# Patient Record
Sex: Male | Born: 1945 | Race: Black or African American | Hispanic: No | Marital: Married | Smoking: Former smoker
Health system: Southern US, Community
[De-identification: ages and names within clinical notes are randomized; demographics above are authoritative.]

## PROBLEM LIST (undated history)

## (undated) DIAGNOSIS — Z5189 Encounter for other specified aftercare: Secondary | ICD-10-CM

## (undated) DIAGNOSIS — N183 Chronic kidney disease, stage 3 unspecified: Secondary | ICD-10-CM

## (undated) DIAGNOSIS — Z87898 Personal history of other specified conditions: Secondary | ICD-10-CM

## (undated) DIAGNOSIS — J45909 Unspecified asthma, uncomplicated: Secondary | ICD-10-CM

## (undated) DIAGNOSIS — T7840XA Allergy, unspecified, initial encounter: Secondary | ICD-10-CM

## (undated) DIAGNOSIS — IMO0001 Reserved for inherently not codable concepts without codable children: Secondary | ICD-10-CM

## (undated) DIAGNOSIS — T8859XA Other complications of anesthesia, initial encounter: Secondary | ICD-10-CM

## (undated) DIAGNOSIS — K219 Gastro-esophageal reflux disease without esophagitis: Secondary | ICD-10-CM

## (undated) DIAGNOSIS — H269 Unspecified cataract: Secondary | ICD-10-CM

## (undated) DIAGNOSIS — C801 Malignant (primary) neoplasm, unspecified: Secondary | ICD-10-CM

## (undated) DIAGNOSIS — M1990 Unspecified osteoarthritis, unspecified site: Secondary | ICD-10-CM

## (undated) DIAGNOSIS — E291 Testicular hypofunction: Secondary | ICD-10-CM

## (undated) DIAGNOSIS — I10 Essential (primary) hypertension: Principal | ICD-10-CM

## (undated) DIAGNOSIS — T4145XA Adverse effect of unspecified anesthetic, initial encounter: Secondary | ICD-10-CM

## (undated) DIAGNOSIS — E119 Type 2 diabetes mellitus without complications: Secondary | ICD-10-CM

## (undated) HISTORY — DX: Encounter for other specified aftercare: Z51.89

## (undated) HISTORY — DX: Personal history of other specified conditions: Z87.898

## (undated) HISTORY — DX: Allergy, unspecified, initial encounter: T78.40XA

## (undated) HISTORY — DX: Essential (primary) hypertension: I10

## (undated) HISTORY — DX: Testicular hypofunction: E29.1

## (undated) HISTORY — DX: Unspecified cataract: H26.9

## (undated) HISTORY — DX: Reserved for inherently not codable concepts without codable children: IMO0001

## (undated) HISTORY — DX: Gastro-esophageal reflux disease without esophagitis: K21.9

## (undated) HISTORY — PX: PROSTATE BIOPSY: SHX241

## (undated) HISTORY — PX: COLONOSCOPY: SHX174

---

## 1995-10-07 HISTORY — PX: NASAL SINUS SURGERY: SHX719

## 1995-10-07 HISTORY — PX: INGUINAL HERNIA REPAIR: SUR1180

## 1998-03-19 ENCOUNTER — Emergency Department (HOSPITAL_COMMUNITY): Admission: AD | Admit: 1998-03-19 | Discharge: 1998-03-19 | Payer: Self-pay | Admitting: Emergency Medicine

## 1998-03-26 ENCOUNTER — Emergency Department (HOSPITAL_COMMUNITY): Admission: EM | Admit: 1998-03-26 | Discharge: 1998-03-26 | Payer: Self-pay | Admitting: Emergency Medicine

## 1998-04-14 ENCOUNTER — Emergency Department (HOSPITAL_COMMUNITY): Admission: EM | Admit: 1998-04-14 | Discharge: 1998-04-14 | Payer: Self-pay | Admitting: Emergency Medicine

## 2004-09-22 ENCOUNTER — Emergency Department (HOSPITAL_COMMUNITY): Admission: EM | Admit: 2004-09-22 | Discharge: 2004-09-22 | Payer: Self-pay | Admitting: Family Medicine

## 2005-05-11 ENCOUNTER — Emergency Department (HOSPITAL_COMMUNITY): Admission: EM | Admit: 2005-05-11 | Discharge: 2005-05-11 | Payer: Self-pay | Admitting: Emergency Medicine

## 2006-10-24 ENCOUNTER — Inpatient Hospital Stay (HOSPITAL_COMMUNITY): Admission: EM | Admit: 2006-10-24 | Discharge: 2006-10-25 | Payer: Self-pay | Admitting: Emergency Medicine

## 2006-10-24 IMAGING — CR DG CHEST 2V
2 series · 2 of 2 positions shown · non-contrast
Comparison: None

CLINICAL DATA: Left-sided numbness, chest pain

CHEST - 2 VIEW:

[view not recorded (1 of 2)]
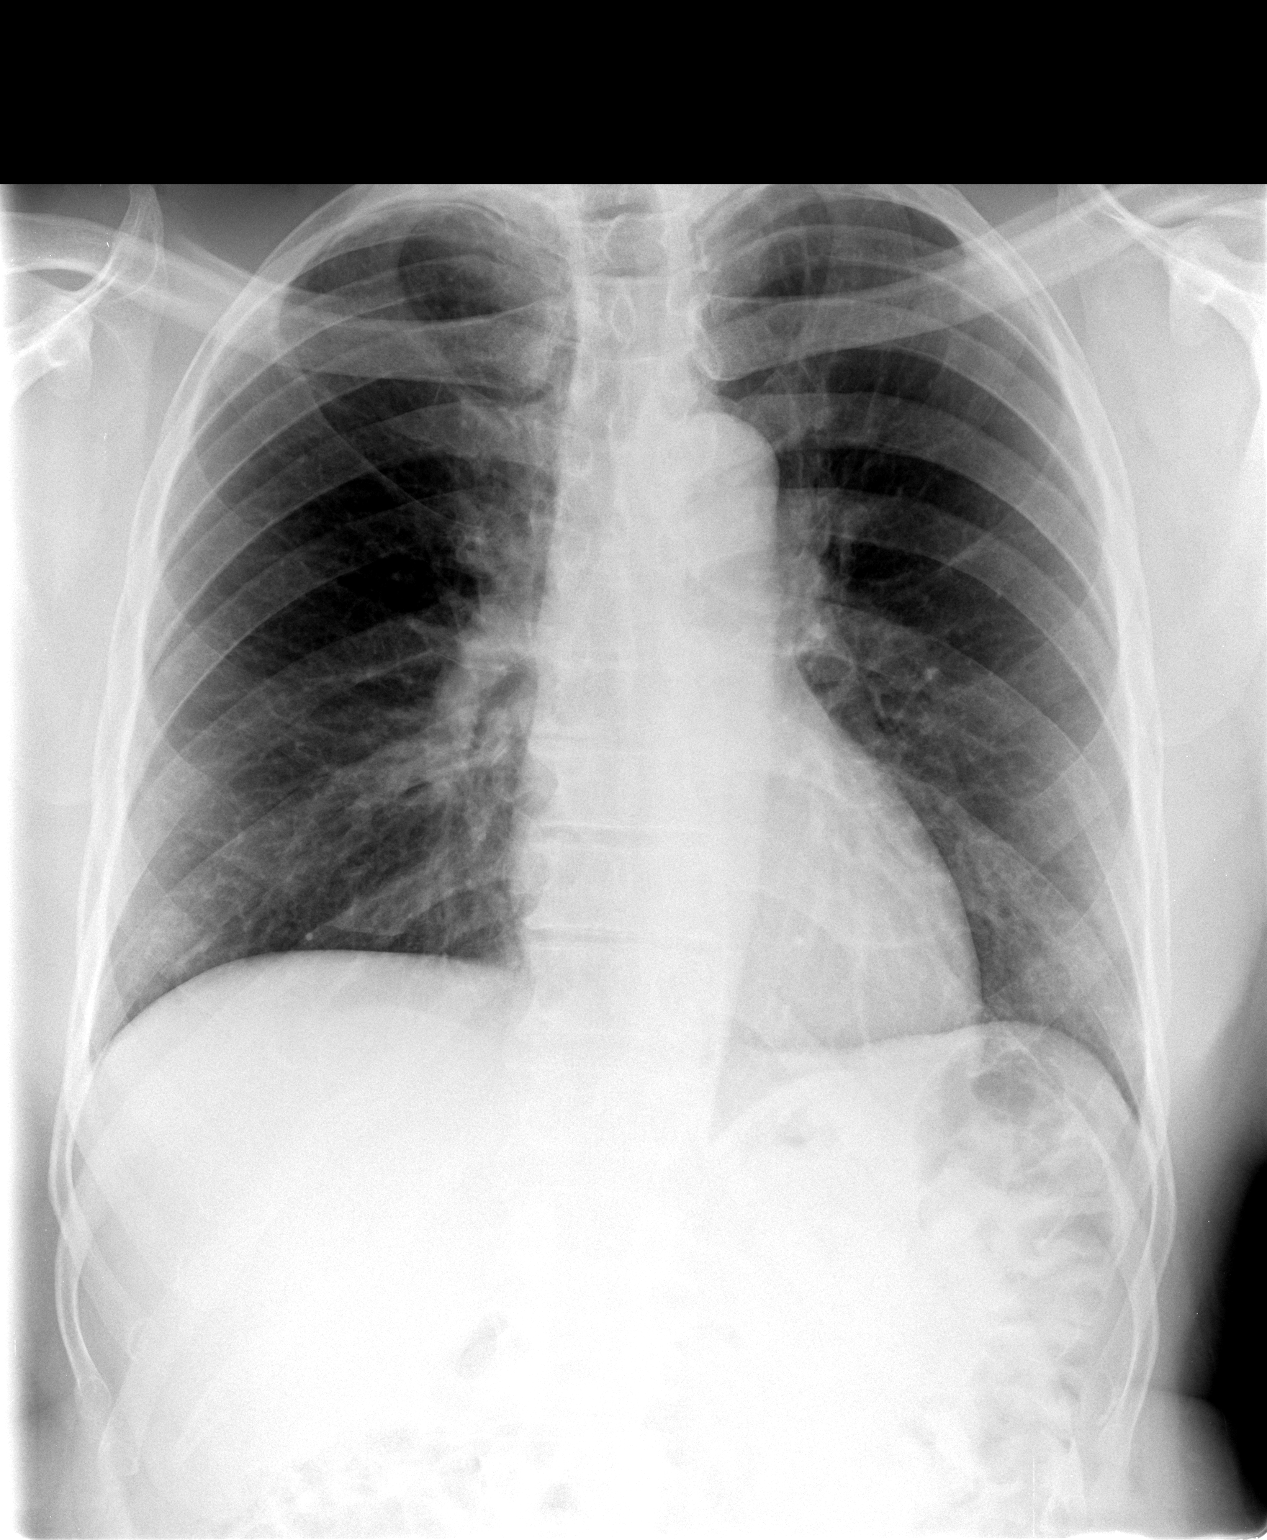

[view not recorded (2 of 2)]
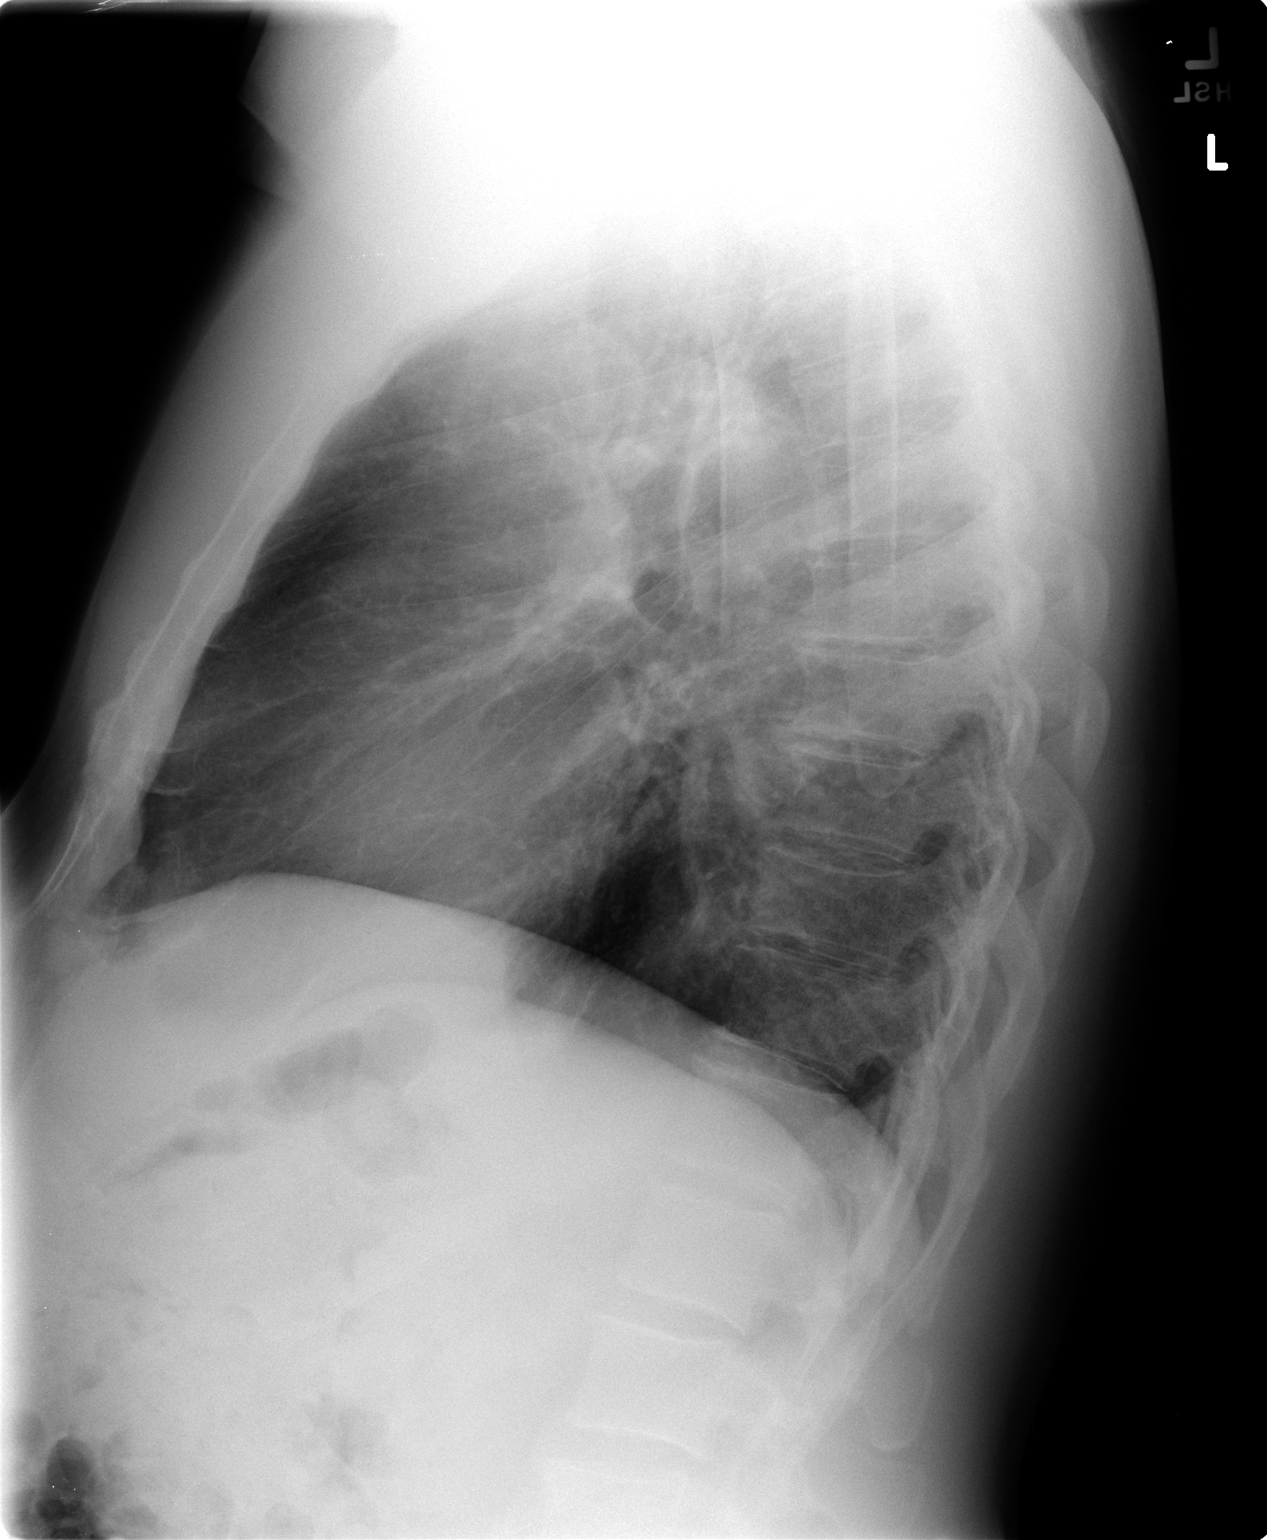

[2 of 2 positions shown; findings below may reference images not displayed]

FINDINGS: The heart size and mediastinal contours are within normal limits. 
Both lungs are clear.  The visualized skeletal structures are unremarkable.
IMPRESSION: No active cardiopulmonary disease

## 2007-12-13 ENCOUNTER — Encounter: Payer: Self-pay | Admitting: Family Medicine

## 2008-06-01 ENCOUNTER — Encounter: Payer: Self-pay | Admitting: Family Medicine

## 2008-07-10 ENCOUNTER — Emergency Department (HOSPITAL_COMMUNITY): Admission: EM | Admit: 2008-07-10 | Discharge: 2008-07-10 | Payer: Self-pay | Admitting: Emergency Medicine

## 2009-01-18 ENCOUNTER — Ambulatory Visit: Payer: Self-pay | Admitting: Family Medicine

## 2009-01-18 DIAGNOSIS — K219 Gastro-esophageal reflux disease without esophagitis: Secondary | ICD-10-CM | POA: Insufficient documentation

## 2009-01-18 DIAGNOSIS — E291 Testicular hypofunction: Secondary | ICD-10-CM

## 2009-01-18 DIAGNOSIS — I10 Essential (primary) hypertension: Secondary | ICD-10-CM | POA: Insufficient documentation

## 2009-01-18 DIAGNOSIS — J45909 Unspecified asthma, uncomplicated: Secondary | ICD-10-CM | POA: Insufficient documentation

## 2009-01-18 DIAGNOSIS — R7989 Other specified abnormal findings of blood chemistry: Secondary | ICD-10-CM | POA: Insufficient documentation

## 2009-01-18 HISTORY — DX: Testicular hypofunction: E29.1

## 2009-01-18 HISTORY — DX: Essential (primary) hypertension: I10

## 2009-01-18 HISTORY — DX: Gastro-esophageal reflux disease without esophagitis: K21.9

## 2009-01-18 LAB — CONVERTED CEMR LAB
Nitrite: NEGATIVE
Protein, U semiquant: NEGATIVE
Specific Gravity, Urine: 1.01

## 2009-02-08 ENCOUNTER — Ambulatory Visit: Payer: Self-pay | Admitting: Family Medicine

## 2009-02-08 LAB — CONVERTED CEMR LAB
Nitrite: NEGATIVE
Urobilinogen, UA: 1
WBC Urine, dipstick: NEGATIVE

## 2009-03-01 ENCOUNTER — Encounter: Payer: Self-pay | Admitting: Family Medicine

## 2009-04-04 ENCOUNTER — Ambulatory Visit: Payer: Self-pay | Admitting: Family Medicine

## 2009-04-04 DIAGNOSIS — Z87898 Personal history of other specified conditions: Secondary | ICD-10-CM

## 2009-04-04 DIAGNOSIS — N400 Enlarged prostate without lower urinary tract symptoms: Secondary | ICD-10-CM | POA: Insufficient documentation

## 2009-04-04 HISTORY — DX: Personal history of other specified conditions: Z87.898

## 2009-04-05 ENCOUNTER — Telehealth: Payer: Self-pay | Admitting: Family Medicine

## 2009-04-06 ENCOUNTER — Encounter: Payer: Self-pay | Admitting: Family Medicine

## 2009-04-10 ENCOUNTER — Telehealth (INDEPENDENT_AMBULATORY_CARE_PROVIDER_SITE_OTHER): Payer: Self-pay | Admitting: *Deleted

## 2009-04-21 IMAGING — CR DG FINGER MIDDLE 2+V*R*
3 series · 3 of 3 positions shown · non-contrast
Comparison: None.

CLINICAL DATA: Laceration middle finger.

RIGHT MIDDLE FINGER 2+V

[x finger pa right]
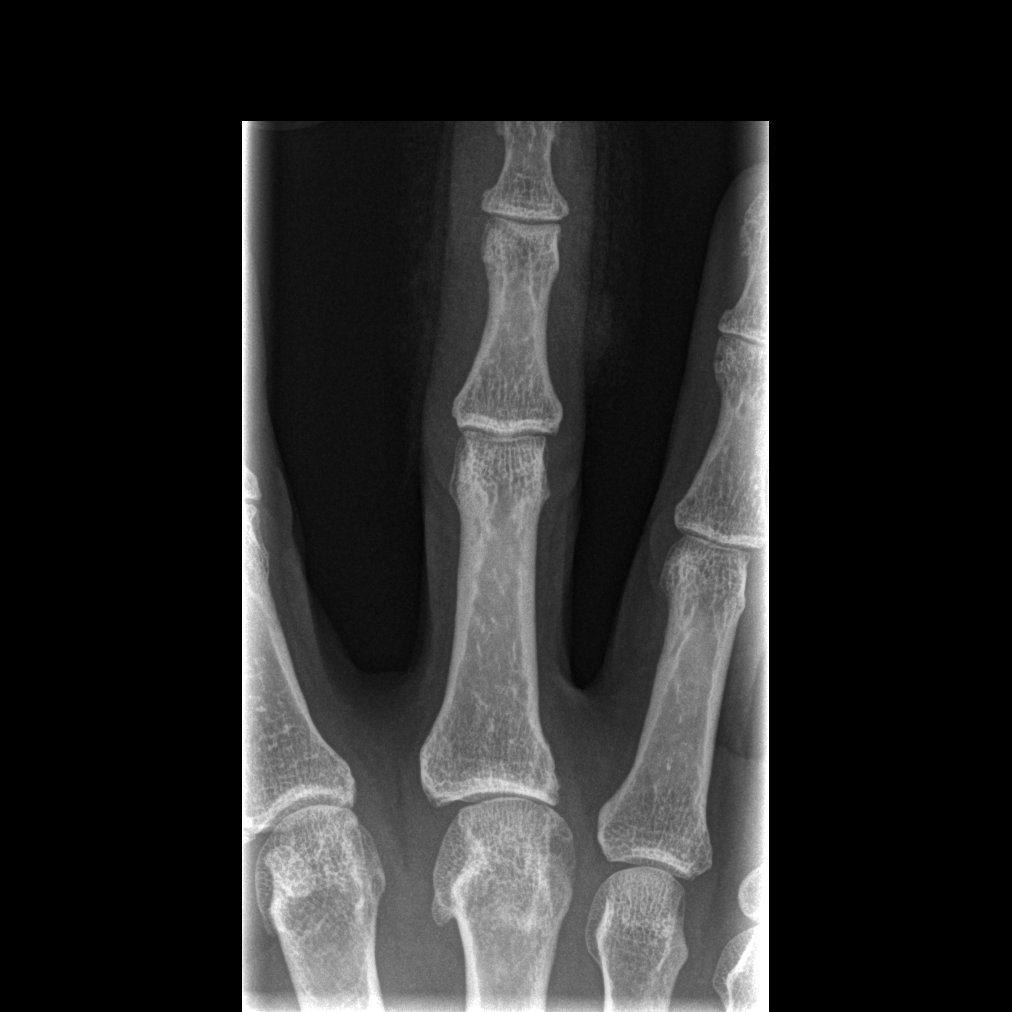

[x finger obl. right]
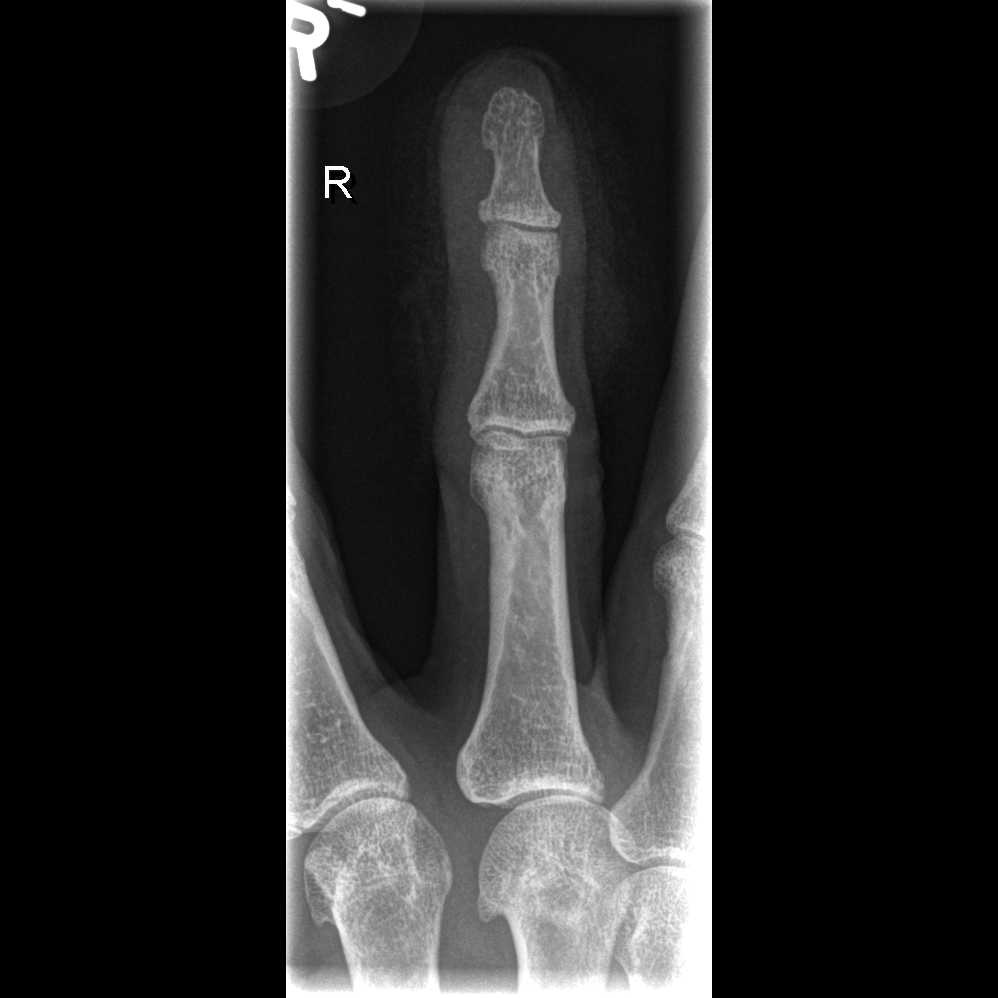

[x finger lateral right]
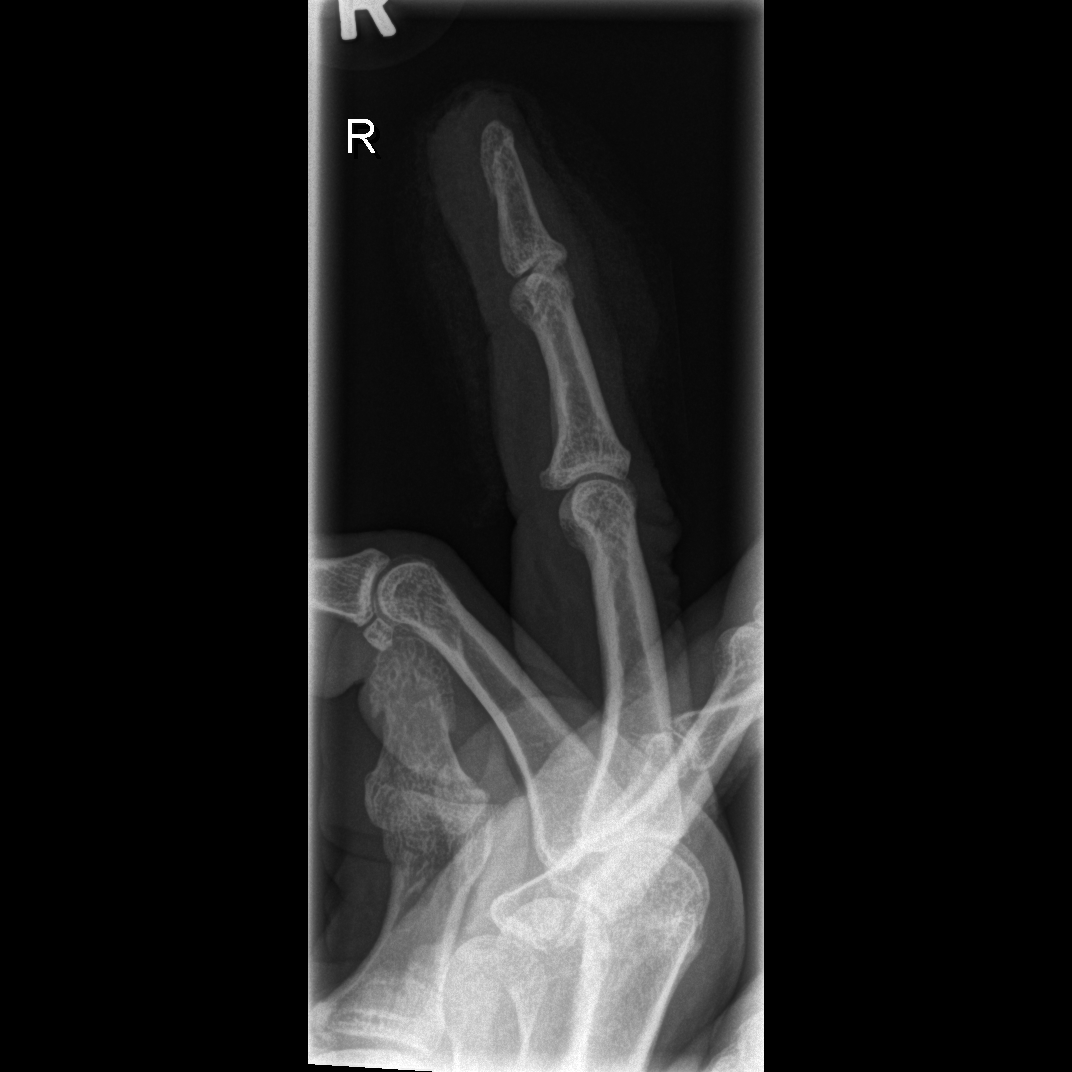

[3 of 3 positions shown; findings below may reference images not displayed]

FINDINGS: Three-view exam shows no evidence for acute fracture.  No
subluxation or dislocation.  Overlying bandage material is evident.
IMPRESSION: No acute bony findings.

## 2009-07-02 ENCOUNTER — Ambulatory Visit: Payer: Self-pay | Admitting: Family Medicine

## 2009-07-03 ENCOUNTER — Encounter: Payer: Self-pay | Admitting: Family Medicine

## 2009-07-20 ENCOUNTER — Ambulatory Visit: Payer: Self-pay | Admitting: Family Medicine

## 2009-07-25 ENCOUNTER — Telehealth (INDEPENDENT_AMBULATORY_CARE_PROVIDER_SITE_OTHER): Payer: Self-pay | Admitting: *Deleted

## 2009-08-20 ENCOUNTER — Ambulatory Visit: Payer: Self-pay | Admitting: Family Medicine

## 2009-09-10 ENCOUNTER — Telehealth: Payer: Self-pay | Admitting: Family Medicine

## 2009-09-11 ENCOUNTER — Ambulatory Visit: Payer: Self-pay | Admitting: Family Medicine

## 2009-10-09 ENCOUNTER — Encounter: Payer: Self-pay | Admitting: Family Medicine

## 2010-06-03 ENCOUNTER — Telehealth: Payer: Self-pay | Admitting: Family Medicine

## 2010-09-03 ENCOUNTER — Ambulatory Visit: Payer: Self-pay | Admitting: Family Medicine

## 2010-09-03 DIAGNOSIS — J309 Allergic rhinitis, unspecified: Secondary | ICD-10-CM | POA: Insufficient documentation

## 2010-09-06 ENCOUNTER — Telehealth: Payer: Self-pay | Admitting: Family Medicine

## 2010-09-14 ENCOUNTER — Emergency Department (HOSPITAL_COMMUNITY)
Admission: EM | Admit: 2010-09-14 | Discharge: 2010-09-14 | Disposition: A | Discharge status: Other Acute Inpatient Hospital | Payer: Self-pay | Source: Home / Self Care | Admitting: Family Medicine

## 2010-09-14 ENCOUNTER — Emergency Department (HOSPITAL_COMMUNITY)
Admission: EM | Admit: 2010-09-14 | Discharge: 2010-09-14 | Payer: Self-pay | Source: Home / Self Care | Admitting: Emergency Medicine

## 2010-09-16 ENCOUNTER — Emergency Department (HOSPITAL_COMMUNITY)
Admission: EM | Admit: 2010-09-16 | Discharge: 2010-09-16 | Payer: Self-pay | Source: Home / Self Care | Admitting: Emergency Medicine

## 2010-09-20 ENCOUNTER — Ambulatory Visit: Payer: Self-pay | Admitting: Family Medicine

## 2010-09-25 ENCOUNTER — Emergency Department (HOSPITAL_COMMUNITY)
Admission: EM | Admit: 2010-09-25 | Discharge: 2010-09-25 | Payer: Self-pay | Source: Home / Self Care | Admitting: Family Medicine

## 2010-10-09 ENCOUNTER — Ambulatory Visit
Admission: RE | Admit: 2010-10-09 | Discharge: 2010-10-09 | Payer: Self-pay | Source: Home / Self Care | Attending: Family Medicine | Admitting: Family Medicine

## 2010-10-09 DIAGNOSIS — J310 Chronic rhinitis: Secondary | ICD-10-CM | POA: Insufficient documentation

## 2010-10-09 DIAGNOSIS — R05 Cough: Secondary | ICD-10-CM | POA: Insufficient documentation

## 2010-10-09 DIAGNOSIS — R059 Cough, unspecified: Secondary | ICD-10-CM | POA: Insufficient documentation

## 2010-10-09 IMAGING — CR DG CHEST 2V
2 series · 2 of 2 positions shown · non-contrast
Comparison: Chest x-ray [DATE].

CLINICAL DATA: Cough.

CHEST - 2 VIEW

[view not recorded (1 of 2)]
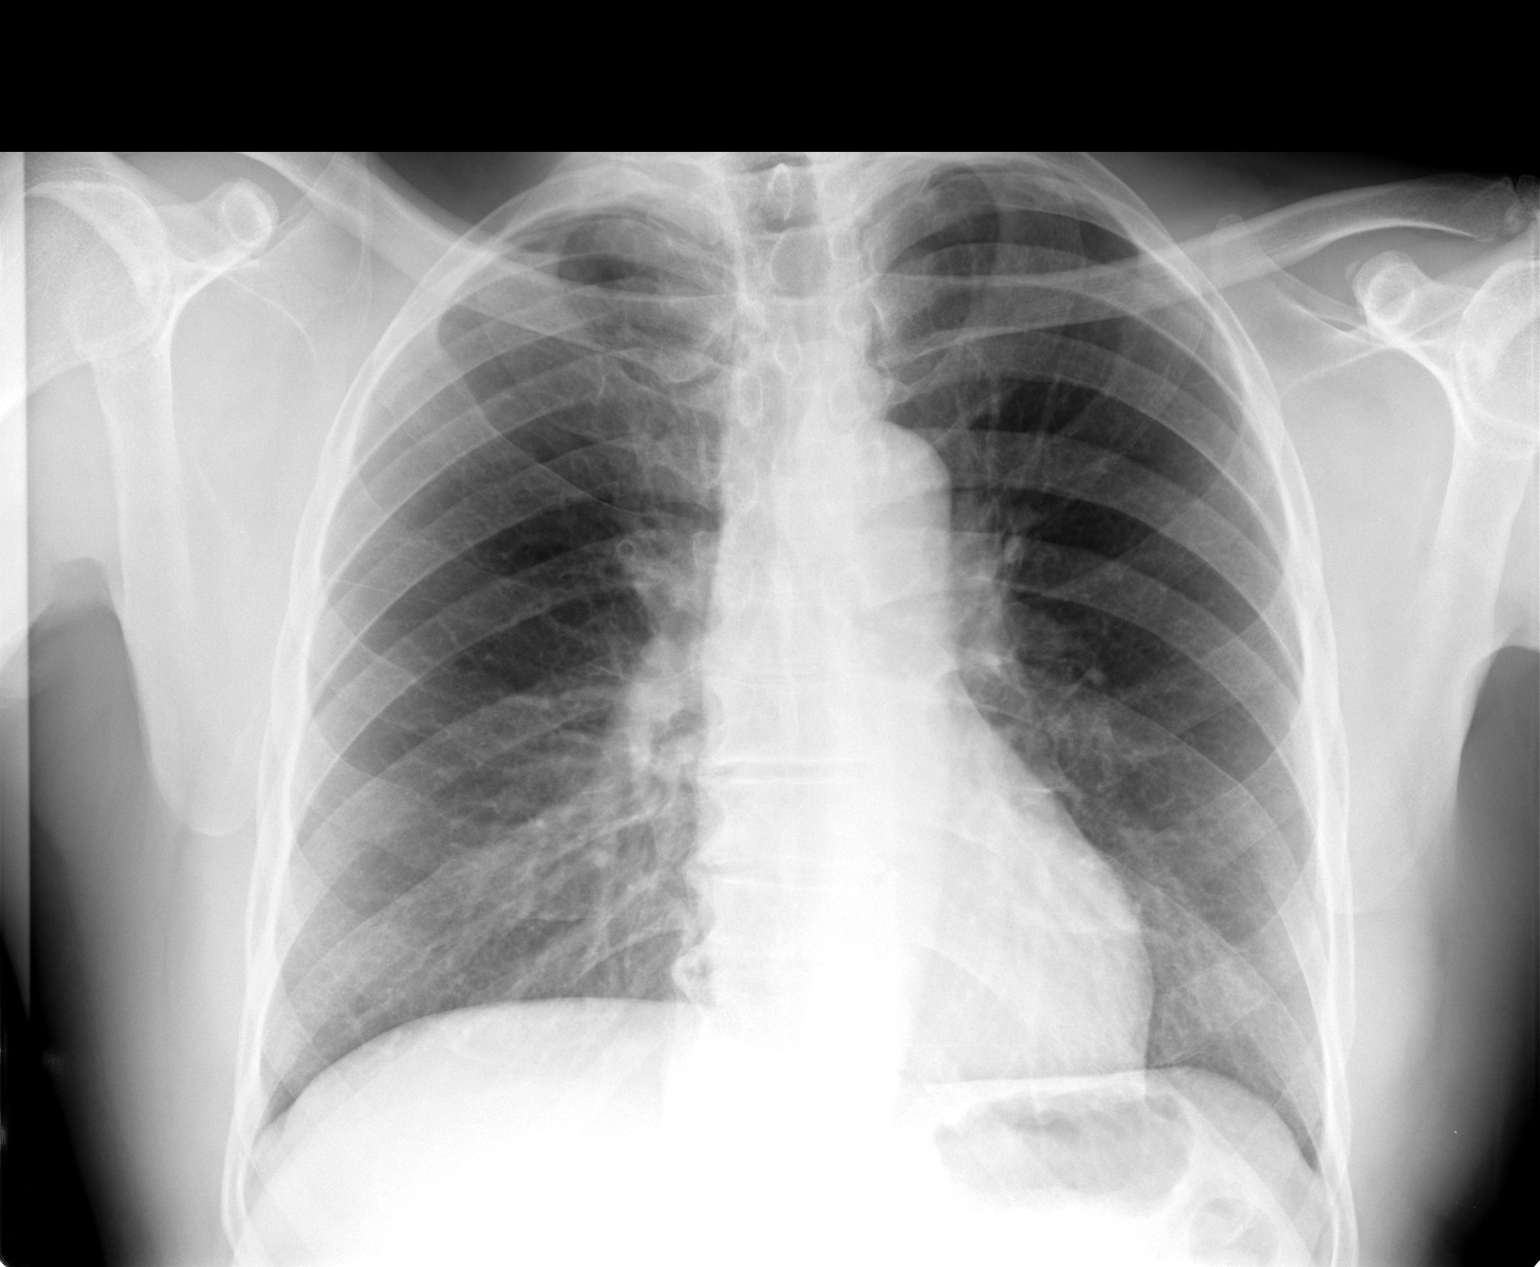

[view not recorded (2 of 2)]
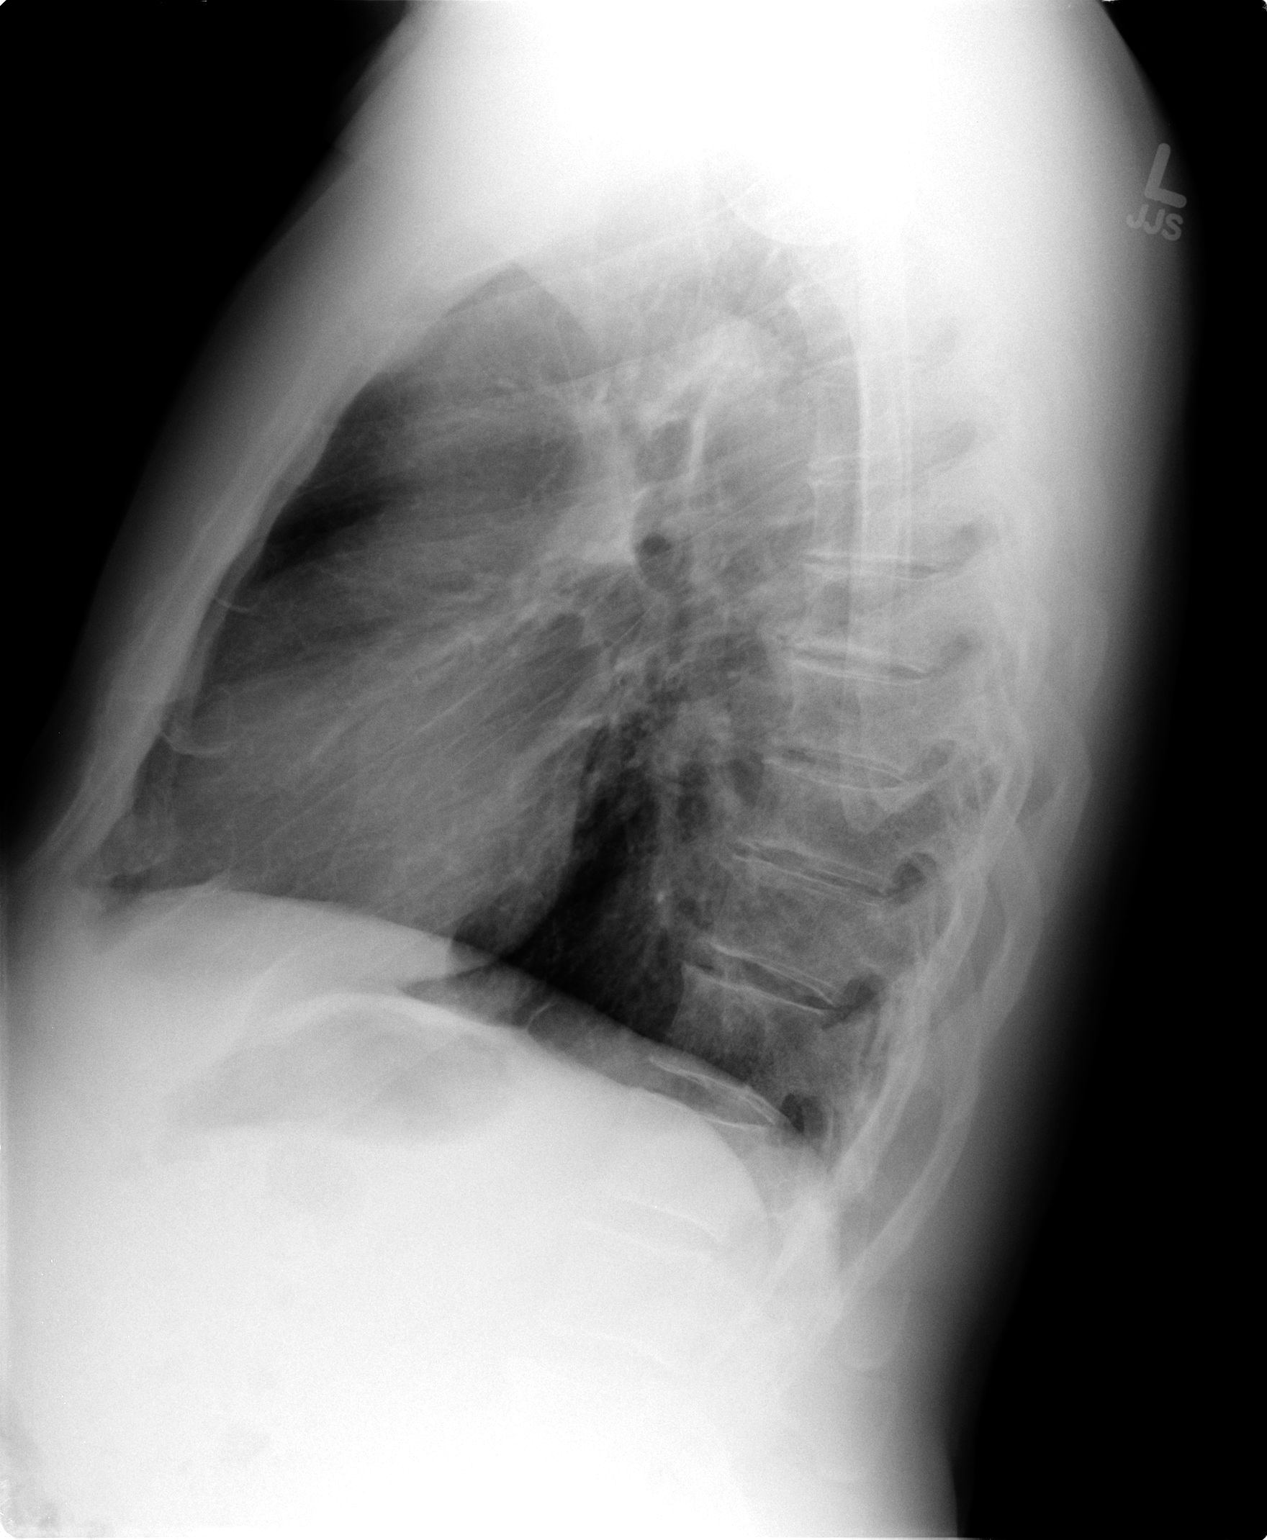

[2 of 2 positions shown; findings below may reference images not displayed]

FINDINGS: The cardiac silhouette, mediastinal and hilar contours
are within normal limits.  The lungs are clear.  No pleural
effusion.  The bony thorax is intact.
IMPRESSION: No acute cardiopulmonary findings.

## 2010-10-21 ENCOUNTER — Ambulatory Visit: Payer: Self-pay | Admitting: Cardiology

## 2010-10-21 IMAGING — CT CT PARANASAL SINUSES LIMITED
1 series · 15 of 30 positions shown, 19 images · non-contrast
Comparison: None.

CLINICAL DATA: 65-year-old male with loss of smell and face.  Sinus
pressure times 3 months.

CT PARANASAL SINUS LIMITED WITHOUT CONTRAST
TECHNIQUE: Multidetector CT images of the paranasal sinuses were
obtained in a single plane without contrast.

[Series 3: ltd sinus 3.0 h30s · axial · 0.23mm/px · z∈[-88,+38]mm · 15 of 30 slices shown, 19 images]
[im 2/30  brain]
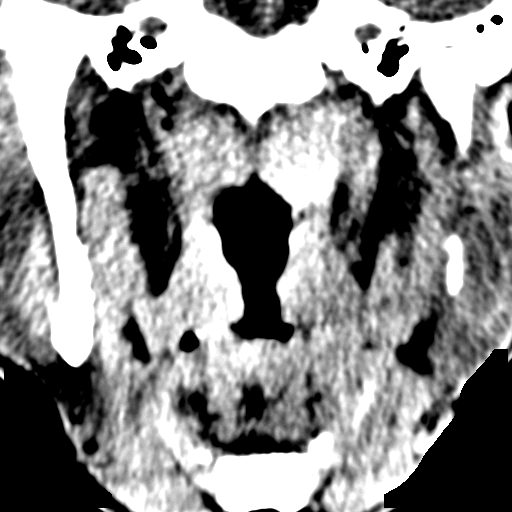
[im 2/30  bone]
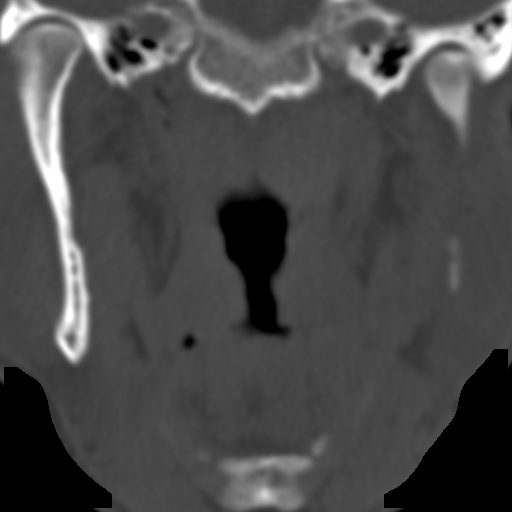
[im 4/30  bone]
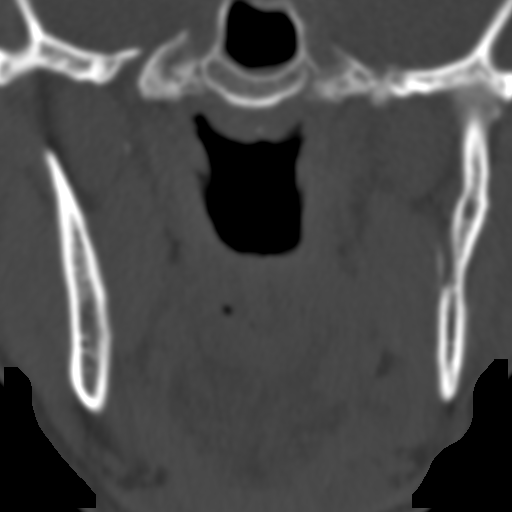
[im 6/30  bone]
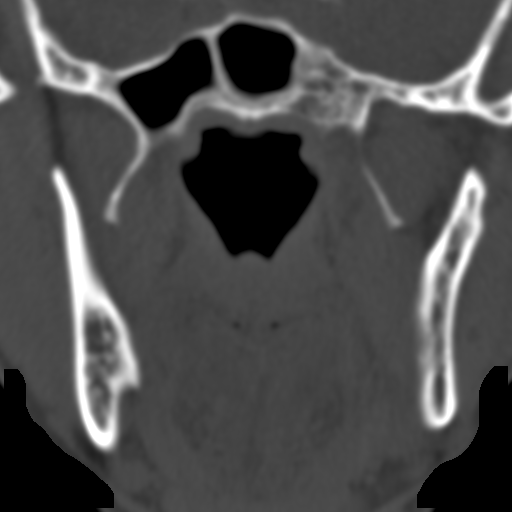
[im 8/30  bone]
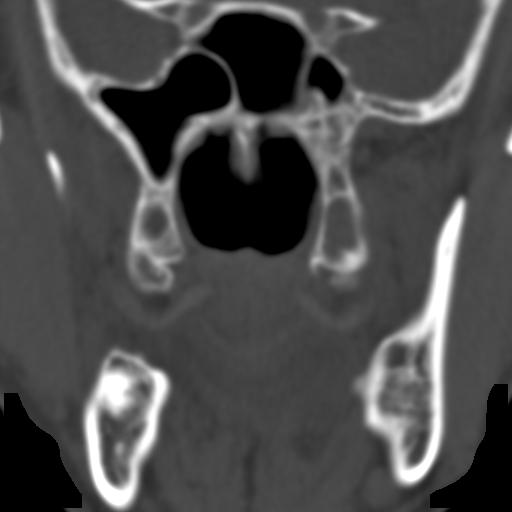
[im 10/30  brain]
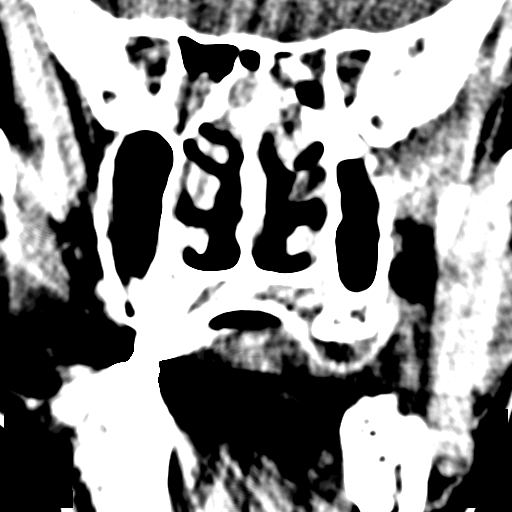
[im 10/30  bone]
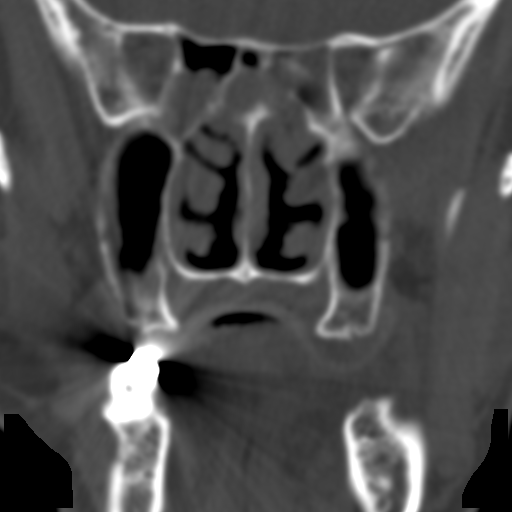
[im 12/30  bone]
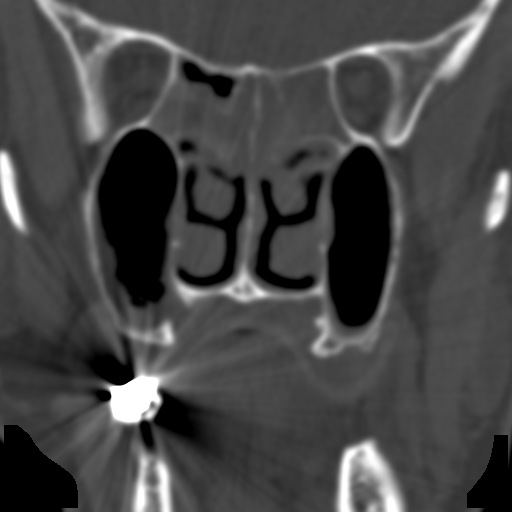
[im 14/30  bone]
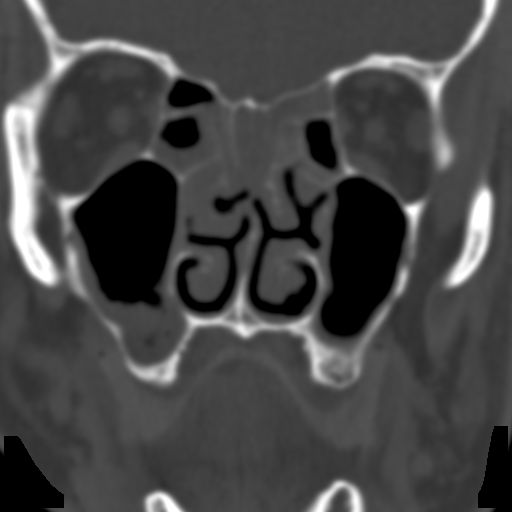
[im 16/30  bone]
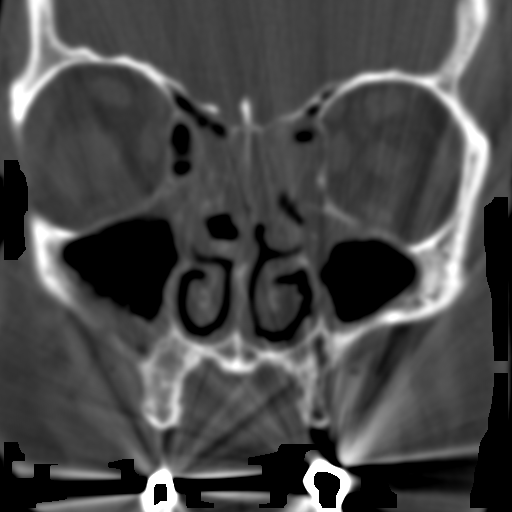
[im 17/30  brain]
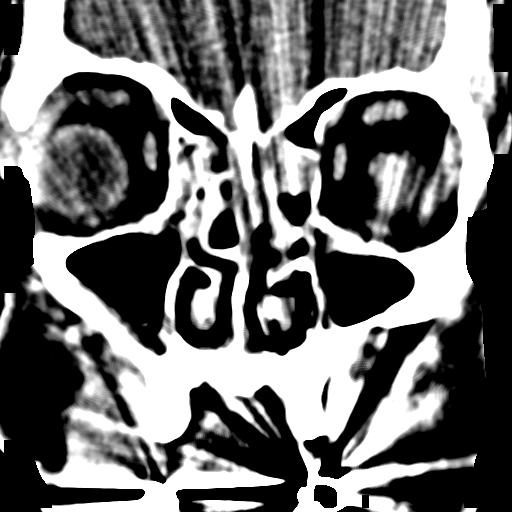
[im 17/30  bone]
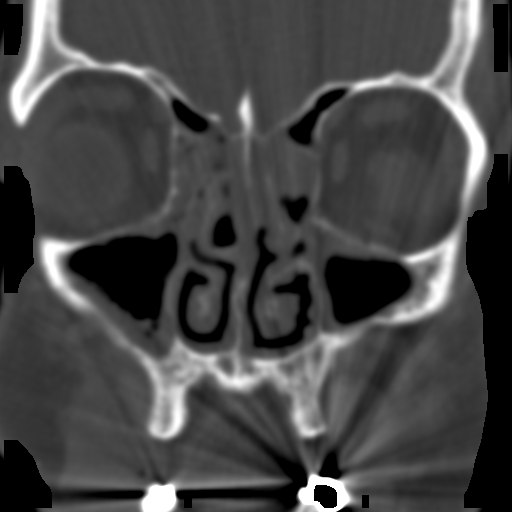
[im 19/30  bone]
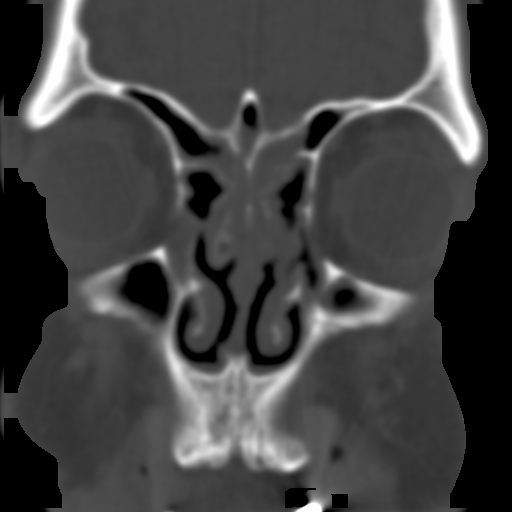
[im 21/30  bone]
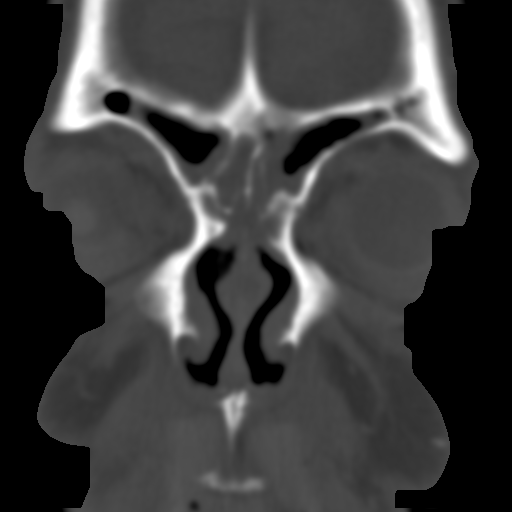
[im 23/30  bone]
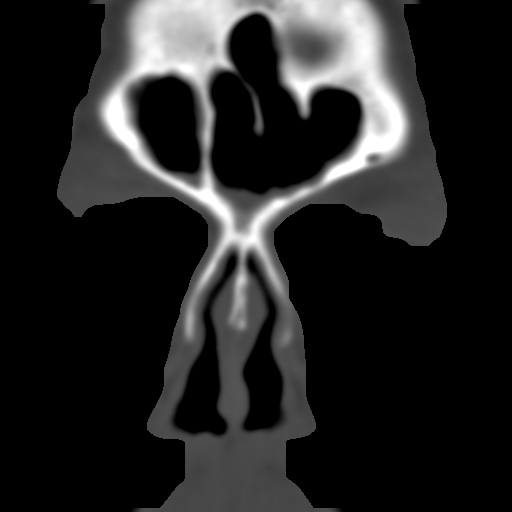
[im 25/30  brain]
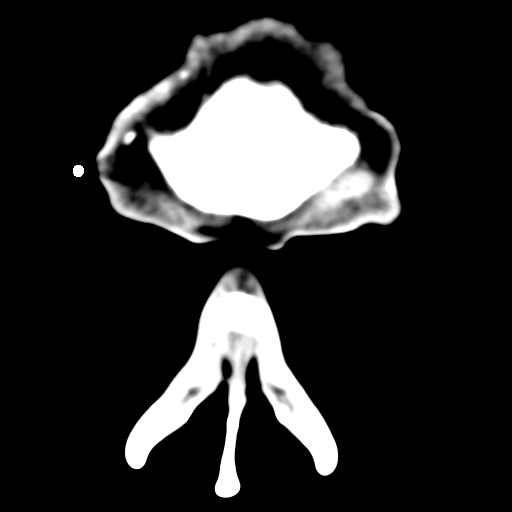
[im 25/30  bone]
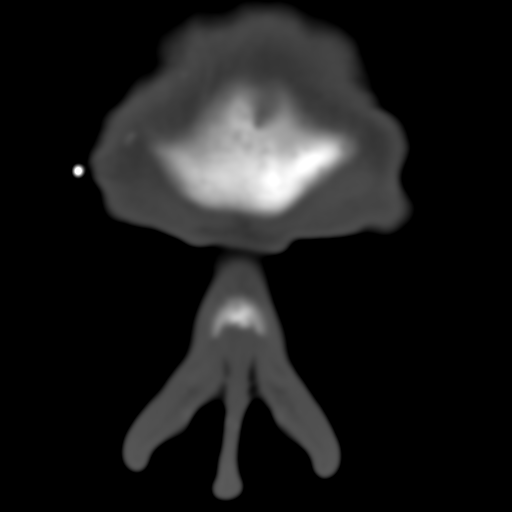
[im 27/30  bone]
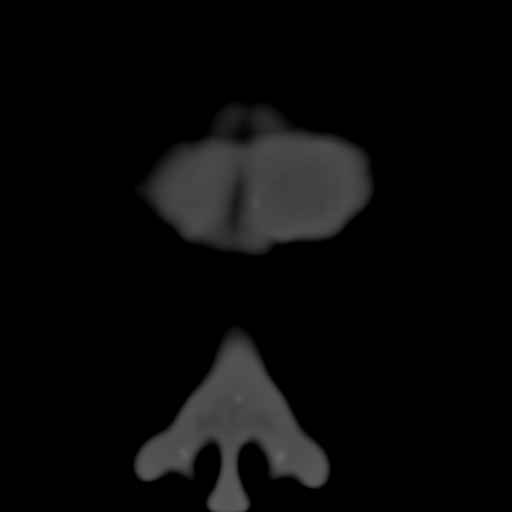
[im 29/30  bone]
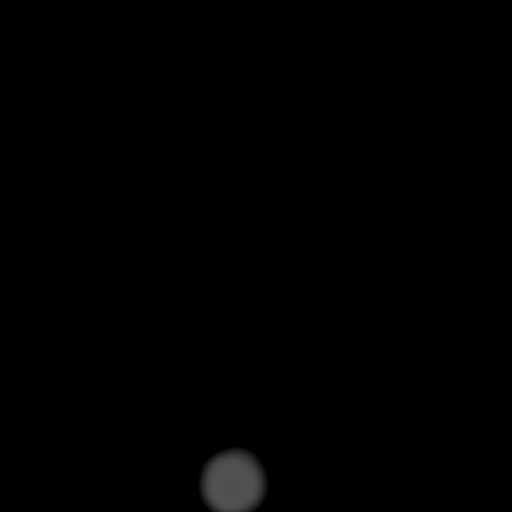

[15 of 30 positions shown; findings below may reference images not displayed]

FINDINGS: There is bubbly opacity and mucosal thickening in the
right maxillary sinus.  There is mucosal thickening in the left
maxillary sinus.  There is minimal mucosal thickening in the
sphenoid sinuses.  There is bubbly opacity and mucosal thickening
in the left frontal sinus.  There is mucosal thickening in the
right frontal sinus.

The ethmoid air cells are largely opacified throughout.  There is
opacification  of the bilateral olfactory recesses.

The nasal cavity is within normal limits.  Visualized deep and
superficial face soft tissues are within normal limits.

No acute osseous abnormality identified.  Grossly negative
visualized noncontrast brain parenchyma.
IMPRESSION: Diffuse paranasal sinus inflammatory changes with bubbly opacity in
the left frontal and right maxillary sinuses suggestive of acute
sinusitis.  The appearance may reflect acute on chronic sinus
disease.

## 2010-10-22 ENCOUNTER — Telehealth: Payer: Self-pay | Admitting: Family Medicine

## 2010-10-23 ENCOUNTER — Telehealth (INDEPENDENT_AMBULATORY_CARE_PROVIDER_SITE_OTHER): Payer: Self-pay | Admitting: *Deleted

## 2010-10-26 ENCOUNTER — Encounter: Payer: Self-pay | Admitting: Urology

## 2010-11-05 NOTE — Progress Notes (Signed)
Summary: Rx chg request  Phone Note Call from Patient Call back at Work Phone [phone]   Caller: Patient Call For: Carolann Littler MD Summary of Call: VM from pt requesting a change from Viagra to [place] Initial call taken by: Nira Conn LPN,  August 29, 624THL 1:32 PM  Follow-up for Phone Call        OK to change to Levitra 20 mg one by mouth once daily as needed #6 with 6 refills. Follow-up by: Carolann Littler MD,  June 03, 2010 3:22 PM  Additional Follow-up for Phone Call Additional follow up Details #1::        Rx sent, pt informed Additional Follow-up by: Nira Conn LPN,  August 29, 624THL 4:33 PM    New/Updated Medications: LEVITRA 20 MG TABS (VARDENAFIL HCL) as needed LEVITRA 20 MG TABS (VARDENAFIL HCL) one tab by mouth daily as needed Prescriptions: LEVITRA 20 MG TABS (VARDENAFIL HCL) as needed  #6 x 6   Entered by:   Nira Conn LPN   Authorized by:   Carolann Littler MD   Signed by:   Nira Conn LPN on [id number]   Method used:   Electronically to        [facility]  [phone]* (retail)       [address]       [place], [place]  [address]       Ph: [phone] or [phone]       Fax: [phone]   RxID:   [id number]

## 2010-11-05 NOTE — Consult Note (Signed)
Summary: [facility]  [facility]   Imported By: Laural Benes 10/15/2009 10:40:51  _____________________________________________________________________  External Attachment:    Type:   Image     Comment:   External Document

## 2010-11-05 NOTE — Assessment & Plan Note (Signed)
Summary: MEDICATION CONCERNS / TINEA PEDIS (ATHLETES FOOT) // RS   Vital Signs:  Patient profile:   65 year old male Weight:      198 pounds O2 Sat:      96 % on Room air Temp:     98.5 degrees F oral BP sitting:   158 / 92  (left arm) Cuff size:   regular  Vitals Entered By: Nira Conn LPN (November 29, 624THL 11:16 AM)  O2 Flow:  Room air CC: BP running high, SOB   History of Present Illness: Patient seen for the following concerns:  Hypertension poorly controlled with lisinopril. Previously on hydrochlorothiazide but had potential side effect. He is reluctant to go back on that. Blood pressures ranging [id number] range systolic and 0000000 diastolic. No headaches or dizziness. No chest pain.  Frequent nasal congestive symptoms for several weeks with loss of smell and taste.  Freq. postnasal drip symptoms. No purlulent secretions.  History of BPH. Followed by urology with prior history elevated PSA.  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  Otoscopic examination reveals clear canals, tympanic membranes are intact bilaterally without bulging, retraction, inflammation or discharge. Hearing is grossly normal bilaterally. Nose:  swollen nasal mucosa bilaterally. No visible polyps Mouth:  Oral mucosa and  oropharynx without lesions or exudates.  Teeth in good repair. Neck:  No deformities, masses, or tenderness noted. Lungs:  Normal respiratory effort, chest expands symmetrically. Lungs are clear to auscultation, no crackles or wheezes. Heart:  normal rate and regular rhythm.   Extremities:  no edema   Impression & Recommendations:  Problem # 1:  HYPERTENSION (ICD-401.9) Assessment Deteriorated change from lisinopril to lotrel and reassess one month. The following medications were removed from the medication list:    Lisinopril 20 Mg Tabs (Lisinopril) ..... One by mouth once daily His updated medication list for this problem includes:    Lotrel 5-10 Mg Caps (Amlodipine besy-benazepril hcl) ..... One by mouth once daily  Problem # 2:  GERD (ICD-530.81)  Problem # 3:  ALLERGIC RHINITIS (ICD-477.9) trial of Flonase. His updated medication list for this problem includes:    Fluticasone Propionate 50 Mcg/act Susp (Fluticasone propionate) .Marland Kitchen... 2 sprays per nostril once daily  Complete Medication List: 1)  Testosterone  .... Inject 1cc monthly 2)  Testosterone Cypionate 200 Mg/ml Oil (Testosterone cypionate) .Marland Kitchen.. 1 cc im q month 3)  Levitra 20 Mg Tabs (Vardenafil hcl) .... One tab by mouth daily as needed 4)  Avodart 0.5 Mg Caps (Dutasteride) .... One by mouth once daily 5)  Fluticasone Propionate 50 Mcg/act Susp (Fluticasone propionate) .... 2 sprays per nostril once daily 6)  Lotrel 5-10 Mg Caps (Amlodipine besy-benazepril hcl) .... One by mouth once daily  Patient Instructions: 1)  Please schedule a follow-up appointment in 1 month.  Prescriptions: LOTREL 5-10 MG CAPS (AMLODIPINE BESY-BENAZEPRIL HCL) one by mouth once daily  #30 x 6   Entered and Authorized by:   Carolann Littler MD   Signed by:  Carolann Littler MD on 09/03/2010   Method used:   Electronically to        [facility]  [phone]* (retail)       [address]       [place], [place]  [address]       Ph: [phone] or [phone]       Fax: [phone]   RxID:   [id number] FLUTICASONE PROPIONATE 50 MCG/ACT SUSP (FLUTICASONE PROPIONATE) 2 sprays per nostril once daily  #1 x 6   Entered and Authorized by:   Carolann Littler MD   Signed by:   Carolann Littler MD on 09/03/2010   Method used:   Electronically to        [facility]  [phone]* (retail)       [address]       [place], [place]  [address]       Ph: [phone] or [phone]       Fax: [phone]   RxID:   [id number]    Orders Added: 1)  Est. Patient Level III [id number]

## 2010-11-05 NOTE — Progress Notes (Signed)
Summary: Nexium request  Phone Note Call from Patient Call back at Home Phone [phone]   Caller: Patient Call For: Carolann Littler MD Summary of Call: Pt requesting to go back on Nexium, he is having acid reflux flare up.  He thought he discussed with Dr Elease Hashimoto at [facility] last week.  He has tried all the OTC Prilosec with little relief.  He can wait for Dr Erick Blinks return [facility] Initial call taken by: Nira Conn LPN,  December  2, 624THL 3:55 PM  Follow-up for Phone Call        OK to start Nexium 40 mg by mouth once daily and refill for 6 months. Follow-up by: Carolann Littler MD,  September 08, 2010 9:01 PM  Additional Follow-up for Phone Call Additional follow up Details #1::        Rx filled, pt informed    New/Updated Medications: NEXIUM 40 MG CPDR (ESOMEPRAZOLE MAGNESIUM) once daily Prescriptions: NEXIUM 40 MG CPDR (ESOMEPRAZOLE MAGNESIUM) once daily  #90 x 1   Entered by:   Nira Conn LPN   Authorized by:   Carolann Littler MD   Signed by:   Nira Conn LPN on [id number]   Method used:   Electronically to        [facility]  [phone]* (retail)       [address]       [place], [place]  [address]       Ph: [phone] or [phone]       Fax: [phone]   RxID:   [id number]

## 2010-11-07 NOTE — Assessment & Plan Note (Signed)
Summary: Cough/cb/pt rescd from bump//ccm   Vital Signs:  Patient profile:   66 year old male Weight:      191 pounds Temp:     98.8 degrees F oral BP sitting:   120 / 90  (left arm) Cuff size:   regular  Vitals Entered By: Nira Conn LPN (January  4, [id number] 10:26 AM)  History of Present Illness: Seen for the following:  Cough for several weeks.  See below for details.  Over 6 week hx of loss of smell and  taste with persistent nasal congestion not relieved with Flonase.  Went to ER with  cough and Tessalon Perles rxed with some relief of cough.  Nonsmoker  Ongoing issues of slow stream and nocturia.  On Avodart for several months and has seen urology for this in past.  Hx prior elev PSA with no evidence for cancer.  No burning with urination, fever, or chills.   Cough      This is a 65 year old man who presents with Cough.  The patient reports non-productive cough, but denies productive cough, pleuritic chest pain, shortness of breath, wheezing, exertional dyspnea, fever, and hemoptysis.  Associated symtpoms include nasal congestion, chronic rhinitis, and weight loss.  The patient denies the following symptoms: sore throat, acid reflux symptoms, and peripheral edema.  Ineffective prior treatments have included OTC cough medication.    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    Physical Exam  General:  Well-developed,well-nourished,in no acute distress; alert,appropriate and cooperative throughout examination Ears:  External ear exam shows no significant lesions or deformities.  Otoscopic examination reveals clear canals, tympanic membranes are intact bilaterally without bulging, retraction, inflammation or discharge. Hearing is grossly normal bilaterally. Nose:  swollen nasal mucosa with no purulent secretions. Mouth:  Oral mucosa and oropharynx without lesions or exudates.  Teeth in good repair. Neck:  No deformities, masses, or tenderness noted. Lungs:  Normal respiratory effort, chest expands symmetrically. Lungs are clear to auscultation, no crackles or wheezes. Heart:  normal rate and regular rhythm.   Extremities:  No clubbing, cyanosis, edema, or deformity noted with normal full range of motion of all joints.     Impression & Recommendations:  Problem # 1:  BENIGN PROSTATIC HYPERTROPHY, HX OF (ICD-V13.8) Assessment Deteriorated Rec pt f/u with urologist.  May need to consider add back Flomax.  Problem # 2:  COUGH (ICD-786.2) Assessment: Deteriorated ?chronic sinusitis.  ?allergic but no response to Flonase or antihistamines.  Set up CT sinuses with ongoing loss of taste and smell and Augmentin.  ENT referral if no better with the above.  CXR/. Orders: Radiology Referral (Radiology) T-2 View CXR (71020TC)  Complete Medication List: 1)  Testosterone  .... Inject 1cc monthly 2)  Testosterone Cypionate 200 Mg/ml Oil (Testosterone cypionate) .Marland Kitchen.. 1 cc im q month 3)  Levitra 20 Mg Tabs (Vardenafil hcl) .... One tab by mouth daily as needed 4)  Avodart  0.5 Mg Caps (Dutasteride) .... One by mouth once daily 5)  Benzonatate 200 Mg Caps (Benzonatate) .... One by mouth q 8 hours as needed cough 6)  Lotrel 5-10 Mg Caps (Amlodipine  besy-benazepril hcl) .... One by mouth once daily 7)  Nexium 40 Mg Cpdr (Esomeprazole magnesium) .... Once daily 8)  Amoxicillin-pot Clavulanate 875-125 Mg Tabs (Amoxicillin-pot clavulanate) .... One by mouth two times a day with food for 10 days  Patient Instructions: 1)  Set up urology appt. Prescriptions: AMOXICILLIN-POT CLAVULANATE 875-125 MG TABS (AMOXICILLIN-POT CLAVULANATE) one by mouth two times a day with food for 10 days  #20 x 0   Entered and Authorized by:   Carolann Littler MD   Signed by:   Carolann Littler MD on 10/09/2010   Method used:   Electronically to        [facility]  [phone]* (retail)       [address]       [place], [place]  [address]       Ph: [phone] or [phone]       Fax: [phone]   RxID:   [id number] BENZONATATE 200 MG CAPS (BENZONATATE) one by mouth q 8 hours as needed cough  #30 x 1   Entered and Authorized by:   Carolann Littler MD   Signed by:   Carolann Littler MD on 10/09/2010   Method used:   Electronically to        [facility]  [phone]* (retail)       [address]       [place], [place]  [address]       Ph: [phone] or [phone]       Fax: [phone]   RxID:   [id number]    Orders Added: 1)  Radiology Referral [Radiology] 2)  T-2 View CXR [71020TC] 3)  Est. Patient Level IV [id number]

## 2010-11-07 NOTE — Progress Notes (Signed)
  Spoke w/ Pt on 1/17.Marland KitchenMarland Kitchencopy of CT put on CD he will pick up 1/18 after 2pm [facility]  October 23, 2010 10:40 AM

## 2010-11-07 NOTE — Progress Notes (Signed)
Summary: REQUEST FOR REFERRAL (ENT)  Phone Note Call from Patient   Caller: Patient  [phone] Summary of Call: Pt called and adv that the abx (augmentin) didn't help w/ sxs of sinusitis..... Would like referral to ENT.... Pt can be reached at [phone].  Initial call taken by: Duanne Moron,  October 22, 2010 9:20 AM  Follow-up for Phone Call        done Follow-up by: Carolann Littler MD,  October 22, 2010 9:55 AM

## 2010-11-14 ENCOUNTER — Telehealth: Payer: Self-pay | Admitting: *Deleted

## 2010-11-14 DIAGNOSIS — E291 Testicular hypofunction: Secondary | ICD-10-CM

## 2010-11-14 MED ORDER — TESTOSTERONE CYPIONATE 200 MG/ML IM SOLN: 200.0000 mg | INTRAMUSCULAR | Status: AC

## 2010-11-14 NOTE — Telephone Encounter (Signed)
Pt requesting refill of Testosterone, send to [facility] called in

## 2010-11-18 ENCOUNTER — Ambulatory Visit (INDEPENDENT_AMBULATORY_CARE_PROVIDER_SITE_OTHER): Payer: Medicare PPO | Admitting: Family Medicine

## 2010-11-18 ENCOUNTER — Encounter: Payer: Self-pay | Admitting: Family Medicine

## 2010-11-18 DIAGNOSIS — K219 Gastro-esophageal reflux disease without esophagitis: Secondary | ICD-10-CM

## 2010-11-18 DIAGNOSIS — R05 Cough: Secondary | ICD-10-CM

## 2010-11-18 DIAGNOSIS — R059 Cough, unspecified: Secondary | ICD-10-CM

## 2010-11-18 DIAGNOSIS — I10 Essential (primary) hypertension: Secondary | ICD-10-CM

## 2010-11-18 MED ORDER — AMLODIPINE BESYLATE 5 MG PO TABS: 5.0000 mg | ORAL_TABLET | Freq: Every day | ORAL | Status: DC

## 2010-11-18 MED ORDER — LOSARTAN POTASSIUM 50 MG PO TABS: 50.0000 mg | ORAL_TABLET | Freq: Every day | ORAL | Status: DC

## 2010-11-18 NOTE — Patient Instructions (Signed)
Start Qvar one puff twice daily  Discontinue amlodipine and benazepril combination pill  Start new blood pressure medications  Continue Nexium and may continue supplemental omeprazole  Elevate head of bed 8-10 inches  Be in touch if cough not improving over the next couple of weeks

## 2010-11-18 NOTE — Progress Notes (Signed)
  Subjective:    Patient ID: Jimmy Rice, male    DOB: Jan 08, 1946, 65 y.o.   MRN: [id number]  HPI  Patient is seen with a chronic cough for several months duration. Chest x-ray unremarkable. Recently went to [facility] nose and throat specialist and placed on prednisone an antibiotic and had nasal laryngoscopy.  His cough did improve and sense of taste back after steroids.  Has nasal polyps. Did see some temporary improvement on prednisone.   Patient is a nonsmoker. Had several months of cough and has history of known GERD symptoms. Still has frequent sour taste in mouth. Has been sleeping sitting up. Taking Nexium 20 mg daily along with omeprazole 1-2 tablets daily. He is aware of some intermittent wheezing.    patient takes benazepril for hypertension and has been on this for several years. No prior history of ACE inhibitor associated cough. Patient is taking great care to avoid mentholated products and caffeine.  Pt has concerns whether hiatal hernia contributing to his cough.   Review of Systems  Constitutional: Negative for fever, chills, activity change, appetite change, fatigue and unexpected weight change.  HENT: Negative for ear pain, congestion, sore throat and trouble swallowing.   Respiratory: Positive for cough. Negative for shortness of breath, wheezing and stridor.   Cardiovascular: Negative for chest pain and leg swelling.  Gastrointestinal: Negative for abdominal pain.  Musculoskeletal: Negative for arthralgias.  Skin: Negative for rash.  Neurological: Negative for syncope and headaches.  Hematological: Negative for adenopathy.       Objective:   Physical Exam  Constitutional: He is oriented to person, place, and time. He appears well-developed and well-nourished. No distress.  HENT:  Head: Normocephalic and atraumatic.  Eyes: Pupils are equal, round, and reactive to light.  Neck: Normal range of motion. Neck supple. No thyromegaly present.  Cardiovascular: Normal rate  and regular rhythm.   No murmur heard. Pulmonary/Chest: Effort normal. No stridor. No respiratory distress. He has wheezes. He has no rales.  Abdominal: Soft. Bowel sounds are normal. There is no tenderness.  Musculoskeletal: He exhibits no edema.  Lymphadenopathy:    He has no cervical adenopathy.  Neurological: He is alert and oriented to person, place, and time.  Psychiatric: He has a normal mood and affect.          Assessment & Plan:   #1 chronic cough. Need to rule out ACE inhibitor associated cough. Probably has component of reflux contributing as well .   Patient has evidence for reactive airway issues on exam today with some wheezing #2 GERD  #3 hypertension  Stop benazepril and will start Losartan in it's place.  Elevate head of bed.  Cont with PPI daily. Start Qvar 80 one puff bid.

## 2010-12-05 ENCOUNTER — Other Ambulatory Visit: Payer: Self-pay | Admitting: Family Medicine

## 2010-12-05 DIAGNOSIS — N529 Male erectile dysfunction, unspecified: Secondary | ICD-10-CM

## 2010-12-26 ENCOUNTER — Ambulatory Visit: Payer: Medicare PPO | Admitting: Family Medicine

## 2010-12-27 ENCOUNTER — Ambulatory Visit (INDEPENDENT_AMBULATORY_CARE_PROVIDER_SITE_OTHER): Payer: Medicare PPO | Admitting: Family Medicine

## 2010-12-27 ENCOUNTER — Encounter: Payer: Self-pay | Admitting: Family Medicine

## 2010-12-27 ENCOUNTER — Ambulatory Visit (INDEPENDENT_AMBULATORY_CARE_PROVIDER_SITE_OTHER)
Admission: RE | Admit: 2010-12-27 | Discharge: 2010-12-27 | Disposition: A | Discharge status: Home / Self Care | Payer: Medicare PPO | Source: Ambulatory Visit | Attending: Family Medicine | Admitting: Family Medicine

## 2010-12-27 DIAGNOSIS — M25551 Pain in right hip: Secondary | ICD-10-CM

## 2010-12-27 DIAGNOSIS — M25559 Pain in unspecified hip: Secondary | ICD-10-CM

## 2010-12-27 DIAGNOSIS — R972 Elevated prostate specific antigen [PSA]: Secondary | ICD-10-CM | POA: Insufficient documentation

## 2010-12-27 DIAGNOSIS — I10 Essential (primary) hypertension: Secondary | ICD-10-CM

## 2010-12-27 DIAGNOSIS — Z87898 Personal history of other specified conditions: Secondary | ICD-10-CM

## 2010-12-27 LAB — PSA: PSA: 6.54 ng/mL — ABNORMAL HIGH (ref 0.10–4.00)

## 2010-12-27 IMAGING — CR DG HIP COMPLETE 2+V*R*
3 series · 3 of 3 positions shown · non-contrast
Comparison: None.

CLINICAL DATA: Progressive right hip pain

RIGHT HIP - COMPLETE 2+ VIEW

[view not recorded (1 of 3)]
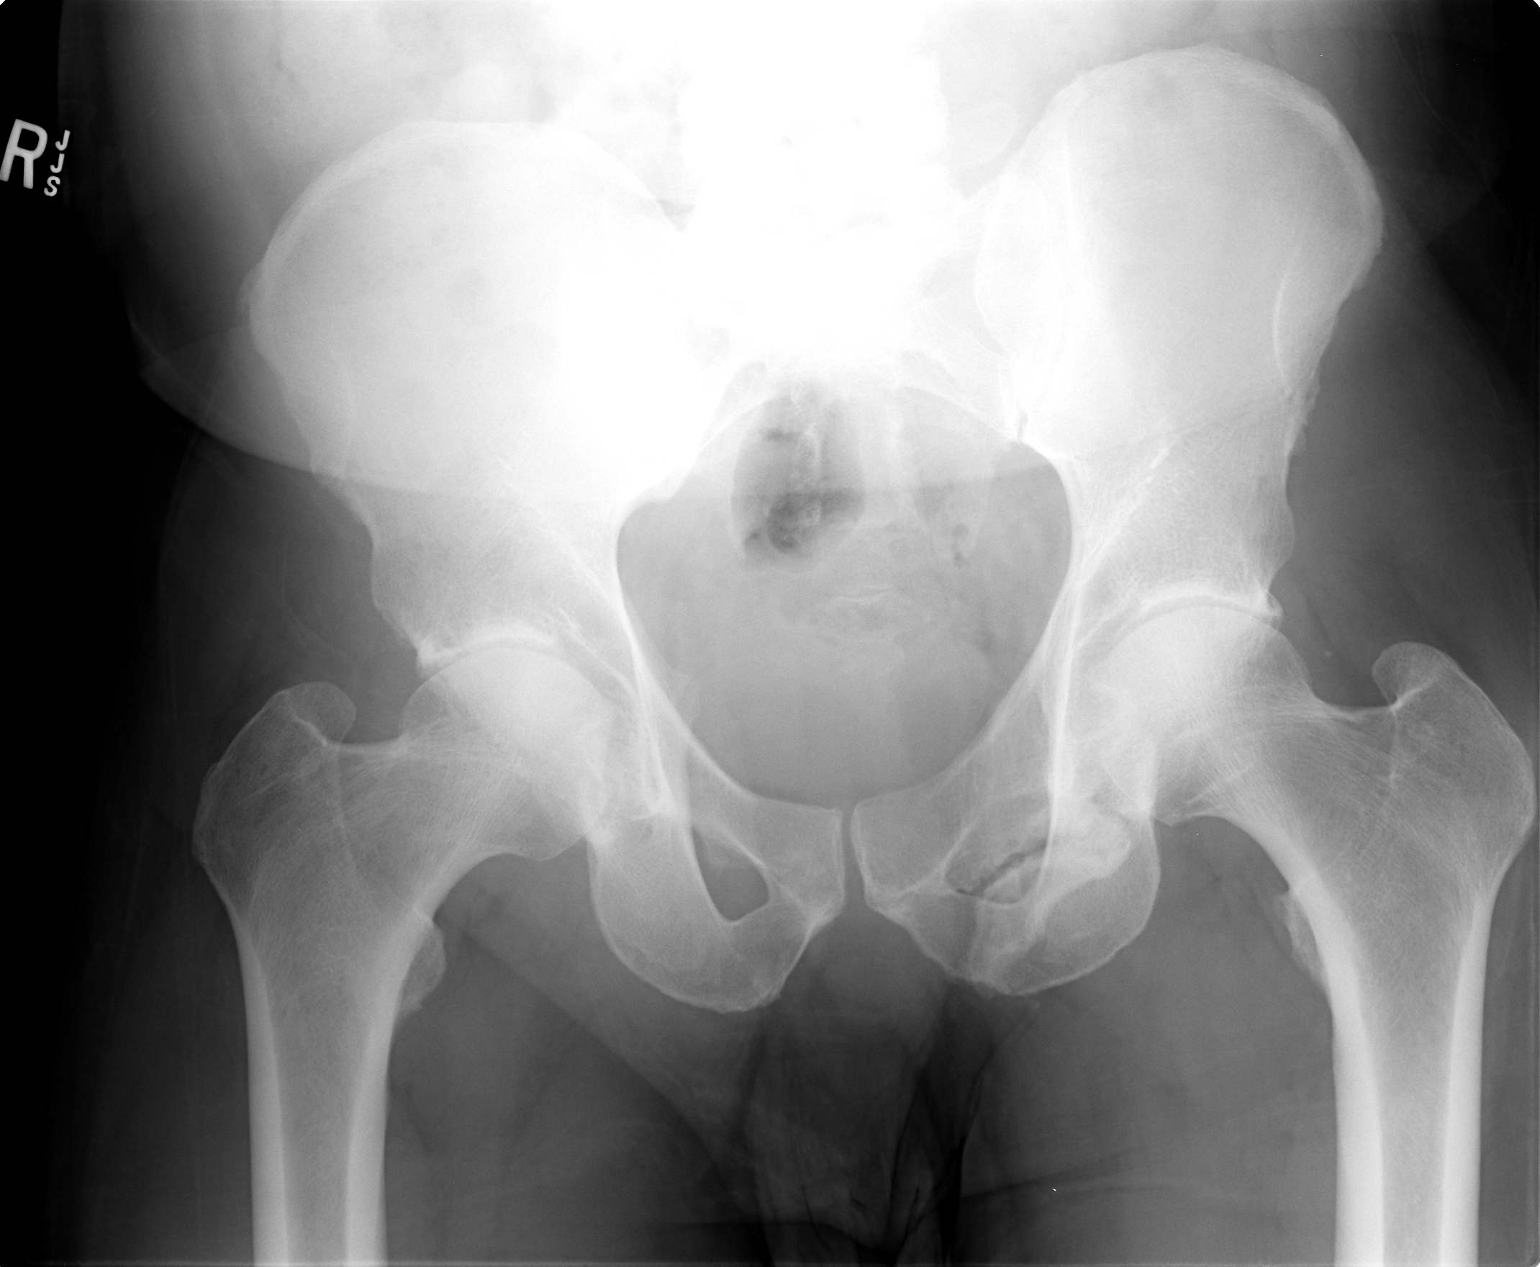

[view not recorded (2 of 3)]
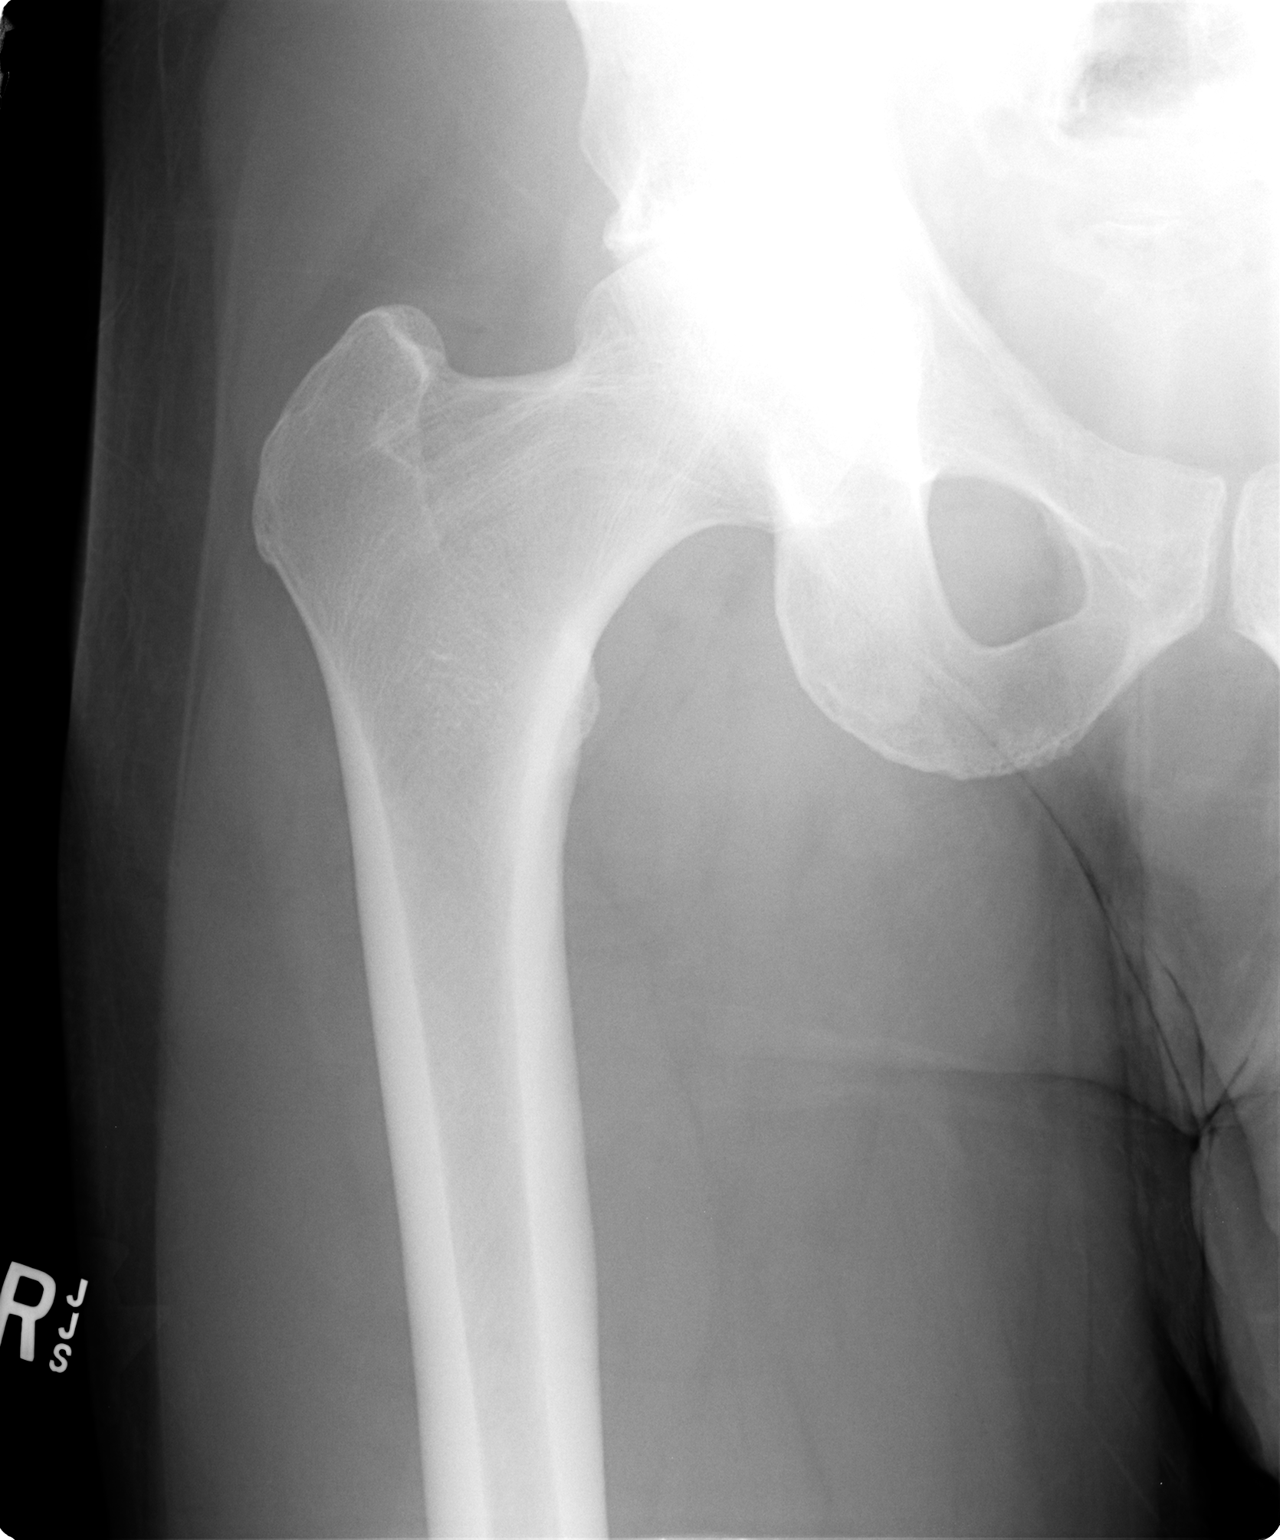

[view not recorded (3 of 3)]
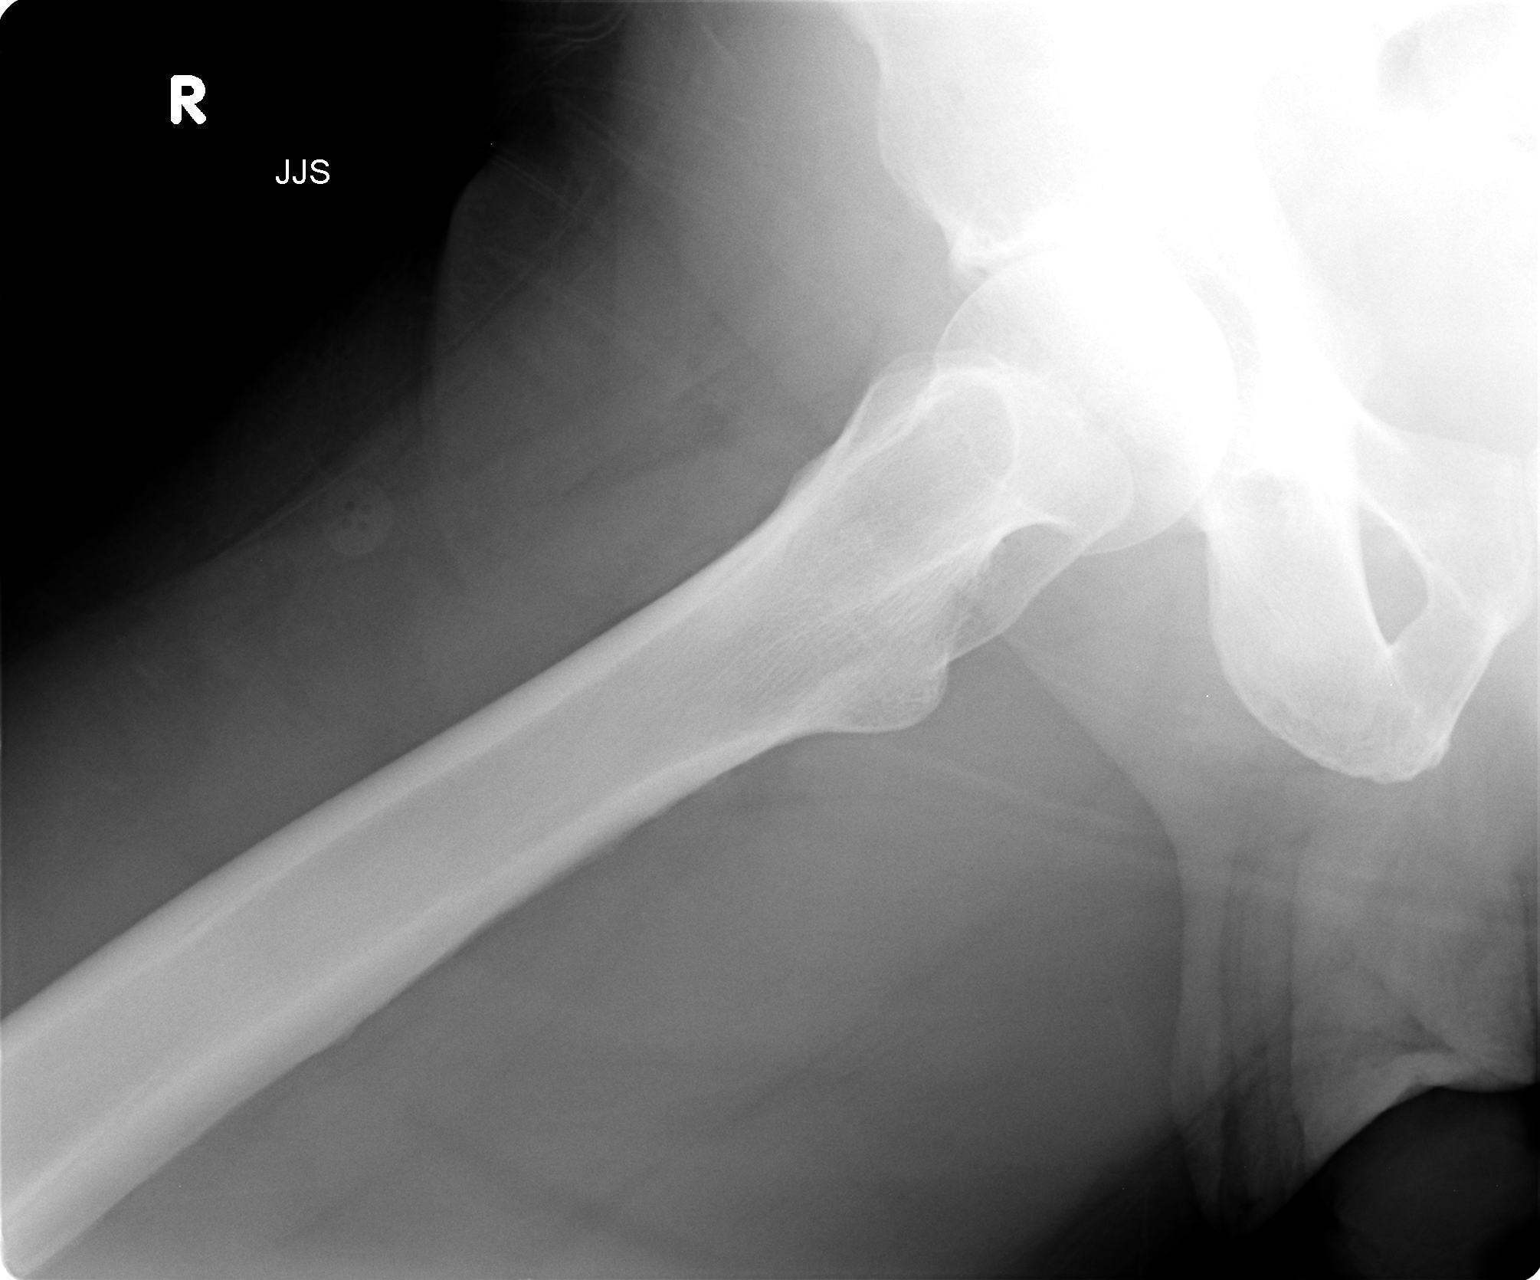

[3 of 3 positions shown; findings below may reference images not displayed]

FINDINGS: Three views of the right hip submitted.  No acute
fracture or subluxation.  No radiopaque foreign body.  Probable
chronic deformity/hyperostosis of the left pubic rami.
Degenerative changes are noted bilateral hip joints with narrowing
of superior joint space.
IMPRESSION: No acute fracture or subluxation.  Degenerative changes bilateral
hip joints.

## 2010-12-27 NOTE — Progress Notes (Signed)
Quick Note:  Pt informed and he will call on Monday with the information re urologist in [place] ______

## 2010-12-27 NOTE — Progress Notes (Signed)
  Subjective:    Patient ID: Jimmy Rice, male    DOB: 1946-04-14, 65 y.o.   MRN: [id number]  HPI  patient seen for this several following issues  History PSA. Has seen couple of urologists recently. Requests repeat PSA today. Had been taking Avodart but had possible side effects. Recent discussion of TURP and patient has decided against. Overall urine flow is fairly good at this time. He recalls most recent PSA 4.8 back in November. Previous biopsies no cancer.    Right hip pain for the past year. Progressive. Location is mostly posterior. No problem with walking. Pain worse lying on right side. Occasional stiffness. No back pain. No alleviating factors. No history of x-ray. Denies prior injury.   Hypertension. Recent addition of losartan to amlodipine. Blood pressures improved. No dizziness or orthostasis.   Review of Systems  Constitutional: Negative for fatigue.  Eyes: Negative for visual disturbance.  Respiratory: Negative for cough, chest tightness and shortness of breath.   Cardiovascular: Negative for chest pain, palpitations and leg swelling.  Genitourinary: Negative for dysuria and hematuria.  Neurological: Negative for dizziness, syncope, weakness, light-headedness and headaches.       Objective:   Physical Exam  Constitutional: He appears well-developed and well-nourished.  HENT:  Head: Normocephalic and atraumatic.  Eyes: Pupils are equal, round, and reactive to light.  Neck: Normal range of motion. Neck supple.  Cardiovascular: Normal rate, regular rhythm and normal heart sounds.   Pulmonary/Chest: Effort normal and breath sounds normal.  Musculoskeletal:        Right hip reveals fairly good range of motion. No bursa tenderness over the greater trochanteric bursa. Full range of motion knee. No lower extremity edema.  Lymphadenopathy:    He has no cervical adenopathy.  Neurological:        Full-strength lower extremities. Symmetric reflexes  Skin: No rash noted.            Assessment & Plan:   #1 hypertension improved. Continue current medications #2 history of elevated PSA and BPH. Recheck PSA #3 progressive right hip pain. Start with right hip films.

## 2010-12-27 NOTE — Progress Notes (Signed)
Quick Note:  Pt informed ______ 

## 2010-12-29 ENCOUNTER — Encounter: Payer: Self-pay | Admitting: Family Medicine

## 2010-12-31 ENCOUNTER — Telehealth: Payer: Self-pay | Admitting: *Deleted

## 2010-12-31 NOTE — Telephone Encounter (Signed)
VM from pt reporting fax for Dr Bertram Denver at [facility] is [phone].  Please fax recent PSA Labs faxed, confirmation received

## 2011-02-10 ENCOUNTER — Encounter: Payer: Self-pay | Admitting: Family Medicine

## 2011-02-10 ENCOUNTER — Ambulatory Visit (INDEPENDENT_AMBULATORY_CARE_PROVIDER_SITE_OTHER): Payer: Medicare PPO | Admitting: Family Medicine

## 2011-02-10 VITALS — BP 120/88 | HR 72 | Temp 98.1°F | Resp 12 | Ht 71.5 in | Wt 187.0 lb

## 2011-02-10 DIAGNOSIS — Z87898 Personal history of other specified conditions: Secondary | ICD-10-CM

## 2011-02-10 DIAGNOSIS — E785 Hyperlipidemia, unspecified: Secondary | ICD-10-CM

## 2011-02-10 DIAGNOSIS — I10 Essential (primary) hypertension: Secondary | ICD-10-CM

## 2011-02-10 DIAGNOSIS — E291 Testicular hypofunction: Secondary | ICD-10-CM

## 2011-02-10 DIAGNOSIS — Z0000 Encounter for general adult medical examination without abnormal findings: Secondary | ICD-10-CM

## 2011-02-10 DIAGNOSIS — Z23 Encounter for immunization: Secondary | ICD-10-CM

## 2011-02-10 LAB — BASIC METABOLIC PANEL WITH GFR
BUN: 19 mg/dL (ref 6–23)
CO2: 29 meq/L (ref 19–32)
Calcium: 9.4 mg/dL (ref 8.4–10.5)
Chloride: 103 meq/L (ref 96–112)
Creatinine, Ser: 1.5 mg/dL (ref 0.4–1.5)
GFR: 61.29 mL/min
Glucose, Bld: 92 mg/dL (ref 70–99)
Potassium: 4.3 meq/L (ref 3.5–5.1)
Sodium: 140 meq/L (ref 135–145)

## 2011-02-10 LAB — LIPID PANEL
Cholesterol: 206 mg/dL — ABNORMAL HIGH (ref 0–200)
HDL: 50.9 mg/dL (ref >39.00)
Triglycerides: 66 mg/dL (ref 0.0–149.0)
VLDL: 13.2 mg/dL (ref 0.0–40.0)

## 2011-02-10 MED ORDER — PNEUMOCOCCAL VAC POLYVALENT 25 MCG/0.5ML IJ INJ: 0.5000 mL | INJECTION | Freq: Once | INTRAMUSCULAR | Status: DC

## 2011-02-10 NOTE — Progress Notes (Signed)
Subjective:    Patient ID: Jimmy Rice, male    DOB: Sep 14, 1946, 65 y.o.   MRN: [id number]  HPI Patient here for Medicare wellness exam and follow up medical problems.  1.  Risk factors based on Past Medical , Social, and Family history  patient has history of hypogonadism, hypertension, asthma, BPH, degenerative arthritis and recent workup for elevated PSA. Recent prostate biopsy per her urologist in [place]. He has followup in 6 months. Patient is nonsmoker. Family history reviewed and as recorded elsewhere 2.  Limitations in physical activities no limitation of activities. He remains quite active physically and excise this fairly regularly 3.  Depression/mood no anxiety or depression issues 4.  Hearing no major hearing deficits 5.  ADLs fully independent in all ADLs 6.  Cognitive function (orientation to time and place, language, writing, speech,memory) no short or long-term memory issues. Judgment intact. No language deficits. 7.  Home Safety  no safety issues identified. 8.  Height, weight, and visual acuity. Recent eye exam and question of early glaucoma per patient. Height and weight stable 9.  Counseling  Discussed diet and exercise. 10. Recommendation of preventive services.  Needs pneumovax and will check on insurance coverage for shingles vaccine. 11. Labs based on risk factors lipid panel and basic metabolic panel 12. Care Plan Exercise regularly.  Continue with yearly flu vaccine.  Hypertension treated with amlodipine and Losartan.  BP has been well controlled with no side effects.  No dizziness.  No chest pain. Hx hypogonadism and taken off testosterone with recent elevated PSA.  Hx of BPH and urology placed him on finasteride.  He has hx of asthma and still has occasional intermittent cough.   Past Medical History  Diagnosis Date  . HYPOGONADISM 01/18/2009  . HYPERTENSION 01/18/2009  . GERD 01/18/2009  . BENIGN PROSTATIC HYPERTROPHY, HX OF 04/04/2009   No past  surgical history on file.  reports that he has never smoked. He does not have any smokeless tobacco history on file. His alcohol and drug histories not on file. family history is not on file. No Known Allergies     Review of Systems  Constitutional: Negative for fever, chills, activity change, appetite change and fatigue.  HENT: Negative for ear pain, congestion and trouble swallowing.   Eyes: Negative for pain and visual disturbance.  Respiratory: Negative for cough, shortness of breath and wheezing.   Cardiovascular: Negative for chest pain and palpitations.  Gastrointestinal: Negative for nausea, vomiting, abdominal pain, diarrhea, constipation, blood in stool, abdominal distention and rectal pain.  Genitourinary: Negative for dysuria, hematuria and testicular pain.  Musculoskeletal: Negative for joint swelling and arthralgias.  Skin: Negative for rash.  Neurological: Negative for dizziness, syncope and headaches.  Hematological: Negative for adenopathy.  Psychiatric/Behavioral: Negative for confusion and dysphoric mood.       Objective:   Physical Exam  Constitutional: He is oriented to person, place, and time. He appears well-developed and well-nourished. No distress.  HENT:  Head: Normocephalic and atraumatic.  Right Ear: External ear normal.  Left Ear: External ear normal.  Mouth/Throat: Oropharynx is clear and moist.  Eyes: Conjunctivae and EOM are normal. Pupils are equal, round, and reactive to light.  Neck: Normal range of motion. Neck supple. No thyromegaly present.  Cardiovascular: Normal rate, regular rhythm and normal heart sounds.   No murmur heard. Pulmonary/Chest: No respiratory distress. He has no wheezes. He has no rales.  Abdominal: Soft. Bowel sounds are normal. He exhibits no distension and no mass. There is  no tenderness. There is no rebound and no guarding.  Musculoskeletal: He exhibits no edema.  Lymphadenopathy:    He has no cervical adenopathy.    Neurological: He is alert and oriented to person, place, and time. He displays normal reflexes. No cranial nerve deficit.  Skin: No rash noted.  Psychiatric: He has a normal mood and affect.          Assessment & Plan:  Medicare wellness exam. Labs with basic metabolic panel and lipid panel. Pneumovax will be given. Check on coverage for shingles vaccine. Colonoscopy 6 years ago. Tetanus up-to-date. Hypertension.  Stable.  Continue current meds. Hypogonadism.  Needs to be off testosterone with ? Of early prostate cancer. BPH.  Symptoms stable. Asthma.  PVX given and needs to continue with yearly flu vaccine.

## 2011-02-10 NOTE — Patient Instructions (Signed)
Continue regular exercise. Continue yearly flu vaccine Continued followup with urologist as scheduled. You'll need a tetanus booster in 5 years

## 2011-02-12 NOTE — Progress Notes (Signed)
Quick Note:  Pt informed ______ 

## 2011-02-18 ENCOUNTER — Ambulatory Visit (INDEPENDENT_AMBULATORY_CARE_PROVIDER_SITE_OTHER): Payer: Medicare PPO | Admitting: Family Medicine

## 2011-02-19 ENCOUNTER — Encounter: Payer: Self-pay | Admitting: Family Medicine

## 2011-02-19 ENCOUNTER — Ambulatory Visit (INDEPENDENT_AMBULATORY_CARE_PROVIDER_SITE_OTHER): Payer: Medicare PPO | Admitting: Family Medicine

## 2011-02-19 VITALS — BP 120/70 | Temp 98.2°F | Wt 190.0 lb

## 2011-02-19 DIAGNOSIS — R0989 Other specified symptoms and signs involving the circulatory and respiratory systems: Secondary | ICD-10-CM

## 2011-02-19 DIAGNOSIS — R0600 Dyspnea, unspecified: Secondary | ICD-10-CM

## 2011-02-19 DIAGNOSIS — R0609 Other forms of dyspnea: Secondary | ICD-10-CM

## 2011-02-19 DIAGNOSIS — J45909 Unspecified asthma, uncomplicated: Secondary | ICD-10-CM

## 2011-02-19 DIAGNOSIS — R059 Cough, unspecified: Secondary | ICD-10-CM

## 2011-02-19 DIAGNOSIS — R05 Cough: Secondary | ICD-10-CM

## 2011-02-19 NOTE — Patient Instructions (Signed)
Qvar one puff twice daily.  Rinse mouth after use. Nasonex 2 sprays per nostril daily. Try over the counter antihistamine such as Zyrtec or Allegra.

## 2011-02-19 NOTE — Progress Notes (Signed)
  Subjective:    Patient ID: Jimmy Rice, male    DOB: 08-17-1946, 65 y.o.   MRN: [id number]  HPI 65 year old nonsmoker with reported history of asthma who presents with 3 to four-day history of some increased shortness of breath mostly at night. No chest pain. No exertional symptoms. Increased associated cough and some postnasal drainage. No GERD symptoms. Takes Nexium regularly. Has used albuterol in the past for short-term relief. Increase sputum production but mostly clear. Denies fever or chills. For the most part though cough is dry. No history of smoking.  He is not aware of any clear allergic precipitants. Symptoms do seem worse when trying to sleep. He tried elevation of head of bed without improvement.  CXR January no acute findings.   Review of Systems  Constitutional: Negative for fever, chills, appetite change, fatigue and unexpected weight change.  HENT: Positive for postnasal drip and sinus pressure. Negative for sore throat.   Respiratory: Positive for cough and shortness of breath. Negative for apnea, choking and wheezing.   Cardiovascular: Negative for chest pain, palpitations and leg swelling.  Gastrointestinal: Negative for abdominal pain.  Skin: Negative for rash.  Neurological: Negative for syncope and headaches.  Hematological: Negative for adenopathy. Does not bruise/bleed easily.       Objective:   Physical Exam  Constitutional: He is oriented to person, place, and time. He appears well-developed and well-nourished.  HENT:  Right Ear: External ear normal.  Left Ear: External ear normal.  Mouth/Throat: Oropharynx is clear and moist.  Neck: Neck supple. No JVD present.  Cardiovascular: Normal rate, regular rhythm and normal heart sounds.   Pulmonary/Chest: Effort normal and breath sounds normal. No stridor. He has no rales.       Somewhat diminished breath sounds in both bases. No rales. A few faint wheezes. No retractions.  Musculoskeletal: He exhibits no  edema.  Lymphadenopathy:    He has no cervical adenopathy.  Neurological: He is alert and oriented to person, place, and time. No cranial nerve deficit.  Skin: No rash noted.  Psychiatric: He has a normal mood and affect.          Assessment & Plan:  Dyspnea. Suspect related to reactive airway disease. Spirometry machine down and unable use. Start Qvar 80 1 puff twice daily and Nasonex 2 sprays per nostril once daily. Try over-the-counter antihistamine such as Zyrtec or Allegra and reassess 2 weeks.

## 2011-02-21 NOTE — Discharge Summary (Signed)
NAMEELGIN, Jimmy Rice              ACCOUNT NO.:  [id number]   MEDICAL RECORD NO.:  [id number]          PATIENT TYPE:  INP   LOCATION:  [id number]                         FACILITY:  [facility]   PHYSICIAN:  Vladimir Faster, MD        DATE OF BIRTH:  03/19/1946   DATE OF ADMISSION:  10/23/2006  DATE OF DISCHARGE:  10/25/2006                               DISCHARGE SUMMARY   PRIMARY CARE PHYSICIAN:  Dr. Elease Hashimoto.   DISCHARGE DIAGNOSES:  1. Non-cardiac chest pain, which is completely resolved.  2. Hypertension.  3. Benign prostatic hypertrophy.   DISCHARGE MEDICATIONS:  Enalapril 20 mg once daily.   HISTORY OF PRESENT ILLNESS:  Jimmy Rice is a 65 year old gentleman who  has a history of hypertension who presented to the ER with complaints of  chest pain which started at 6 p.m. on the evening of admission.  It had  lasted all night and his discomfort started in his right arm and then  traveled up his right arm to his chest and settled in the mid chest  area.  It was dull, aching, and intermittent.  He was admitted for  ruling out an acute coronary syndrome.   PROBLEM LIST:  1. Atypical chest pain.  This chest pain was fairly atypical with      starting from the right shoulder.  It was a low dull aching pain,      which was there for a whole night.  We admitted him and did sets of      cardiac markers, which were all within normal limits and we did      EKGs on the day of discharge and at the time of admission, which      did not show any ST- or T-wave changes.  His pain has completely      resolved since admission and I believe it is probably a      musculoskeletal pain.  I have asked him to take over-the-counter      Tylenol if needed for the pain.  2. Hypertension.  He was put on Enalapril to take at home and his      blood pressure was well controlled while in the hospital.   DISPOSITION:  He will now be discharged home in a stable condition.  We  have ruled out an acute coronary syndrome in  this hospital stay.   FOLLOW UP:  He is asked to follow up with his primary care doctor, Dr.  Elease Hashimoto, in about 3-4 weeks.      Vladimir Faster, MD  Electronically Signed     PKN/MEDQ  D:  10/25/2006  T:  10/25/2006  Job:  [id number]

## 2011-02-21 NOTE — H&P (Signed)
NAMEDEVREN, RYTHER              ACCOUNT NO.:  [id number]   MEDICAL RECORD NO.:  [id number]          PATIENT TYPE:  INP   LOCATION:  [id number]                         FACILITY:  [facility]   PHYSICIAN:  Jana Hakim, M.D. DATE OF BIRTH:  [birth year]   DATE OF ADMISSION:  10/23/2006  DATE OF DISCHARGE:                              HISTORY & PHYSICAL   PRIMARY CARE PHYSICIAN:  Dr. Elease Hashimoto, [facility].   CHIEF COMPLAINT:  Chest pain.   HISTORY OF PRESENT ILLNESS:  This is a 65 year old male with a history  of hypertension who presented to the emergency department secondary to  chest pain which started at approximately 6 p.m. the evening of  admission.  He reports that the pain began as discomfort in his right  arm and this traveled up the right arm into the chest and settled in the  mid chest area.  He reports that the pain was a dull pain and had an  intensity of 6/10 and was intermittent.  He reports having associated  symptoms of nausea, dizziness, however had no vomiting, shortness of  breath or diaphoresis.  He denies having any previous similar episodes.  He denies having any fevers, chills, cough or syncope.   PAST MEDICAL HISTORY:  Hypertension and benign prostatic hypertrophy.   MEDICATIONS:  Enalapril 20 mg one p.o. daily.   PAST SURGICAL HISTORY:  Right inguinal hernia repair in 1997.   ALLERGIES:  No known drug allergies.   SOCIAL HISTORY:  Nonsmoker.  Alcohol use is rare.   FAMILY HISTORY:  Positive for hypertension in his father.  Positive for  cancer in his mother, his maternal aunt and his sister with breast  cancer.  No history of coronary artery disease or diabetes in his family  that he knows of.   REVIEW OF SYSTEMS:  Pertinents are mentioned above.   PHYSICAL EXAMINATION FINDINGS:  GENERAL:  Pleasant 65 year old well-  nourished, well-developed male in no acute distress currently.  VITAL SIGNS:  Temperature 98.0, blood pressure 123/63, heart  rate 71,  respirations 18, O2 saturations 97-100%.  HEENT:  Normocephalic, atraumatic.  Pupils equally round and reactive to  light.  His eyes have the mild appearance of exophthalmos.  Funduscopic  examination benign.  Oropharynx is clear.  NECK:  Supple, full range of motion.  No thyromegaly, adenopathy or  jugular venous distension.  CARDIOVASCULAR:  Regular rate and rhythm.  No murmurs, gallops or rubs.  LUNGS:  Clear to auscultation bilaterally.  ABDOMEN:  Positive bowel sounds, soft, nontender, nondistended.  EXTREMITIES:  Without edema.  NEUROLOGIC:  Alert and oriented x3.  Nonfocal.  RECTAL:  Deferred.  GENITOURINARY:  Deferred.   LABORATORY STUDIES:  White blood cell count of 6.4, hemoglobin 14.7,  hematocrit 45.1, platelets 178, neutrophils 54%, lymphocytes 33%.  Sodium 139, potassium 3.7, chloride 102, CO2 29, BUN 12, creatinine 1.3  and glucose 147.  Cardiac enzymes:  Myoglobin 109, CK-MB 2.5 and  troponin less than 0.05.  chest x-ray reveals no acute disease process.  EKG reveals normal sinus rhythm with nonspecific ST-segment changes.   ASSESSMENT:  This is  a 65 year old male with complaints of chest pain  being admitted with chest pain to rule out an acute myocardial  infarction.  He is actually being admitted for a 24-hour observation  while cardiac enzymes are being performed q.8h. x3.  He has been placed  on nitrates, nasal cannula oxygen, aspirin therapy, and will continue on  his ACE inhibitor therapy which he receives for his blood pressure.  The  further therapies will ensue pending results of his cardiac enzymes.  The patient will be placed on Lovenox for DVT prophylaxis, and GI  prophylaxis has also been ordered.      Jana Hakim, M.D.  Electronically Signed     HJ/MEDQ  D:  10/24/2006  T:  10/24/2006  Job:  [id number]   cc:   Carolann Littler, M.D.

## 2011-03-05 ENCOUNTER — Ambulatory Visit: Payer: Medicare PPO | Admitting: Family Medicine

## 2011-03-10 ENCOUNTER — Ambulatory Visit (INDEPENDENT_AMBULATORY_CARE_PROVIDER_SITE_OTHER): Payer: Medicare PPO | Admitting: Family Medicine

## 2011-03-10 ENCOUNTER — Encounter: Payer: Self-pay | Admitting: Family Medicine

## 2011-03-10 VITALS — BP 140/90 | Temp 98.4°F | Wt 192.0 lb

## 2011-03-10 DIAGNOSIS — J45909 Unspecified asthma, uncomplicated: Secondary | ICD-10-CM

## 2011-03-10 MED ORDER — BECLOMETHASONE DIPROPIONATE 80 MCG/ACT IN AERS: 1.0000 | INHALATION_SPRAY | Freq: Two times a day (BID) | RESPIRATORY_TRACT | Status: DC

## 2011-03-10 NOTE — Progress Notes (Signed)
  Subjective:    Patient ID: Jimmy Rice, male    DOB: [birth year], 65 y.o.   MRN: [id number]  HPI Patient seen for followup regarding dyspnea and chronic cough. Refer to prior note. Suspected asthma but could not confirm as spirometry machine was down. We started Qvar 80 mg twice daily though he's been using more frequently. He has seen improvement in symptoms. Less dyspnea. Still has occasional cough. Has just recently tried resume exercise and still has some decreased capacity for exercise. No chest pain. No thrush. Overall symptomatically improved   Review of Systems  Constitutional: Negative for fever and chills.  HENT: Positive for congestion and postnasal drip. Negative for sore throat.   Respiratory: Positive for cough. Negative for shortness of breath and wheezing.   Cardiovascular: Negative for chest pain, palpitations and leg swelling.       Objective:   Physical Exam  Constitutional: He appears well-developed and well-nourished.  HENT:  Head: Normocephalic and atraumatic.  Right Ear: External ear normal.  Left Ear: External ear normal.  Mouth/Throat: Oropharynx is clear and moist. No oropharyngeal exudate.  Neck: Neck supple. No thyromegaly present.  Cardiovascular: Normal rate, regular rhythm and normal heart sounds.   Pulmonary/Chest: Effort normal and breath sounds normal. No respiratory distress. He has no wheezes. He has no rales.  Musculoskeletal: He exhibits no edema.  Lymphadenopathy:    He has no cervical adenopathy.          Assessment & Plan:  Patient has history of asthma. Symptomatically improved with steroid inhaler. Spirometry obtained today which shows relatively normal pattern. FEV1 85% predicted-suspect improved compared with what pre-steroid inhaler values would show.

## 2011-03-12 ENCOUNTER — Other Ambulatory Visit: Payer: Self-pay | Admitting: *Deleted

## 2011-03-12 MED ORDER — MOMETASONE FUROATE 50 MCG/ACT NA SUSP: 2.0000 | Freq: Every day | NASAL | Status: DC

## 2011-03-12 NOTE — Telephone Encounter (Signed)
VM from pt requesting nasal spray refill.  I do not see on on his med list, samples perhaps? Yes, samples of Nasonex were given OV 5/7. Rx sent

## 2011-04-21 ENCOUNTER — Encounter: Payer: Self-pay | Admitting: Family Medicine

## 2011-04-21 ENCOUNTER — Ambulatory Visit (INDEPENDENT_AMBULATORY_CARE_PROVIDER_SITE_OTHER): Payer: Medicare PPO | Admitting: Family Medicine

## 2011-04-21 VITALS — BP 130/84 | Temp 98.7°F | Wt 197.0 lb

## 2011-04-21 DIAGNOSIS — I10 Essential (primary) hypertension: Secondary | ICD-10-CM

## 2011-04-21 DIAGNOSIS — Z113 Encounter for screening for infections with a predominantly sexual mode of transmission: Secondary | ICD-10-CM

## 2011-04-21 LAB — HIV ANTIBODY (ROUTINE TESTING W REFLEX): HIV: NONREACTIVE

## 2011-04-21 NOTE — Progress Notes (Signed)
  Subjective:    Patient ID: Jimmy Rice, male    DOB: 1946-09-22, 65 y.o.   MRN: [id number]  HPI Concern for elevated blood pressure. Has history of hypertension treated with amlodipine and losartan. Was at another office yesterday with a blood pressure 140/90. No headaches or dizziness. Compliant with therapy. No orthostasis. Exercises regularly.  Concern for HIV testing. No history of STD. Multiple contacts previously. Requesting HIV screen. No appetite or weight changes. No rashes.   Review of Systems  Constitutional: Negative for fever, chills, appetite change and unexpected weight change.  Respiratory: Negative for cough and shortness of breath.   Cardiovascular: Negative for chest pain, palpitations and leg swelling.  Skin: Negative for rash.       Objective:   Physical Exam  Constitutional: He appears well-developed and well-nourished. No distress.  Neck: Neck supple. No thyromegaly present.  Cardiovascular: Normal rate, regular rhythm and normal heart sounds.   Pulmonary/Chest: Effort normal and breath sounds normal. No respiratory distress. He has no wheezes. He has no rales.  Lymphadenopathy:    He has no cervical adenopathy.          Assessment & Plan:  #1 hypertension stable and at goal by reading today. Continue to monitor with no changes made #2 STD. screening. HIV screen.  Discussed other possible screening such as RPR and at this point he is not interested

## 2011-04-22 NOTE — Progress Notes (Signed)
Quick Note:  Pt informed ______ 

## 2011-07-23 ENCOUNTER — Other Ambulatory Visit: Payer: Self-pay | Admitting: Family Medicine

## 2011-09-25 ENCOUNTER — Other Ambulatory Visit: Payer: Self-pay | Admitting: Family Medicine

## 2011-12-11 ENCOUNTER — Telehealth: Payer: Self-pay | Admitting: Family Medicine

## 2011-12-11 DIAGNOSIS — E291 Testicular hypofunction: Secondary | ICD-10-CM

## 2011-12-11 MED ORDER — SILDENAFIL CITRATE 100 MG PO TABS: 100.0000 mg | ORAL_TABLET | ORAL | Status: DC | PRN

## 2011-12-11 NOTE — Telephone Encounter (Signed)
Pt called refill of VIAGRA 100 MG tablet to [facility] at [address].

## 2011-12-15 ENCOUNTER — Other Ambulatory Visit: Payer: Self-pay | Admitting: Family Medicine

## 2011-12-16 ENCOUNTER — Other Ambulatory Visit: Payer: Self-pay | Admitting: Family Medicine

## 2012-03-18 ENCOUNTER — Other Ambulatory Visit: Payer: Self-pay | Admitting: Family Medicine

## 2012-04-14 ENCOUNTER — Other Ambulatory Visit: Payer: Self-pay | Admitting: Family Medicine

## 2012-04-14 ENCOUNTER — Other Ambulatory Visit: Payer: Self-pay | Admitting: Internal Medicine

## 2012-05-05 ENCOUNTER — Other Ambulatory Visit (INDEPENDENT_AMBULATORY_CARE_PROVIDER_SITE_OTHER): Payer: Medicare PPO

## 2012-05-05 DIAGNOSIS — I10 Essential (primary) hypertension: Secondary | ICD-10-CM

## 2012-05-05 DIAGNOSIS — Z0000 Encounter for general adult medical examination without abnormal findings: Secondary | ICD-10-CM

## 2012-05-05 DIAGNOSIS — Z125 Encounter for screening for malignant neoplasm of prostate: Secondary | ICD-10-CM

## 2012-05-05 LAB — CBC WITH DIFFERENTIAL/PLATELET
Basophils Relative: 0.9 % (ref 0.0–3.0)
Eosinophils Relative: 10.1 % — ABNORMAL HIGH (ref 0.0–5.0)
MCV: 91.4 fl (ref 78.0–100.0)
Monocytes Absolute: 0.3 10*3/uL (ref 0.1–1.0)
Neutrophils Relative %: 46.4 % (ref 43.0–77.0)
RBC: 4.51 Mil/uL (ref 4.22–5.81)
WBC: 3.6 10*3/uL — ABNORMAL LOW (ref 4.5–10.5)

## 2012-05-05 LAB — BASIC METABOLIC PANEL
Chloride: 102 mEq/L (ref 96–112)
Creatinine, Ser: 1.5 mg/dL (ref 0.4–1.5)
Potassium: 3.3 mEq/L — ABNORMAL LOW (ref 3.5–5.1)
Sodium: 138 mEq/L (ref 135–145)

## 2012-05-05 LAB — HEPATIC FUNCTION PANEL
ALT: 20 U/L (ref 0–53)
Alkaline Phosphatase: 59 U/L (ref 39–117)
Bilirubin, Direct: 0.1 mg/dL (ref 0.0–0.3)
Total Protein: 7.4 g/dL (ref 6.0–8.3)

## 2012-05-05 LAB — LIPID PANEL
LDL Cholesterol: 113 mg/dL — ABNORMAL HIGH (ref 0–99)
Total CHOL/HDL Ratio: 4
VLDL: 29 mg/dL (ref 0.0–40.0)

## 2012-05-05 LAB — TSH: TSH: 1.97 u[IU]/mL (ref 0.35–5.50)

## 2012-05-05 LAB — TESTOSTERONE: Testosterone: 317.6 ng/dL — ABNORMAL LOW (ref 350.00–890.00)

## 2012-05-12 ENCOUNTER — Ambulatory Visit (INDEPENDENT_AMBULATORY_CARE_PROVIDER_SITE_OTHER): Payer: Medicare PPO | Admitting: Family Medicine

## 2012-05-12 ENCOUNTER — Encounter: Payer: Self-pay | Admitting: Family Medicine

## 2012-05-12 VITALS — BP 150/90 | HR 80 | Temp 98.3°F | Resp 12 | Ht 71.5 in | Wt 200.0 lb

## 2012-05-12 DIAGNOSIS — Z0000 Encounter for general adult medical examination without abnormal findings: Secondary | ICD-10-CM

## 2012-05-12 DIAGNOSIS — R7309 Other abnormal glucose: Secondary | ICD-10-CM

## 2012-05-12 DIAGNOSIS — I10 Essential (primary) hypertension: Secondary | ICD-10-CM

## 2012-05-12 DIAGNOSIS — R7303 Prediabetes: Secondary | ICD-10-CM

## 2012-05-12 DIAGNOSIS — R635 Abnormal weight gain: Secondary | ICD-10-CM

## 2012-05-12 DIAGNOSIS — R972 Elevated prostate specific antigen [PSA]: Secondary | ICD-10-CM

## 2012-05-12 DIAGNOSIS — E876 Hypokalemia: Secondary | ICD-10-CM

## 2012-05-12 DIAGNOSIS — Z2911 Encounter for prophylactic immunotherapy for respiratory syncytial virus (RSV): Secondary | ICD-10-CM

## 2012-05-12 MED ORDER — AMLODIPINE BESYLATE 10 MG PO TABS: 10.0000 mg | ORAL_TABLET | Freq: Every day | ORAL | Status: DC

## 2012-05-12 NOTE — Progress Notes (Signed)
Subjective:    Patient ID: Jimmy Rice, male    DOB: 1946/06/08, 66 y.o.   MRN: [id number]  HPI  Patient is here for Medicare wellness exam and medical followup. Has chronic problems reviewed as indicated below. History of hypertension, BPH, elevated PSA with negative prostate biopsies for cancer, GERD, and low testosterone. Followed by urologist. Lucile Shutters taking Flomax and previously took finasteride but has not see improvement. They're considering possible TURP procedure. He is no longer taking testosterone replacement.  Major complaint is increasing fatigue. He stopped exercising. Denies depression. Has had some weight gain since stopping exercise. No chest pain. Not monitoring blood pressures regularly. Takes amlodipine 5 mg daily losartan 50 mg daily for blood pressure. GERD well-controlled with Nexium 20 mg daily. Patient had prior Pneumovax and tetanus up-to-date. No history of shingles vaccine. Last colonoscopy he estimates about 15 years ago. He received yearly flu vaccine.  Past Medical History  Diagnosis Date  . HYPOGONADISM 01/18/2009  . HYPERTENSION 01/18/2009  . GERD 01/18/2009  . BENIGN PROSTATIC HYPERTROPHY, HX OF 04/04/2009   No past surgical history on file.  reports that he has never smoked. He does not have any smokeless tobacco history on file. His alcohol and drug histories not on file. family history is not on file. No Known Allergies  1.  Risk factors based on Past Medical , Social, and Family history as above. 2.  Limitations in physical activities low risk for fall. Minimal exercise currently 3.  Depression/mood denies depression or other mood disorder 4.  Hearing fully intact 5.  ADLs independent in all 6.  Cognitive function (orientation to time and place, language, writing, speech,memory) no memory deficits. Language and judgment intact 7.  Home Safety no issues identified 8.  Height, weight, and visual acuity. Gradual weight gain during past year. Vision stable.  Height unchanged 9.  Counseling discussed diet and need to lose weight. Potassium rich diet with minimal hypokalemia 10. Recommendation of preventive services. Consider shingles vaccine-will check cost. Continue yearly flu vaccine. Schedule repeat colonoscopy 11. Labs based on risk factors labs reviewed with patient 12. Care Plan as above.     Review of Systems  Constitutional: Positive for fatigue. Negative for fever, activity change and appetite change.  HENT: Negative for ear pain, congestion, trouble swallowing and voice change.   Eyes: Negative for pain and visual disturbance.  Respiratory: Negative for cough, shortness of breath and wheezing.   Cardiovascular: Negative for chest pain and palpitations.  Gastrointestinal: Negative for nausea, vomiting, abdominal pain, diarrhea, constipation, blood in stool, abdominal distention and rectal pain.  Genitourinary: Negative for hematuria and testicular pain.  Musculoskeletal: Negative for joint swelling and arthralgias.  Skin: Negative for rash.  Neurological: Negative for dizziness, syncope and headaches.  Hematological: Negative for adenopathy.  Psychiatric/Behavioral: Negative for confusion and dysphoric mood.       Objective:   Physical Exam  Constitutional: He is oriented to person, place, and time. He appears well-developed and well-nourished. No distress.  HENT:  Head: Normocephalic and atraumatic.  Right Ear: External ear normal.  Left Ear: External ear normal.  Mouth/Throat: Oropharynx is clear and moist.  Eyes: Conjunctivae and EOM are normal. Pupils are equal, round, and reactive to light.  Neck: Normal range of motion. Neck supple. No thyromegaly present.  Cardiovascular: Normal rate, regular rhythm and normal heart sounds.   No murmur heard. Pulmonary/Chest: No respiratory distress. He has no wheezes. He has no rales.  Abdominal: Soft. Bowel sounds are normal. He exhibits  no distension and no mass. There is no  tenderness. There is no rebound and no guarding.  Genitourinary:       Deferred as recent per urology.  Musculoskeletal: He exhibits no edema.  Lymphadenopathy:    He has no cervical adenopathy.  Neurological: He is alert and oriented to person, place, and time. He displays normal reflexes. No cranial nerve deficit.  Skin: No rash noted.  Psychiatric: He has a normal mood and affect.          Assessment & Plan:  #1 health maintenance. Set up screening colonoscopy. Reminder for yearly flu vaccine. Pneumovax up-to-date. Tetanus up-to-date. Check on coverage for shingles vaccine #2 mild hypokalemia. Surprising, as not on diuretic. Potassium rich diet and recheck in one month #3 hypertension poorly controlled titrate amlodipine 10 mg daily. Avoid diuretic with low potassium  #4 recent weight gain. Increase exercise and work on weight loss  #5 prediabetes - recent lab with glucose 121. Reassess at followup

## 2012-05-12 NOTE — Patient Instructions (Signed)
Continue with yearly flu vaccine Establish more consistent exercise.  Foods Rich in Potassium Food / Potassium (mg)  Apricots, dried,  cup / 378 mg   Apricots, raw, 1 cup halves / 401 mg   Avocado,  / 487 mg   Banana, 1 large / 487 mg   Beef, lean, round, 3 oz / 202 mg   Cantaloupe, 1 cup cubes / 427 mg   Dates, medjool, 5 whole / 835 mg   Ham, cured, 3 oz / 212 mg   Lentils, dried,  cup / 458 mg   Lima beans, frozen,  cup / 258 mg   Orange, 1 large / 333 mg   Orange juice, 1 cup / 443 mg   Peaches, dried,  cup / 398 mg   Peas, split, cooked,  cup / 355 mg   Potato, boiled, 1 medium / 515 mg   Prunes, dried, uncooked,  cup / 318 mg   Raisins,  cup / 309 mg   Salmon, pink, raw, 3 oz / 275 mg   Sardines, canned , 3 oz / 338 mg   Tomato, raw, 1 medium / 292 mg   Tomato juice, 6 oz / 417 mg   [place], 3 oz / 349 mg  Document Released: 09/22/2005 Document Revised: 06/04/2011 Document Reviewed: 02/05/2009 [facility] Patient Information 2012 [place], [place].

## 2012-05-14 ENCOUNTER — Encounter: Payer: Self-pay | Admitting: Internal Medicine

## 2012-05-31 ENCOUNTER — Telehealth: Payer: Self-pay | Admitting: Family Medicine

## 2012-05-31 ENCOUNTER — Encounter: Payer: Self-pay | Admitting: Family Medicine

## 2012-05-31 ENCOUNTER — Ambulatory Visit (INDEPENDENT_AMBULATORY_CARE_PROVIDER_SITE_OTHER): Payer: Medicare PPO | Admitting: Family Medicine

## 2012-05-31 ENCOUNTER — Other Ambulatory Visit: Payer: Self-pay | Admitting: Family Medicine

## 2012-05-31 VITALS — BP 140/78 | Temp 98.2°F | Wt 198.0 lb

## 2012-05-31 DIAGNOSIS — R600 Localized edema: Secondary | ICD-10-CM

## 2012-05-31 DIAGNOSIS — I10 Essential (primary) hypertension: Secondary | ICD-10-CM

## 2012-05-31 DIAGNOSIS — R609 Edema, unspecified: Secondary | ICD-10-CM

## 2012-05-31 MED ORDER — AMLODIPINE BESYLATE 5 MG PO TABS: 5.0000 mg | ORAL_TABLET | Freq: Every day | ORAL | Status: DC

## 2012-05-31 MED ORDER — TRIAMTERENE-HCTZ 37.5-25 MG PO TABS: 1.0000 | ORAL_TABLET | Freq: Every day | ORAL | Status: DC

## 2012-05-31 NOTE — Progress Notes (Signed)
  Subjective:    Patient ID: Jimmy Rice, male    DOB: Feb 17, 1946, 66 y.o.   MRN: [id number]  HPI  Increased bilateral leg edema. Patient has hypertension. Recent titration amlodipine to 10 mg at night. He remains on losartan 50 mg. No dyspnea. Recently start exercising more. Edema worse late in the day. No dietary change.   Review of Systems  Respiratory: Negative for cough and shortness of breath.   Cardiovascular: Positive for leg swelling. Negative for chest pain.       Objective:   Physical Exam  Constitutional: He appears well-developed and well-nourished.  Cardiovascular: Normal rate and regular rhythm.   Pulmonary/Chest: Effort normal and breath sounds normal. No respiratory distress. He has no wheezes. He has no rales.  Musculoskeletal: He exhibits edema.       Edema-trace to 1 plus bilaterally          Assessment & Plan:  Bilateral leg edema. Probably related to recent titration of amlodipine. Reduce amlodipine back to 5 mg daily. Start Maxzide 1 daily. Continue losartan. Recheck in one month and repeat basic metabolic panel then

## 2012-05-31 NOTE — Telephone Encounter (Signed)
Caller: Daryle/Patient; Patient Name: Jimmy Rice; PCP: Carolann Littler [facility]); Best Callback Phone Number: [phone]  Today, 05/31/2012, pt calling with bilateral ankle swelling since 04/30/2012,  and since ~ 05/17/2012 has noticed swelling in left ankle spreading to lower leg and is twice normal size.  No pain and no known injury. No change in skin color. RN reached See in 4 hr Disposition for new marked swelling per Leg - Non injury protocol. RN scheduled appointment for this morning at  1030  with Dr. Elease Hashimoto.

## 2012-06-03 ENCOUNTER — Telehealth: Payer: Self-pay | Admitting: Family Medicine

## 2012-06-03 NOTE — Telephone Encounter (Signed)
Caller: [name]/Patient; Patient Name: Jimmy Rice; PCP: Carolann Littler [facility]); Best Callback Phone Number: [phone]. Caller has question for MD. Reports he does not want to take the drugs for ED as discussed. But since his labs indicate low testosterone, would the injections work for him. [facility] [address]. He can be reached at above #.

## 2012-06-03 NOTE — Telephone Encounter (Signed)
Testosterone replacement may improve libido and overall sense of well being, but may not necessarily help his ED.  Since he has had increased PSA, I would prefer deferring decision of testosterone replacement to urologist.

## 2012-06-04 NOTE — Telephone Encounter (Signed)
Pt given message on personally identified VM

## 2012-06-10 ENCOUNTER — Ambulatory Visit (AMBULATORY_SURGERY_CENTER): Payer: Medicare PPO | Admitting: *Deleted

## 2012-06-10 VITALS — Ht 73.0 in | Wt 197.6 lb

## 2012-06-10 DIAGNOSIS — Z1211 Encounter for screening for malignant neoplasm of colon: Secondary | ICD-10-CM

## 2012-06-10 MED ORDER — NA SULFATE-K SULFATE-MG SULF 17.5-3.13-1.6 GM/177ML PO SOLN: 1.0000 | Freq: Once | ORAL | Status: DC

## 2012-06-10 NOTE — Progress Notes (Signed)
Pt has no allergy to eggs or soy products  Pt had a colonoscopy a "long time ago" at the [place] but doesn't remember the MD's name.  States he didn't have polyps

## 2012-06-11 ENCOUNTER — Ambulatory Visit: Payer: Medicare PPO | Admitting: Family Medicine

## 2012-06-14 ENCOUNTER — Encounter: Payer: Self-pay | Admitting: Internal Medicine

## 2012-06-24 ENCOUNTER — Ambulatory Visit (AMBULATORY_SURGERY_CENTER): Payer: Medicare PPO | Admitting: Internal Medicine

## 2012-06-24 ENCOUNTER — Encounter: Payer: Self-pay | Admitting: Internal Medicine

## 2012-06-24 VITALS — BP 146/98 | HR 73 | Temp 97.0°F | Resp 16 | Ht 73.0 in | Wt 197.0 lb

## 2012-06-24 DIAGNOSIS — Z1211 Encounter for screening for malignant neoplasm of colon: Secondary | ICD-10-CM

## 2012-06-24 DIAGNOSIS — K5730 Diverticulosis of large intestine without perforation or abscess without bleeding: Secondary | ICD-10-CM

## 2012-06-24 MED ORDER — SODIUM CHLORIDE 0.9 % IV SOLN: 500.0000 mL | INTRAVENOUS | Status: DC

## 2012-06-24 NOTE — Patient Instructions (Addendum)
The colonoscopy showed diverticulosis - thickened muscles and pockets in the colon wall. No polyps or cancer were seen!  Next routine colonoscopy in about 10 years.  Thank you for choosing me and [facility].  Gatha Mayer, MD, FACG  YOU HAD AN ENDOSCOPIC PROCEDURE TODAY AT [facility]: Refer to the procedure report that was given to you for any specific questions about what was found during the examination.  If the procedure report does not answer your questions, please call your gastroenterologist to clarify.  If you requested that your care partner not be given the details of your procedure findings, then the procedure report has been included in a sealed envelope for you to review at your convenience later.  YOU SHOULD EXPECT: Some feelings of bloating in the abdomen. Passage of more gas than usual.  Walking can help get rid of the air that was put into your GI tract during the procedure and reduce the bloating. If you had a lower endoscopy (such as a colonoscopy or flexible sigmoidoscopy) you may notice spotting of blood in your stool or on the toilet paper. If you underwent a bowel prep for your procedure, then you may not have a normal bowel movement for a few days.  DIET: Your first meal following the procedure should be a light meal and then it is ok to progress to your normal diet.  A half-sandwich or bowl of soup is an example of a good first meal.  Heavy or fried foods are harder to digest and may make you feel nauseous or bloated.  Likewise meals heavy in dairy and vegetables can cause extra gas to form and this can also increase the bloating.  Drink plenty of fluids but you should avoid alcoholic beverages for 24 hours.  ACTIVITY: Your care partner should take you home directly after the procedure.  You should plan to take it easy, moving slowly for the rest of the day.  You can resume normal activity the day after the procedure however you should NOT  DRIVE or use heavy machinery for 24 hours (because of the sedation medicines used during the test).    SYMPTOMS TO REPORT IMMEDIATELY: A gastroenterologist can be reached at any hour.  During normal business hours, 8:30 AM to 5:00 PM Monday through Friday, call [phone].  After hours and on weekends, please call the GI answering service at [phone] who will take a message and have the physician on call contact you.   Following lower endoscopy (colonoscopy or flexible sigmoidoscopy):  Excessive amounts of blood in the stool  Significant tenderness or worsening of abdominal pains  Swelling of the abdomen that is new, acute  Fever of 100F or higher  Following upper endoscopy (EGD)  Vomiting of blood or coffee ground material  New chest pain or pain under the shoulder blades  Painful or persistently difficult swallowing  New shortness of breath  Fever of 100F or higher  Black, tarry-looking stools  FOLLOW UP: If any biopsies were taken you will be contacted by phone or by letter within the next 1-3 weeks.  Call your gastroenterologist if you have not heard about the biopsies in 3 weeks.  Our staff will call the home number listed on your records the next business day following your procedure to check on you and address any questions or concerns that you may have at that time regarding the information given to you following your procedure. This is a courtesy call and  so if there is no answer at the home number and we have not heard from you through the emergency physician on call, we will assume that you have returned to your regular daily activities without incident.  SIGNATURES/CONFIDENTIALITY: You and/or your care partner have signed paperwork which will be entered into your electronic medical record.  These signatures attest to the fact that that the information above on your After Visit Summary has been reviewed and is understood.  Full responsibility of the confidentiality of  this discharge information lies with you and/or your care-partner.   Please follow all discharge instructions given to you by the recovery room nurse. If you have any questions or problems after discharge please call one of the numbers listed above. You will receive a phone call in the am to see how you are doing and answer any questions you may have. Thank you for choosing [facility] for your health care needs.

## 2012-06-24 NOTE — Progress Notes (Signed)
Patient did not experience any of the following events: a burn prior to discharge; a fall within the facility; wrong site/side/patient/procedure/implant event; or a hospital transfer or hospital admission upon discharge from the facility. (G8907) Patient did not have preoperative order for IV antibiotic SSI prophylaxis. (G8918)  

## 2012-06-24 NOTE — Op Note (Signed)
[facility]  [address] [place], [id number]   COLONOSCOPY PROCEDURE REPORT  PATIENT: Jimmy Rice, Jimmy Rice  MR#: [id number] BIRTHDATE: 06/21/1946 , 50  yrs. old GENDER: Male ENDOSCOPIST: Gatha Mayer, MD, [facility] REFERRED [id number] Elease Hashimoto, M.D. PROCEDURE DATE:  06/24/2012 PROCEDURE:   Colonoscopy, diagnostic ASA CLASS:   Class II INDICATIONS:average risk screening. MEDICATIONS: propofol (Diprivan) 200mg  IV, MAC sedation, administered by CRNA, and These medications were titrated to patient response per physician's verbal order  DESCRIPTION OF PROCEDURE:   After the risks benefits and alternatives of the procedure were thoroughly explained, informed consent was obtained.  A digital rectal exam revealed no abnormalities of the rectum and A digital rectal exam revealed the prostate was not enlarged.   The LB CF-[id number]  endoscope was introduced through the anus and advanced to the cecum, which was identified by both the appendix and ileocecal valve. No adverse events experienced.   The quality of the prep was Suprep excellent The instrument was then slowly withdrawn as the colon was fully examined.      COLON FINDINGS: There was moderate diverticulosis noted throughout the entire examined colon with associated muscular hypertrophy. The colon mucosa was otherwise normal.  Retroflexed views revealed no abnormalities. The time to cecum=2 minutes 45 seconds. Withdrawal time=8 minutes 45 seconds.  The scope was withdrawn and the procedure completed. COMPLICATIONS: There were no complications.  ENDOSCOPIC IMPRESSION: 1.   There was moderate diverticulosis noted throughout the entire examined colon 2.   The colon mucosa was otherwise normal with excellent prep  RECOMMENDATIONS: repeat Colonscopy in 10 years.   eSigned:  Gatha Mayer, MD, [facility] 06/24/2012 11:29 AM   cc: Carolann Littler, MD and The Patient

## 2012-06-25 ENCOUNTER — Telehealth: Payer: Self-pay | Admitting: *Deleted

## 2012-06-25 NOTE — Telephone Encounter (Signed)
  Follow up Call-  Call back number 06/24/2012  Post procedure Call Back phone  # [phone] cell  Permission to leave phone message Yes     Patient questions:  Message left to call if necessary.

## 2012-07-01 ENCOUNTER — Ambulatory Visit (INDEPENDENT_AMBULATORY_CARE_PROVIDER_SITE_OTHER): Payer: Medicare PPO | Admitting: Family Medicine

## 2012-07-26 ENCOUNTER — Encounter: Payer: Self-pay | Admitting: Family Medicine

## 2012-07-26 ENCOUNTER — Ambulatory Visit (INDEPENDENT_AMBULATORY_CARE_PROVIDER_SITE_OTHER)
Admission: RE | Admit: 2012-07-26 | Discharge: 2012-07-26 | Disposition: A | Discharge status: Home / Self Care | Payer: Medicare PPO | Source: Ambulatory Visit | Attending: Family Medicine | Admitting: Family Medicine

## 2012-07-26 ENCOUNTER — Ambulatory Visit (INDEPENDENT_AMBULATORY_CARE_PROVIDER_SITE_OTHER): Payer: Medicare PPO | Admitting: Family Medicine

## 2012-07-26 VITALS — BP 122/72 | Temp 98.0°F | Wt 197.0 lb

## 2012-07-26 DIAGNOSIS — IMO0002 Reserved for concepts with insufficient information to code with codable children: Secondary | ICD-10-CM

## 2012-07-26 DIAGNOSIS — E876 Hypokalemia: Secondary | ICD-10-CM

## 2012-07-26 DIAGNOSIS — I10 Essential (primary) hypertension: Secondary | ICD-10-CM

## 2012-07-26 DIAGNOSIS — M5416 Radiculopathy, lumbar region: Secondary | ICD-10-CM

## 2012-07-26 DIAGNOSIS — R972 Elevated prostate specific antigen [PSA]: Secondary | ICD-10-CM

## 2012-07-26 DIAGNOSIS — Z87898 Personal history of other specified conditions: Secondary | ICD-10-CM

## 2012-07-26 LAB — BASIC METABOLIC PANEL
CO2: 30 mEq/L (ref 19–32)
Chloride: 101 mEq/L (ref 96–112)
Glucose, Bld: 98 mg/dL (ref 70–99)
Potassium: 4.6 mEq/L (ref 3.5–5.1)
Sodium: 138 mEq/L (ref 135–145)

## 2012-07-26 IMAGING — CR DG LUMBAR SPINE COMPLETE 4+V
5 series · 5 of 5 positions shown · non-contrast
Comparison: None.

CLINICAL DATA: Chronic right back pain

LUMBAR SPINE - COMPLETE 4+ VIEW

[view not recorded (1 of 5)]
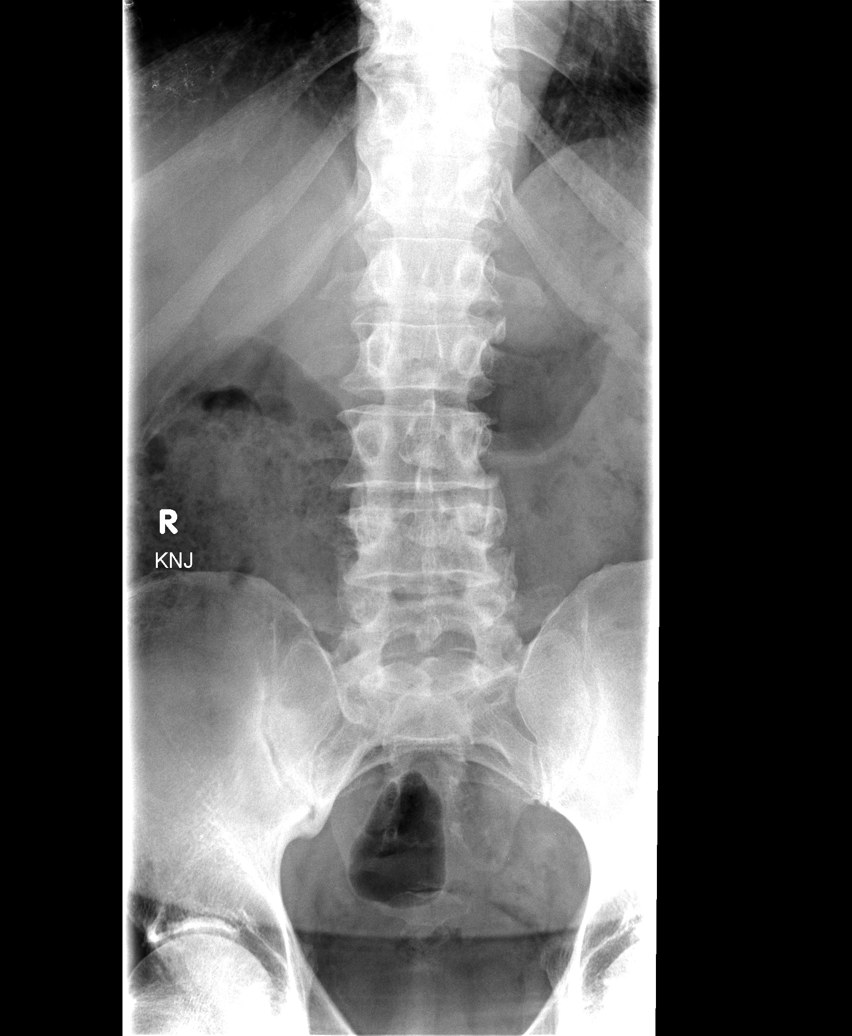

[view not recorded (2 of 5)]
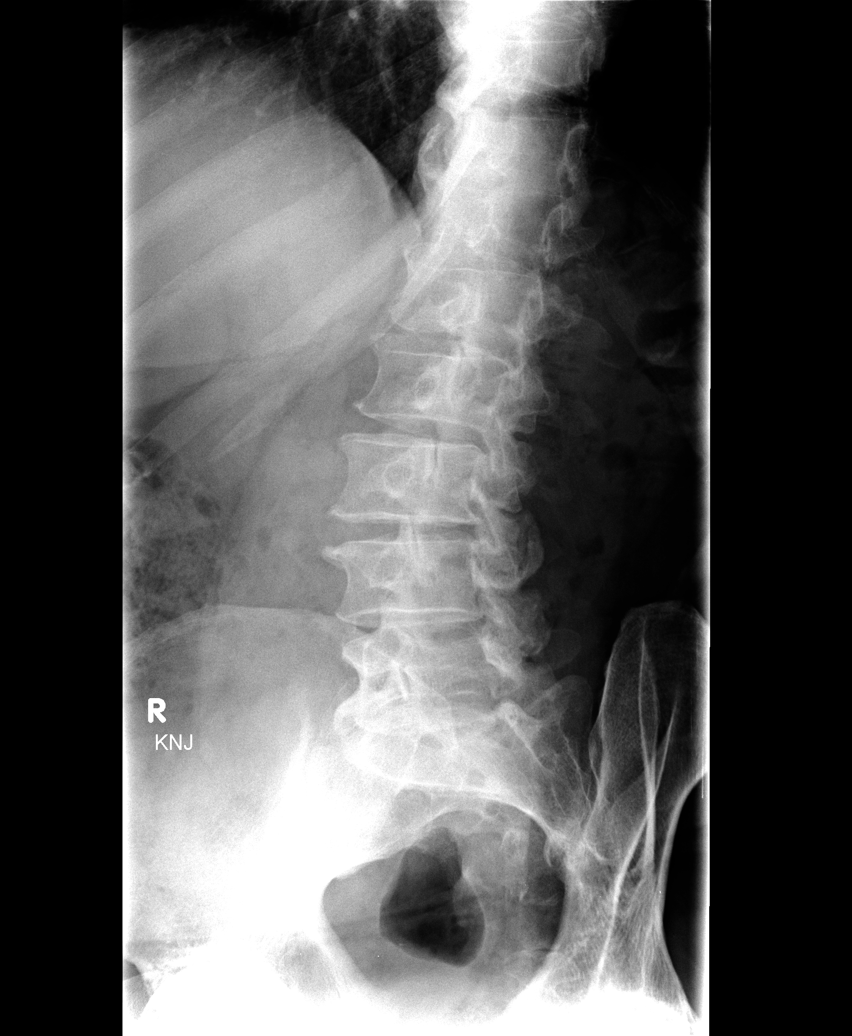

[view not recorded (3 of 5)]
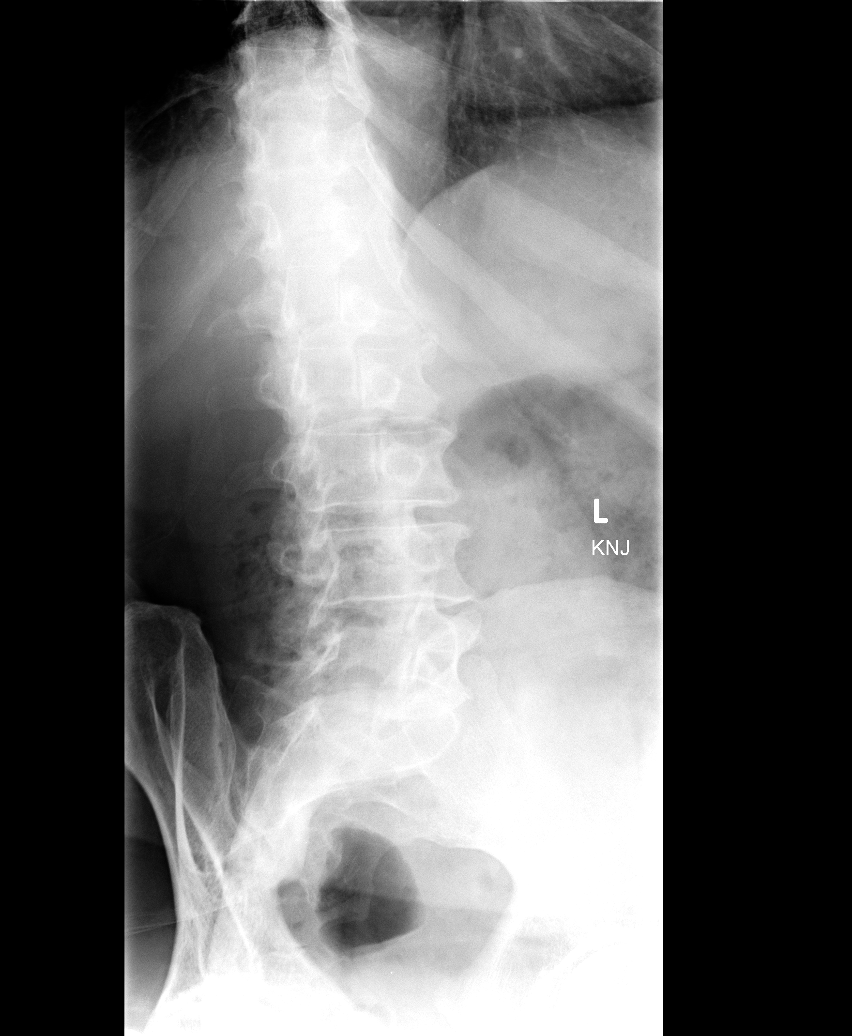

[view not recorded (4 of 5)]
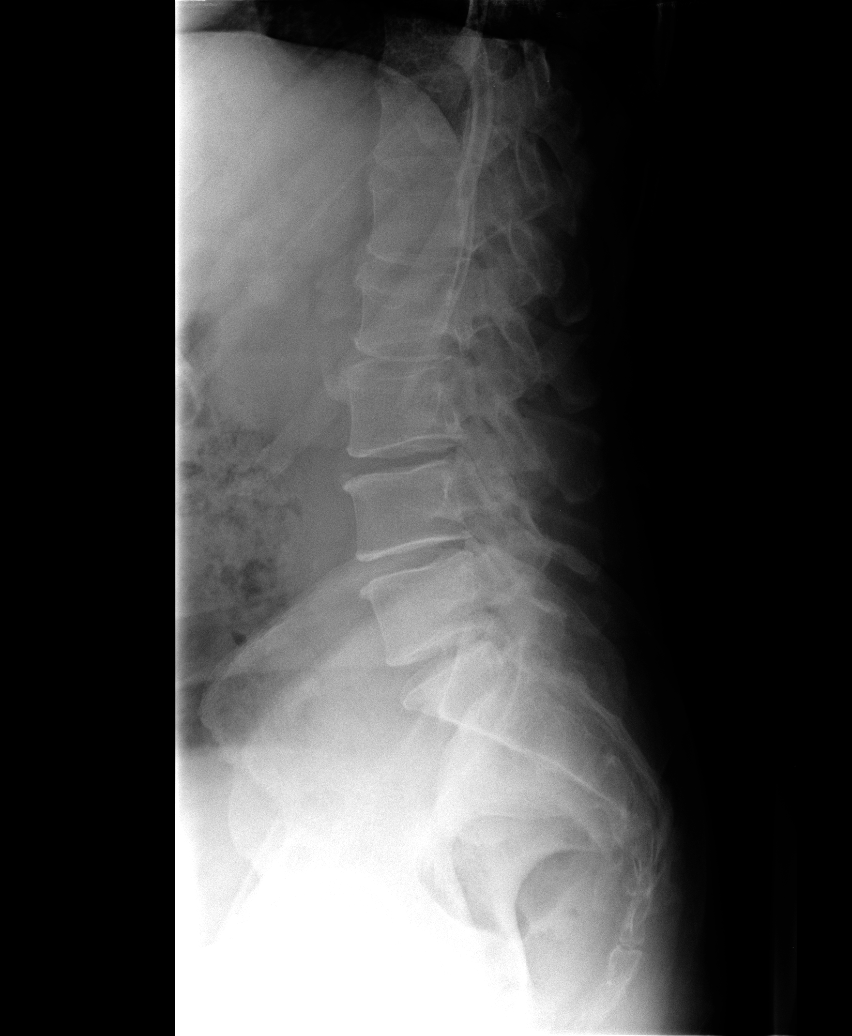

[view not recorded (5 of 5)]
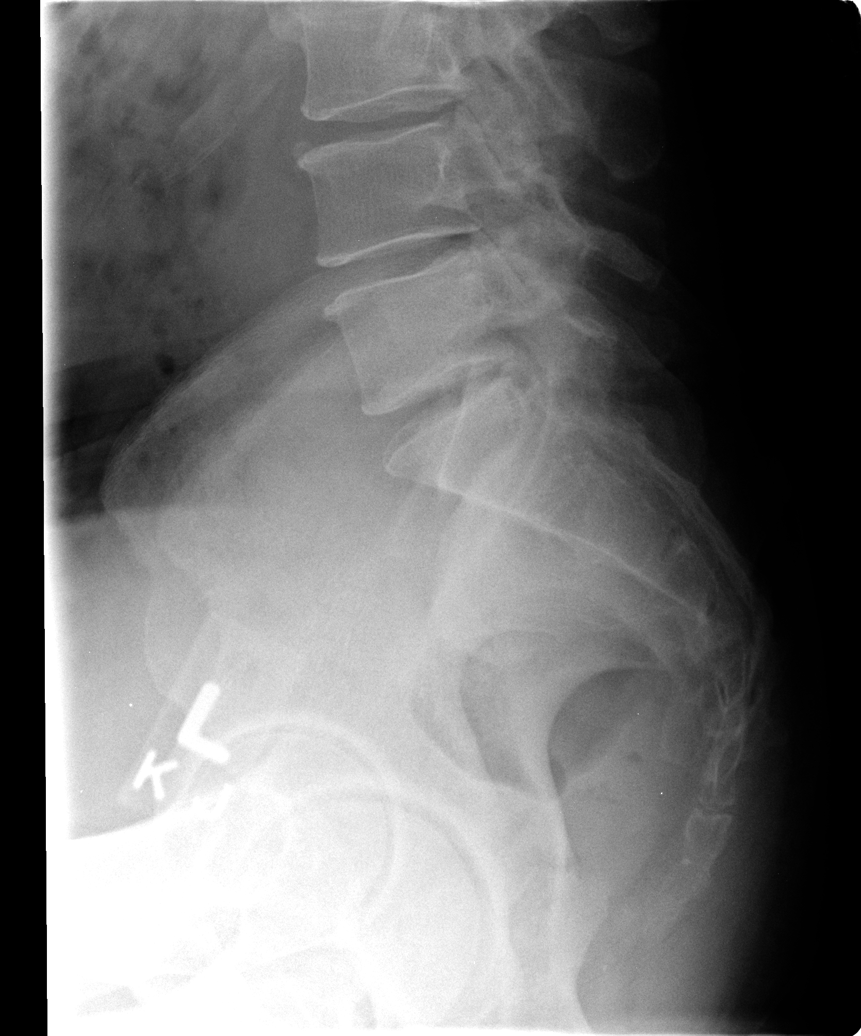

[5 of 5 positions shown; findings below may reference images not displayed]

FINDINGS: Five lumbar-type vertebral bodies.

Normal lumbar lordosis.

No evidence of fracture or dislocation. Vertebral body heights are
maintained.

Mild multilevel degenerative changes.

Visualized bony pelvis appears intact.
IMPRESSION: No fracture or dislocation is seen.

Mild multilevel degenerative changes.

## 2012-07-26 NOTE — Progress Notes (Signed)
  Subjective:    Patient ID: Jimmy Rice, male    DOB: 12/09/1945, 66 y.o.   MRN: [id number]  HPI  Patient seen for several issues as follows:  History of elevated PSA. Recently had PSA 7 per patient. Followed by urologist in [facility]. History of prostate biopsies x3 with no evidence for malignancy. Recently started on finasteride and Flomax. He is requesting referral to local urologist-here in [place]. No major recent obstructive symptoms.  Right low back pain off and on for several months. Becoming more progressive over time. Frequent ache 4/10 at rest occasionally 8/10 at worst. Radiates toward right knee. Somewhat better with walking and worse with sitting and sleep. No weakness. No numbness. No urine incontinence. No alleviating factors.  History of recent mild hypokalemia. We started triamterene hydrochlorothiazide with recent potassium 3.3. Blood pressure very well controlled.  Past Medical History  Diagnosis Date  . HYPOGONADISM 01/18/2009  . HYPERTENSION 01/18/2009  . GERD 01/18/2009  . BENIGN PROSTATIC HYPERTROPHY, HX OF 04/04/2009  . Allergy   . Asthma     OA  . Blood transfusion   . Cataract   . Chronic kidney disease     cysts on kidneys   Past Surgical History  Procedure Date  . Colonoscopy   . Inguinal hernia repair   . Nasal sinus surgery     reports that he has quit smoking. He has never used smokeless tobacco. He reports that he does not drink alcohol or use illicit drugs. family history includes Stomach cancer in his paternal aunt.  There is no history of Colon cancer, and Esophageal cancer, and Rectal cancer, . Allergies  Allergen Reactions  . Flomax (Tamsulosin Hcl)     Difficulty breathing, nasal stuffiness      Review of Systems  Constitutional: Negative for fatigue and unexpected weight change.  Eyes: Negative for visual disturbance.  Respiratory: Negative for cough, chest tightness and shortness of breath.   Cardiovascular: Negative for  chest pain, palpitations and leg swelling.  Genitourinary: Positive for dysuria. Negative for hematuria.  Neurological: Negative for dizziness, syncope, weakness, light-headedness and headaches.       Objective:   Physical Exam  Constitutional: He appears well-developed and well-nourished.  Neck: Neck supple. No thyromegaly present.  Cardiovascular: Normal rate and regular rhythm.   Pulmonary/Chest: Effort normal and breath sounds normal. No respiratory distress. He has no wheezes. He has no rales.  Musculoskeletal: He exhibits no edema.       Straight leg raises are negative bilaterally  Neurological:       Full-strength lower extremities. Knee and ankle reflexes are symmetric. Normal sensory function.          Assessment & Plan:  #1 history of hypertension. Well controlled. History of mild hypokalemia. Recent transition to Maxzide. Recheck basic metabolic panel #2 low back pain with right lumbar radiculopathy symptoms. Nonfocal neurologic exam. Given duration of symptoms start with lumbar spine films. #3 elevated PSA. Patient requesting local urologist. Burnis Medin set up with [facility]

## 2012-07-27 NOTE — Progress Notes (Signed)
Quick Note:  Pt informed ______ 

## 2012-07-27 NOTE — Progress Notes (Signed)
Quick Note:  Pt informed, he will let us know if he wants to have some PT ______

## 2012-08-03 ENCOUNTER — Telehealth: Payer: Self-pay | Admitting: Family Medicine

## 2012-08-03 NOTE — Telephone Encounter (Signed)
Patient has a referral for Urology. After checking with his insurance, the referral is for someone who is not within his network. The patient found a urologist who is in network. He would like a new referral to:  Dr. Anson [place]. [place], [place] Office ph#: [phone]  Thank you.

## 2012-08-04 NOTE — Telephone Encounter (Signed)
Taken Care of- redirected Referral to Dr. Lyndel Pleasure

## 2012-08-26 ENCOUNTER — Other Ambulatory Visit: Payer: Self-pay | Admitting: Family Medicine

## 2012-08-30 ENCOUNTER — Telehealth: Payer: Self-pay | Admitting: Family Medicine

## 2012-08-30 NOTE — Telephone Encounter (Signed)
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

## 2012-08-30 NOTE — Telephone Encounter (Signed)
Patient needs another DOT physical card.  He states he had a physical in august and also last October.  Please contact patient about what he would need for this to be completed.

## 2012-08-31 NOTE — Telephone Encounter (Signed)
Pt states he only needs the card for proof of a physical earlier this year, ca

## 2012-08-31 NOTE — Telephone Encounter (Signed)
Pt had CPX on 05-12-12, card only is needed.  Pt informed card will be ready to pick-up

## 2012-08-31 NOTE — Telephone Encounter (Signed)
Ok to complete

## 2012-09-17 ENCOUNTER — Ambulatory Visit (INDEPENDENT_AMBULATORY_CARE_PROVIDER_SITE_OTHER): Payer: Medicare PPO | Admitting: Family Medicine

## 2012-09-17 ENCOUNTER — Encounter: Payer: Self-pay | Admitting: Family Medicine

## 2012-09-17 VITALS — BP 110/80 | Temp 97.0°F | Wt 201.0 lb

## 2012-09-17 DIAGNOSIS — M5416 Radiculopathy, lumbar region: Secondary | ICD-10-CM

## 2012-09-17 DIAGNOSIS — IMO0002 Reserved for concepts with insufficient information to code with codable children: Secondary | ICD-10-CM

## 2012-09-17 MED ORDER — PREDNISOLONE SODIUM PHOSPHATE 10 MG PO TBDP: ORAL_TABLET | ORAL | Status: DC

## 2012-09-17 NOTE — Progress Notes (Signed)
  Subjective:    Patient ID: Jimmy Rice, male    DOB: 15-Aug-1946, 66 y.o.   MRN: [id number]  HPI  Persistent upper right thigh and pelvic pain. Patient has remote history of right inguinal hernia and was concerned about recurrence, though he has not noted any recurrent swelling. Refer to prior note. He had right lumbar radiculopathy symptoms. Plain films revealed nonspecific degenerative changes. He still has occasional pain which radiates down the leg but recent pain has been mostly radiating into the right upper thigh region. 5/10 in intensity. Dull and relatively constant. Occasionally radiates from the back. No weakness. No numbness. No urine or stool incontinence. No stool changes  Recent colonoscopy unremarkable   Review of Systems  Constitutional: Negative for fever and chills.  Gastrointestinal: Negative for diarrhea, constipation and blood in stool.  Genitourinary: Negative for dysuria.  Musculoskeletal: Positive for back pain.  Neurological: Negative for weakness and numbness.  Hematological: Negative for adenopathy.       Objective:   Physical Exam  Constitutional: He appears well-developed and well-nourished.  Cardiovascular: Normal rate and regular rhythm.   Pulmonary/Chest: Effort normal and breath sounds normal. No respiratory distress. He has no wheezes. He has no rales.  Abdominal: Soft. Bowel sounds are normal. He exhibits no distension and no mass. There is no tenderness. There is no rebound and no guarding.  Genitourinary:       No evidence for recurrent right inguinal hernia. No significant right inguinal adenopathy. No testicle mass  Musculoskeletal: He exhibits no edema.  Neurological:       Straight leg raise negative on the right. He has full-strength with plantar flexion, dorsiflexion, and knee extension as well as hip flexion. Trace reflexes knee and ankle bilaterally. Normal sensory to touch          Assessment & Plan:  Right inguinal/upper thigh  pain. Suspect this is related to right lumbar radiculopathy. Overall, his pain is actually somewhat better compared to last visit. We've recommended prednisone trial over the next week. MRI lumbar spine to further assess if not seeing significant resolution with this over the next couple of weeks

## 2012-12-02 ENCOUNTER — Encounter: Payer: Self-pay | Admitting: Family Medicine

## 2012-12-02 ENCOUNTER — Ambulatory Visit (INDEPENDENT_AMBULATORY_CARE_PROVIDER_SITE_OTHER): Payer: Medicare PPO | Admitting: Family Medicine

## 2012-12-02 VITALS — BP 130/90 | Temp 97.5°F | Wt 200.0 lb

## 2012-12-02 DIAGNOSIS — R972 Elevated prostate specific antigen [PSA]: Secondary | ICD-10-CM

## 2012-12-02 DIAGNOSIS — N189 Chronic kidney disease, unspecified: Secondary | ICD-10-CM

## 2012-12-02 DIAGNOSIS — N183 Chronic kidney disease, stage 3 unspecified: Secondary | ICD-10-CM | POA: Insufficient documentation

## 2012-12-02 DIAGNOSIS — I10 Essential (primary) hypertension: Secondary | ICD-10-CM

## 2012-12-02 MED ORDER — VARDENAFIL HCL 20 MG PO TABS: 20.0000 mg | ORAL_TABLET | Freq: Every day | ORAL | Status: DC | PRN

## 2012-12-02 NOTE — Progress Notes (Signed)
  Subjective:    Patient ID: Jimmy Rice, male    DOB: [birth year], 67 y.o.   MRN: [id number]  HPI Patient seen for followup regarding some recent [facility]. Creatinine 1.73 with BUN 25.  Patient's had history of chronic kidney disease with creatinine 1.5 back in 2012.  He reports family history of polycystic kidney disease in his father. Patient thinks he had ultrasound of kidneys for 5 years ago by urologist but is not sure.  He has history of hypertension which is been well controlled. No history of diabetes. No nonsteroidal use. Does have history of BPH and currently not treated with any medications. No recent obstructive symptoms. Only current medications are amlodipine, triamterene HCTZ, losartan, and as needed Levitra  Requesting refills Levitra which he uses as needed for erectile dysfunction  Past Medical History  Diagnosis Date  . HYPOGONADISM 01/18/2009  . HYPERTENSION 01/18/2009  . GERD 01/18/2009  . BENIGN PROSTATIC HYPERTROPHY, HX OF 04/04/2009  . Allergy   . Asthma     OA  . Blood transfusion   . Cataract   . Chronic kidney disease     cysts on kidneys   Past Surgical History  Procedure Laterality Date  . Colonoscopy    . Nasal sinus surgery    . Inguinal hernia repair      reports that he has quit smoking. He has never used smokeless tobacco. He reports that he does not drink alcohol or use illicit drugs. family history includes Stomach cancer in his paternal aunt.  There is no history of Colon cancer, and Esophageal cancer, and Rectal cancer, . Allergies  Allergen Reactions  . Flomax (Tamsulosin Hcl)     Difficulty breathing, nasal stuffiness      Review of Systems  Constitutional: Negative for fatigue and unexpected weight change.  Eyes: Negative for visual disturbance.  Respiratory: Negative for cough, chest tightness and shortness of breath.   Cardiovascular: Negative for chest pain, palpitations and leg swelling.   Endocrine: Negative for polyuria.  Genitourinary: Negative for dysuria, hematuria and difficulty urinating.  Neurological: Negative for dizziness, syncope, weakness, light-headedness and headaches.       Objective:   Physical Exam  Constitutional: He appears well-developed and well-nourished.  Neck: Neck supple. No thyromegaly present.  Cardiovascular: Normal rate and regular rhythm.   Pulmonary/Chest: Effort normal and breath sounds normal. No respiratory distress. He has no wheezes. He has no rales.  Musculoskeletal: He exhibits no edema.          Assessment & Plan:  #1 chronic kidney disease. Question related to hypertensive nephrosclerosis-though his blood pressures been relatively well controlled in recent years. No history of diabetes. No current obstructive urinary symptoms. Questionable family history polycystic kidneys in father.  Set up nephrology referral. Avoid nonsteroidals. #2 erectile dysfunction. Refills of Levitra given #3 history of chronic elevated PSA. PSA is actually down compared to last year. Had previous biopsies multiple times per urology negative for cancer. No current obstructive urinary symptoms

## 2012-12-07 ENCOUNTER — Encounter: Payer: Self-pay | Admitting: Family Medicine

## 2013-01-05 ENCOUNTER — Other Ambulatory Visit: Payer: Self-pay | Admitting: Family Medicine

## 2013-06-09 ENCOUNTER — Encounter: Payer: Self-pay | Admitting: Nephrology

## 2013-06-10 ENCOUNTER — Other Ambulatory Visit (INDEPENDENT_AMBULATORY_CARE_PROVIDER_SITE_OTHER): Payer: Medicare HMO

## 2013-06-10 DIAGNOSIS — I10 Essential (primary) hypertension: Secondary | ICD-10-CM

## 2013-06-10 DIAGNOSIS — Z0000 Encounter for general adult medical examination without abnormal findings: Secondary | ICD-10-CM

## 2013-06-10 DIAGNOSIS — R972 Elevated prostate specific antigen [PSA]: Secondary | ICD-10-CM

## 2013-06-10 DIAGNOSIS — Z87898 Personal history of other specified conditions: Secondary | ICD-10-CM

## 2013-06-10 LAB — CBC WITH DIFFERENTIAL/PLATELET
Basophils Absolute: 0 10*3/uL (ref 0.0–0.1)
Basophils Relative: 0.8 % (ref 0.0–3.0)
Eosinophils Relative: 8.9 % — ABNORMAL HIGH (ref 0.0–5.0)
HCT: 41.5 % (ref 39.0–52.0)
Hemoglobin: 13.6 g/dL (ref 13.0–17.0)
Lymphs Abs: 1.3 10*3/uL (ref 0.7–4.0)
Monocytes Relative: 9.1 % (ref 3.0–12.0)
Neutro Abs: 1.9 10*3/uL (ref 1.4–7.7)
RBC: 4.59 Mil/uL (ref 4.22–5.81)
RDW: 13.1 % (ref 11.5–14.6)

## 2013-06-10 LAB — HEPATIC FUNCTION PANEL
ALT: 17 U/L (ref 0–53)
AST: 16 U/L (ref 0–37)
Albumin: 4.1 g/dL (ref 3.5–5.2)
Alkaline Phosphatase: 52 U/L (ref 39–117)
Total Protein: 7 g/dL (ref 6.0–8.3)

## 2013-06-10 LAB — BASIC METABOLIC PANEL
GFR: 62.31 mL/min (ref >60.00)
Glucose, Bld: 102 mg/dL — ABNORMAL HIGH (ref 70–99)
Potassium: 4.3 mEq/L (ref 3.5–5.1)
Sodium: 137 mEq/L (ref 135–145)

## 2013-06-10 LAB — POCT URINALYSIS DIPSTICK
Glucose, UA: NEGATIVE
Protein, UA: NEGATIVE
Spec Grav, UA: 1.015
Urobilinogen, UA: 1
pH, UA: 7

## 2013-06-10 LAB — TSH: TSH: 2.24 u[IU]/mL (ref 0.35–5.50)

## 2013-06-10 LAB — LIPID PANEL
LDL Cholesterol: 116 mg/dL — ABNORMAL HIGH (ref 0–99)
Total CHOL/HDL Ratio: 4
Triglycerides: 135 mg/dL (ref 0.0–149.0)

## 2013-06-17 ENCOUNTER — Ambulatory Visit (INDEPENDENT_AMBULATORY_CARE_PROVIDER_SITE_OTHER): Payer: Medicare PPO | Admitting: Family Medicine

## 2013-06-17 ENCOUNTER — Encounter: Payer: Self-pay | Admitting: Family Medicine

## 2013-06-17 VITALS — BP 112/80 | HR 91 | Temp 97.8°F | Ht 71.0 in | Wt 201.0 lb

## 2013-06-17 DIAGNOSIS — I129 Hypertensive chronic kidney disease with stage 1 through stage 4 chronic kidney disease, or unspecified chronic kidney disease: Secondary | ICD-10-CM

## 2013-06-17 DIAGNOSIS — Z0000 Encounter for general adult medical examination without abnormal findings: Secondary | ICD-10-CM

## 2013-06-17 DIAGNOSIS — I10 Essential (primary) hypertension: Secondary | ICD-10-CM

## 2013-06-17 DIAGNOSIS — N189 Chronic kidney disease, unspecified: Secondary | ICD-10-CM

## 2013-06-17 DIAGNOSIS — Z23 Encounter for immunization: Secondary | ICD-10-CM

## 2013-06-17 MED ORDER — LOSARTAN POTASSIUM 50 MG PO TABS: 50.0000 mg | ORAL_TABLET | Freq: Every day | ORAL | Status: DC

## 2013-06-17 MED ORDER — AMLODIPINE BESYLATE 5 MG PO TABS: 5.0000 mg | ORAL_TABLET | Freq: Every day | ORAL | Status: DC

## 2013-06-17 NOTE — Progress Notes (Signed)
Subjective:    Patient ID: Jimmy Rice, male    DOB: 1946-02-27, 67 y.o.   MRN: [id number]  HPI Patient seen for complete physical. Chronic problems include history of hypertension, chronic kidney disease stage 2-3, elevated PSA, GERD, hypogonadism. He's been followed by urology for elevated PSA. PSA recently checked and stable. His free PSA was normal. He denies any major obstructive urinary symptoms at this time. He is seeing nephrologist recently with baseline creatinine around 1.5. They have not made any medication changes. He currently takes losartan and amlodipine and blood pressures have been well controlled. Has occasional nighttime leg edema and nephrologist suggested second dose of losartan which has helped edema but this caused orthostasis.  Immunizations up-to-date but needs flu vaccine. Colonoscopy up to date. Generally feels well. He has some osteoarthritis issues which he copes with daily.  Lab Results  Component Value Date   CHOL 191 06/10/2013   HDL 47.60 06/10/2013   LDLCALC 116* 06/10/2013   LDLDIRECT 132.7 02/10/2011   TRIG 135.0 06/10/2013   CHOLHDL 4 06/10/2013   Lab Results  Component Value Date   PSA 6.72* 06/10/2013   PSA 5.62* 05/05/2012   PSA 6.54* 12/27/2010      Past Medical History  Diagnosis Date  . HYPOGONADISM 01/18/2009  . HYPERTENSION 01/18/2009  . GERD 01/18/2009  . BENIGN PROSTATIC HYPERTROPHY, HX OF 04/04/2009  . Allergy   . Asthma     OA  . Blood transfusion   . Cataract   . Chronic kidney disease     cysts on kidneys   Past Surgical History  Procedure Laterality Date  . Colonoscopy    . Nasal sinus surgery    . Inguinal hernia repair      reports that he has quit smoking. He has never used smokeless tobacco. He reports that he does not drink alcohol or use illicit drugs. family history includes Stomach cancer in his paternal aunt. There is no history of Colon cancer, Esophageal cancer, or Rectal cancer. Allergies  Allergen Reactions  .  Flomax [Tamsulosin Hcl]     Difficulty breathing, nasal stuffiness   1.  Risk factors based on Past Medical , Social, and Family history reviewed and as indicated above 2.  Limitations in physical activities stays very active. No recent falls 3.  Depression/mood denies any depression or anxiety issues 4.  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 Recent audiometry assessment with no indication for hearing aid yet 5.  ADLs independent in all 6.  Cognitive function (orientation to time and place, language, writing, speech,memory) no cognitive deficits. Speech and language intact 7.  Home Safety no issues 8.  Height, weight, and visual acuity. All stable 9.  Counseling discussed the importance of regular ongoing exercise. 10. Recommendation of preventive services. Flu vaccine given. Other immunizations up to date 40. Labs based on risk factors labs as above reviewed 12. Care Plan as above    Review of Systems  Constitutional: Negative for appetite change, fatigue and unexpected weight change.  Eyes: Negative for visual disturbance.  Respiratory: Negative for cough, chest tightness and shortness of breath.   Cardiovascular: Negative for chest pain, palpitations and leg swelling.  Gastrointestinal: Negative for nausea, vomiting, abdominal pain and diarrhea.  Endocrine: Negative for polydipsia and polyuria.  Genitourinary: Negative for dysuria.  Musculoskeletal: Positive for arthralgias.  Skin: Negative for rash.  Neurological: Negative for dizziness, syncope, weakness, light-headedness and headaches.  Hematological: Negative for adenopathy.  Psychiatric/Behavioral: Negative for confusion and dysphoric  mood.       Objective:   Physical Exam  Constitutional: He is oriented to person, place, and time. He appears well-developed and well-nourished. No distress.  HENT:  Head: Normocephalic and atraumatic.  Right Ear: External ear normal.  Left Ear: External ear normal.   Mouth/Throat: Oropharynx is clear and moist.  Eyes: Conjunctivae and EOM are normal. Pupils are equal, round, and reactive to light.  Neck: Normal range of motion. Neck supple. No thyromegaly present.  Cardiovascular: Normal rate, regular rhythm and normal heart sounds.   No murmur heard. Pulmonary/Chest: No respiratory distress. He has no wheezes. He has no rales.  Abdominal: Soft. Bowel sounds are normal. He exhibits no distension and no mass. There is no tenderness. There is no rebound and no guarding.  Genitourinary:  Deferred as he recently saw urologist.  Musculoskeletal: He exhibits no edema.  Lymphadenopathy:    He has no cervical adenopathy.  Neurological: He is alert and oriented to person, place, and time. He displays normal reflexes. No cranial nerve deficit.  Skin: No rash noted.  Psychiatric: He has a normal mood and affect.          Assessment & Plan:  #1 health maintenance. Labs reviewed with patient and all stable. Flu vaccine given. Pneumovax up-to-date. Other immunizations are up-to-date. Colonoscopy up to date. #2 hypertension. Stable. Refill medications for one year #3 chronic kidney disease which is stable with creatinine 1.5. Avoid nonsteroidals. Continue tight blood pressure control. He has no history of diabetes.

## 2013-07-20 ENCOUNTER — Encounter: Payer: Self-pay | Admitting: Family Medicine

## 2013-07-20 ENCOUNTER — Ambulatory Visit (INDEPENDENT_AMBULATORY_CARE_PROVIDER_SITE_OTHER): Payer: Medicare PPO | Admitting: Family Medicine

## 2013-07-20 VITALS — BP 128/72 | HR 75 | Temp 97.8°F | Wt 200.0 lb

## 2013-07-20 DIAGNOSIS — R319 Hematuria, unspecified: Secondary | ICD-10-CM

## 2013-07-20 LAB — POCT URINALYSIS DIPSTICK
Glucose, UA: NEGATIVE
Nitrite, UA: NEGATIVE
Urobilinogen, UA: 0.2
pH, UA: 6

## 2013-07-20 NOTE — Patient Instructions (Signed)
Hematuria, Adult Hematuria (blood in your urine) can be caused by a bladder infection (cystitis), kidney infection (pyelonephritis), prostate infection (prostatitis), or kidney stone. Infections will usually respond to antibiotics (medications which kill germs), and a kidney stone will usually pass through your urine without further treatment. If you were put on antibiotics, take all the medicine until gone. You may feel better in a few days, but take all of your medicine or the infection may not respond and become more difficult to treat. If antibiotics were not given, an infection did not cause the blood in the urine. A further work up to find out the reason may be needed. HOME CARE INSTRUCTIONS   Drink lots of fluid, 3 to 4 quarts a day. If you have been diagnosed with an infection, cranberry juice is especially recommended, in addition to large amounts of water.  Avoid caffeine, tea, and carbonated beverages, because they tend to irritate the bladder.  Avoid alcohol as it may irritate the prostate.  Only take over-the-counter or prescription medicines for pain, discomfort, or fever as directed by your caregiver.  If you have been diagnosed with a kidney stone follow your caregivers instructions regarding straining your urine to catch the stone. TO PREVENT FURTHER INFECTIONS:  Empty the bladder often. Avoid holding urine for long periods of time.  After a bowel movement, women should cleanse front to back. Use each tissue only once.  Empty the bladder before and after sexual intercourse if you are a male.  Return to your caregiver if you develop back pain, fever, nausea (feeling sick to your stomach), vomiting, or your symptoms (problems) are not better in 3 days. Return sooner if you are getting worse. If you have been requested to return for further testing make sure to keep your appointments. If an infection is not the cause of blood in your urine, X-rays may be required. Your caregiver  will discuss this with you. SEEK IMMEDIATE MEDICAL CARE IF:   You have a persistent fever over 102 F (38.9 C).  You develop severe vomiting and are unable to keep the medication down.  You develop severe back or abdominal pain despite taking your medications.  You begin passing a large amount of blood or clots in your urine.  You feel extremely weak or faint, or pass out. MAKE SURE YOU:   Understand these instructions.  Will watch your condition.  Will get help right away if you are not doing well or get worse. Document Released: 09/22/2005 Document Revised: 12/15/2011 Document Reviewed: 05/11/2008 [facility] Patient Information 2014 [place].

## 2013-07-20 NOTE — Progress Notes (Signed)
  Subjective:    Patient ID: Jimmy Rice, male    DOB: 16-Jul-1946, 67 y.o.   MRN: [id number]  HPI Patient here for followup hematuria Never had any history of gross hematuria. Trace blood on urine dipstick one month ago. He denies any dysuria. No fevers or chills. No flank pain. History of elevated PSA followed by urology with no history of prostate cancer. Has not complained of any bleeding or bruising complications.  Patient did have brother with bladder cancer. He quit smoking after only about 5 year history back in his teens and early 49s  Past Medical History  Diagnosis Date  . HYPOGONADISM 01/18/2009  . HYPERTENSION 01/18/2009  . GERD 01/18/2009  . BENIGN PROSTATIC HYPERTROPHY, HX OF 04/04/2009  . Allergy   . Asthma     OA  . Blood transfusion   . Cataract   . Chronic kidney disease     cysts on kidneys   Past Surgical History  Procedure Laterality Date  . Colonoscopy    . Nasal sinus surgery    . Inguinal hernia repair      reports that he has quit smoking. He has never used smokeless tobacco. He reports that he does not drink alcohol or use illicit drugs. family history includes Stomach cancer in his paternal aunt. There is no history of Colon cancer, Esophageal cancer, or Rectal cancer. Allergies  Allergen Reactions  . Flomax [Tamsulosin Hcl]     Difficulty breathing, nasal stuffiness      Review of Systems  Constitutional: Negative for fever, chills, appetite change and unexpected weight change.  Respiratory: Negative for cough.   Endocrine: Negative for polydipsia and polyuria.  Genitourinary: Negative for dysuria, frequency, hematuria, flank pain and difficulty urinating.       Objective:   Physical Exam  Constitutional: He appears well-developed and well-nourished.  Neck: Neck supple. No thyromegaly present.  Cardiovascular: Normal rate and regular rhythm.   Pulmonary/Chest: Effort normal and breath sounds normal. No respiratory distress. He has no  wheezes. He has no rales.          Assessment & Plan:  Hematuria on urine dipstick. Repeat urine today confirms. Send urine micro-.

## 2013-07-21 LAB — URINALYSIS, MICROSCOPIC ONLY
Bacteria, UA: NONE SEEN
Crystals: NONE SEEN

## 2013-08-13 ENCOUNTER — Other Ambulatory Visit: Payer: Self-pay | Admitting: Family Medicine

## 2013-10-20 ENCOUNTER — Ambulatory Visit (INDEPENDENT_AMBULATORY_CARE_PROVIDER_SITE_OTHER): Payer: Medicare HMO | Admitting: Family Medicine

## 2013-10-20 ENCOUNTER — Encounter: Payer: Self-pay | Admitting: Family Medicine

## 2013-10-20 ENCOUNTER — Ambulatory Visit (INDEPENDENT_AMBULATORY_CARE_PROVIDER_SITE_OTHER)
Admission: RE | Admit: 2013-10-20 | Discharge: 2013-10-20 | Disposition: A | Discharge status: Home / Self Care | Payer: Commercial Managed Care - HMO | Source: Ambulatory Visit | Attending: Family Medicine | Admitting: Family Medicine

## 2013-10-20 VITALS — BP 136/78 | HR 78 | Temp 97.6°F | Wt 209.0 lb

## 2013-10-20 DIAGNOSIS — R609 Edema, unspecified: Secondary | ICD-10-CM

## 2013-10-20 DIAGNOSIS — R0609 Other forms of dyspnea: Secondary | ICD-10-CM

## 2013-10-20 DIAGNOSIS — R0600 Dyspnea, unspecified: Secondary | ICD-10-CM

## 2013-10-20 DIAGNOSIS — R0989 Other specified symptoms and signs involving the circulatory and respiratory systems: Secondary | ICD-10-CM

## 2013-10-20 LAB — BRAIN NATRIURETIC PEPTIDE: PRO B NATRI PEPTIDE: 15 pg/mL (ref 0.0–100.0)

## 2013-10-20 LAB — BASIC METABOLIC PANEL
BUN: 19 mg/dL (ref 6–23)
CALCIUM: 9.4 mg/dL (ref 8.4–10.5)
CO2: 28 mEq/L (ref 19–32)
CREATININE: 1.6 mg/dL — AB (ref 0.4–1.5)
Chloride: 104 mEq/L (ref 96–112)
GFR: 54.76 mL/min — AB (ref >60.00)
GLUCOSE: 73 mg/dL (ref 70–99)
POTASSIUM: 3.7 meq/L (ref 3.5–5.1)
Sodium: 138 mEq/L (ref 135–145)

## 2013-10-20 LAB — TSH: TSH: 0.54 u[IU]/mL (ref 0.35–5.50)

## 2013-10-20 IMAGING — CR DG CHEST 2V
2 series · 2 of 2 positions shown · non-contrast
Comparison: [DATE].

CLINICAL DATA: Cough and shortness of breath.

EXAM:
CHEST  2 VIEW

[view not recorded (1 of 2)]
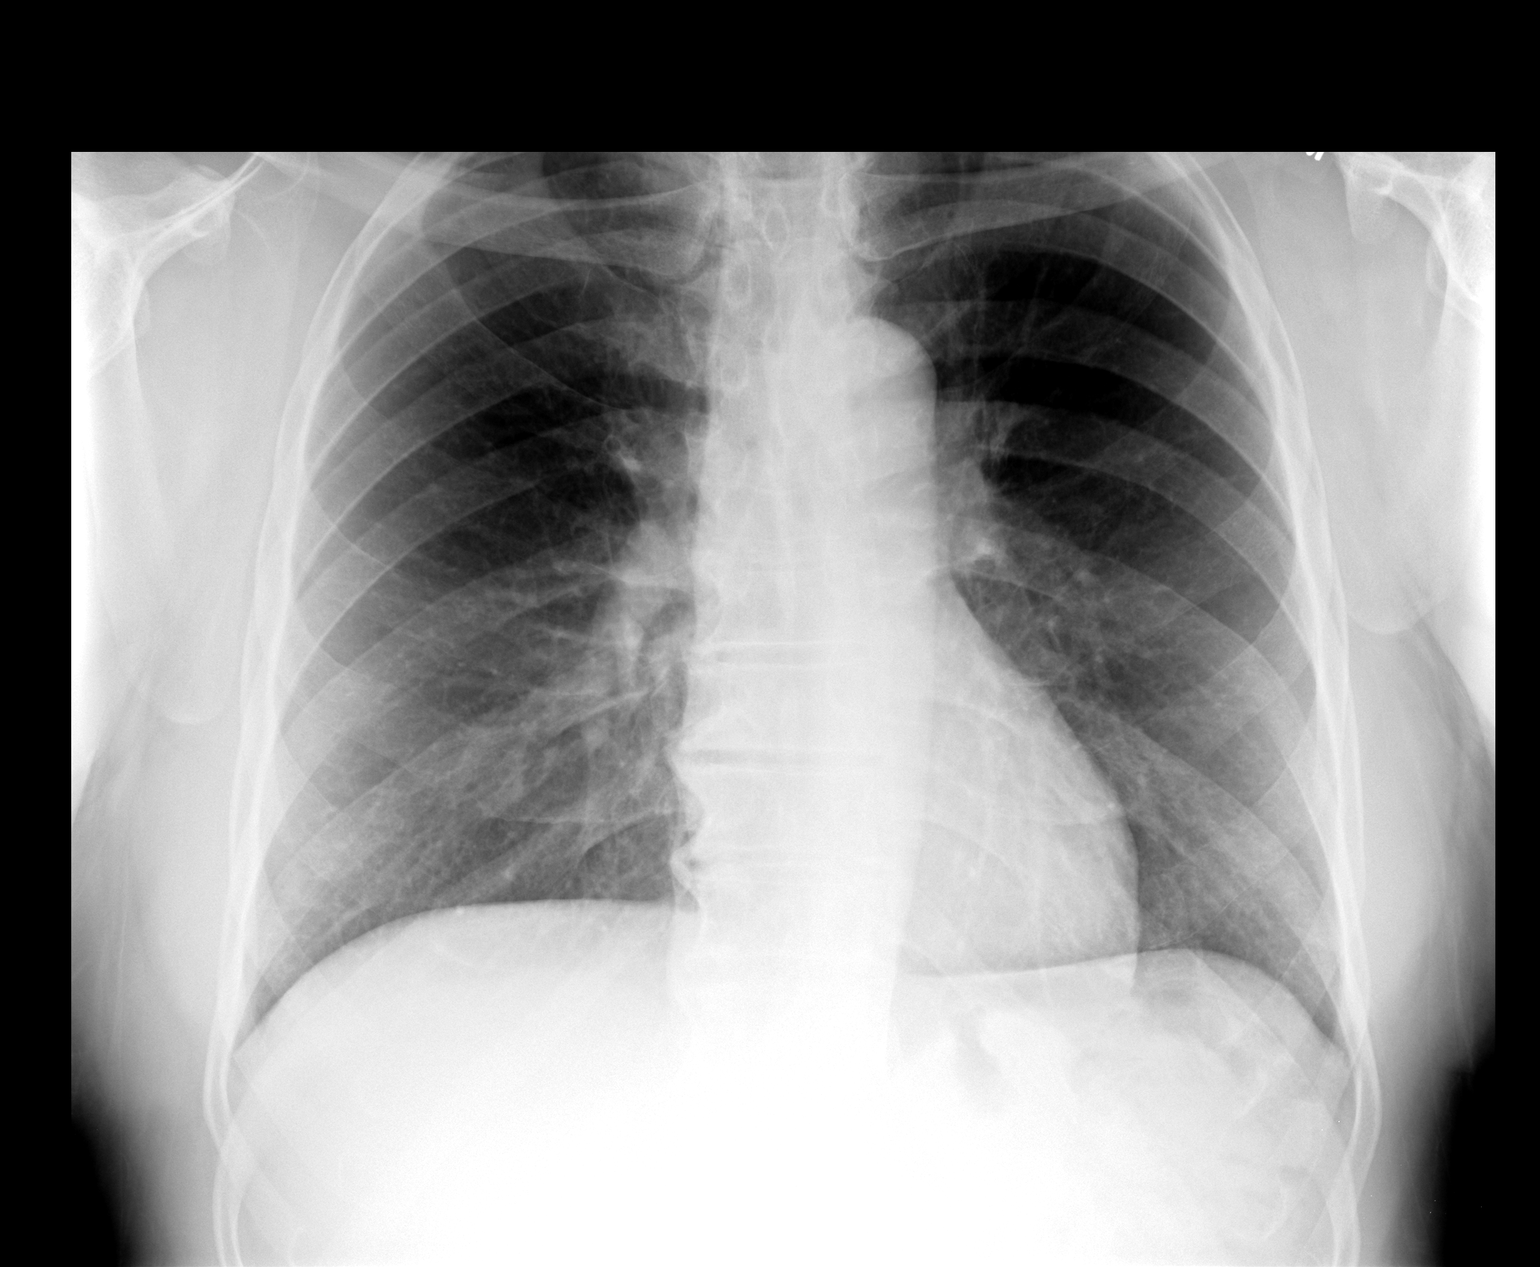

[view not recorded (2 of 2)]
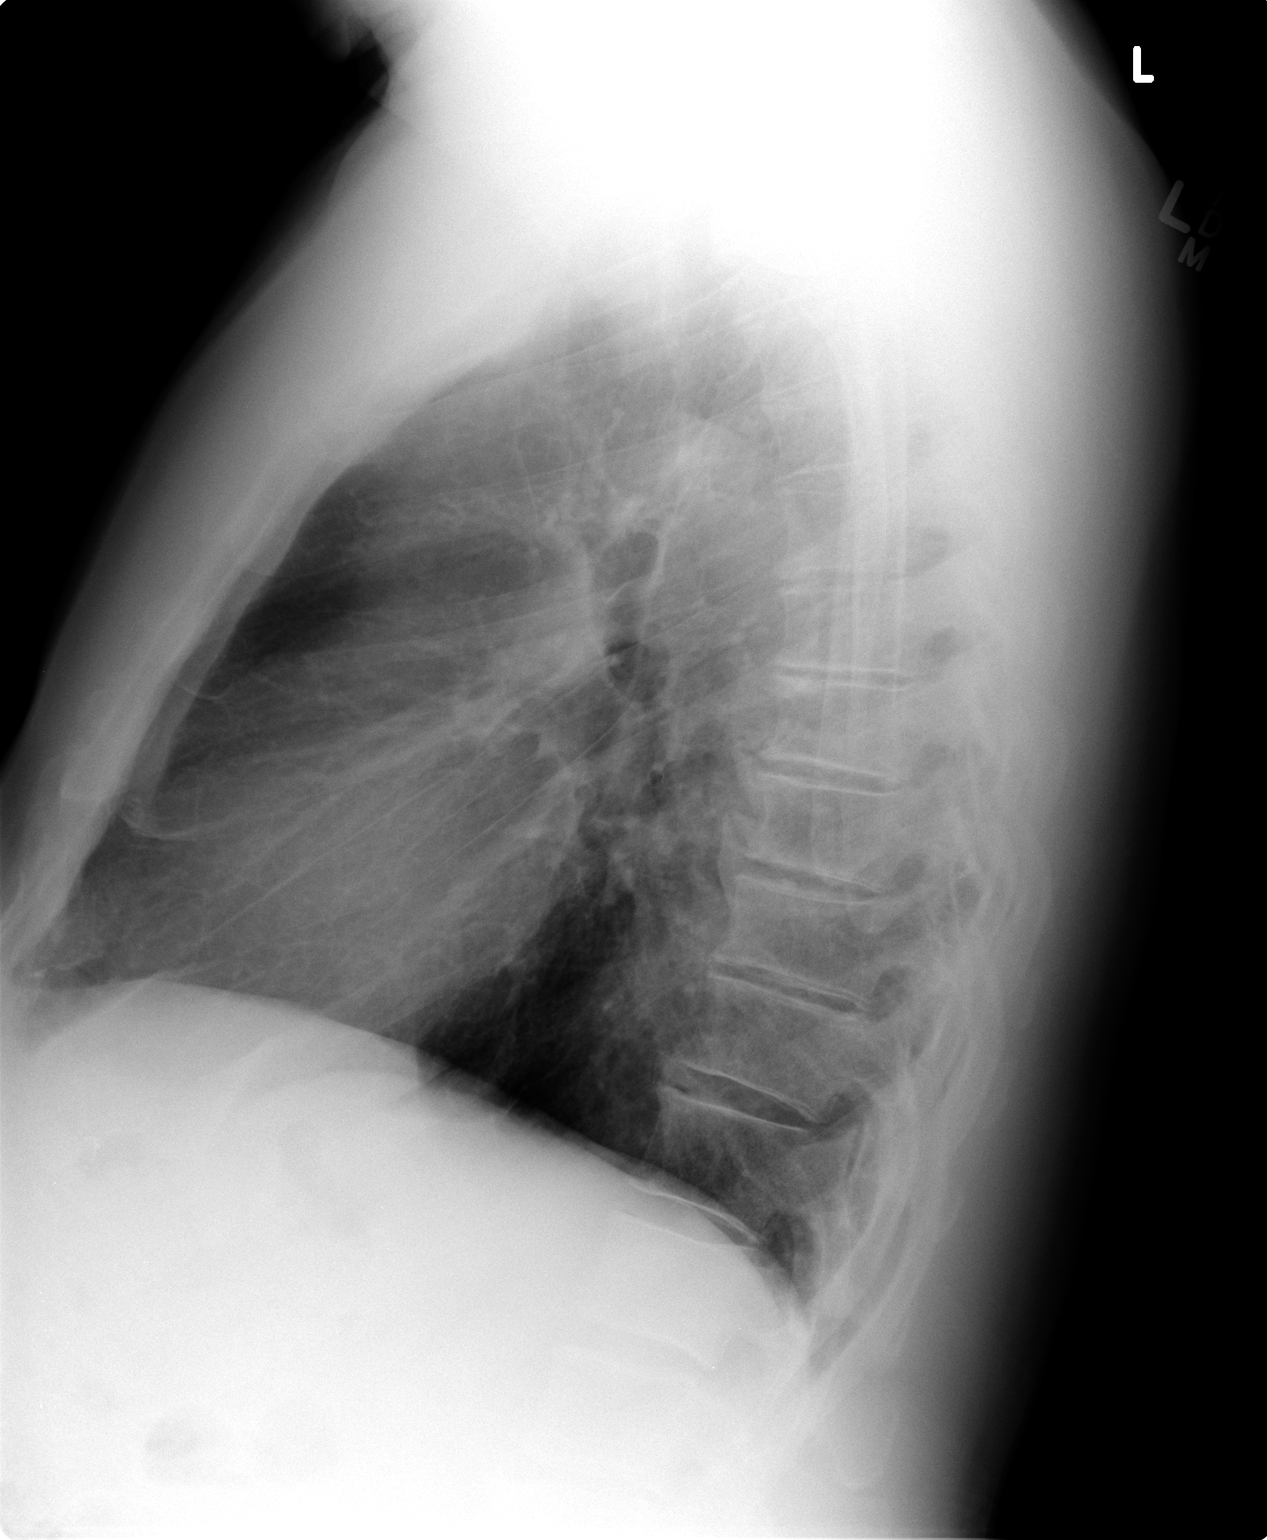

[2 of 2 positions shown; findings below may reference images not displayed]

FINDINGS: Mediastinum and hilar structures are normal. The lungs are clear.
Heart size normal. Degenerative changes thoracic spine.
IMPRESSION: No active cardiopulmonary disease.

## 2013-10-20 NOTE — Patient Instructions (Signed)
Elevate legs for any edema Avoid > 3 gms sodium per day.

## 2013-10-20 NOTE — Progress Notes (Signed)
Pre visit review using our clinic review tool, if applicable. No additional management support is needed unless otherwise documented below in the visit note. 

## 2013-10-20 NOTE — Progress Notes (Signed)
   Subjective:    Patient ID: Jimmy Rice, male    DOB: 15-Jun-1946, 68 y.o.   MRN: [id number]  HPI Patient here with complaints of bilateral intermittent feet swelling off and on for several months but especially past couple months. Also developing occasional shortness of breath at night which seems to be positional. No paroxysmal nocturnal dyspnea. Denies any associated cough or wheezing. He has some gradual weight gain has been poorly compliant with exercise. He's never had any chest pain with exercise. No history of heart failure. Currently Has had some edema off and on. Recent labs included normal albumin. His recent creatinine is 1.5 which is near his baseline.  He doesn't take any nonsteroidals. His blood pressures been controlled fairly well with losartan and amlodipine. He also complains of occasional hot flashes. He has history of hypogonadism currently not under treatment. Recent TSH normal. No headaches. No diarrhea or abdominal pain.  Past Medical History  Diagnosis Date  . HYPOGONADISM 01/18/2009  . HYPERTENSION 01/18/2009  . GERD 01/18/2009  . BENIGN PROSTATIC HYPERTROPHY, HX OF 04/04/2009  . Allergy   . Asthma     OA  . Blood transfusion   . Cataract   . Chronic kidney disease     cysts on kidneys   Past Surgical History  Procedure Laterality Date  . Colonoscopy    . Nasal sinus surgery    . Inguinal hernia repair      reports that he has quit smoking. He has never used smokeless tobacco. He reports that he does not drink alcohol or use illicit drugs. family history includes Stomach cancer in his paternal aunt. There is no history of Colon cancer, Esophageal cancer, or Rectal cancer. Allergies  Allergen Reactions  . Flomax [Tamsulosin Hcl]     Difficulty breathing, nasal stuffiness      Review of Systems  Constitutional: Positive for unexpected weight change. Negative for fever, chills and fatigue.  Respiratory: Positive for shortness of breath. Negative for  cough and wheezing.   Cardiovascular: Positive for leg swelling. Negative for chest pain and palpitations.  Gastrointestinal: Negative for nausea, vomiting, abdominal pain and diarrhea.  Skin: Negative for rash.  Neurological: Negative for dizziness and syncope.       Objective:   Physical Exam  Constitutional: He is oriented to person, place, and time. He appears well-developed and well-nourished.  Neck: Neck supple. No JVD present. No thyromegaly present.  Cardiovascular: Normal rate and regular rhythm.  Exam reveals no gallop.   Pulmonary/Chest: Effort normal and breath sounds normal. No respiratory distress. He has no wheezes. He has no rales.  Musculoskeletal: He exhibits no edema.  Lymphadenopathy:    He has no cervical adenopathy.  Neurological: He is alert and oriented to person, place, and time. No cranial nerve deficit.          Assessment & Plan:  Patient presents with intermittent dyspnea which sounds like orthopnea and reported intermitted edema. His weight is up 9 pounds from October though he has no evidence for edema today. He does describe intermittent edema. Recent albumin normal. He has some chronic kidney disease which has been stable. No recent change in medications. Obtain chest x-ray. Check basic metabolic panel and BNP level. Consider echocardiogram, especially if BNP elevated, though he has no findings today on exam to suggest any overt heart failure.

## 2013-11-02 ENCOUNTER — Telehealth: Payer: Self-pay | Admitting: Family Medicine

## 2013-11-02 MED ORDER — LOSARTAN POTASSIUM 50 MG PO TABS: 50.0000 mg | ORAL_TABLET | Freq: Every day | ORAL | Status: DC

## 2013-11-02 MED ORDER — AMLODIPINE BESYLATE 5 MG PO TABS: ORAL_TABLET | ORAL | Status: DC

## 2013-11-02 NOTE — Telephone Encounter (Signed)
[facility] requesting new scripts of the following:  amLODipine (NORVASC) 5 MG tablet losartan (COZAAR) 50 MG tablet

## 2013-11-02 NOTE — Telephone Encounter (Signed)
RXs sent to mail order

## 2013-12-15 ENCOUNTER — Ambulatory Visit (INDEPENDENT_AMBULATORY_CARE_PROVIDER_SITE_OTHER): Payer: Medicare HMO | Admitting: Family Medicine

## 2013-12-15 ENCOUNTER — Encounter: Payer: Self-pay | Admitting: Family Medicine

## 2013-12-15 VITALS — BP 130/80 | HR 87 | Wt 207.0 lb

## 2013-12-15 DIAGNOSIS — L509 Urticaria, unspecified: Secondary | ICD-10-CM

## 2013-12-15 DIAGNOSIS — I10 Essential (primary) hypertension: Secondary | ICD-10-CM

## 2013-12-15 NOTE — Progress Notes (Signed)
   Subjective:    Patient ID: Jimmy Rice, male    DOB: 17-Dec-1945, 68 y.o.   MRN: [id number]  HPI Patient has hypertension treated with losartan and amlodipine. Had some recent intermittent hives mostly buttock and posterior arm region-bilateral. No clear triggers. He was concerned this may be due to medication but his symptoms are very inconsistent. He is taking his blood pressure medications consistently. Last visit he had some dyspnea and mild edema with lab work and unremarkable.   Those symptoms have improved. Patient had recent prostate biopsy for elevated PSA and apparently no malignancy noted  Past Medical History  Diagnosis Date  . HYPOGONADISM 01/18/2009  . HYPERTENSION 01/18/2009  . GERD 01/18/2009  . BENIGN PROSTATIC HYPERTROPHY, HX OF 04/04/2009  . Allergy   . Asthma     OA  . Blood transfusion   . Cataract   . Chronic kidney disease     cysts on kidneys   Past Surgical History  Procedure Laterality Date  . Colonoscopy    . Nasal sinus surgery    . Inguinal hernia repair      reports that he has quit smoking. He has never used smokeless tobacco. He reports that he does not drink alcohol or use illicit drugs. family history includes Stomach cancer in his paternal aunt. There is no history of Colon cancer, Esophageal cancer, or Rectal cancer. Allergies  Allergen Reactions  . Flomax [Tamsulosin Hcl]     Difficulty breathing, nasal stuffiness      Review of Systems  Constitutional: Negative for fever and appetite change.  Respiratory: Negative for cough and shortness of breath.   Cardiovascular: Negative for chest pain and leg swelling.  Skin: Positive for rash.       Objective:   Physical Exam  Constitutional: He appears well-developed and well-nourished.  Cardiovascular: Normal rate.   Pulmonary/Chest: Effort normal and breath sounds normal. No respiratory distress. He has no wheezes. He has no rales.  Skin: No rash noted.          Assessment &  Plan:  #1 hypertension. Stable. Continue current medications #2 intermittent hives. We explained these or not likely related his antihypertensives since they're very localized and also infrequent. We have suggested H2 blocker such as Pepcid or Zantac in combination with H1 blocker such as Allegra or Zyrtec.

## 2013-12-15 NOTE — Progress Notes (Signed)
Pre visit review using our clinic review tool, if applicable. No additional management support is needed unless otherwise documented below in the visit note. 

## 2013-12-15 NOTE — Patient Instructions (Signed)

## 2013-12-16 ENCOUNTER — Telehealth: Payer: Self-pay | Admitting: Family Medicine

## 2013-12-16 NOTE — Telephone Encounter (Signed)
Relevant patient education mailed to patient.  

## 2013-12-20 ENCOUNTER — Ambulatory Visit (INDEPENDENT_AMBULATORY_CARE_PROVIDER_SITE_OTHER): Payer: Medicare HMO | Admitting: Internal Medicine

## 2013-12-20 ENCOUNTER — Encounter: Payer: Self-pay | Admitting: Internal Medicine

## 2013-12-20 VITALS — BP 136/82 | HR 94 | Ht 71.0 in | Wt 202.0 lb

## 2013-12-20 DIAGNOSIS — K5900 Constipation, unspecified: Secondary | ICD-10-CM | POA: Insufficient documentation

## 2013-12-20 NOTE — Assessment & Plan Note (Signed)
Cause not clear Occurred after prostate bx Will use a Miralax purge and then MiraLax prn Recent colonoscopy 2013 ok

## 2013-12-20 NOTE — Progress Notes (Signed)
         Subjective:    Patient ID: Jimmy Rice, male    DOB: Mar 06, 1946, 68 y.o.   MRN: [id number]  HPI The patient is here because of new constipation. No good bowel movement in 7 days or so - had prostate bx 10 days ago and last BM was day before that. Normally 2-3 stools/day w/o difficulty.  No abdominal pain. Medications, allergies, past medical history, past surgical history, family history and social history are reviewed and updated in the EMR.  Review of Systems Hematospermia BPH w/ urinary frequency    Objective:   Physical Exam WDWN NAD Abdomen is soft and non-tender BS+ and no hernia Rectal - LL position  Circumferential skin tags, nomal resting tone to slightly increased, no mass, soft brown stool Prostate not examined       Assessment & Plan:  Acute constipation Cause not clear Occurred after prostate bx Will use a Miralax purge and then MiraLax prn Recent colonoscopy 2013 ok

## 2013-12-20 NOTE — Patient Instructions (Signed)
Today we want you to do a bowel purge , then use Miralax as needed.  If you continue to have problems call us back.   Dr Carlean Purl recommends that you complete a bowel purge (to clean out your bowels). Please do the following: Purchase a bottle of Miralax over the counter as well as a box of 5 mg dulcolax tablets. Take 4 dulcolax tablets. Wait 1 hour. You will then drink 6-8 capfuls of Miralax mixed in an adequate amount of water/juice/gatorade (you may choose which of these liquids to drink) over the next 2-3 hours. You should expect results within 1 to 6 hours after completing the bowel purge.   I appreciate the opportunity to care for you.

## 2014-01-11 DIAGNOSIS — R351 Nocturia: Secondary | ICD-10-CM | POA: Insufficient documentation

## 2014-01-11 DIAGNOSIS — R350 Frequency of micturition: Secondary | ICD-10-CM | POA: Insufficient documentation

## 2014-02-20 ENCOUNTER — Ambulatory Visit (INDEPENDENT_AMBULATORY_CARE_PROVIDER_SITE_OTHER): Payer: Commercial Managed Care - HMO | Admitting: Family Medicine

## 2014-03-01 ENCOUNTER — Ambulatory Visit (INDEPENDENT_AMBULATORY_CARE_PROVIDER_SITE_OTHER): Payer: Commercial Managed Care - HMO | Admitting: Family Medicine

## 2014-03-01 ENCOUNTER — Encounter: Payer: Self-pay | Admitting: Family Medicine

## 2014-03-01 ENCOUNTER — Other Ambulatory Visit: Payer: Self-pay | Admitting: Family Medicine

## 2014-03-01 VITALS — BP 132/78 | HR 90 | Temp 98.6°F | Wt 206.0 lb

## 2014-03-01 DIAGNOSIS — I10 Essential (primary) hypertension: Secondary | ICD-10-CM

## 2014-03-01 DIAGNOSIS — H269 Unspecified cataract: Secondary | ICD-10-CM

## 2014-03-01 MED ORDER — LOSARTAN POTASSIUM-HCTZ 100-12.5 MG PO TABS: 1.0000 | ORAL_TABLET | Freq: Every day | ORAL | Status: DC

## 2014-03-01 NOTE — Progress Notes (Signed)
   Subjective:    Patient ID: Jimmy Rice, male    DOB: 04-Jun-1946, 68 y.o.   MRN: [id number]  HPI Patient has long-standing history of hypertension. He currently takes amlodipine 5 mg daily and losartan 50 mg daily. He's had recurrent hives recently and has noticed itching immediately after taking amlodipine and thinks this is the culprit. He would like to consider change in medications.  not aware of any other triggers. His blood pressure has been well controlled.  Past Medical History  Diagnosis Date  . HYPOGONADISM 01/18/2009  . HYPERTENSION 01/18/2009  . GERD 01/18/2009  . BENIGN PROSTATIC HYPERTROPHY, HX OF 04/04/2009  . Allergy   . Asthma     OA  . Blood transfusion   . Cataract   . Chronic kidney disease     cysts on kidneys   Past Surgical History  Procedure Laterality Date  . Colonoscopy    . Nasal sinus surgery    . Inguinal hernia repair      reports that he has quit smoking. He has never used smokeless tobacco. He reports that he does not drink alcohol or use illicit drugs. family history includes Stomach cancer in his paternal aunt. There is no history of Colon cancer, Esophageal cancer, or Rectal cancer. Allergies  Allergen Reactions  . Flomax [Tamsulosin Hcl]     Difficulty breathing, nasal stuffiness      Review of Systems  Constitutional: Negative for fatigue.  Eyes: Negative for visual disturbance.  Respiratory: Negative for cough, chest tightness and shortness of breath.   Cardiovascular: Negative for chest pain, palpitations and leg swelling.  Neurological: Negative for dizziness, syncope, weakness, light-headedness and headaches.       Objective:   Physical Exam  Constitutional: He is oriented to person, place, and time. He appears well-developed and well-nourished.  HENT:  Right Ear: External ear normal.  Left Ear: External ear normal.  Mouth/Throat: Oropharynx is clear and moist.  Eyes: Pupils are equal, round, and reactive to light.  Neck:  Neck supple. No thyromegaly present.  Cardiovascular: Normal rate and regular rhythm.   Pulmonary/Chest: Effort normal and breath sounds normal. No respiratory distress. He has no wheezes. He has no rales.  Musculoskeletal: He exhibits no edema.  Neurological: He is alert and oriented to person, place, and time.  Skin: No rash noted.          Assessment & Plan:  Hypertension. Well controlled. Possible adverse side effect with amlodipine. Discontinue amlodipine. Change losartan to losartan HCTZ 100/12.5 mg 1 daily. Reassess blood pressure 3 weeks

## 2014-03-01 NOTE — Progress Notes (Signed)
Pre visit review using our clinic review tool, if applicable. No additional management support is needed unless otherwise documented below in the visit note. 

## 2014-03-08 ENCOUNTER — Telehealth: Payer: Self-pay | Admitting: Family Medicine

## 2014-03-08 MED ORDER — LOSARTAN POTASSIUM-HCTZ 100-12.5 MG PO TABS: ORAL_TABLET | ORAL | Status: DC

## 2014-03-08 NOTE — Telephone Encounter (Signed)
Rx sent to mail order

## 2014-03-08 NOTE — Telephone Encounter (Signed)
Pt needs losartan-hctz 100-12.5 mg #90 w.refills sent to [facility]

## 2014-03-22 ENCOUNTER — Ambulatory Visit: Payer: Commercial Managed Care - HMO | Admitting: Family Medicine

## 2014-03-22 DIAGNOSIS — Z0289 Encounter for other administrative examinations: Secondary | ICD-10-CM

## 2014-03-30 ENCOUNTER — Encounter: Payer: Self-pay | Admitting: Family Medicine

## 2014-03-30 ENCOUNTER — Ambulatory Visit (INDEPENDENT_AMBULATORY_CARE_PROVIDER_SITE_OTHER): Payer: Commercial Managed Care - HMO | Admitting: Family Medicine

## 2014-03-30 VITALS — BP 136/80 | HR 77 | Wt 206.0 lb

## 2014-03-30 DIAGNOSIS — I10 Essential (primary) hypertension: Secondary | ICD-10-CM

## 2014-03-30 NOTE — Patient Instructions (Signed)
Try to get back to more consistent aerobic exercise Monitor blood pressure at home- goal < 130/80.

## 2014-03-30 NOTE — Progress Notes (Signed)
Pre visit review using our clinic review tool, if applicable. No additional management support is needed unless otherwise documented below in the visit note. 

## 2014-03-30 NOTE — Progress Notes (Signed)
   Subjective:    Patient ID: Jimmy Rice, male    DOB: [birth year], 68 y.o.   MRN: [id number]  HPI Followup hypertension. Patient developed hives on amlodipine. Change his losartan to losartan HCTZ. He is tolerated well. Not monitoring blood pressure at home. No headaches. No dizziness. Compliant with therapy. He has been somewhat lax in exercise but plans to start back soon.  Past Medical History  Diagnosis Date  . HYPOGONADISM 01/18/2009  . HYPERTENSION 01/18/2009  . GERD 01/18/2009  . BENIGN PROSTATIC HYPERTROPHY, HX OF 04/04/2009  . Allergy   . Asthma     OA  . Blood transfusion   . Cataract   . Chronic kidney disease     cysts on kidneys   Past Surgical History  Procedure Laterality Date  . Colonoscopy    . Nasal sinus surgery    . Inguinal hernia repair      reports that he has quit smoking. He has never used smokeless tobacco. He reports that he does not drink alcohol or use illicit drugs. family history includes Stomach cancer in his paternal aunt. There is no history of Colon cancer, Esophageal cancer, or Rectal cancer. Allergies  Allergen Reactions  . Flomax [Tamsulosin Hcl]     Difficulty breathing, nasal stuffiness      Review of Systems  Constitutional: Negative for fatigue.  Eyes: Negative for visual disturbance.  Respiratory: Negative for cough, chest tightness and shortness of breath.   Cardiovascular: Negative for chest pain, palpitations and leg swelling.  Neurological: Negative for dizziness, syncope, weakness, light-headedness and headaches.       Objective:   Physical Exam  Constitutional: He is oriented to person, place, and time. He appears well-developed and well-nourished.  HENT:  Right Ear: External ear normal.  Left Ear: External ear normal.  Mouth/Throat: Oropharynx is clear and moist.  Eyes: Pupils are equal, round, and reactive to light.  Neck: Neck supple. No thyromegaly present.  Cardiovascular: Normal rate and regular rhythm.     Pulmonary/Chest: Effort normal and breath sounds normal. No respiratory distress. He has no wheezes. He has no rales.  Musculoskeletal: He exhibits no edema.  Neurological: He is alert and oriented to person, place, and time.          Assessment & Plan:  Hypertension. Repeat reading left arm seated 140/86. With history of chronic kidney disease we would prefer to see this below 130 /80. We discussed options. He prefers losing some weight and establish more consistent exercise and reassess 3 months versus additional medication. Not improve further that time we'll add additional medication.

## 2014-08-14 ENCOUNTER — Ambulatory Visit (INDEPENDENT_AMBULATORY_CARE_PROVIDER_SITE_OTHER): Payer: Commercial Managed Care - HMO | Admitting: Family Medicine

## 2014-08-14 ENCOUNTER — Encounter: Payer: Self-pay | Admitting: Family Medicine

## 2014-08-14 VITALS — BP 136/90 | HR 82 | Temp 97.4°F | Ht 71.0 in | Wt 206.0 lb

## 2014-08-14 DIAGNOSIS — N400 Enlarged prostate without lower urinary tract symptoms: Secondary | ICD-10-CM

## 2014-08-14 DIAGNOSIS — I10 Essential (primary) hypertension: Secondary | ICD-10-CM

## 2014-08-14 DIAGNOSIS — Z0000 Encounter for general adult medical examination without abnormal findings: Secondary | ICD-10-CM

## 2014-08-14 DIAGNOSIS — N189 Chronic kidney disease, unspecified: Secondary | ICD-10-CM

## 2014-08-14 DIAGNOSIS — Z23 Encounter for immunization: Secondary | ICD-10-CM

## 2014-08-14 LAB — HEPATIC FUNCTION PANEL
ALT: 26 U/L (ref 0–53)
AST: 24 U/L (ref 0–37)
Albumin: 3.5 g/dL (ref 3.5–5.2)
Alkaline Phosphatase: 59 U/L (ref 39–117)
BILIRUBIN DIRECT: 0 mg/dL (ref 0.0–0.3)
TOTAL PROTEIN: 7.4 g/dL (ref 6.0–8.3)
Total Bilirubin: 0.9 mg/dL (ref 0.2–1.2)

## 2014-08-14 LAB — CBC WITH DIFFERENTIAL/PLATELET
BASOS PCT: 0.6 % (ref 0.0–3.0)
Basophils Absolute: 0 10*3/uL (ref 0.0–0.1)
EOS ABS: 0.4 10*3/uL (ref 0.0–0.7)
Eosinophils Relative: 10.2 % — ABNORMAL HIGH (ref 0.0–5.0)
HCT: 44.1 % (ref 39.0–52.0)
HEMOGLOBIN: 13.9 g/dL (ref 13.0–17.0)
LYMPHS PCT: 34.2 % (ref 12.0–46.0)
Lymphs Abs: 1.4 10*3/uL (ref 0.7–4.0)
MCHC: 31.5 g/dL (ref 30.0–36.0)
MCV: 91.8 fl (ref 78.0–100.0)
Monocytes Absolute: 0.5 10*3/uL (ref 0.1–1.0)
Monocytes Relative: 10.9 % (ref 3.0–12.0)
NEUTROS ABS: 1.8 10*3/uL (ref 1.4–7.7)
Neutrophils Relative %: 44.1 % (ref 43.0–77.0)
Platelets: 170 10*3/uL (ref 150.0–400.0)
RBC: 4.8 Mil/uL (ref 4.22–5.81)
RDW: 13.4 % (ref 11.5–15.5)
WBC: 4.1 10*3/uL (ref 4.0–10.5)

## 2014-08-14 LAB — BASIC METABOLIC PANEL
BUN: 21 mg/dL (ref 6–23)
CO2: 28 mEq/L (ref 19–32)
Calcium: 9.3 mg/dL (ref 8.4–10.5)
Chloride: 101 mEq/L (ref 96–112)
Creatinine, Ser: 1.6 mg/dL — ABNORMAL HIGH (ref 0.4–1.5)
GFR: 54.25 mL/min — ABNORMAL LOW (ref >60.00)
Glucose, Bld: 113 mg/dL — ABNORMAL HIGH (ref 70–99)
Potassium: 3.4 mEq/L — ABNORMAL LOW (ref 3.5–5.1)
Sodium: 138 mEq/L (ref 135–145)

## 2014-08-14 LAB — LIPID PANEL
CHOLESTEROL: 215 mg/dL — AB (ref 0–200)
HDL: 39.2 mg/dL (ref >39.00)
LDL Cholesterol: 141 mg/dL — ABNORMAL HIGH (ref 0–99)
NonHDL: 175.8
TRIGLYCERIDES: 176 mg/dL — AB (ref 0.0–149.0)
Total CHOL/HDL Ratio: 5
VLDL: 35.2 mg/dL (ref 0.0–40.0)

## 2014-08-14 LAB — TSH: TSH: 2.03 u[IU]/mL (ref 0.35–4.50)

## 2014-08-14 NOTE — Progress Notes (Signed)
Pre visit review using our clinic review tool, if applicable. No additional management support is needed unless otherwise documented below in the visit note. 

## 2014-08-14 NOTE — Progress Notes (Signed)
Subjective:    Patient ID: Jimmy Rice, male    DOB: 11-Jun-1946, 68 y.o.   MRN: [id number]  HPI Patient seen for complete physical. He has chronic problems of hypertension, chronic kidney disease, BPH, GERD, low testosterone. Followed through the [facility] and had recent urodynamics. He has chronic elevated PSA with negative biopsies. He is currently taking Flomax and Proscar but is still had obstructive symptoms and nocturia. He has scheduled cystoscopy and they are considering TURP procedure for his prostate. He has some fatigue issues which he attributes to not sleeping well at night with interruption because of frequent urination.  Needs Prevnar 13. Flu vaccine already given through the [place]. Blood pressures consistently less than 130/90 by home readings. No recent dizziness. No chest pains  Past Medical History  Diagnosis Date  . HYPOGONADISM 01/18/2009  . HYPERTENSION 01/18/2009  . GERD 01/18/2009  . BENIGN PROSTATIC HYPERTROPHY, HX OF 04/04/2009  . Allergy   . Asthma     OA  . Blood transfusion   . Cataract   . Chronic kidney disease     cysts on kidneys   Past Surgical History  Procedure Laterality Date  . Colonoscopy    . Nasal sinus surgery    . Inguinal hernia repair      reports that he has quit smoking. He has never used smokeless tobacco. He reports that he does not drink alcohol or use illicit drugs. family history includes Stomach cancer in his paternal aunt. There is no history of Colon cancer, Esophageal cancer, or Rectal cancer. Allergies  Allergen Reactions  . Amlodipine Hives  . Flomax [Tamsulosin Hcl]     Difficulty breathing, nasal stuffiness      Review of Systems  Constitutional: Negative for fever, activity change, appetite change and fatigue.  HENT: Negative for congestion, ear pain and trouble swallowing.   Eyes: Negative for pain and visual disturbance.  Respiratory: Negative for cough, shortness of breath and wheezing.   Cardiovascular:  Negative for chest pain and palpitations.  Gastrointestinal: Negative for nausea, vomiting, abdominal pain, diarrhea, constipation, blood in stool, abdominal distention and rectal pain.  Genitourinary: Positive for decreased urine volume. Negative for dysuria, hematuria and testicular pain.  Musculoskeletal: Negative for joint swelling and arthralgias.  Skin: Negative for rash.  Neurological: Negative for dizziness, syncope and headaches.  Hematological: Negative for adenopathy.  Psychiatric/Behavioral: Negative for confusion and dysphoric mood.       Objective:   Physical Exam  Constitutional: He is oriented to person, place, and time. He appears well-developed and well-nourished. No distress.  HENT:  Head: Normocephalic and atraumatic.  Right Ear: External ear normal.  Left Ear: External ear normal.  Mouth/Throat: Oropharynx is clear and moist.  Eyes: Conjunctivae and EOM are normal. Pupils are equal, round, and reactive to light.  Neck: Normal range of motion. Neck supple. No thyromegaly present.  Cardiovascular: Normal rate, regular rhythm and normal heart sounds.   No murmur heard. Pulmonary/Chest: No respiratory distress. He has no wheezes. He has no rales.  Abdominal: Soft. Bowel sounds are normal. He exhibits no distension and no mass. There is no tenderness. There is no rebound and no guarding.  Genitourinary:  Deferred prostate exam and rectal exam as he recently saw urologist through the [facility]  Musculoskeletal: He exhibits no edema.  Lymphadenopathy:    He has no cervical adenopathy.  Neurological: He is alert and oriented to person, place, and time. He displays normal reflexes. No cranial nerve  deficit.  Skin: No rash noted.  Psychiatric: He has a normal mood and affect.          Assessment & Plan:  Health maintenance. Flu vaccine already given. Tetanus up-to-date. Shingles already given. Prevnar 13 given today. Obtain screening lab work. Continue  follow-up through [place] regarding his BPH.

## 2014-08-15 ENCOUNTER — Telehealth: Payer: Self-pay | Admitting: Family Medicine

## 2014-08-16 NOTE — Telephone Encounter (Signed)
Error/njr °

## 2014-10-26 ENCOUNTER — Encounter: Payer: Self-pay | Admitting: Family Medicine

## 2014-10-26 ENCOUNTER — Ambulatory Visit (INDEPENDENT_AMBULATORY_CARE_PROVIDER_SITE_OTHER): Payer: Commercial Managed Care - HMO | Admitting: Family Medicine

## 2014-10-26 VITALS — BP 132/82 | HR 98 | Temp 97.7°F | Wt 208.0 lb

## 2014-10-26 DIAGNOSIS — R059 Cough, unspecified: Secondary | ICD-10-CM

## 2014-10-26 DIAGNOSIS — R0982 Postnasal drip: Secondary | ICD-10-CM

## 2014-10-26 DIAGNOSIS — R05 Cough: Secondary | ICD-10-CM

## 2014-10-26 DIAGNOSIS — K219 Gastro-esophageal reflux disease without esophagitis: Secondary | ICD-10-CM

## 2014-10-26 NOTE — Progress Notes (Signed)
   Subjective:    Patient ID: Jimmy Rice, male    DOB: [birth year], 69 y.o.   MRN: [id number]  HPI Patient seen with persistent cough for about 6 weeks now. Onset sometime back in December. He was up visiting in [place] and went to urgent care sometime around December 20. He was prescribed several medications including Tessalon, Claritin, Flonase, albuterol. He has cough which is mostly dry and occasionally productive of clear sputum. Intermittent hoarseness. Chest x-ray reportedly normal. Does have some occasional dyspnea. He feels he may be wheezing occasionally. He has frequent GERD symptoms. Took some Nexium and only briefly and inconsistently. Also has some postnasal drip symptoms. No appetite or weight changes. Nonsmoker. He does use Flonase occasionally but not on a regular basis. He finds himself frequently clearing his throat.  Takes losartan HCTZ for hypertension. No ACE inhibitor use. ? history of asthma.  Past Medical History  Diagnosis Date  . HYPOGONADISM 01/18/2009  . HYPERTENSION 01/18/2009  . GERD 01/18/2009  . BENIGN PROSTATIC HYPERTROPHY, HX OF 04/04/2009  . Allergy   . Asthma     OA  . Blood transfusion   . Cataract   . Chronic kidney disease     cysts on kidneys   Past Surgical History  Procedure Laterality Date  . Colonoscopy    . Nasal sinus surgery    . Inguinal hernia repair      reports that he has quit smoking. He has never used smokeless tobacco. He reports that he does not drink alcohol or use illicit drugs. family history includes Stomach cancer in his paternal aunt. There is no history of Colon cancer, Esophageal cancer, or Rectal cancer. Allergies  Allergen Reactions  . Amlodipine Hives  . Flomax [Tamsulosin Hcl]     Difficulty breathing, nasal stuffiness      Review of Systems  Constitutional: Positive for fatigue. Negative for fever, chills and unexpected weight change.  HENT: Positive for congestion and postnasal drip. Negative for sore  throat, tinnitus and trouble swallowing.   Respiratory: Positive for cough and shortness of breath. Negative for wheezing.   Cardiovascular: Negative for chest pain, palpitations and leg swelling.       Objective:   Physical Exam  Constitutional: He appears well-developed and well-nourished. No distress.  HENT:  Right Ear: External ear normal.  Left Ear: External ear normal.  Mouth/Throat: Oropharynx is clear and moist.  Neck: Neck supple. No thyromegaly present.  Cardiovascular: Normal rate and regular rhythm.   Pulmonary/Chest: Effort normal and breath sounds normal. No respiratory distress. He has no wheezes. He has no rales.  Musculoskeletal: He exhibits no edema.  Lymphadenopathy:    He has no cervical adenopathy.          Assessment & Plan:  Persistent cough. Suspect could be related to GERD and possibly also postnasal drip symptoms. We've recommended several things including dietary modification with handout given. Elevate head of bed 6-8 inches. Avoid mentholated products and also reduce caffeine use. Take Nexium daily and also Flonase daily. Try changing from Claritin to Allegra or Zyrtec for postnasal drip symptoms. Touch base if cough not improving over the next few weeks

## 2014-10-26 NOTE — Patient Instructions (Signed)
Gastroesophageal Reflux Disease, Adult Gastroesophageal reflux disease (GERD) happens when acid from your stomach flows up into the esophagus. When acid comes in contact with the esophagus, the acid causes soreness (inflammation) in the esophagus. Over time, GERD may create small holes (ulcers) in the lining of the esophagus. CAUSES   Increased body weight. This puts pressure on the stomach, making acid rise from the stomach into the esophagus.  Smoking. This increases acid production in the stomach.  Drinking alcohol. This causes decreased pressure in the lower esophageal sphincter (valve or ring of muscle between the esophagus and stomach), allowing acid from the stomach into the esophagus.  Late evening meals and a full stomach. This increases pressure and acid production in the stomach.  A malformed lower esophageal sphincter. Sometimes, no cause is found. SYMPTOMS   Burning pain in the lower part of the mid-chest behind the breastbone and in the mid-stomach area. This may occur twice a week or more often.  Trouble swallowing.  Sore throat.  Dry cough.  Asthma-like symptoms including chest tightness, shortness of breath, or wheezing. DIAGNOSIS  Your caregiver may be able to diagnose GERD based on your symptoms. In some cases, X-rays and other tests may be done to check for complications or to check the condition of your stomach and esophagus. TREATMENT  Your caregiver may recommend over-the-counter or prescription medicines to help decrease acid production. Ask your caregiver before starting or adding any new medicines.  HOME CARE INSTRUCTIONS   Change the factors that you can control. Ask your caregiver for guidance concerning weight loss, quitting smoking, and alcohol consumption.  Avoid foods and drinks that make your symptoms worse, such as:  Caffeine or alcoholic drinks.  Chocolate.  Peppermint or mint flavorings.  Garlic and onions.  Spicy foods.  Citrus fruits,  such as oranges, lemons, or limes.  Tomato-based foods such as sauce, chili, salsa, and pizza.  Fried and fatty foods.  Avoid lying down for the 3 hours prior to your bedtime or prior to taking a nap.  Eat small, frequent meals instead of large meals.  Wear loose-fitting clothing. Do not wear anything tight around your waist that causes pressure on your stomach.  Raise the head of your bed 6 to 8 inches with wood blocks to help you sleep. Extra pillows will not help.  Only take over-the-counter or prescription medicines for pain, discomfort, or fever as directed by your caregiver.  Do not take aspirin, ibuprofen, or other nonsteroidal anti-inflammatory drugs (NSAIDs). SEEK IMMEDIATE MEDICAL CARE IF:   You have pain in your arms, neck, jaw, teeth, or back.  Your pain increases or changes in intensity or duration.  You develop nausea, vomiting, or sweating (diaphoresis).  You develop shortness of breath, or you faint.  Your vomit is green, yellow, black, or looks like coffee grounds or blood.  Your stool is red, bloody, or black. These symptoms could be signs of other problems, such as heart disease, gastric bleeding, or esophageal bleeding. MAKE SURE YOU:   Understand these instructions.  Will watch your condition.  Will get help right away if you are not doing well or get worse. Document Released: 07/02/2005 Document Revised: 12/15/2011 Document Reviewed: 04/11/2011 [facility] Patient Information 2015 [place], [place]. This information is not intended to replace advice given to you by your health care provider. Make sure you discuss any questions you have with your health care provider. Food Choices for Gastroesophageal Reflux Disease When you have gastroesophageal reflux disease (GERD), the foods you  eat and your eating habits are very important. Choosing the right foods can help ease the discomfort of GERD. WHAT GENERAL GUIDELINES DO I NEED TO FOLLOW?  Choose fruits,  vegetables, whole grains, low-fat dairy products, and low-fat meat, fish, and poultry.  Limit fats such as oils, salad dressings, butter, nuts, and avocado.  Keep a food diary to identify foods that cause symptoms.  Avoid foods that cause reflux. These may be different for different people.  Eat frequent small meals instead of three large meals each day.  Eat your meals slowly, in a relaxed setting.  Limit fried foods.  Cook foods using methods other than frying.  Avoid drinking alcohol.  Avoid drinking large amounts of liquids with your meals.  Avoid bending over or lying down until 2-3 hours after eating. WHAT FOODS ARE NOT RECOMMENDED? The following are some foods and drinks that may worsen your symptoms: Vegetables Tomatoes. Tomato juice. Tomato and spaghetti sauce. Chili peppers. Onion and garlic. Horseradish. Fruits Oranges, grapefruit, and lemon (fruit and juice). Meats High-fat meats, fish, and poultry. This includes hot dogs, ribs, ham, sausage, salami, and bacon. Dairy Whole milk and chocolate milk. Sour cream. Cream. Butter. Ice cream. Cream cheese.  Beverages Coffee and tea, with or without caffeine. Carbonated beverages or energy drinks. Condiments Hot sauce. Barbecue sauce.  Sweets/Desserts Chocolate and cocoa. Donuts. Peppermint and spearmint. Fats and Oils High-fat foods, including [place] fries and potato chips. Other Vinegar. Strong spices, such as black pepper, white pepper, red pepper, cayenne, curry powder, cloves, ginger, and chili powder. The items listed above may not be a complete list of foods and beverages to avoid. Contact your dietitian for more information. Document Released: 09/22/2005 Document Revised: 09/27/2013 Document Reviewed: 07/27/2013 [facility] Patient Information 2015 [place], [place]. This information is not intended to replace advice given to you by your health care provider. Make sure you discuss any questions you have with your  health care provider.  Elevate head of bed 6 to 8 inches. Take the Nexium and Flonase DAILY Consider Allegra or Zyrtec in place of the [place].

## 2014-10-26 NOTE — Progress Notes (Signed)
Pre visit review using our clinic review tool, if applicable. No additional management support is needed unless otherwise documented below in the visit note. 

## 2014-12-01 ENCOUNTER — Ambulatory Visit (INDEPENDENT_AMBULATORY_CARE_PROVIDER_SITE_OTHER): Payer: Commercial Managed Care - HMO | Admitting: Family Medicine

## 2014-12-01 ENCOUNTER — Encounter: Payer: Self-pay | Admitting: Family Medicine

## 2014-12-01 VITALS — BP 142/80 | Temp 98.2°F | Wt 209.9 lb

## 2014-12-01 DIAGNOSIS — I10 Essential (primary) hypertension: Secondary | ICD-10-CM

## 2014-12-01 NOTE — Patient Instructions (Signed)
Insomnia Insomnia is frequent trouble falling and/or staying asleep. Insomnia can be a long term problem or a short term problem. Both are common. Insomnia can be a short term problem when the wakefulness is related to a certain stress or worry. Long term insomnia is often related to ongoing stress during waking hours and/or poor sleeping habits. Overtime, sleep deprivation itself can make the problem worse. Every little thing feels more severe because you are overtired and your ability to cope is decreased. CAUSES   Stress, anxiety, and depression.  Poor sleeping habits.  Distractions such as TV in the bedroom.  Naps close to bedtime.  Engaging in emotionally charged conversations before bed.  Technical reading before sleep.  Alcohol and other sedatives. They may make the problem worse. They can hurt normal sleep patterns and normal dream activity.  Stimulants such as caffeine for several hours prior to bedtime.  Pain syndromes and shortness of breath can cause insomnia.  Exercise late at night.  Changing time zones may cause sleeping problems (jet lag). It is sometimes helpful to have someone observe your sleeping patterns. They should look for periods of not breathing during the night (sleep apnea). They should also look to see how long those periods last. If you live alone or observers are uncertain, you can also be observed at a sleep clinic where your sleep patterns will be professionally monitored. Sleep apnea requires a checkup and treatment. Give your caregivers your medical history. Give your caregivers observations your family has made about your sleep.  SYMPTOMS   Not feeling rested in the morning.  Anxiety and restlessness at bedtime.  Difficulty falling and staying asleep. TREATMENT   Your caregiver may prescribe treatment for an underlying medical disorders. Your caregiver can give advice or help if you are using alcohol or other drugs for self-medication. Treatment  of underlying problems will usually eliminate insomnia problems.  Medications can be prescribed for short time use. They are generally not recommended for lengthy use.  Over-the-counter sleep medicines are not recommended for lengthy use. They can be habit forming.  You can promote easier sleeping by making lifestyle changes such as:  Using relaxation techniques that help with breathing and reduce muscle tension.  Exercising earlier in the day.  Changing your diet and the time of your last meal. No night time snacks.  Establish a regular time to go to bed.  Counseling can help with stressful problems and worry.  Soothing music and white noise may be helpful if there are background noises you cannot remove.  Stop tedious detailed work at least one hour before bedtime. HOME CARE INSTRUCTIONS   Keep a diary. Inform your caregiver about your progress. This includes any medication side effects. See your caregiver regularly. Take note of:  Times when you are asleep.  Times when you are awake during the night.  The quality of your sleep.  How you feel the next day. This information will help your caregiver care for you.  Get out of bed if you are still awake after 15 minutes. Read or do some quiet activity. Keep the lights down. Wait until you feel sleepy and go back to bed.  Keep regular sleeping and waking hours. Avoid naps.  Exercise regularly.  Avoid distractions at bedtime. Distractions include watching television or engaging in any intense or detailed activity like attempting to balance the household checkbook.  Develop a bedtime ritual. Keep a familiar routine of bathing, brushing your teeth, climbing into bed at the same   time each night, listening to soothing music. Routines increase the success of falling to sleep faster.  Use relaxation techniques. This can be using breathing and muscle tension release routines. It can also include visualizing peaceful scenes. You can  also help control troubling or intruding thoughts by keeping your mind occupied with boring or repetitive thoughts like the old concept of counting sheep. You can make it more creative like imagining planting one beautiful flower after another in your backyard garden.  During your day, work to eliminate stress. When this is not possible use some of the previous suggestions to help reduce the anxiety that accompanies stressful situations. MAKE SURE YOU:   Understand these instructions.  Will watch your condition.  Will get help right away if you are not doing well or get worse. Document Released: 09/19/2000 Document Revised: 12/15/2011 Document Reviewed: 10/20/2007 [employer] Patient Information 2015 [employer]. This information is not intended to replace advice given to you by your health care provider. Make sure you discuss any questions you have with your health care provider.  

## 2014-12-01 NOTE — Progress Notes (Signed)
   Subjective:    Patient ID: Jimmy Rice, male    DOB: 13-Aug-1946, 69 y.o.   MRN: [id number]  HPI Patient here with concern for possible elevated blood pressure. History of hypertension. Currently treated with losartan HCTZ. Compliant with therapy. Recent home reading 155/90. He's had some Increased fatigue issues. He thinks this may be multifactorial. He has history of hypogonadism. He has elevated PSA with negative biopsies for cancer. He is currently not on testosterone replacement. He's had poor sleep quality. Frequently falls asleep watching TV early in the evening and then has difficulty getting back to sleep. No regular alcohol use. Minimal caffeine use.He has not been exercising much over the past year and has had some gradual weight gain.  Past Medical History  Diagnosis Date  . HYPOGONADISM 01/18/2009  . HYPERTENSION 01/18/2009  . GERD 01/18/2009  . BENIGN PROSTATIC HYPERTROPHY, HX OF 04/04/2009  . Allergy   . Asthma     OA  . Blood transfusion   . Cataract   . Chronic kidney disease     cysts on kidneys   Past Surgical History  Procedure Laterality Date  . Colonoscopy    . Nasal sinus surgery    . Inguinal hernia repair      reports that he has quit smoking. He has never used smokeless tobacco. He reports that he does not drink alcohol or use illicit drugs. family history includes Stomach cancer in his paternal aunt. There is no history of Colon cancer, Esophageal cancer, or Rectal cancer. Allergies  Allergen Reactions  . Amlodipine Hives  . Flomax [Tamsulosin Hcl]     Difficulty breathing, nasal stuffiness      Review of Systems  Constitutional: Positive for fatigue. Negative for appetite change and unexpected weight change.  Eyes: Negative for visual disturbance.  Respiratory: Negative for cough, chest tightness and shortness of breath.   Cardiovascular: Negative for chest pain, palpitations and leg swelling.  Neurological: Negative for dizziness, syncope,  weakness, light-headedness and headaches.       Objective:   Physical Exam  Constitutional: He appears well-developed and well-nourished.  Neck: Neck supple. No thyromegaly present.  Cardiovascular: Normal rate and regular rhythm.   Pulmonary/Chest: Effort normal and breath sounds normal. No respiratory distress. He has no wheezes. He has no rales.  Musculoskeletal: He exhibits no edema.          Assessment & Plan:  Hypertension. Repeat reading left arm and right arm seated obtained exactly 118/80 for both. Reassurance. Establish more consistent aerobic exercise. Sleep hygiene discussed. Avoid early evening naps. Patient will discuss possible testosterone replacement pros and cons with neurology

## 2014-12-01 NOTE — Progress Notes (Signed)
Pre visit review using our clinic review tool, if applicable. No additional management support is needed unless otherwise documented below in the visit note. 

## 2014-12-05 HISTORY — PX: TRANSURETHRAL RESECTION OF PROSTATE: SHX73

## 2014-12-08 ENCOUNTER — Telehealth: Payer: Self-pay | Admitting: Family Medicine

## 2014-12-08 NOTE — Telephone Encounter (Signed)
Outpatient Authorization [id number]  12/28/2014 - 06/26/2015 Referral in suspended status ( pending approval )  Ivin Booty, MD [facility] [place], [place] [address] Phone: [phone] Fax: [phone]

## 2015-01-10 ENCOUNTER — Telehealth: Payer: Self-pay | Admitting: Family Medicine

## 2015-01-10 NOTE — Telephone Encounter (Signed)
Last visit 12/01/14 Is it okay to send (directions and dosage)

## 2015-01-10 NOTE — Telephone Encounter (Signed)
Pt was on this med in 2008 testosterone liquid . [facility] call [facility]

## 2015-01-11 NOTE — Telephone Encounter (Signed)
Spoke with patient per Dr. Jacinto Reap. Pt is going to contact [facility] with information on his testosterone information.

## 2015-01-11 NOTE — Telephone Encounter (Signed)
Because of his history of very high PSA/BPH, I would prefer he discuss with his urologist at [facility] pros and cons of testosterone therapy.  His issues above complicate his risk somewhat.

## 2015-02-08 ENCOUNTER — Encounter: Payer: Self-pay | Admitting: Family Medicine

## 2015-02-08 ENCOUNTER — Ambulatory Visit (INDEPENDENT_AMBULATORY_CARE_PROVIDER_SITE_OTHER): Payer: Commercial Managed Care - HMO | Admitting: Family Medicine

## 2015-02-08 VITALS — BP 132/80 | HR 88 | Temp 97.6°F | Wt 208.0 lb

## 2015-02-08 DIAGNOSIS — R7989 Other specified abnormal findings of blood chemistry: Secondary | ICD-10-CM

## 2015-02-08 DIAGNOSIS — N400 Enlarged prostate without lower urinary tract symptoms: Secondary | ICD-10-CM

## 2015-02-08 DIAGNOSIS — Q828 Other specified congenital malformations of skin: Secondary | ICD-10-CM

## 2015-02-08 DIAGNOSIS — L988 Other specified disorders of the skin and subcutaneous tissue: Secondary | ICD-10-CM | POA: Diagnosis not present

## 2015-02-08 DIAGNOSIS — L821 Other seborrheic keratosis: Secondary | ICD-10-CM

## 2015-02-08 DIAGNOSIS — E291 Testicular hypofunction: Secondary | ICD-10-CM

## 2015-02-08 NOTE — Progress Notes (Signed)
   Subjective:    Patient ID: Jimmy Rice, male    DOB: [birth year], 69 y.o.   MRN: [id number]  HPI Patient seen for several items  History of BPH. Recent TURP procedure end of March. He has done well since then. Good urine flow. Taken off pro scar and Flomax. Denies any dysuria. No gross hematuria.  History of low testosterone. He complains of increased fatigue. He states he had recent total testosterone 280 per urologist. He is interested in considering treatment. Was treated briefly with topical replacement years ago. No known contraindications.  Patient has some skin gross. Skin tags right face and left upper thigh. He has verrucous type lesion right parietal scalp. Occasionally itches. No history of skin cancer.  Past Medical History  Diagnosis Date  . HYPOGONADISM 01/18/2009  . HYPERTENSION 01/18/2009  . GERD 01/18/2009  . BENIGN PROSTATIC HYPERTROPHY, HX OF 04/04/2009  . Allergy   . Asthma     OA  . Blood transfusion   . Cataract   . Chronic kidney disease     cysts on kidneys   Past Surgical History  Procedure Laterality Date  . Colonoscopy    . Nasal sinus surgery    . Inguinal hernia repair      reports that he has quit smoking. He has never used smokeless tobacco. He reports that he does not drink alcohol or use illicit drugs. family history includes Stomach cancer in his paternal aunt. There is no history of Colon cancer, Esophageal cancer, or Rectal cancer. Allergies  Allergen Reactions  . Amlodipine Hives  . Flomax [Tamsulosin Hcl]     Difficulty breathing, nasal stuffiness      Review of Systems  Constitutional: Negative for fever, chills and fatigue.  Eyes: Negative for visual disturbance.  Respiratory: Negative for cough, chest tightness and shortness of breath.   Cardiovascular: Negative for chest pain, palpitations and leg swelling.  Genitourinary: Negative for dysuria.  Neurological: Negative for dizziness, syncope, weakness, light-headedness and  headaches.       Objective:   Physical Exam  Constitutional: He appears well-developed and well-nourished.  Cardiovascular: Normal rate and regular rhythm.   Pulmonary/Chest: Effort normal and breath sounds normal. No respiratory distress. He has no wheezes. He has no rales.  Musculoskeletal: He exhibits no edema.  Skin:  Patient has classic fleshy colored skin tag right cheek and left upper thigh. He has well-demarcated hyperkeratotic verruca's lesion right parietal scalp and this is about 5 mm diameter          Assessment & Plan:  #1 history of BPH. Improved following surgery and now off medications for BPH and doing well #2 low testosterone history. He is interested in considering replacement. He will try to get records of recent labs and also check with insurance regarding which medications might be covered #3 verrucous keratosis right parietal scalp. He has 2 benign skin tags including right face and left upper thigh. Discussed risk and benefits of treatment with liquid nitrogen patient consented. All 3 lesions were treated without difficulty. Follow-up promptly for signs of secondary infection

## 2015-02-08 NOTE — Progress Notes (Signed)
Pre visit review using our clinic review tool, if applicable. No additional management support is needed unless otherwise documented below in the visit note. 

## 2015-02-15 ENCOUNTER — Encounter: Payer: Self-pay | Admitting: Family Medicine

## 2015-02-15 ENCOUNTER — Ambulatory Visit (INDEPENDENT_AMBULATORY_CARE_PROVIDER_SITE_OTHER): Payer: Commercial Managed Care - HMO | Admitting: Family Medicine

## 2015-02-15 VITALS — BP 130/80 | HR 73 | Temp 98.1°F | Wt 208.0 lb

## 2015-02-15 DIAGNOSIS — R7989 Other specified abnormal findings of blood chemistry: Secondary | ICD-10-CM

## 2015-02-15 DIAGNOSIS — E291 Testicular hypofunction: Secondary | ICD-10-CM

## 2015-02-15 MED ORDER — TESTOSTERONE 40.5 MG/2.5GM (1.62%) TD GEL: TRANSDERMAL | Status: DC

## 2015-02-15 NOTE — Progress Notes (Signed)
   Subjective:    Patient ID: Jimmy Rice, male    DOB: 11-20-1945, 69 y.o.   MRN: [id number]  HPI Patient seen to discuss testosterone replacement. He has history of low testosterone went back several years. Recent TURP back in and of March is done extremely well since then. No history of prostate cancer. Previous biopsies negative. He checked with insurance and they apparently do cover AndroGel. He had recent testosterone level of less than 300 and this was checked back in March. He has increased fatigue. Decreased libido. No history of clotting issues. No liver issues. No cardiac issues. No recent chest pains.  Past Medical History  Diagnosis Date  . HYPOGONADISM 01/18/2009  . HYPERTENSION 01/18/2009  . GERD 01/18/2009  . BENIGN PROSTATIC HYPERTROPHY, HX OF 04/04/2009  . Allergy   . Asthma     OA  . Blood transfusion   . Cataract   . Chronic kidney disease     cysts on kidneys   Past Surgical History  Procedure Laterality Date  . Colonoscopy    . Nasal sinus surgery    . Inguinal hernia repair      reports that he has quit smoking. He has never used smokeless tobacco. He reports that he does not drink alcohol or use illicit drugs. family history includes Stomach cancer in his paternal aunt. There is no history of Colon cancer, Esophageal cancer, or Rectal cancer. Allergies  Allergen Reactions  . Amlodipine Hives  . Flomax [Tamsulosin Hcl]     Difficulty breathing, nasal stuffiness      Review of Systems  Constitutional: Negative for fatigue.  Eyes: Negative for visual disturbance.  Respiratory: Negative for cough, chest tightness and shortness of breath.   Cardiovascular: Negative for chest pain, palpitations and leg swelling.  Neurological: Negative for dizziness, syncope, weakness, light-headedness and headaches.       Objective:   Physical Exam  Constitutional: He appears well-developed and well-nourished. No distress.  Cardiovascular: Normal rate and regular  rhythm.  Exam reveals no gallop.   Pulmonary/Chest: Effort normal and breath sounds normal. No respiratory distress. He has no wheezes. He has no rales.          Assessment & Plan:  Low testosterone. Previous history of TURP recently. We discussed pros and cons of testosterone replacement. He does not have any known contraindications. No history of polycythemia. Start androgen gel 1.62% 1 pump spray per arm once daily and recheck testosterone level in one month

## 2015-02-15 NOTE — Progress Notes (Signed)
Pre visit review using our clinic review tool, if applicable. No additional management support is needed unless otherwise documented below in the visit note. 

## 2015-02-19 ENCOUNTER — Other Ambulatory Visit: Payer: Self-pay | Admitting: Family Medicine

## 2015-02-19 ENCOUNTER — Other Ambulatory Visit: Payer: Self-pay | Admitting: *Deleted

## 2015-02-19 MED ORDER — LOSARTAN POTASSIUM-HCTZ 100-12.5 MG PO TABS: ORAL_TABLET | ORAL | Status: DC

## 2015-02-19 NOTE — Telephone Encounter (Signed)
Rx done. 

## 2015-02-20 ENCOUNTER — Encounter: Payer: Self-pay | Admitting: Family Medicine

## 2015-02-20 ENCOUNTER — Telehealth: Payer: Self-pay | Admitting: Family Medicine

## 2015-02-20 NOTE — Telephone Encounter (Signed)
Pt request refill of the following: Testosterone (ANDROGEL) 40.5 MG/2.5GM (1.62%) GEL  Pt said [facility] need to know if it is the pump or the Gel     Phamacy: [facility]

## 2015-02-20 NOTE — Telephone Encounter (Signed)
Pump and dose is one pump spray per arm once daily

## 2015-02-21 MED ORDER — TESTOSTERONE 40.5 MG/2.5GM (1.62%) TD GEL: TRANSDERMAL | Status: DC

## 2015-02-21 NOTE — Telephone Encounter (Signed)
Called in Rx change.

## 2015-03-12 ENCOUNTER — Ambulatory Visit (INDEPENDENT_AMBULATORY_CARE_PROVIDER_SITE_OTHER): Payer: Commercial Managed Care - HMO | Admitting: Family Medicine

## 2015-03-12 ENCOUNTER — Encounter: Payer: Self-pay | Admitting: Family Medicine

## 2015-03-12 VITALS — BP 130/80 | HR 78 | Temp 98.4°F | Wt 209.0 lb

## 2015-03-12 DIAGNOSIS — L858 Other specified epidermal thickening: Secondary | ICD-10-CM | POA: Diagnosis not present

## 2015-03-12 DIAGNOSIS — IMO0002 Reserved for concepts with insufficient information to code with codable children: Secondary | ICD-10-CM

## 2015-03-12 NOTE — Progress Notes (Signed)
Pre visit review using our clinic review tool, if applicable. No additional management support is needed unless otherwise documented below in the visit note. 

## 2015-03-12 NOTE — Progress Notes (Signed)
   Subjective:    Patient ID: Jimmy Rice, male    DOB: 11-21-1945, 69 y.o.   MRN: [id number]  HPI Patient seen with whitish area right inner lower lip. Noted incidentally by his dentist recently. He has not had any associated pain or bleeding. No history of tobacco use. No history of recent trauma. No excessive alcohol use.  Past Medical History  Diagnosis Date  . HYPOGONADISM 01/18/2009  . HYPERTENSION 01/18/2009  . GERD 01/18/2009  . BENIGN PROSTATIC HYPERTROPHY, HX OF 04/04/2009  . Allergy   . Asthma     OA  . Blood transfusion   . Cataract   . Chronic kidney disease     cysts on kidneys   Past Surgical History  Procedure Laterality Date  . Colonoscopy    . Nasal sinus surgery    . Inguinal hernia repair      reports that he has quit smoking. He has never used smokeless tobacco. He reports that he does not drink alcohol or use illicit drugs. family history includes Stomach cancer in his paternal aunt. There is no history of Colon cancer, Esophageal cancer, or Rectal cancer. Allergies  Allergen Reactions  . Amlodipine Hives  . Flomax [Tamsulosin Hcl]     Difficulty breathing, nasal stuffiness      Review of Systems  Constitutional: Negative for appetite change and unexpected weight change.       Objective:   Physical Exam  Constitutional: He appears well-developed and well-nourished.  HENT:  Right inner lower lip reveals approximately 6-7 mm area of whitish discoloration. No ulceration. No bleeding. Minimally raised  Cardiovascular: Normal rate and regular rhythm.           Assessment & Plan:  Whitish discoloration right lower inner lip. Question leukoplakia. Referral to ENT for opinion regarding whether this should be biopsied.

## 2015-03-12 NOTE — Patient Instructions (Signed)
Leukoplakia Leukoplakia occurs when white patches develop in your mouth. They may show up on the insides of your cheeks, on or under your tongue, or on your gums. Leukoplakia can also develop on the outside of the male genitalia (vulva ). In rare cases, it can occur in the area around the anus. In some cases, leukoplakia predicts an increased risk of cancer.  CAUSES  Some of the many conditions that can cause or increase the risk of leukoplakia in the mouth include:  Irritation from rough teeth or dentures.  Any type of tobacco use, especially with alcohol.  Weakened immune system (such as occurs with HIV/AIDS or after a bone marrow transplant). Many conditions can also cause or increase the risk of leukoplakia of the vulva including:  Long lasting infections of the vulva.  Some skin diseases.  Long lasting irritation of the vulva.  Long-term use of steroid creams or ointments.  Weakened immune system (such as occurs with HIV/AIDS or after a bone marrow transplant). SYMPTOMS  The main symptom is the development of patches or flat areas in the mouth or on the vulva. These patches may be:  Odd-shaped.  Hard.  Raised.  White or grey in color (although may be reddened).  Unable to be wiped or scraped away.  Fuzzy.  Sensitive to spicy or acidic foods, touch or heat. DIAGNOSIS   Due to its distinct appearance, diagnosis of leukoplakia is often straightforward.  Your caregiver may choose to take a sample (biopsy) of a patch. Your caregiver may choose to take a sample (biopsy) of a patch, to verify that it is leukoplakia, and to make sure that it is not a form of cancer. TREATMENT  Leukoplakia is treated by:  Stopping tobacco use.  Repairing any rough teeth or dentures.  Surgical removal of the patch, using surgical knife (scalpel), laser, heat or cold.  Medicines that can be applied to the patches. HOME CARE INSTRUCTIONS   Do not use any form of tobacco.  Check  with the dentist to see if you need any repairs.  Only take over-the-counter or prescription medicines for pain, discomfort or fever as directed by your caregiver.  Avoid foods or beverages that seem to irritate the leukoplakia. SEEK MEDICAL CARE IF:  You notice more patches of leukoplakia.  You notice changes in size, shape or feeling of existing patches.  You develop fever (more than 100.5 F (38.1 C). SEEK IMMEDIATE MEDICAL CARE IF:  The patch becomes extremely painful, and the pain is not helped with prescribed medicine.  The patch begins to bleed, and you cannot stop the bleeding.  You have a patch in the mouth that becomes so swollen that it you have trouble eating and/or breathing.  You have a patch at the vulva that becomes so swollen that you have trouble passing urine.  An unexplained oral temperature above 102.0 F (38.9 C) develops. MAKE SURE YOU:   Understand these instructions.  Will watch your condition.  Will get help right away if you are not doing well or get worse. Document Released: 09/04/2008 Document Revised: 12/15/2011 Document Reviewed: 09/04/2008 [facility] Patient Information 2015 [place], [place]. This information is not intended to replace advice given to you by your health care provider. Make sure you discuss any questions you have with your health care provider.  We will call you with ENT referral.

## 2015-05-18 ENCOUNTER — Ambulatory Visit (INDEPENDENT_AMBULATORY_CARE_PROVIDER_SITE_OTHER): Payer: Commercial Managed Care - HMO | Admitting: Family Medicine

## 2015-05-18 ENCOUNTER — Ambulatory Visit (INDEPENDENT_AMBULATORY_CARE_PROVIDER_SITE_OTHER)
Admission: RE | Admit: 2015-05-18 | Discharge: 2015-05-18 | Disposition: A | Discharge status: Home / Self Care | Payer: Commercial Managed Care - HMO | Source: Ambulatory Visit | Attending: Family Medicine | Admitting: Family Medicine

## 2015-05-18 ENCOUNTER — Encounter: Payer: Self-pay | Admitting: Family Medicine

## 2015-05-18 VITALS — BP 136/82 | HR 90 | Temp 98.2°F | Wt 207.0 lb

## 2015-05-18 DIAGNOSIS — R7989 Other specified abnormal findings of blood chemistry: Secondary | ICD-10-CM

## 2015-05-18 DIAGNOSIS — M25551 Pain in right hip: Secondary | ICD-10-CM

## 2015-05-18 DIAGNOSIS — E291 Testicular hypofunction: Secondary | ICD-10-CM

## 2015-05-18 DIAGNOSIS — I10 Essential (primary) hypertension: Secondary | ICD-10-CM | POA: Diagnosis not present

## 2015-05-18 LAB — TESTOSTERONE: TESTOSTERONE: 193.96 ng/dL — AB (ref 300.00–890.00)

## 2015-05-18 IMAGING — CR DG HIP (WITH OR WITHOUT PELVIS) 2V BILAT
5 series · 5 of 5 positions shown · non-contrast
Comparison: [DATE].

CLINICAL DATA: Hip pain.  No known injury.

EXAM:
DG HIP (WITH OR WITHOUT PELVIS) 2V BILAT

[view not recorded (1 of 5)]
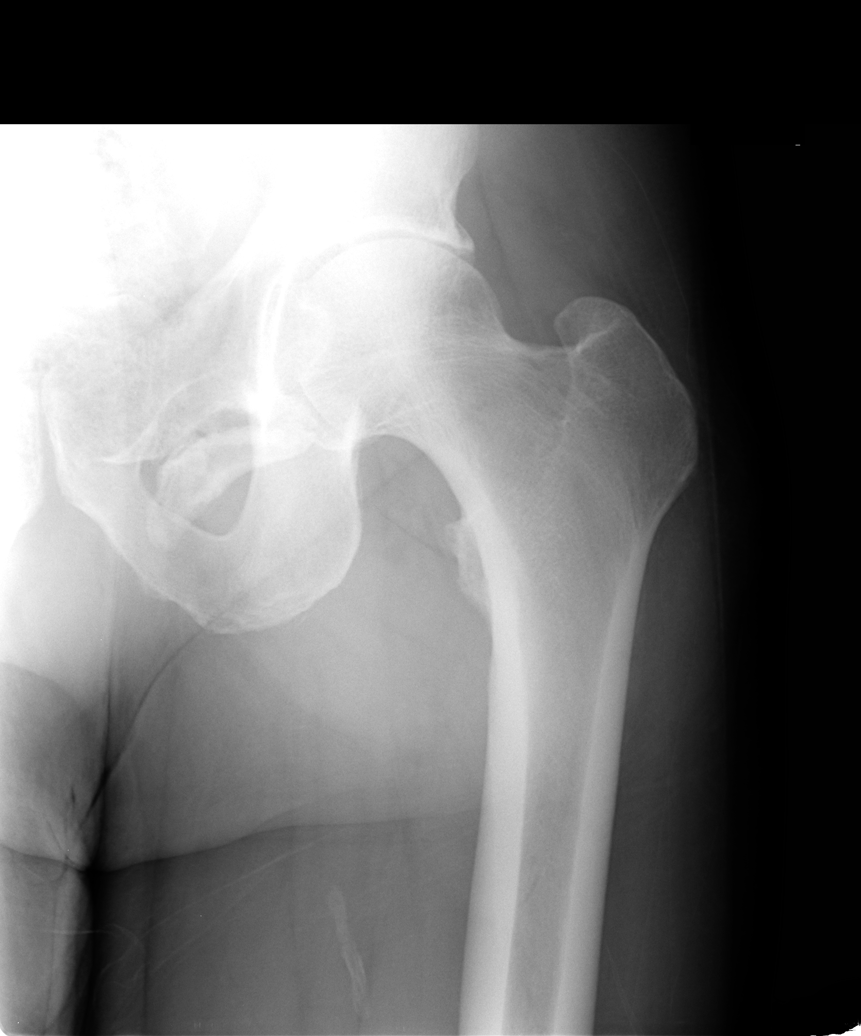

[view not recorded (2 of 5)]
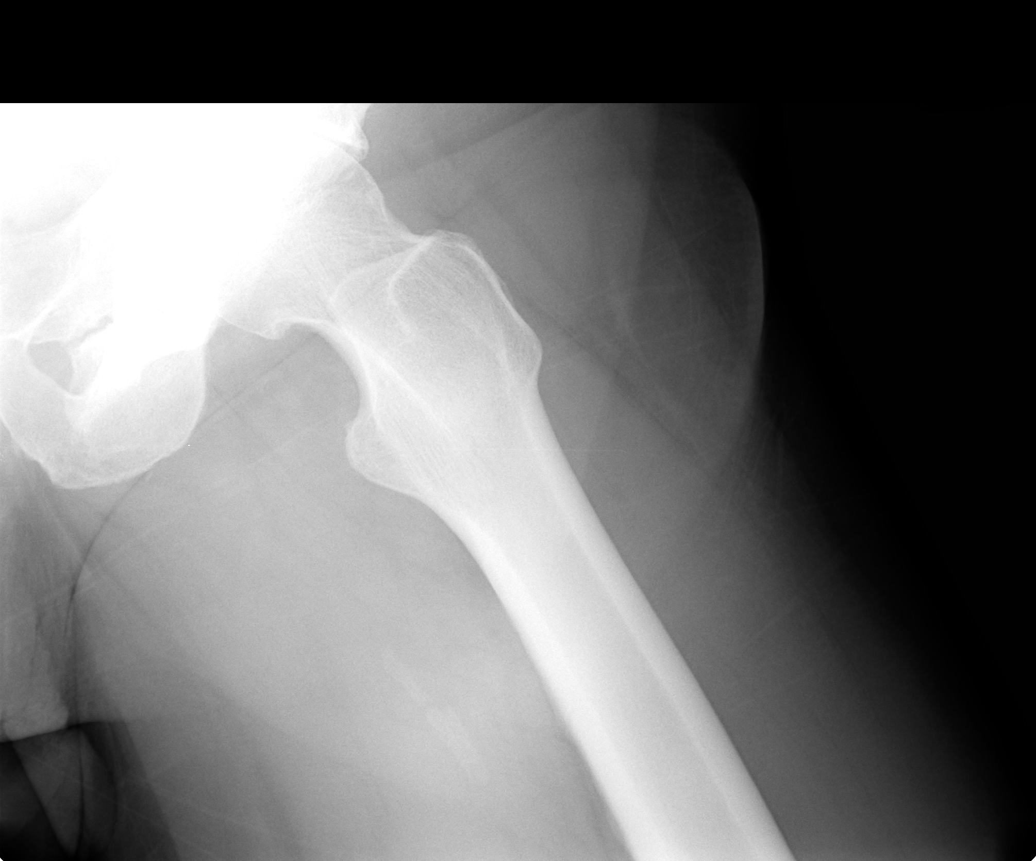

[view not recorded (3 of 5)]
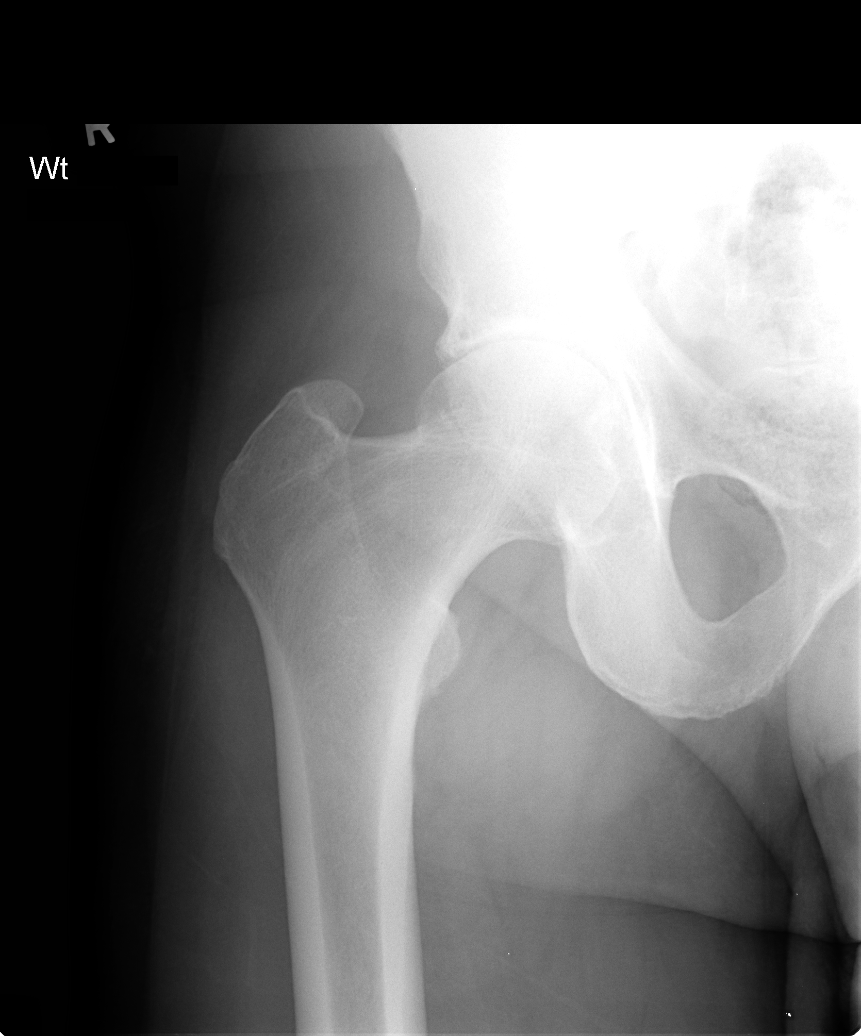

[view not recorded (4 of 5)]
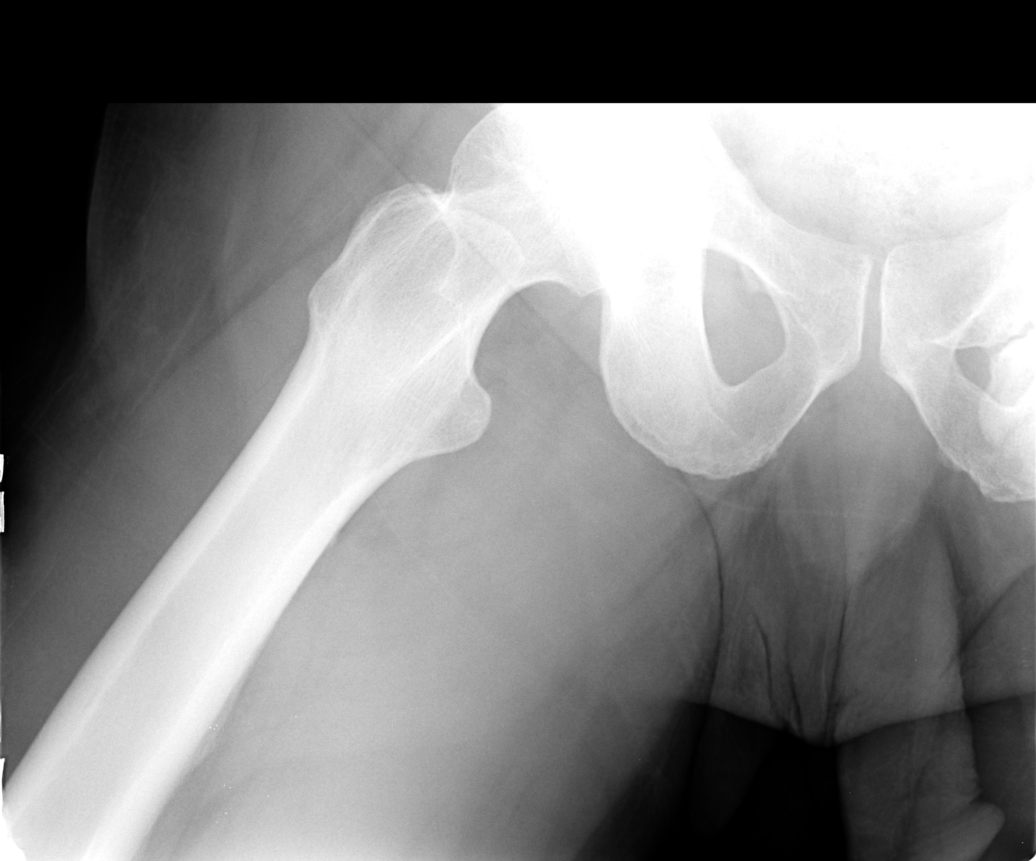

[view not recorded (5 of 5)]
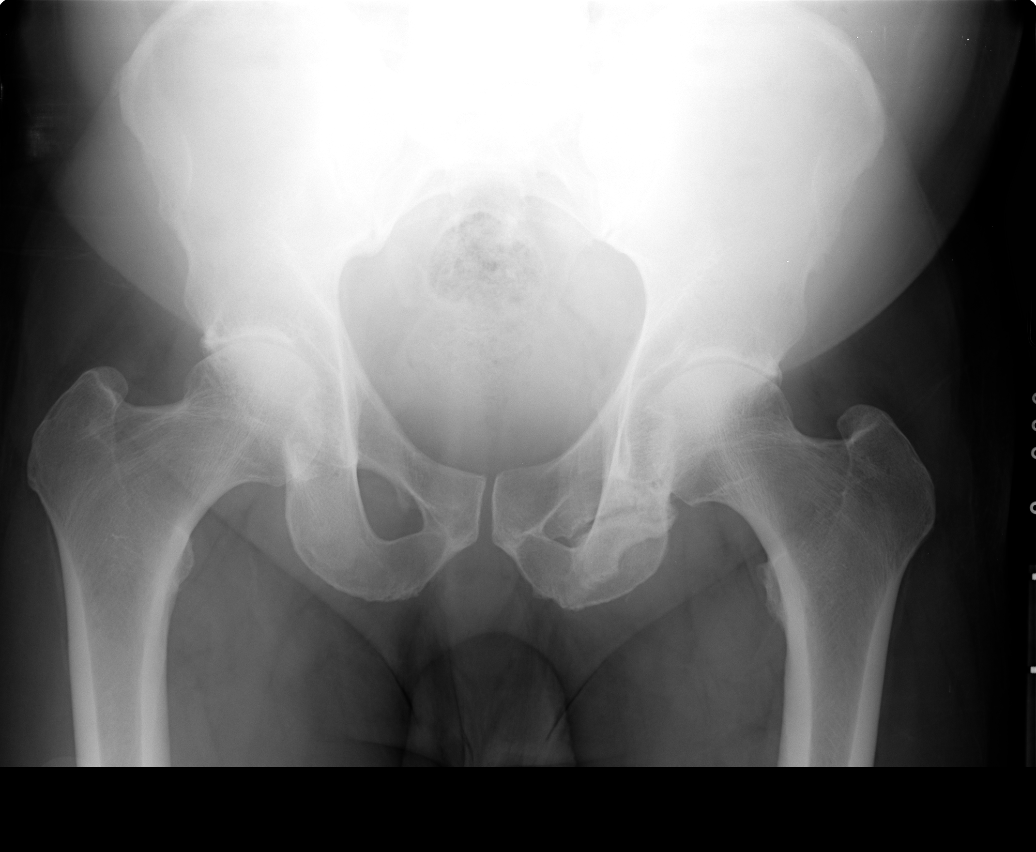

[5 of 5 positions shown; findings below may reference images not displayed]

FINDINGS: Deformity the left ischium is noted. This is most likely congenital
or related to old trauma. This is unchanged from prior study of
[DATE]. Mild degenerative changes both hips. No acute
abnormality identified.
IMPRESSION: 1. Mild degenerative changes both hips. No acute abnormality
identified.

2. Chronic unchanged deformity left ischium, no change from
[DATE]. Changes most likely congenital or related to old trauma.

## 2015-05-18 NOTE — Progress Notes (Signed)
Pre visit review using our clinic review tool, if applicable. No additional management support is needed unless otherwise documented below in the visit note. 

## 2015-05-18 NOTE — Addendum Note (Signed)
Addended by: Elmer Picker on: 05/18/2015 09:34 AM   Modules accepted: Orders

## 2015-05-18 NOTE — Progress Notes (Signed)
   Subjective:    Patient ID: Jimmy Rice, male    DOB: 03-Jan-1946, 69 y.o.   MRN: [id number]  HPI Patient here for multiple items  Low testosterone. Just started back on replacement about a month ago. Requesting levels. Energy level slightly improved  Hypertension and chronic kidney disease stage III. He remains on losartan HCTZ. Blood pressure well controlled. Previous hives with amlodipine. He is aware that HCTZ may start to lose its effect if he has progression of kidney disease. He is aware of the need to stay well-hydrated  Right hip pain. Radiates toward the thigh occasionally. Most of his pain is posterior lateral. Denies low back pain. No lower extremity weakness or numbness. No loss of urine or stool control. X-rays 4 years ago showed degenerative changes in both hips. This is gradually progressive.  Past Medical History  Diagnosis Date  . HYPOGONADISM 01/18/2009  . HYPERTENSION 01/18/2009  . GERD 01/18/2009  . BENIGN PROSTATIC HYPERTROPHY, HX OF 04/04/2009  . Allergy   . Asthma     OA  . Blood transfusion   . Cataract   . Chronic kidney disease     cysts on kidneys   Past Surgical History  Procedure Laterality Date  . Colonoscopy    . Nasal sinus surgery    . Inguinal hernia repair      reports that he has quit smoking. He has never used smokeless tobacco. He reports that he does not drink alcohol or use illicit drugs. family history includes Stomach cancer in his paternal aunt. There is no history of Colon cancer, Esophageal cancer, or Rectal cancer. Allergies  Allergen Reactions  . Amlodipine Hives  . Flomax [Tamsulosin Hcl]     Difficulty breathing, nasal stuffiness      Review of Systems  Constitutional: Negative for fatigue.  Eyes: Negative for visual disturbance.  Respiratory: Negative for cough, chest tightness and shortness of breath.   Cardiovascular: Negative for chest pain, palpitations and leg swelling.  Gastrointestinal: Negative for abdominal  pain.  Endocrine: Negative for polydipsia and polyuria.  Musculoskeletal: Positive for arthralgias.  Neurological: Negative for dizziness, syncope, weakness, light-headedness and headaches.       Objective:   Physical Exam  Constitutional: He appears well-developed and well-nourished.  Neck: Neck supple. No thyromegaly present.  Cardiovascular: Normal rate and regular rhythm.   Pulmonary/Chest: Effort normal and breath sounds normal. No respiratory distress. He has no wheezes. He has no rales.  Musculoskeletal: He exhibits no edema.  Fair range of motion right and left hips. He has some pain with internal rotation bilaterally Straight leg raise is negative bilaterally  Neurological:  Full-strength lower extremities with symmetric reflexes          Assessment & Plan:  #1 hypertension stable. We discussed that we may wish to discontinue the thiazide diaphoretic especially with any progressive kidney disease. He cannot take calcium channel blockers. He sees nephrologist in October and will get their input #2 history of low testosterone. Recently started back replacement. Recheck testosterone level today #3 right hip pain. Progressive over several years. He has known degenerative arthritis. Repeat x-rays. If significant progression consider orthopedic referral

## 2015-05-21 ENCOUNTER — Other Ambulatory Visit: Payer: Self-pay | Admitting: Family Medicine

## 2015-05-21 ENCOUNTER — Other Ambulatory Visit: Payer: Self-pay

## 2015-05-21 DIAGNOSIS — R7989 Other specified abnormal findings of blood chemistry: Secondary | ICD-10-CM

## 2015-05-25 ENCOUNTER — Telehealth: Payer: Self-pay | Admitting: Family Medicine

## 2015-05-25 MED ORDER — TESTOSTERONE 40.5 MG/2.5GM (1.62%) TD GEL: TRANSDERMAL | Status: DC

## 2015-05-25 NOTE — Telephone Encounter (Signed)
Pt request refill Testosterone (ANDROGEL) 40.5 MG/2.5GM (1.62%) GEL [facility]

## 2015-05-25 NOTE — Telephone Encounter (Signed)
Rx updated.

## 2015-06-22 ENCOUNTER — Other Ambulatory Visit: Payer: Self-pay | Admitting: Family Medicine

## 2015-07-23 ENCOUNTER — Telehealth: Payer: Self-pay | Admitting: Family Medicine

## 2015-07-23 NOTE — Telephone Encounter (Signed)
Pt would like to know does the androgel increase his lipids, triglycerides and LDL?  Pt just had labs and these are high. Pt wants to know if something he eats or the androgel pls call back

## 2015-07-24 NOTE — Telephone Encounter (Signed)
Both factors-diet and testosterone can have effects.

## 2015-07-26 NOTE — Telephone Encounter (Signed)
Patient is aware 

## 2015-10-26 ENCOUNTER — Other Ambulatory Visit (INDEPENDENT_AMBULATORY_CARE_PROVIDER_SITE_OTHER): Payer: Commercial Managed Care - HMO

## 2015-10-26 DIAGNOSIS — Z0000 Encounter for general adult medical examination without abnormal findings: Secondary | ICD-10-CM | POA: Diagnosis not present

## 2015-10-26 DIAGNOSIS — R7989 Other specified abnormal findings of blood chemistry: Secondary | ICD-10-CM

## 2015-10-26 DIAGNOSIS — Z125 Encounter for screening for malignant neoplasm of prostate: Secondary | ICD-10-CM

## 2015-10-26 DIAGNOSIS — E291 Testicular hypofunction: Secondary | ICD-10-CM | POA: Diagnosis not present

## 2015-10-26 LAB — LIPID PANEL
CHOLESTEROL: 141 mg/dL (ref 0–200)
HDL: 46 mg/dL (ref >39.00)
LDL Cholesterol: 75 mg/dL (ref 0–99)
NonHDL: 95.42
Total CHOL/HDL Ratio: 3
Triglycerides: 102 mg/dL (ref 0.0–149.0)
VLDL: 20.4 mg/dL (ref 0.0–40.0)

## 2015-10-26 LAB — CBC WITH DIFFERENTIAL/PLATELET
Basophils Absolute: 0 10*3/uL (ref 0.0–0.1)
Basophils Relative: 0.7 % (ref 0.0–3.0)
EOS PCT: 9 % — AB (ref 0.0–5.0)
Eosinophils Absolute: 0.4 10*3/uL (ref 0.0–0.7)
HCT: 45.2 % (ref 39.0–52.0)
HEMOGLOBIN: 14.5 g/dL (ref 13.0–17.0)
Lymphocytes Relative: 40.9 % (ref 12.0–46.0)
Lymphs Abs: 1.8 10*3/uL (ref 0.7–4.0)
MCHC: 32.1 g/dL (ref 30.0–36.0)
MCV: 92.2 fl (ref 78.0–100.0)
MONO ABS: 0.4 10*3/uL (ref 0.1–1.0)
MONOS PCT: 10.3 % (ref 3.0–12.0)
Neutro Abs: 1.7 10*3/uL (ref 1.4–7.7)
Neutrophils Relative %: 39.1 % — ABNORMAL LOW (ref 43.0–77.0)
Platelets: 177 10*3/uL (ref 150.0–400.0)
RBC: 4.91 Mil/uL (ref 4.22–5.81)
RDW: 13.4 % (ref 11.5–15.5)
WBC: 4.4 10*3/uL (ref 4.0–10.5)

## 2015-10-26 LAB — HEPATIC FUNCTION PANEL
ALBUMIN: 4.3 g/dL (ref 3.5–5.2)
ALT: 24 U/L (ref 0–53)
AST: 18 U/L (ref 0–37)
Alkaline Phosphatase: 57 U/L (ref 39–117)
Bilirubin, Direct: 0.2 mg/dL (ref 0.0–0.3)
TOTAL PROTEIN: 7.4 g/dL (ref 6.0–8.3)
Total Bilirubin: 0.9 mg/dL (ref 0.2–1.2)

## 2015-10-26 LAB — BASIC METABOLIC PANEL
BUN: 23 mg/dL (ref 6–23)
CO2: 31 mEq/L (ref 19–32)
Calcium: 9.6 mg/dL (ref 8.4–10.5)
Chloride: 103 mEq/L (ref 96–112)
Creatinine, Ser: 1.81 mg/dL — ABNORMAL HIGH (ref 0.40–1.50)
GFR: 47.9 mL/min — AB (ref >60.00)
Glucose, Bld: 105 mg/dL — ABNORMAL HIGH (ref 70–99)
POTASSIUM: 4.4 meq/L (ref 3.5–5.1)
SODIUM: 141 meq/L (ref 135–145)

## 2015-10-26 LAB — TSH: TSH: 2.04 u[IU]/mL (ref 0.35–4.50)

## 2015-10-26 LAB — PSA: PSA: 6.2 ng/mL — AB (ref 0.10–4.00)

## 2015-10-26 LAB — TESTOSTERONE: TESTOSTERONE: 305.21 ng/dL (ref 300.00–890.00)

## 2015-11-02 ENCOUNTER — Encounter: Payer: Self-pay | Admitting: Family Medicine

## 2015-11-02 ENCOUNTER — Ambulatory Visit (INDEPENDENT_AMBULATORY_CARE_PROVIDER_SITE_OTHER): Payer: Commercial Managed Care - HMO | Admitting: Family Medicine

## 2015-11-02 VITALS — BP 134/82 | HR 78 | Temp 97.8°F | Ht 71.5 in | Wt 206.9 lb

## 2015-11-02 DIAGNOSIS — Z0000 Encounter for general adult medical examination without abnormal findings: Secondary | ICD-10-CM

## 2015-11-02 NOTE — Progress Notes (Signed)
Pre visit review using our clinic review tool, if applicable. No additional management support is needed unless otherwise documented below in the visit note. 

## 2015-11-02 NOTE — Progress Notes (Signed)
   Subjective:    Patient ID: Jimmy Rice, male    DOB: July 07, 1946, 70 y.o.   MRN: [id number]  HPI   Patient is seen for physical exam. Chronic problems include chronic kidney disease , hypertension, BPH , history of chronic elevated PSA , GERD, low testosterone. He has been follow-up nephrology and they placed him recently on Lipitor. He is taking this without difficulty. His blood pressure has been stable. No headaches. No chest pains. He started back more consistent exercise recently. Remains on losartan HCTZ for his hypertension.  Immunizations are up-to-date  Past Medical History  Diagnosis Date  . HYPOGONADISM 01/18/2009  . HYPERTENSION 01/18/2009  . GERD 01/18/2009  . BENIGN PROSTATIC HYPERTROPHY, HX OF 04/04/2009  . Allergy   . Asthma     OA  . Blood transfusion   . Cataract   . Chronic kidney disease     cysts on kidneys   Past Surgical History  Procedure Laterality Date  . Colonoscopy    . Nasal sinus surgery    . Inguinal hernia repair      reports that he has quit smoking. He has never used smokeless tobacco. He reports that he does not drink alcohol or use illicit drugs. family history includes Stomach cancer in his paternal aunt. There is no history of Colon cancer, Esophageal cancer, or Rectal cancer. Allergies  Allergen Reactions  . Amlodipine Hives  . Flomax [Tamsulosin Hcl]     Difficulty breathing, nasal stuffiness      Review of Systems  Constitutional: Negative for fatigue.  Eyes: Negative for visual disturbance.  Respiratory: Negative for cough, chest tightness and shortness of breath.   Cardiovascular: Negative for chest pain, palpitations and leg swelling.  Endocrine: Negative for polydipsia and polyuria.  Neurological: Negative for dizziness, syncope, weakness, light-headedness and headaches.       Objective:   Physical Exam  Constitutional: He is oriented to person, place, and time. He appears well-developed and well-nourished. No  distress.  HENT:  Head: Normocephalic and atraumatic.  Right Ear: External ear normal.  Left Ear: External ear normal.  Mouth/Throat: Oropharynx is clear and moist.  Eyes: Conjunctivae and EOM are normal. Pupils are equal, round, and reactive to light.  Neck: Normal range of motion. Neck supple. No thyromegaly present.  Cardiovascular: Normal rate, regular rhythm and normal heart sounds.   No murmur heard. Pulmonary/Chest: No respiratory distress. He has no wheezes. He has no rales.  Abdominal: Soft. Bowel sounds are normal. He exhibits no distension and no mass. There is no tenderness. There is no rebound and no guarding.  Musculoskeletal: He exhibits no edema.  Lymphadenopathy:    He has no cervical adenopathy.  Neurological: He is alert and oriented to person, place, and time. He displays normal reflexes. No cranial nerve deficit.  Skin: No rash noted.  Psychiatric: He has a normal mood and affect.          Assessment & Plan:  Physical exam. Labs reviewed. His PSA is 6.2 which is stable. His lipids are improved. Creatinine slightly up to 1.8. Monitor blood pressure closely and be in touch if consistently greater than 140/90. He will continue with follow up with nephrology.

## 2015-11-05 ENCOUNTER — Other Ambulatory Visit: Payer: Self-pay | Admitting: Family Medicine

## 2015-11-05 MED ORDER — TESTOSTERONE 40.5 MG/2.5GM (1.62%) TD GEL: TRANSDERMAL | Status: DC

## 2015-12-19 ENCOUNTER — Ambulatory Visit: Payer: Commercial Managed Care - HMO | Admitting: Family Medicine

## 2016-01-08 ENCOUNTER — Other Ambulatory Visit (HOSPITAL_COMMUNITY)
Admission: RE | Admit: 2016-01-08 | Discharge: 2016-01-08 | Disposition: A | Discharge status: Home / Self Care | Payer: Commercial Managed Care - HMO | Source: Ambulatory Visit | Attending: Family Medicine | Admitting: Family Medicine

## 2016-01-08 ENCOUNTER — Ambulatory Visit (INDEPENDENT_AMBULATORY_CARE_PROVIDER_SITE_OTHER): Payer: Commercial Managed Care - HMO | Admitting: Family Medicine

## 2016-01-08 VITALS — BP 140/90 | HR 81 | Temp 98.2°F | Ht 71.5 in | Wt 210.0 lb

## 2016-01-08 DIAGNOSIS — R3589 Other polyuria: Secondary | ICD-10-CM

## 2016-01-08 DIAGNOSIS — Z202 Contact with and (suspected) exposure to infections with a predominantly sexual mode of transmission: Secondary | ICD-10-CM | POA: Diagnosis not present

## 2016-01-08 DIAGNOSIS — R358 Other polyuria: Secondary | ICD-10-CM | POA: Diagnosis not present

## 2016-01-08 DIAGNOSIS — R739 Hyperglycemia, unspecified: Secondary | ICD-10-CM

## 2016-01-08 DIAGNOSIS — Z113 Encounter for screening for infections with a predominantly sexual mode of transmission: Secondary | ICD-10-CM | POA: Insufficient documentation

## 2016-01-08 NOTE — Progress Notes (Signed)
Subjective:    Patient ID: Jimmy Rice, male    DOB: 02-24-1946, 70 y.o.   MRN: [id number]  HPI   Acute Visit  Patient seen today requesting STD testing and A1C check.  #1 STD Testing - Patient reports that his girlfriend informed him over one month ago that she had trichomonas.  He was originally seen at the [place] and screened for STD and treated with Flagyl for trichomonas.  The patients reports that all STD screenings were negative.  He is concerned as he has multiple sexual partners and desires to confirm that negative results were accurate.     #2 Hemoglobin A1C - Patient has concerns about diabetes as last fasting labs revealed a mildly elevated glucose level and he has been experiencing frequent urination.  He has a past medical history of CKD and  BPH.  He is concerned about diabetes due to medical history. Denies polydipsia or polyphagia. He has had no known episodes of hypoglycemia.  Currently on Ditropan for urine urgency per Urology.  Past Medical History  Diagnosis Date  . HYPOGONADISM 01/18/2009  . HYPERTENSION 01/18/2009  . GERD 01/18/2009  . BENIGN PROSTATIC HYPERTROPHY, HX OF 04/04/2009  . Allergy   . Asthma     OA  . Blood transfusion   . Cataract   . Chronic kidney disease     cysts on kidneys   Past Surgical History  Procedure Laterality Date  . Colonoscopy    . Nasal sinus surgery    . Inguinal hernia repair      reports that he has quit smoking. He has never used smokeless tobacco. He reports that he does not drink alcohol or use illicit drugs. family history includes Stomach cancer in his paternal aunt. There is no history of Colon cancer, Esophageal cancer, or Rectal cancer. Allergies  Allergen Reactions  . Amlodipine Hives  . Flomax [Tamsulosin Hcl]     Difficulty breathing, nasal stuffiness             Review of Systems  Constitutional: Negative.  Negative for fever and chills.  Gastrointestinal: Negative for nausea, vomiting and  abdominal pain.  Endocrine: Positive for polyuria. Negative for polydipsia.       See HPI  Genitourinary: Positive for frequency. Negative for dysuria, penile swelling, penile pain and testicular pain.       See HPI  Skin: Negative for rash.       Objective:   Physical Exam  Constitutional: He is oriented to person, place, and time. He appears well-developed and well-nourished.  HENT:  Head: Normocephalic and atraumatic.  Cardiovascular: Normal rate, regular rhythm, normal heart sounds and intact distal pulses.   Pulmonary/Chest: Breath sounds normal.  Neurological: He is alert and oriented to person, place, and time.  Skin: Skin is warm and dry.          Assessment & Plan:  #1. STD Exposure- Patient seen today with concern of exposure to STD from multiple sexual partners.  He received notification about one month ago, that one of his sexual partners had screened positive for trichomoniasis.  The patient reported receiving a full panel of serum and urine STD testing which patient reported all were negative.  He is requesting to be re screened to verify that he is free of STD.  Plan: Ordered screening for following:  HIV, Syphilis, Hepatitis B, Chlamydia, Gonorrhea, Trichomonas.    #2. Hyperglycemia-Stable.  Patient had recent labs and had a mildly elevated glucose level of  105 fasting.  He has had some urinary frequency and expresses concern about diabetes. Hemoglobin A1C is 5.2% today.  Plan: Continue to monitor fasting glucose annually.

## 2016-01-08 NOTE — Patient Instructions (Addendum)
Hyperglycemia Hyperglycemia occurs when the glucose (sugar) in your blood is too high. Hyperglycemia can happen for many reasons, but it most often happens to people who do not know they have diabetes or are not managing their diabetes properly.  CAUSES  Whether you have diabetes or not, there are other causes of hyperglycemia. Hyperglycemia can occur when you have diabetes, but it can also occur in other situations that you might not be as aware of, such as: Diabetes  If you have diabetes and are having problems controlling your blood glucose, hyperglycemia could occur because of some of the following reasons:  Not following your meal plan.  Not taking your diabetes medications or not taking it properly.  Exercising less or doing less activity than you normally do.  Being sick. Pre-diabetes  This cannot be ignored. Before people develop Type 2 diabetes, they almost always have "pre-diabetes." This is when your blood glucose levels are higher than normal, but not yet high enough to be diagnosed as diabetes. Research has shown that some long-term damage to the body, especially the heart and circulatory system, may already be occurring during pre-diabetes. If you take action to manage your blood glucose when you have pre-diabetes, you may delay or prevent Type 2 diabetes from developing. Stress  If you have diabetes, you may be "diet" controlled or on oral medications or insulin to control your diabetes. However, you may find that your blood glucose is higher than usual in the hospital whether you have diabetes or not. This is often referred to as "stress hyperglycemia." Stress can elevate your blood glucose. This happens because of hormones put out by the body during times of stress. If stress has been the cause of your high blood glucose, it can be followed regularly by your caregiver. That way he/she can make sure your hyperglycemia does not continue to get worse or progress to  diabetes. Steroids  Steroids are medications that act on the infection fighting system (immune system) to block inflammation or infection. One side effect can be a rise in blood glucose. Most people can produce enough extra insulin to allow for this rise, but for those who cannot, steroids make blood glucose levels go even higher. It is not unusual for steroid treatments to "uncover" diabetes that is developing. It is not always possible to determine if the hyperglycemia will go away after the steroids are stopped. A special blood test called an A1c is sometimes done to determine if your blood glucose was elevated before the steroids were started. SYMPTOMS  Thirsty.  Frequent urination.  Dry mouth.  Blurred vision.  Tired or fatigue.  Weakness.  Sleepy.  Tingling in feet or leg. DIAGNOSIS  Diagnosis is made by monitoring blood glucose in one or all of the following ways:  A1c test. This is a chemical found in your blood.  Fingerstick blood glucose monitoring.  Laboratory results. TREATMENT  First, knowing the cause of the hyperglycemia is important before the hyperglycemia can be treated. Treatment may include, but is not be limited to:  Education.  Change or adjustment in medications.  Change or adjustment in meal plan.  Treatment for an illness, infection, etc.  More frequent blood glucose monitoring.  Change in exercise plan.  Decreasing or stopping steroids.  Lifestyle changes. HOME CARE INSTRUCTIONS   Test your blood glucose as directed.  Exercise regularly. Your caregiver will give you instructions about exercise. Pre-diabetes or diabetes which comes on with stress is helped by exercising.  Eat wholesome,   balanced meals. Eat often and at regular, fixed times. Your caregiver or nutritionist will give you a meal plan to guide your sugar intake.  Being at an ideal weight is important. If needed, losing as little as 10 to 15 pounds may help improve blood  glucose levels. SEEK MEDICAL CARE IF:   You have questions about medicine, activity, or diet.  You continue to have symptoms (problems such as increased thirst, urination, or weight gain). SEEK IMMEDIATE MEDICAL CARE IF:   You are vomiting or have diarrhea.  Your breath smells fruity.  You are breathing faster or slower.  You are very sleepy or incoherent.  You have numbness, tingling, or pain in your feet or hands.  You have chest pain.  Your symptoms get worse even though you have been following your caregiver's orders.  If you have any other questions or concerns.   This information is not intended to replace advice given to you by your health care provider. Make sure you discuss any questions you have with your health care provider.   Document Released: 03/18/2001 Document Revised: 12/15/2011 Document Reviewed: 05/29/2015 Elsevier Interactive Patient Education [employer].   Sexually Transmitted Disease A sexually transmitted disease (STD) is a disease or infection often passed to another person during sex. However, STDs can be passed through nonsexual ways. An STD can be passed through:  Spit (saliva).  Semen.  Blood.  Mucus from the vagina.  Pee (urine). HOW CAN I LESSEN MY CHANCES OF GETTING AN STD?  Use:  Latex condoms.  Water-soluble lubricants with condoms. Do not use petroleum jelly or oils.  Dental dams. These are small pieces of latex that are used as a barrier during oral sex.  Avoid having more than one sex partner.  Do not have sex with someone who has other sex partners.  Do not have sex with anyone you do not know or who is at high risk for an STD.  Avoid risky sex that can break your skin.  Do not have sex if you have open sores on your mouth or skin.  Avoid drinking too much alcohol or taking illegal drugs. Alcohol and drugs can affect your good judgment.  Avoid oral and anal sex acts.  Get shots (vaccines) for HPV and  hepatitis.  If you are at risk of being infected with HIV, it is advised that you take a certain medicine daily to prevent HIV infection. This is called pre-exposure prophylaxis (PrEP). You may be at risk if:  You are a man who has sex with other men (MSM).  You are attracted to the opposite sex (heterosexual) and are having sex with more than one partner.  You take drugs with a needle.  You have sex with someone who has HIV.  Talk with your doctor about if you are at high risk of being infected with HIV. If you begin to take PrEP, get tested for HIV first. Get tested every 3 months for as long as you are taking PrEP.  Get tested for STDs every year if you are sexually active. If you are treated for an STD, get tested again 3 months after you are treated. WHAT SHOULD I DO IF I THINK I HAVE AN STD?  See your doctor.  Tell your sex partner(s) that you have an STD. They should be tested and treated.  Do not have sex until your doctor says it is okay. WHEN SHOULD I GET HELP? Get help right away if:  You  have bad belly (abdominal) pain.  You are a man and have puffiness (swelling) or pain in your testicles.  You are a woman and have puffiness in your vagina.   This information is not intended to replace advice given to you by your health care provider. Make sure you discuss any questions you have with your health care provider.   Document Released: 10/30/2004 Document Revised: 10/13/2014 Document Reviewed: 03/18/2013 Elsevier Interactive Patient Education [employer].

## 2016-01-08 NOTE — Progress Notes (Signed)
Pre visit review using our clinic review tool, if applicable. No additional management support is needed unless otherwise documented below in the visit note. 

## 2016-01-09 LAB — URINE CYTOLOGY ANCILLARY ONLY
Chlamydia: NEGATIVE
NEISSERIA GONORRHEA: NEGATIVE
TRICH (WINDOWPATH): NEGATIVE

## 2016-01-09 LAB — RPR

## 2016-01-09 LAB — HEPATITIS B SURFACE ANTIGEN: Hepatitis B Surface Ag: NEGATIVE

## 2016-01-09 LAB — POCT GLYCOSYLATED HEMOGLOBIN (HGB A1C): HEMOGLOBIN A1C: 5.3

## 2016-01-09 LAB — HIV ANTIBODY (ROUTINE TESTING W REFLEX): HIV 1&2 Ab, 4th Generation: NONREACTIVE

## 2016-01-14 ENCOUNTER — Ambulatory Visit (INDEPENDENT_AMBULATORY_CARE_PROVIDER_SITE_OTHER): Payer: Commercial Managed Care - HMO | Admitting: Family Medicine

## 2016-01-14 ENCOUNTER — Encounter: Payer: Self-pay | Admitting: Family Medicine

## 2016-01-14 VITALS — BP 120/80 | HR 78 | Temp 98.4°F | Ht 71.5 in | Wt 207.0 lb

## 2016-01-14 DIAGNOSIS — Z0000 Encounter for general adult medical examination without abnormal findings: Secondary | ICD-10-CM | POA: Diagnosis not present

## 2016-01-14 NOTE — Progress Notes (Signed)
Pre visit review using our clinic review tool, if applicable. No additional management support is needed unless otherwise documented below in the visit note. 

## 2016-01-14 NOTE — Patient Instructions (Addendum)
Confirm date of last tetanus.  Health Maintenance  Topic Date Due  . Hepatitis C Screening  May 13, 1946  . TETANUS/TDAP  10/07/2015  . INFLUENZA VACCINE  05/06/2016  . COLONOSCOPY  06/24/2022  . ZOSTAVAX  Completed  . PNA vac Low Risk Adult  Completed

## 2016-01-14 NOTE — Progress Notes (Signed)
   Subjective:    Patient ID: Jimmy Rice, male    DOB: [birth year], 70 y.o.   MRN: [id number]  HPI  Patient here for Medicare subsequent annual wellness visit. He had complete physical back in January. Patient also presented recently for some STD screening which is all normal. Recent hemoglobin A1c 5.3%. Chronic problems include history of GERD, hypertension, low testosterone, chronic kidney disease, BPH. He is followed by nephrology as well as urology.   No recent falls. No depression issues.  Past Medical History  Diagnosis Date  . HYPOGONADISM 01/18/2009  . HYPERTENSION 01/18/2009  . GERD 01/18/2009  . BENIGN PROSTATIC HYPERTROPHY, HX OF 04/04/2009  . Allergy   . Asthma     OA  . Blood transfusion   . Cataract   . Chronic kidney disease     cysts on kidneys   Past Surgical History  Procedure Laterality Date  . Colonoscopy    . Nasal sinus surgery    . Inguinal hernia repair      reports that he has quit smoking. He has never used smokeless tobacco. He reports that he does not drink alcohol or use illicit drugs. family history includes Stomach cancer in his paternal aunt. There is no history of Colon cancer, Esophageal cancer, or Rectal cancer. Allergies  Allergen Reactions  . Amlodipine Hives  . Flomax [Tamsulosin Hcl]     Difficulty breathing, nasal stuffiness      1.  Risk factors based on Past Medical , Social, and Family history reviewed and as indicated above with no changes 2.  Limitations in physical activities None.  No recent falls. 3.  Depression/mood No active depression or anxiety issues 4.  Hearing subjective deficits.  Has seen Audiologist. 5.  ADLs independent in all. 6.  Cognitive function (orientation to time and place, language, writing, speech,memory) no short or long term memory issues.  Language and judgement intact. 7.  Home Safety no issues 8.  Height, weight, and visual acuity.all stable. 9.  Counseling discussed regular exercise.   10.  Recommendation of preventive services. Confirm date of last tetanus. 11. Labs based on risk factors none 12. Care Plan as above. 13. Other Providers [facility], Nephrology-Dr Justin Mend, Dr [facility]. 14. Written schedule of screening/prevention services given to patient.   Review of Systems  Constitutional: Negative for fatigue.  Eyes: Negative for visual disturbance.  Respiratory: Negative for cough, chest tightness and shortness of breath.   Cardiovascular: Negative for chest pain, palpitations and leg swelling.  Musculoskeletal: Positive for arthralgias.  Neurological: Negative for dizziness, syncope, weakness, light-headedness and headaches.       Objective:   Physical Exam  Constitutional: He is oriented to person, place, and time. He appears well-developed and well-nourished.  HENT:  Right Ear: External ear normal.  Left Ear: External ear normal.  Mouth/Throat: Oropharynx is clear and moist.  Eyes: Pupils are equal, round, and reactive to light.  Neck: Neck supple. No thyromegaly present.  Cardiovascular: Normal rate and regular rhythm.   Pulmonary/Chest: Effort normal and breath sounds normal. No respiratory distress. He has no wheezes. He has no rales.  Musculoskeletal: He exhibits no edema.  Neurological: He is alert and oriented to person, place, and time.          Assessment & Plan:   Medicare subsequent annual wellness visit. Confirm date of last tetanus. Recommend continued regular exercise. Continue yearly flu vaccine.  Discussed fall prevention

## 2016-01-29 ENCOUNTER — Other Ambulatory Visit: Payer: Self-pay | Admitting: Family Medicine

## 2016-01-29 ENCOUNTER — Telehealth: Payer: Self-pay | Admitting: Family Medicine

## 2016-01-29 NOTE — Telephone Encounter (Signed)
Pharmacist is aware to send another RX.

## 2016-01-29 NOTE — Telephone Encounter (Signed)
Jimmy Rice with [facility] mail order pharmacy called because pt states the pump on his Testosterone (ANDROGEL) 40.5 MG/2.5GM (1.62%) GEL has a broken pump  Rx was shipped out in March  They need someone to call with a verbal ok to re ship another rx to pt.  They do not need a new prescription, just a verbal OK. Jimmy Rice with [facility] would like you call her on her direct line:  [phone] Ext:  [phone]

## 2016-02-22 ENCOUNTER — Encounter: Payer: Self-pay | Admitting: Family Medicine

## 2016-02-22 ENCOUNTER — Ambulatory Visit (INDEPENDENT_AMBULATORY_CARE_PROVIDER_SITE_OTHER): Payer: Commercial Managed Care - HMO | Admitting: Family Medicine

## 2016-02-22 VITALS — BP 140/80 | HR 89 | Temp 97.9°F | Ht 71.5 in | Wt 203.9 lb

## 2016-02-22 DIAGNOSIS — N50812 Left testicular pain: Secondary | ICD-10-CM

## 2016-02-22 NOTE — Progress Notes (Signed)
   Subjective:    Patient ID: Jimmy Rice, male    DOB: July 06, 1946, 70 y.o.   MRN: [id number]  HPI Patient has chronic history of left testicular pain. Recently went to the [place] and had ultrasound which showed small cystic changes left testicle with some chronic atrophy of the left testicle. He went to urologist then in [facility] and they favor benign cyst. Apparently, no further workup was recommended.  Patient returns now requesting second opinion. He has follow-up with that urologist later today for reported cystoscopy. Patient had a brother that died of some type of bladder cancer few years ago and he states this his height and his anxiety.  He's had some chronic intermittent left testicular pain for several years. Has seen multiple urologists the past. No recent appetite or weight changes No gross hematuria  Past Medical History  Diagnosis Date  . HYPOGONADISM 01/18/2009  . HYPERTENSION 01/18/2009  . GERD 01/18/2009  . BENIGN PROSTATIC HYPERTROPHY, HX OF 04/04/2009  . Allergy   . Asthma     OA  . Blood transfusion   . Cataract   . Chronic kidney disease     cysts on kidneys   Past Surgical History  Procedure Laterality Date  . Colonoscopy    . Nasal sinus surgery    . Inguinal hernia repair      reports that he has quit smoking. He has never used smokeless tobacco. He reports that he does not drink alcohol or use illicit drugs. family history includes Stomach cancer in his paternal aunt. There is no history of Colon cancer, Esophageal cancer, or Rectal cancer. Allergies  Allergen Reactions  . Amlodipine Hives  . Flomax [Tamsulosin Hcl]     Difficulty breathing, nasal stuffiness      Review of Systems  Constitutional: Negative for fever, chills, appetite change and unexpected weight change.  Genitourinary: Negative for dysuria and hematuria.       Objective:   Physical Exam  Constitutional: He appears well-developed and well-nourished.    Cardiovascular: Normal rate and regular rhythm.   Pulmonary/Chest: Effort normal and breath sounds normal. No respiratory distress. He has no wheezes. He has no rales.  Genitourinary:  Deferred as just done per Urology one week ago.          Assessment & Plan:  Patient has chronic intermittent left testicle pain with recent ultrasound revealing cystic changes left testicle. There was a report from radiology on initial x-ray stating that neoplasm cannot be ruled out based on appearance. Patient subsequently went to urologist as above and was felt this was likely benign. Patient requesting second opinion. Will set up with local urology. He is encouraged try to get copy of his ultrasound to take with him  Eulas Post MD [facility] at [facility]

## 2016-02-22 NOTE — Progress Notes (Signed)
Pre visit review using our clinic review tool, if applicable. No additional management support is needed unless otherwise documented below in the visit note. 

## 2016-02-22 NOTE — Patient Instructions (Signed)
We will set up Urology referral. 

## 2016-06-11 ENCOUNTER — Encounter: Payer: Self-pay | Admitting: Family Medicine

## 2016-06-11 ENCOUNTER — Ambulatory Visit (INDEPENDENT_AMBULATORY_CARE_PROVIDER_SITE_OTHER): Payer: Commercial Managed Care - HMO | Admitting: Family Medicine

## 2016-06-11 DIAGNOSIS — M1711 Unilateral primary osteoarthritis, right knee: Secondary | ICD-10-CM

## 2016-06-11 NOTE — Patient Instructions (Signed)
We will set up orthopedic referral.  Continue with knee and lower extremity exercises.

## 2016-06-11 NOTE — Progress Notes (Signed)
Pre visit review using our clinic review tool, if applicable. No additional management support is needed unless otherwise documented below in the visit note. 

## 2016-06-11 NOTE — Progress Notes (Signed)
Subjective:     Patient ID: Jimmy Rice, male   DOB: [birth year], 70 y.o.   MRN: [id number]  HPI Patient here regarding right knee pain. He's had some progressive right knee pain for several years. Used to run but is now having difficulty sometimes even with walking and especially with things like playing golf. He went to [facility] recently and reports that he had MRI scan which showed degeneration of the medial meniscus. He is currently getting physical therapy through the [place] for quadriceps strengthening. He states that orthopedic surgeon there had ordered some type of knee brace. Patient is specifically requesting to see a local orthopedist here for second opinion regarding further treatment options. He was told through the [place] that he would likely need total knee replacement soon.  Currently not taking anything for pain. He has not had any effusion. No warmth. No erythema. He has some popliteal swelling posterior right knee which has apparently been present for months.  Past Medical History:  Diagnosis Date  . Allergy   . Asthma    OA  . BENIGN PROSTATIC HYPERTROPHY, HX OF 04/04/2009  . Blood transfusion   . Cataract   . Chronic kidney disease    cysts on kidneys  . GERD 01/18/2009  . HYPERTENSION 01/18/2009  . HYPOGONADISM 01/18/2009   Past Surgical History:  Procedure Laterality Date  . COLONOSCOPY    . INGUINAL HERNIA REPAIR    . NASAL SINUS SURGERY      reports that he has quit smoking. He has never used smokeless tobacco. He reports that he does not drink alcohol or use drugs. family history includes Stomach cancer in his paternal aunt. Allergies  Allergen Reactions  . Amlodipine Hives  . Flomax [Tamsulosin Hcl]     Difficulty breathing, nasal stuffiness     Review of Systems  Constitutional: Negative for chills and fever.  Respiratory: Negative for shortness of breath.   Cardiovascular: Negative for chest pain.  Musculoskeletal: Positive for arthralgias.        Objective:   Physical Exam  Constitutional: He appears well-developed and well-nourished.  Cardiovascular: Normal rate and regular rhythm.   Pulmonary/Chest: Effort normal and breath sounds normal. No respiratory distress. He has no wheezes. He has no rales.  Musculoskeletal:  Right knee full range of motion. No warmth. No ecchymosis. No erythema. He has mild medial joint line tenderness. He has popliteal swelling posterior right knee consistent with Baker cyst.       Assessment:     Right knee pain.-Primary osteoarthritis right knee    Plan:     -Patient requesting referral to local orthopedist. We will set up -He is encouraged to continue with his strengthening exercises for quadriceps  Eulas Post MD [facility] at [facility]

## 2016-07-07 NOTE — H&P (Signed)
PREOPERATIVE H&P Patient ID: Jimmy Rice MRN: [id number] DOB/AGE: 70-Apr-1947 70 y.o.  Chief Complaint: OA RIGHT KNEE  Planned Procedure Date: 07/22/16 Medical & Cardiac Clearance by Dr. Edrick Oh Nephrology Clearance Pending  HPI: Jimmy Rice is a 70 y.o. male who presents for evaluation of OA RIGHT KNEE. The patient has a history of Stage 3 CKD, HTN, GERD, Low-T, and BPH.  He has pain and functional disability in the right knee due to arthritis and has failed non-surgical conservative treatments for greater than 12 weeks to include flexibility and strengthening excercises and activity modification.  Onset of symptoms was gradual, starting 1 years ago with gradually worsening course since that time. He has start up pain and pain that affects and limits his activities of daily living. He does have an MRI of this knee which confirms severe anterior medial arthritis. No other pathology.  Patient currently rates pain at 7 out of 10 with activity. Patient has worsening of pain with activity and weight bearing, pain that interferes with activities of daily living, pain with passive range of motion and joint swelling. There is no active infection.  Past Medical History:  Diagnosis Date  . Allergy   . Asthma    OA  . BENIGN PROSTATIC HYPERTROPHY, HX OF 04/04/2009  . Blood transfusion   . Cataract   . Chronic kidney disease    cysts on kidneys  . GERD 01/18/2009  . HYPERTENSION 01/18/2009  . HYPOGONADISM 01/18/2009   Past Surgical History:  Procedure Laterality Date  . COLONOSCOPY    . INGUINAL HERNIA REPAIR    . NASAL SINUS SURGERY     Allergies  Allergen Reactions  . Amlodipine Hives  . Flomax [Tamsulosin Hcl]     Difficulty breathing, nasal stuffiness   Prior to Admission medications   Medication Sig Start Date End Date Taking? Authorizing Provider  atorvastatin (LIPITOR) 10 MG tablet  10/22/15   Historical Provider, MD  losartan-hydrochlorothiazide (HYZAAR) 100-12.5 MG  tablet TAKE 1 TABLET EVERY DAY 01/29/16   Eulas Post, MD   Social History   Social History  . Marital status: Divorced    Spouse name: N/A  . Number of children: N/A  . Years of education: N/A   Social History Main Topics  . Smoking status: Former Research scientist (life sciences)  . Smokeless tobacco: Never Used  . Alcohol use No  . Drug use: No  . Sexual activity: Not on file   Other Topics Concern  . Not on file   Social History Narrative  . No narrative on file   Family History  Problem Relation Age of Onset  . Colon cancer Neg Hx   . Esophageal cancer Neg Hx   . Rectal cancer Neg Hx   . Stomach cancer Paternal Aunt     ROS: Currently denies lightheadedness, dizziness, Fever, chills, CP, SOB.   No personal history of DVT, PE, MI, or CVA. Top Partial.  No dentures All other systems have been reviewed and were otherwise currently negative with the exception of those mentioned in the HPI and as above.  Objective: Vitals: Ht: 6' Wt: 206 Temp: 98.1 BP: 133/84 Pulse: 62 O2 98% on room air. Physical Exam: General: Alert, NAD.  Antalgic Gait. HEENT: EOMI, Good Neck Extension  Pulm: No increased work of breathing.  Clear B/L A/P w/o crackle or wheeze.  CV: RRR, No m/g/r appreciated  GI: soft, NT, ND Neuro: Neuro grossly intact b/l upper/lower ext.  Sensation intact distally Skin: No lesions  in the area of chief complaint MSK/Surgical Site: Right knee w/o redness or effusion.  +medial JLT. ROM 0-115.  5/5 strength in extension and flexion.  +EHL/FHL.  NVI.  Stable Lachman's and varus and valgus stress.    Imaging Review Plain radiographs: Two views of his hip demonstrate moderate arthritis.  Five views of his knee, including a stress X-ray confirm anterior medial severe arthritis.  MRI of this knee which confirms severe anterior medial arthritis. No other pathology.   Assessment: OA RIGHT KNEE Principal Problem:   Primary osteoarthritis of right knee Active Problems:   Low  testosterone   Essential hypertension   Asthma   GERD   BPH (benign prostatic hyperplasia)   Chronic kidney disease   Plan: Plan for Procedure(s): Unicompartmental right KNEE ARTHROPLASTY  The patient history, physical exam, clinical judgement of the provider and imaging are consistent with end stage degenerative joint disease and Unicompartmental right knee arthroplasty is deemed medically necessary. The treatment options including medical management, injection therapy, and arthroplasty were discussed at length. The risks and benefits of Procedure(s): Unicompartmental right KNEE ARTHROPLASTY were presented and reviewed.  The risks of nonoperative treatment, versus surgical intervention including but not limited to continued pain, aseptic loosening, stiffness, dislocation/subluxation, infection, bleeding, nerve injury, blood clots, cardiopulmonary complications, morbidity, mortality, among others were discussed. The patient verbalizes understanding and wishes to proceed with the plan.  Patient is being admitted for inpatient treatment for surgery, pain control, PT, OT, prophylactic antibiotics, VTE prophylaxis, progressive ambulation, ADL's and discharge planning.   Dental prophylaxis discussed and recommended for 2 years postoperatively.  The patient does meet the criteria for TXA which will be used perioperatively via IV.   ASA 325 mg will be used postoperatively for DVT prophylaxis in addition to SCDs, and early ambulation. The patient is planning to be discharged home with home health services in care of his friend Su Grand. Baseline Cr 1.7.  Hold Losartan day of surgery - restart w/ BP >140/90 and patient eating/drinking.   Charna Elizabeth Martensen III,PA-C 07/07/2016 2:06 PM

## 2016-07-11 ENCOUNTER — Ambulatory Visit (HOSPITAL_COMMUNITY): Admission: RE | Admit: 2016-07-11 | Payer: Commercial Managed Care - HMO | Source: Ambulatory Visit

## 2016-07-11 ENCOUNTER — Other Ambulatory Visit: Payer: Self-pay

## 2016-07-11 ENCOUNTER — Encounter (HOSPITAL_COMMUNITY): Payer: Self-pay

## 2016-07-11 ENCOUNTER — Encounter (HOSPITAL_COMMUNITY)
Admission: RE | Admit: 2016-07-11 | Discharge: 2016-07-11 | Disposition: A | Discharge status: Home / Self Care | Payer: Commercial Managed Care - HMO | Source: Ambulatory Visit | Attending: Orthopedic Surgery | Admitting: Orthopedic Surgery

## 2016-07-11 DIAGNOSIS — Z01812 Encounter for preprocedural laboratory examination: Secondary | ICD-10-CM | POA: Diagnosis present

## 2016-07-11 DIAGNOSIS — I10 Essential (primary) hypertension: Secondary | ICD-10-CM | POA: Insufficient documentation

## 2016-07-11 DIAGNOSIS — Z01810 Encounter for preprocedural cardiovascular examination: Secondary | ICD-10-CM | POA: Diagnosis not present

## 2016-07-11 DIAGNOSIS — M1711 Unilateral primary osteoarthritis, right knee: Secondary | ICD-10-CM

## 2016-07-11 HISTORY — DX: Other complications of anesthesia, initial encounter: T88.59XA

## 2016-07-11 HISTORY — DX: Adverse effect of unspecified anesthetic, initial encounter: T41.45XA

## 2016-07-11 LAB — CBC
HCT: 46.2 % (ref 39.0–52.0)
Hemoglobin: 15.1 g/dL (ref 13.0–17.0)
MCH: 29.8 pg (ref 26.0–34.0)
MCHC: 32.7 g/dL (ref 30.0–36.0)
MCV: 91.3 fL (ref 78.0–100.0)
PLATELETS: 178 10*3/uL (ref 150–400)
RBC: 5.06 MIL/uL (ref 4.22–5.81)
RDW: 12.6 % (ref 11.5–15.5)
WBC: 4.2 10*3/uL (ref 4.0–10.5)

## 2016-07-11 LAB — TYPE AND SCREEN
ABO/RH(D): O POS
Antibody Screen: NEGATIVE

## 2016-07-11 LAB — BASIC METABOLIC PANEL
ANION GAP: 10 (ref 5–15)
BUN: 17 mg/dL (ref 6–20)
CALCIUM: 9.7 mg/dL (ref 8.9–10.3)
CO2: 28 mmol/L (ref 22–32)
Chloride: 102 mmol/L (ref 101–111)
Creatinine, Ser: 1.7 mg/dL — ABNORMAL HIGH (ref 0.61–1.24)
GFR calc non Af Amer: 39 mL/min — ABNORMAL LOW (ref >60)
GFR, EST AFRICAN AMERICAN: 45 mL/min — AB (ref >60)
Glucose, Bld: 108 mg/dL — ABNORMAL HIGH (ref 65–99)
POTASSIUM: 3.4 mmol/L — AB (ref 3.5–5.1)
Sodium: 140 mmol/L (ref 135–145)

## 2016-07-11 LAB — SURGICAL PCR SCREEN
MRSA, PCR: NEGATIVE
STAPHYLOCOCCUS AUREUS: NEGATIVE

## 2016-07-11 LAB — APTT: APTT: 31 s (ref 24–36)

## 2016-07-11 LAB — ABO/RH: ABO/RH(D): O POS

## 2016-07-11 LAB — PROTIME-INR
INR: 1.1
PROTHROMBIN TIME: 14.3 s (ref 11.4–15.2)

## 2016-07-11 NOTE — Pre-Procedure Instructions (Signed)
    Jimmy Rice  07/11/2016      [place], [place] [address] [place] [place] [address] Phone: [phone] Fax: [facility] [place], [place] [address] Phone: [phone] Fax: [phone]    Your procedure is scheduled on 07/22/16.  Report to [facility] Admitting at 830 A.M.  Call this number if you have problems the morning of surgery:  [phone]   Remember:  Do not eat food or drink liquids after midnight.  Take these medicines the morning of surgery with A SIP OF WATER --none   Do not wear jewelry, make-up or nail polish.  Do not wear lotions, powders, or perfumes, or deoderant.  Do not shave 48 hours prior to surgery.  Men may shave face and neck.  Do not bring valuables to the hospital.  [facility] is not responsible for any belongings or valuables.  Contacts, dentures or bridgework may not be worn into surgery.  Leave your suitcase in the car.  After surgery it may be brought to your room.  For patients admitted to the hospital, discharge time will be determined by your treatment team.  Patients discharged the day of surgery will not be allowed to drive home.   Name and phone number of your driver:    Special instructions:  Do not take any aspirin,anti-inflammatories,vitamins,or herbal supplements 5-7 days prior to surgery.  Please read over the following fact sheets that you were given. MRSA Information

## 2016-07-12 LAB — URINE CULTURE: Culture: NO GROWTH

## 2016-07-15 ENCOUNTER — Ambulatory Visit (INDEPENDENT_AMBULATORY_CARE_PROVIDER_SITE_OTHER): Payer: Commercial Managed Care - HMO | Admitting: Family Medicine

## 2016-07-15 ENCOUNTER — Encounter: Payer: Self-pay | Admitting: Family Medicine

## 2016-07-15 VITALS — BP 128/92 | HR 69 | Temp 97.7°F | Ht 72.0 in | Wt 202.1 lb

## 2016-07-15 DIAGNOSIS — E876 Hypokalemia: Secondary | ICD-10-CM | POA: Diagnosis not present

## 2016-07-15 DIAGNOSIS — Z01818 Encounter for other preprocedural examination: Secondary | ICD-10-CM | POA: Diagnosis not present

## 2016-07-15 DIAGNOSIS — I10 Essential (primary) hypertension: Secondary | ICD-10-CM | POA: Diagnosis not present

## 2016-07-15 DIAGNOSIS — N183 Chronic kidney disease, stage 3 unspecified: Secondary | ICD-10-CM

## 2016-07-15 NOTE — Progress Notes (Signed)
Subjective:     Patient ID: Jimmy Rice, male   DOB: 12-Oct-1945, 70 y.o.   MRN: [id number]  HPI Patient seen for preoperative clearance. He'll be getting either partial or total right knee replacement next week. His chronic problems include history of hypertension, chronic kidney disease stage III followed by nephrology, BPH, elevated PSA without evidence for prostate cancer. No cardiac history. Nonsmoker. Hypertension treated with losartan HCTZ. Also takes Lipitor for hyperlipidemia. Patient had recent EKG and this was reviewed which showed normal sinus rhythm with no acute changes.  Has not had any recent exertional dyspnea or chest discomfort. No chronic pulmonary issues. No family history of premature CAD.  Blood pressure has been well controlled on losartan HCTZ Recent MRSA screen negative.  Recent preoperative labs reviewed. Potassium minimally low 3.4. Creatinine stable Hemoglobin normal. Coags normal.  Past Medical History:  Diagnosis Date  . Allergy   . Asthma    OA  . BENIGN PROSTATIC HYPERTROPHY, HX OF 04/04/2009  . Blood transfusion   . Cataract    none  . Chronic kidney disease    cysts on kidneys, 3rd stage kidney disease  . Complication of anesthesia    woke up "fighting"  confusion  . GERD 01/18/2009  . HYPERTENSION 01/18/2009  . HYPOGONADISM 01/18/2009   Past Surgical History:  Procedure Laterality Date  . COLONOSCOPY    . INGUINAL HERNIA REPAIR    . NASAL SINUS SURGERY    . TRANSURETHRAL RESECTION OF PROSTATE      reports that he quit smoking about 67 years ago. He has never used smokeless tobacco. He reports that he does not drink alcohol or use drugs. family history includes Stomach cancer in his paternal aunt. Allergies  Allergen Reactions  . Amlodipine Hives  . Flomax [Tamsulosin Hcl]     Difficulty breathing, nasal stuffiness     Review of Systems  Constitutional: Negative for chills, fatigue and fever.  Eyes: Negative for visual disturbance.   Respiratory: Negative for cough, chest tightness and shortness of breath.   Cardiovascular: Negative for chest pain, palpitations and leg swelling.  Endocrine: Negative for polydipsia and polyuria.  Neurological: Negative for dizziness, syncope, weakness, light-headedness and headaches.       Objective:   Physical Exam  Constitutional: He is oriented to person, place, and time. He appears well-developed and well-nourished.  HENT:  Right Ear: External ear normal.  Left Ear: External ear normal.  Mouth/Throat: Oropharynx is clear and moist.  Eyes: Pupils are equal, round, and reactive to light.  Neck: Neck supple. No thyromegaly present.  Cardiovascular: Normal rate and regular rhythm.   Pulmonary/Chest: Effort normal and breath sounds normal. No respiratory distress. He has no wheezes. He has no rales.  Musculoskeletal: He exhibits no edema.  Neurological: He is alert and oriented to person, place, and time.       Assessment:     #1 preoperative clearance. No known medical or cardiac contraindications for surgery  #2 hypertension stable  #3 chronic kidney disease stage III  #4 mild hypokalemia by recent labs    Plan:    - forms completed for surgery. No contraindications known.  -Continue current medications  -Continue close follow-up with nephrology   Eulas Post MD [facility]

## 2016-07-15 NOTE — Progress Notes (Signed)
Pre visit review using our clinic review tool, if applicable. No additional management support is needed unless otherwise documented below in the visit note. 

## 2016-07-21 ENCOUNTER — Encounter (HOSPITAL_COMMUNITY): Payer: Self-pay | Admitting: Anesthesiology

## 2016-07-21 NOTE — Anesthesia Preprocedure Evaluation (Addendum)
Anesthesia Evaluation  Patient identified by MRN, date of birth, ID band Patient awake    Reviewed: Allergy & Precautions, NPO status   History of Anesthesia Complications (+) history of anesthetic complications  Airway Mallampati: I  TM Distance: >3 FB Neck ROM: Full    Dental  (+) Upper Dentures, Poor Dentition   Pulmonary asthma , former smoker,    Pulmonary exam normal breath sounds clear to auscultation       Cardiovascular hypertension, Pt. on medications Normal cardiovascular exam Rhythm:Regular Rate:Normal     Neuro/Psych negative psych ROS   GI/Hepatic negative GI ROS, Neg liver ROS, GERD  Medicated and Controlled,  Endo/Other  negative endocrine ROS  Renal/GU Renal InsufficiencyRenal disease   BPH    Musculoskeletal  (+) Arthritis , OA Right knee    Abdominal   Peds  Hematology negative hematology ROS (+)   Anesthesia Other Findings   Reproductive/Obstetrics                            Lab Results  Component Value Date   INR 1.10 07/11/2016   No results found for: PTT   Chemistry      Component Value Date/Time   NA 140 07/11/2016 1137   K 3.4 (L) 07/11/2016 1137   CL 102 07/11/2016 1137   CO2 28 07/11/2016 1137   BUN 17 07/11/2016 1137   CREATININE 1.70 (H) 07/11/2016 1137      Component Value Date/Time   CALCIUM 9.7 07/11/2016 1137   ALKPHOS 57 10/26/2015 1031   AST 18 10/26/2015 1031   ALT 24 10/26/2015 1031   BILITOT 0.9 10/26/2015 1031     Lab Results  Component Value Date   WBC 4.2 07/11/2016   HGB 15.1 07/11/2016   HCT 46.2 07/11/2016   MCV 91.3 07/11/2016   PLT 178 07/11/2016   EKG: normal EKG, normal sinus rhythm.  Anesthesia Physical Anesthesia Plan  ASA: III  Anesthesia Plan: Spinal   Post-op Pain Management:  Regional for Post-op pain   Induction:   Airway Management Planned: Nasal Cannula and Natural Airway  Additional Equipment:    Intra-op Plan:   Post-operative Plan:   Informed Consent:   Plan Discussed with:   Anesthesia Plan Comments: (Adductor canal block for post op pain management.)      Anesthesia Quick Evaluation

## 2016-07-22 ENCOUNTER — Observation Stay (HOSPITAL_COMMUNITY): Payer: Commercial Managed Care - HMO

## 2016-07-22 ENCOUNTER — Ambulatory Visit (HOSPITAL_COMMUNITY): Payer: Commercial Managed Care - HMO | Admitting: Anesthesiology

## 2016-07-22 ENCOUNTER — Encounter (HOSPITAL_COMMUNITY)
Admission: RE | Disposition: A | Discharge status: Home / Self Care | Payer: Self-pay | Source: Ambulatory Visit | Attending: Orthopedic Surgery

## 2016-07-22 ENCOUNTER — Encounter (HOSPITAL_COMMUNITY): Payer: Self-pay | Admitting: Certified Registered Nurse Anesthetist

## 2016-07-22 ENCOUNTER — Observation Stay (HOSPITAL_COMMUNITY)
Admission: RE | Admit: 2016-07-22 | Discharge: 2016-07-23 | Disposition: A | Discharge status: Home / Self Care | Payer: Commercial Managed Care - HMO | Source: Ambulatory Visit | Attending: Orthopedic Surgery | Admitting: Orthopedic Surgery

## 2016-07-22 DIAGNOSIS — Z87891 Personal history of nicotine dependence: Secondary | ICD-10-CM | POA: Diagnosis not present

## 2016-07-22 DIAGNOSIS — N400 Enlarged prostate without lower urinary tract symptoms: Secondary | ICD-10-CM | POA: Diagnosis present

## 2016-07-22 DIAGNOSIS — M1711 Unilateral primary osteoarthritis, right knee: Secondary | ICD-10-CM | POA: Diagnosis present

## 2016-07-22 DIAGNOSIS — J45909 Unspecified asthma, uncomplicated: Secondary | ICD-10-CM | POA: Diagnosis present

## 2016-07-22 DIAGNOSIS — N281 Cyst of kidney, acquired: Secondary | ICD-10-CM | POA: Diagnosis not present

## 2016-07-22 DIAGNOSIS — K219 Gastro-esophageal reflux disease without esophagitis: Secondary | ICD-10-CM | POA: Diagnosis present

## 2016-07-22 DIAGNOSIS — E291 Testicular hypofunction: Secondary | ICD-10-CM | POA: Diagnosis not present

## 2016-07-22 DIAGNOSIS — I10 Essential (primary) hypertension: Secondary | ICD-10-CM | POA: Diagnosis present

## 2016-07-22 DIAGNOSIS — N183 Chronic kidney disease, stage 3 unspecified: Secondary | ICD-10-CM | POA: Diagnosis present

## 2016-07-22 DIAGNOSIS — Z888 Allergy status to other drugs, medicaments and biological substances status: Secondary | ICD-10-CM | POA: Insufficient documentation

## 2016-07-22 DIAGNOSIS — I129 Hypertensive chronic kidney disease with stage 1 through stage 4 chronic kidney disease, or unspecified chronic kidney disease: Secondary | ICD-10-CM | POA: Diagnosis not present

## 2016-07-22 DIAGNOSIS — R7989 Other specified abnormal findings of blood chemistry: Secondary | ICD-10-CM | POA: Diagnosis present

## 2016-07-22 HISTORY — PX: REPLACEMENT UNICONDYLAR JOINT KNEE: SUR1227

## 2016-07-22 HISTORY — PX: PARTIAL KNEE ARTHROPLASTY: SHX2174

## 2016-07-22 HISTORY — DX: Chronic kidney disease, stage 3 (moderate): N18.3

## 2016-07-22 HISTORY — DX: Unspecified asthma, uncomplicated: J45.909

## 2016-07-22 HISTORY — DX: Chronic kidney disease, stage 3 unspecified: N18.30

## 2016-07-22 HISTORY — DX: Unspecified osteoarthritis, unspecified site: M19.90

## 2016-07-22 IMAGING — DX DG KNEE 1-2V PORT*R*
2 series · 2 of 2 positions shown · non-contrast
Comparison: No recent prior.

CLINICAL DATA: Knee replacement.

EXAM:
PORTABLE RIGHT KNEE - 1-2 VIEW

[knee lat (1 of 2)]
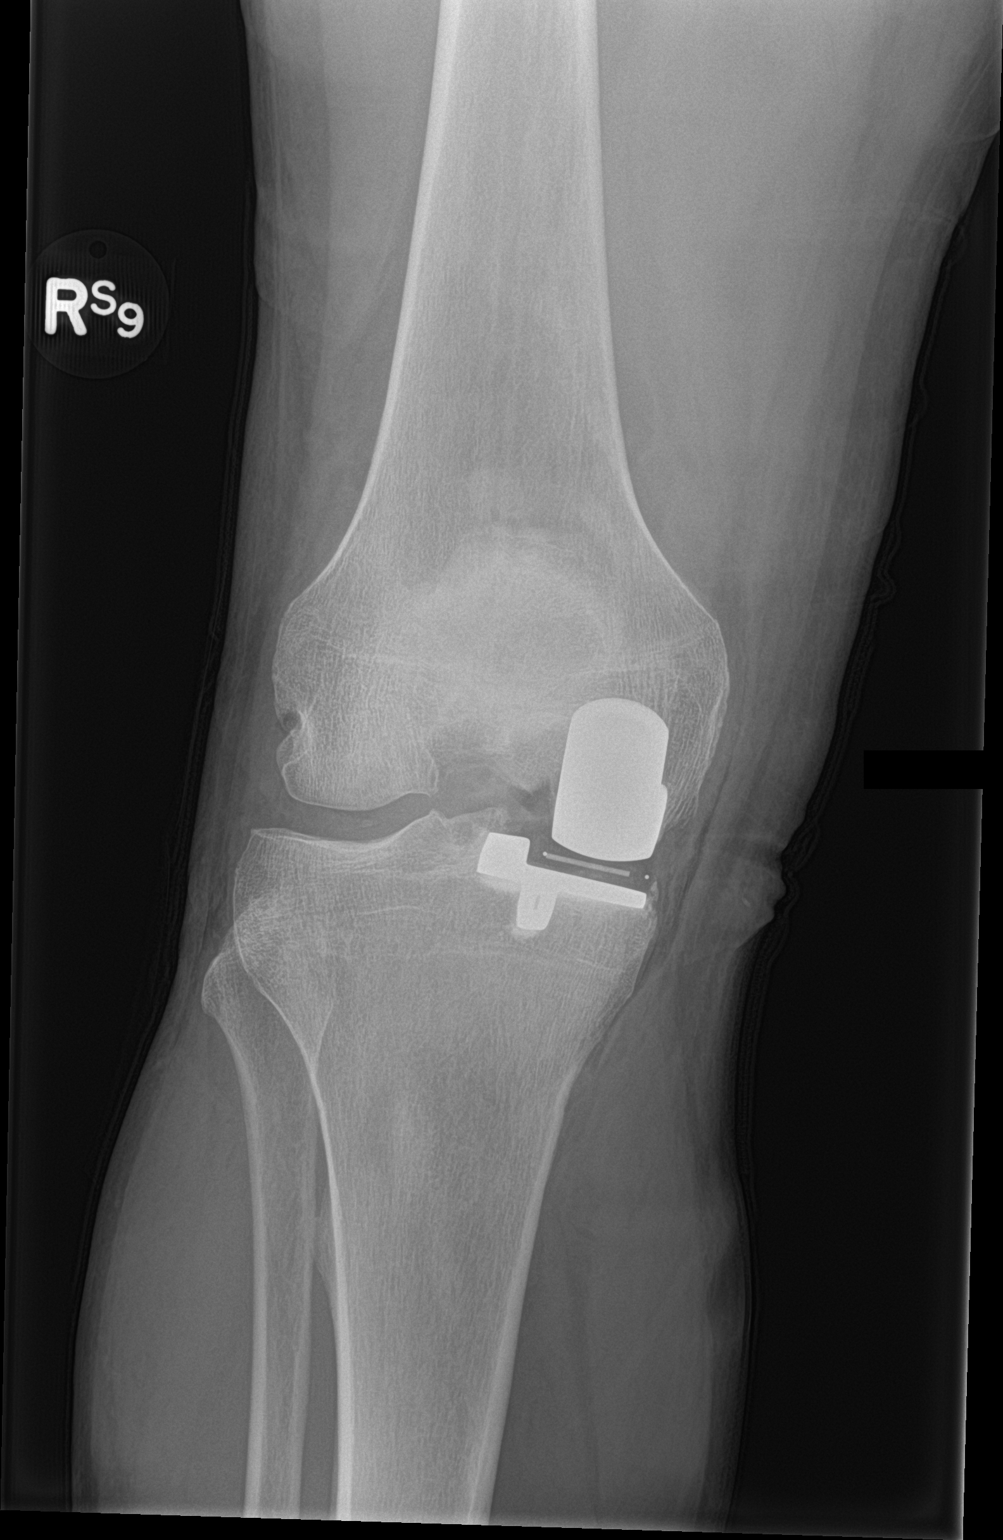

[knee lat (2 of 2)]
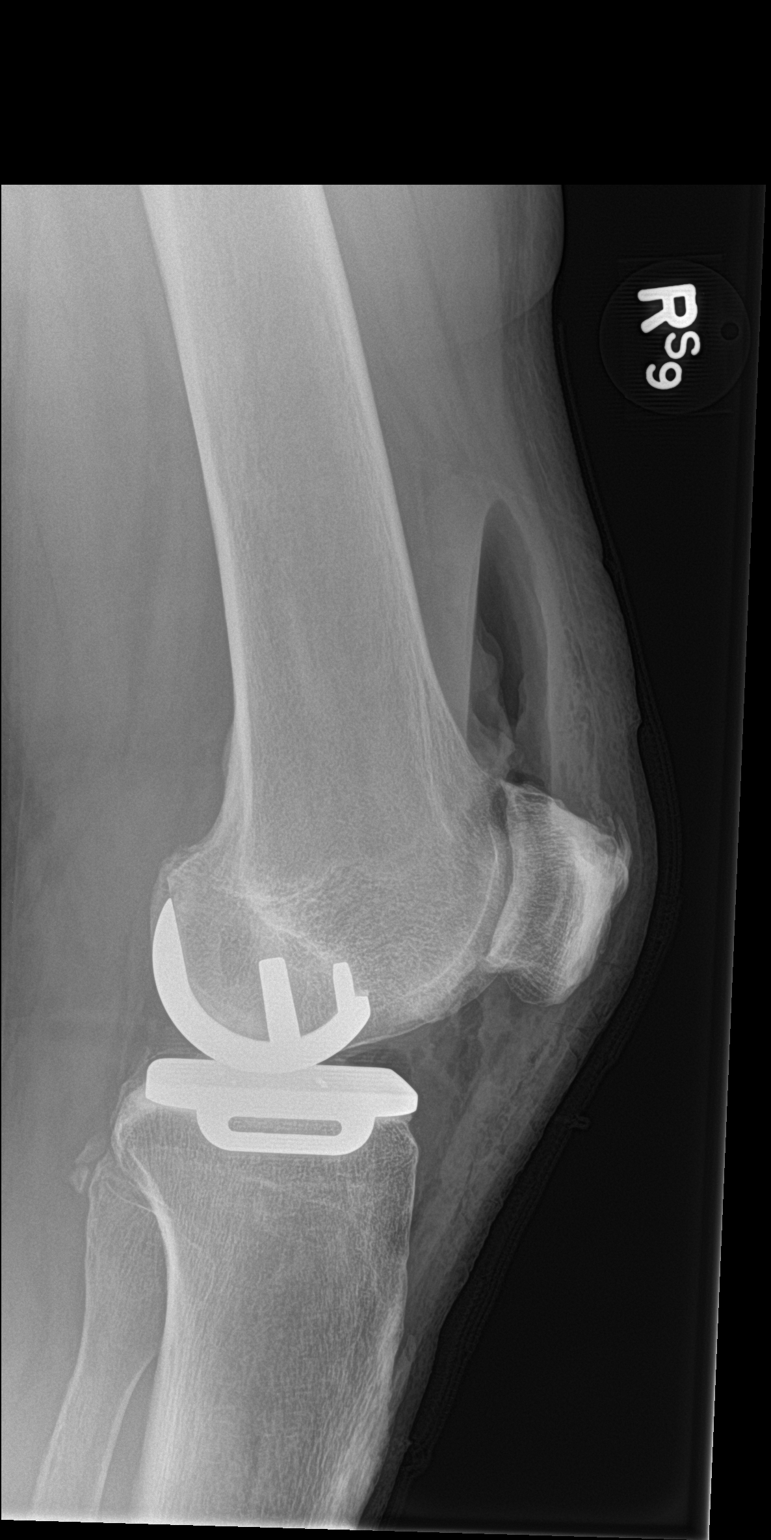

[2 of 2 positions shown; findings below may reference images not displayed]

FINDINGS: Medial hemiarthroplasty right knee. Hardware intact. Good anatomic
alignment. No acute bony abnormality .
IMPRESSION: Postsurgical changes right knee. Patient status post medial
compartment hemiarthroplasty. Hardware intact. Good anatomic
alignment. No acute bony abnormality.

## 2016-07-22 SURGERY — ARTHROPLASTY, KNEE, UNICOMPARTMENTAL
Anesthesia: Spinal | Laterality: Right

## 2016-07-22 MED ORDER — PROPOFOL 10 MG/ML IV BOLUS
INTRAVENOUS | Status: DC | PRN
  Administered 2016-07-22: 30 mg via INTRAVENOUS

## 2016-07-22 MED ORDER — PHENOL 1.4 % MT LIQD: 1.0000 | OROMUCOSAL | Status: DC | PRN

## 2016-07-22 MED ORDER — METOCLOPRAMIDE HCL 5 MG PO TABS: 5.0000 mg | ORAL_TABLET | Freq: Three times a day (TID) | ORAL | Status: DC | PRN

## 2016-07-22 MED ORDER — PROPOFOL 10 MG/ML IV BOLUS
INTRAVENOUS | Status: AC
Start: 2016-07-22 — End: 2016-07-22
  Filled 2016-07-22: qty 20

## 2016-07-22 MED ORDER — OXYCODONE HCL 5 MG PO TABS
5.0000 mg | ORAL_TABLET | ORAL | Status: DC | PRN
  Administered 2016-07-22 (×2): 10 mg via ORAL
  Administered 2016-07-23: 5 mg via ORAL
  Administered 2016-07-23: 10 mg via ORAL
  Filled 2016-07-22: qty 2
  Filled 2016-07-22: qty 1
  Filled 2016-07-22 (×2): qty 2

## 2016-07-22 MED ORDER — DEXAMETHASONE SODIUM PHOSPHATE 10 MG/ML IJ SOLN
10.0000 mg | Freq: Once | INTRAMUSCULAR | Status: AC
  Administered 2016-07-23: 10 mg via INTRAVENOUS
  Filled 2016-07-22: qty 1

## 2016-07-22 MED ORDER — SORBITOL 70 % SOLN: 30.0000 mL | Freq: Every day | Status: DC | PRN

## 2016-07-22 MED ORDER — ACETAMINOPHEN 650 MG RE SUPP: 650.0000 mg | Freq: Four times a day (QID) | RECTAL | Status: DC | PRN

## 2016-07-22 MED ORDER — ACETAMINOPHEN 500 MG PO TABS
ORAL_TABLET | ORAL | Status: AC
  Filled 2016-07-22: qty 2

## 2016-07-22 MED ORDER — BUPIVACAINE IN DEXTROSE 0.75-8.25 % IT SOLN
INTRATHECAL | Status: DC | PRN
  Administered 2016-07-22: 2 mL via INTRATHECAL

## 2016-07-22 MED ORDER — METHOCARBAMOL 500 MG PO TABS
500.0000 mg | ORAL_TABLET | Freq: Four times a day (QID) | ORAL | Status: DC | PRN
  Administered 2016-07-22 – 2016-07-23 (×2): 500 mg via ORAL
  Filled 2016-07-22 (×2): qty 1

## 2016-07-22 MED ORDER — GLYCOPYRROLATE 0.2 MG/ML IJ SOLN
INTRAMUSCULAR | Status: DC | PRN
  Administered 2016-07-22: 0.1 mg via INTRAVENOUS

## 2016-07-22 MED ORDER — METOCLOPRAMIDE HCL 5 MG/ML IJ SOLN: 5.0000 mg | Freq: Three times a day (TID) | INTRAMUSCULAR | Status: DC | PRN

## 2016-07-22 MED ORDER — MORPHINE SULFATE (PF) 2 MG/ML IV SOLN
2.0000 mg | INTRAVENOUS | Status: DC | PRN
  Administered 2016-07-22 – 2016-07-23 (×2): 2 mg via INTRAVENOUS
  Filled 2016-07-22 (×2): qty 1

## 2016-07-22 MED ORDER — METOPROLOL TARTARATE 1 MG/ML SYRINGE (5ML)
Status: DC | PRN
  Administered 2016-07-22: 1 mg via INTRAVENOUS

## 2016-07-22 MED ORDER — BUPIVACAINE HCL (PF) 0.25 % IJ SOLN
INTRAMUSCULAR | Status: AC
  Filled 2016-07-22: qty 30

## 2016-07-22 MED ORDER — FENTANYL CITRATE (PF) 100 MCG/2ML IJ SOLN
INTRAMUSCULAR | Status: AC
  Filled 2016-07-22: qty 2

## 2016-07-22 MED ORDER — PROPOFOL 500 MG/50ML IV EMUL
INTRAVENOUS | Status: DC | PRN
  Administered 2016-07-22: 25 ug/kg/min via INTRAVENOUS

## 2016-07-22 MED ORDER — ONDANSETRON HCL 4 MG PO TABS: 4.0000 mg | ORAL_TABLET | Freq: Four times a day (QID) | ORAL | Status: DC | PRN

## 2016-07-22 MED ORDER — ACETAMINOPHEN 325 MG PO TABS
650.0000 mg | ORAL_TABLET | Freq: Four times a day (QID) | ORAL | Status: DC
  Administered 2016-07-22 – 2016-07-23 (×3): 650 mg via ORAL
  Filled 2016-07-22 (×3): qty 2

## 2016-07-22 MED ORDER — ONDANSETRON HCL 4 MG/2ML IJ SOLN
INTRAMUSCULAR | Status: DC | PRN
  Administered 2016-07-22: 4 mg via INTRAVENOUS

## 2016-07-22 MED ORDER — DEXTROSE 5 % IV SOLN
INTRAVENOUS | Status: DC | PRN
  Administered 2016-07-22: 25 ug/min via INTRAVENOUS

## 2016-07-22 MED ORDER — MIDAZOLAM HCL 2 MG/2ML IJ SOLN
INTRAMUSCULAR | Status: DC | PRN
  Administered 2016-07-22 (×2): 1 mg via INTRAVENOUS

## 2016-07-22 MED ORDER — POLYETHYLENE GLYCOL 3350 17 G PO PACK
17.0000 g | PACK | Freq: Every day | ORAL | Status: DC | PRN
Start: 2016-07-22 — End: 2016-07-23

## 2016-07-22 MED ORDER — ASPIRIN EC 325 MG PO TBEC: 325.0000 mg | DELAYED_RELEASE_TABLET | Freq: Every day | ORAL | 0 refills | Status: DC

## 2016-07-22 MED ORDER — DOCUSATE SODIUM 100 MG PO CAPS: 100.0000 mg | ORAL_CAPSULE | Freq: Two times a day (BID) | ORAL | 0 refills | Status: DC

## 2016-07-22 MED ORDER — ACETAMINOPHEN 325 MG PO TABS: 650.0000 mg | ORAL_TABLET | Freq: Four times a day (QID) | ORAL | Status: DC | PRN

## 2016-07-22 MED ORDER — DOCUSATE SODIUM 100 MG PO CAPS
100.0000 mg | ORAL_CAPSULE | Freq: Two times a day (BID) | ORAL | Status: DC
  Administered 2016-07-22 – 2016-07-23 (×2): 100 mg via ORAL
  Filled 2016-07-22 (×2): qty 1

## 2016-07-22 MED ORDER — METOCLOPRAMIDE HCL 5 MG/ML IJ SOLN
10.0000 mg | Freq: Once | INTRAMUSCULAR | Status: DC | PRN
Start: 2016-07-22 — End: 2016-07-22

## 2016-07-22 MED ORDER — DIPHENHYDRAMINE HCL 12.5 MG/5ML PO ELIX: 12.5000 mg | ORAL_SOLUTION | ORAL | Status: DC | PRN

## 2016-07-22 MED ORDER — SODIUM CHLORIDE 0.9 % IR SOLN
Status: DC | PRN
  Administered 2016-07-22: 3000 mL

## 2016-07-22 MED ORDER — MENTHOL 3 MG MT LOZG: 1.0000 | LOZENGE | OROMUCOSAL | Status: DC | PRN

## 2016-07-22 MED ORDER — LACTATED RINGERS IV SOLN
INTRAVENOUS | Status: DC | PRN
  Administered 2016-07-22 (×2): via INTRAVENOUS

## 2016-07-22 MED ORDER — HYDROMORPHONE HCL 2 MG/ML IJ SOLN
INTRAMUSCULAR | Status: AC
  Administered 2016-07-22 (×4): 0.5 mg
  Filled 2016-07-22: qty 1

## 2016-07-22 MED ORDER — LIDOCAINE 2% (20 MG/ML) 5 ML SYRINGE
INTRAMUSCULAR | Status: AC
  Filled 2016-07-22: qty 5

## 2016-07-22 MED ORDER — ACETAMINOPHEN 500 MG PO TABS
1000.0000 mg | ORAL_TABLET | Freq: Once | ORAL | Status: AC
  Administered 2016-07-22: 1000 mg via ORAL
  Filled 2016-07-22: qty 2

## 2016-07-22 MED ORDER — CEFAZOLIN SODIUM-DEXTROSE 2-4 GM/100ML-% IV SOLN
INTRAVENOUS | Status: AC
  Filled 2016-07-22: qty 100

## 2016-07-22 MED ORDER — BUPIVACAINE-EPINEPHRINE (PF) 0.5% -1:200000 IJ SOLN
INTRAMUSCULAR | Status: DC | PRN
  Administered 2016-07-22: 30 mL via PERINEURAL

## 2016-07-22 MED ORDER — OXYCODONE-ACETAMINOPHEN 5-325 MG PO TABS: 1.0000 | ORAL_TABLET | ORAL | 0 refills | Status: DC | PRN

## 2016-07-22 MED ORDER — CHLORHEXIDINE GLUCONATE 4 % EX LIQD: 60.0000 mL | Freq: Once | CUTANEOUS | Status: DC

## 2016-07-22 MED ORDER — ONDANSETRON HCL 4 MG PO TABS: 4.0000 mg | ORAL_TABLET | Freq: Three times a day (TID) | ORAL | 0 refills | Status: DC | PRN

## 2016-07-22 MED ORDER — ONDANSETRON HCL 4 MG/2ML IJ SOLN
4.0000 mg | Freq: Four times a day (QID) | INTRAMUSCULAR | Status: DC | PRN
  Administered 2016-07-22: 4 mg via INTRAVENOUS
  Filled 2016-07-22: qty 2

## 2016-07-22 MED ORDER — METHOCARBAMOL 500 MG PO TABS: 500.0000 mg | ORAL_TABLET | Freq: Four times a day (QID) | ORAL | 0 refills | Status: DC | PRN

## 2016-07-22 MED ORDER — ONDANSETRON HCL 4 MG/2ML IJ SOLN
INTRAMUSCULAR | Status: AC
Start: 2016-07-22 — End: 2016-07-22
  Filled 2016-07-22: qty 2

## 2016-07-22 MED ORDER — TRANEXAMIC ACID 1000 MG/10ML IV SOLN
1000.0000 mg | INTRAVENOUS | Status: AC
  Administered 2016-07-22: 1000 mg via INTRAVENOUS
  Filled 2016-07-22 (×2): qty 10

## 2016-07-22 MED ORDER — CEFAZOLIN SODIUM-DEXTROSE 2-4 GM/100ML-% IV SOLN
2.0000 g | INTRAVENOUS | Status: AC
  Administered 2016-07-22: 2 g via INTRAVENOUS

## 2016-07-22 MED ORDER — MIDAZOLAM HCL 2 MG/2ML IJ SOLN
INTRAMUSCULAR | Status: AC
  Filled 2016-07-22: qty 2

## 2016-07-22 MED ORDER — ATORVASTATIN CALCIUM 10 MG PO TABS: 10.0000 mg | ORAL_TABLET | Freq: Every day | ORAL | Status: DC

## 2016-07-22 MED ORDER — LIDOCAINE HCL (CARDIAC) 20 MG/ML IV SOLN
INTRAVENOUS | Status: DC | PRN
  Administered 2016-07-22: 60 mg via INTRATRACHEAL

## 2016-07-22 MED ORDER — METOPROLOL TARTRATE 5 MG/5ML IV SOLN
INTRAVENOUS | Status: AC
  Filled 2016-07-22: qty 5

## 2016-07-22 MED ORDER — PROPOFOL 1000 MG/100ML IV EMUL
INTRAVENOUS | Status: AC
Start: 2016-07-22 — End: 2016-07-22
  Filled 2016-07-22: qty 100

## 2016-07-22 MED ORDER — DEXTROSE-NACL 5-0.45 % IV SOLN
INTRAVENOUS | Status: AC
  Administered 2016-07-22: 22:00:00 via INTRAVENOUS
  Filled 2016-07-22: qty 1000

## 2016-07-22 MED ORDER — CEFAZOLIN SODIUM-DEXTROSE 2-4 GM/100ML-% IV SOLN
2.0000 g | Freq: Four times a day (QID) | INTRAVENOUS | Status: AC
  Administered 2016-07-22 – 2016-07-23 (×2): 2 g via INTRAVENOUS
  Filled 2016-07-22 (×2): qty 100

## 2016-07-22 MED ORDER — METHOCARBAMOL 1000 MG/10ML IJ SOLN
500.0000 mg | Freq: Four times a day (QID) | INTRAVENOUS | Status: DC | PRN
  Filled 2016-07-22: qty 5

## 2016-07-22 MED ORDER — HYDROMORPHONE HCL 1 MG/ML IJ SOLN: 0.2500 mg | INTRAMUSCULAR | Status: DC | PRN

## 2016-07-22 MED ORDER — KETAMINE HCL 10 MG/ML IJ SOLN
INTRAMUSCULAR | Status: DC | PRN
  Administered 2016-07-22 (×2): 20 mg via INTRAVENOUS

## 2016-07-22 MED ORDER — 0.9 % SODIUM CHLORIDE (POUR BTL) OPTIME
TOPICAL | Status: DC | PRN
  Administered 2016-07-22: 1000 mL

## 2016-07-22 MED ORDER — SENNA 8.6 MG PO TABS
1.0000 | ORAL_TABLET | Freq: Two times a day (BID) | ORAL | Status: DC
  Administered 2016-07-22 – 2016-07-23 (×2): 8.6 mg via ORAL
  Filled 2016-07-22 (×2): qty 1

## 2016-07-22 MED ORDER — MEPERIDINE HCL 25 MG/ML IJ SOLN: 6.2500 mg | INTRAMUSCULAR | Status: DC | PRN

## 2016-07-22 MED ORDER — FENTANYL CITRATE (PF) 100 MCG/2ML IJ SOLN
INTRAMUSCULAR | Status: DC | PRN
  Administered 2016-07-22: 50 ug via INTRAVENOUS

## 2016-07-22 MED ORDER — ASPIRIN EC 325 MG PO TBEC
325.0000 mg | DELAYED_RELEASE_TABLET | Freq: Every day | ORAL | Status: DC
  Administered 2016-07-23: 325 mg via ORAL
  Filled 2016-07-22: qty 1

## 2016-07-22 MED ORDER — GLYCOPYRROLATE 0.2 MG/ML IV SOSY
PREFILLED_SYRINGE | INTRAVENOUS | Status: AC
  Filled 2016-07-22: qty 3

## 2016-07-22 MED ORDER — CEFAZOLIN SODIUM 10 G IJ SOLR: 3.0000 g | INTRAMUSCULAR | Status: DC

## 2016-07-22 MED ORDER — KETAMINE HCL-SODIUM CHLORIDE 100-0.9 MG/10ML-% IV SOSY
PREFILLED_SYRINGE | INTRAVENOUS | Status: AC
Start: 2016-07-22 — End: 2016-07-22
  Filled 2016-07-22: qty 10

## 2016-07-22 SURGICAL SUPPLY — 55 items
BANDAGE ESMARK 6X9 LF (GAUZE/BANDAGES/DRESSINGS) ×1 IMPLANT
BIT DRILL QUICK REL 1/8 2PK SL (DRILL) IMPLANT
BNDG CMPR 9X6 STRL LF SNTH (GAUZE/BANDAGES/DRESSINGS) ×1
BNDG ESMARK 6X9 LF (GAUZE/BANDAGES/DRESSINGS) ×2
BONE CEMENT PALACOSE (Orthopedic Implant) ×2 IMPLANT
BOWL SMART MIX CTS (DISPOSABLE) ×2 IMPLANT
CAPT KNEE PARTIAL 2 ×1 IMPLANT
CEMENT BONE PALACOSE (Orthopedic Implant) ×1 IMPLANT
CHLORAPREP W/TINT 26ML (MISCELLANEOUS) ×2 IMPLANT
CLSR STERI-STRIP ANTIMIC 1/2X4 (GAUZE/BANDAGES/DRESSINGS) ×2 IMPLANT
COVER SURGICAL LIGHT HANDLE (MISCELLANEOUS) ×2 IMPLANT
CUFF TOURNIQUET SINGLE 34IN LL (TOURNIQUET CUFF) ×2 IMPLANT
DRAPE ARTHROSCOPY W/POUCH 114 (DRAPES) ×2 IMPLANT
DRAPE IMP U-DRAPE 54X76 (DRAPES) ×4 IMPLANT
DRAPE PROXIMA HALF (DRAPES) ×2 IMPLANT
DRAPE U-SHAPE 47X51 STRL (DRAPES) ×2 IMPLANT
DRILL QUICK RELEASE 1/8 INCH (DRILL) ×1
DRSG MEPILEX BORDER 4X4 (GAUZE/BANDAGES/DRESSINGS) IMPLANT
DRSG MEPILEX BORDER 4X8 (GAUZE/BANDAGES/DRESSINGS) ×2 IMPLANT
ELECT CAUTERY BLADE 6.4 (BLADE) ×2 IMPLANT
ELECT REM PT RETURN 9FT ADLT (ELECTROSURGICAL) ×2
ELECTRODE REM PT RTRN 9FT ADLT (ELECTROSURGICAL) ×1 IMPLANT
EVACUATOR 1/8 PVC DRAIN (DRAIN) IMPLANT
FACESHIELD WRAPAROUND (MASK) ×4 IMPLANT
FACESHIELD WRAPAROUND OR TEAM (MASK) ×2 IMPLANT
GLOVE BIO SURGEON STRL SZ7.5 (GLOVE) ×4 IMPLANT
GLOVE BIOGEL PI IND STRL 8 (GLOVE) ×2 IMPLANT
GLOVE BIOGEL PI INDICATOR 8 (GLOVE) ×2
GOWN STRL REUS W/ TWL LRG LVL3 (GOWN DISPOSABLE) ×2 IMPLANT
GOWN STRL REUS W/ TWL XL LVL3 (GOWN DISPOSABLE) ×1 IMPLANT
GOWN STRL REUS W/TWL LRG LVL3 (GOWN DISPOSABLE) ×4
GOWN STRL REUS W/TWL XL LVL3 (GOWN DISPOSABLE) ×2
HANDPIECE INTERPULSE COAX TIP (DISPOSABLE) ×2
IMMOBILIZER KNEE 22 UNIV (SOFTGOODS) ×2 IMPLANT
KIT BASIN OR (CUSTOM PROCEDURE TRAY) ×2 IMPLANT
KIT ROOM TURNOVER OR (KITS) ×2 IMPLANT
MANIFOLD NEPTUNE II (INSTRUMENTS) ×2 IMPLANT
NS IRRIG 1000ML POUR BTL (IV SOLUTION) ×2 IMPLANT
PACK BLADE SAW RECIP 70 3 PT (BLADE) ×1 IMPLANT
PACK TOTAL JOINT (CUSTOM PROCEDURE TRAY) ×2 IMPLANT
PACK UNIVERSAL I (CUSTOM PROCEDURE TRAY) ×2 IMPLANT
PAD ARMBOARD 7.5X6 YLW CONV (MISCELLANEOUS) ×2 IMPLANT
SET HNDPC FAN SPRY TIP SCT (DISPOSABLE) ×1 IMPLANT
STAPLER VISISTAT 35W (STAPLE) IMPLANT
SUCTION FRAZIER HANDLE 10FR (MISCELLANEOUS) ×1
SUCTION TUBE FRAZIER 10FR DISP (MISCELLANEOUS) ×1 IMPLANT
SUT MNCRL AB 4-0 PS2 18 (SUTURE) IMPLANT
SUT MON AB 2-0 CT1 27 (SUTURE) ×4 IMPLANT
SUT MON AB 2-0 CT1 36 (SUTURE) ×1 IMPLANT
SUT VIC AB 0 CT1 27 (SUTURE) ×2
SUT VIC AB 0 CT1 27XBRD ANTBC (SUTURE) IMPLANT
SUT VIC AB 1 CTX 36 (SUTURE) ×4
SUT VIC AB 1 CTX36XBRD ANBCTR (SUTURE) ×2 IMPLANT
TOWEL OR 17X24 6PK STRL BLUE (TOWEL DISPOSABLE) ×2 IMPLANT
TOWEL OR 17X26 10 PK STRL BLUE (TOWEL DISPOSABLE) ×2 IMPLANT

## 2016-07-22 NOTE — Transfer of Care (Signed)
Immediate Anesthesia Transfer of Care Note  Patient: Jimmy Rice  Procedure(s) Performed: Procedure(s): UNICOMPARTMENTAL KNEE ARTHROPLASTY (Right)  Patient Location: PACU  Anesthesia Type:Spinal  Level of Consciousness: awake, alert , oriented and patient cooperative  Airway & Oxygen Therapy: Patient Spontanous Breathing and Patient connected to nasal cannula oxygen  Post-op Assessment: Report given to RN, Post -op Vital signs reviewed and stable and Patient able to stick tongue midline  Post vital signs: Reviewed and stable  Last Vitals:  Vitals:   07/22/16 0700 07/22/16 0934  BP: (!) 148/88   Pulse: 69   Resp: (!) 21   Temp: 36.4 C (!) (P) 36 C    Last Pain:  Vitals:   07/22/16 0700  TempSrc: Oral         Complications: No apparent anesthesia complications

## 2016-07-22 NOTE — Progress Notes (Signed)
Orthopedic Tech Progress Note Patient Details:  Jimmy Rice 12/31/1945 [id number]  CPM Right Knee CPM Right Knee: On Right Knee Flexion (Degrees): 90 Right Knee Extension (Degrees): 0 Additional Comments: Trapeze bar and foot roll   [place] Pink 07/22/2016, 10:55 AM

## 2016-07-22 NOTE — Anesthesia Postprocedure Evaluation (Signed)
Anesthesia Post Note  Patient: [name]  Procedure(s) Performed: Procedure(s) (LRB): UNICOMPARTMENTAL KNEE ARTHROPLASTY (Right)  Patient location during evaluation: PACU Anesthesia Type: Spinal Level of consciousness: oriented and awake and alert Pain management: pain level controlled Vital Signs Assessment: post-procedure vital signs reviewed and stable Respiratory status: spontaneous breathing, respiratory function stable and nonlabored ventilation Cardiovascular status: blood pressure returned to baseline and stable Postop Assessment: no headache, no backache, spinal receding and no signs of nausea or vomiting Anesthetic complications: no    Last Vitals:  Vitals:   07/22/16 0934 07/22/16 1030  BP: 118/87 (!) 147/93  Pulse:  71  Resp: 12 13  Temp: (!) 36 C     Last Pain:  Vitals:   07/22/16 0700  TempSrc: Oral          RLE Sensation: No sensation (absent) (07/22/16 1101) L Sensory Level: L1-Inguinal (groin) region (07/22/16 1101) R Sensory Level: L1-Inguinal (groin) region (07/22/16 1101)  Aundreya Souffrant A.

## 2016-07-22 NOTE — Interval H&P Note (Signed)
History and Physical Interval Note:  07/22/2016 7:30 AM  Jimmy Rice  has presented today for surgery, with the diagnosis of OA RIGHT KNEE  The various methods of treatment have been discussed with the patient and family. After consideration of risks, benefits and other options for treatment, the patient has consented to  Procedure(s): UNICOMPARTMENTAL KNEE ARTHROPLASTY (Right) as a surgical intervention .  The patient's history has been reviewed, patient examined, no change in status, stable for surgery.  I have reviewed the patient's chart and labs.  Questions were answered to the patient's satisfaction.     Karon Heckendorn D

## 2016-07-22 NOTE — Anesthesia Procedure Notes (Signed)
Spinal  Patient location during procedure: OR Start time: 07/22/2016 7:36 AM Staffing Anesthesiologist: Josephine Igo Performed: anesthesiologist  Preanesthetic Checklist Completed: patient identified, site marked, surgical consent, pre-op evaluation, timeout performed, IV checked, risks and benefits discussed and monitors and equipment checked Spinal Block Patient position: sitting Prep: site prepped and draped and DuraPrep Patient monitoring: heart rate, cardiac monitor, continuous pulse ox and blood pressure Approach: midline Location: L3-4 Injection technique: single-shot Needle Needle type: Sprotte  Needle gauge: 24 G Needle length: 9 cm Needle insertion depth: 6 cm Assessment Sensory level: T6 Additional Notes Patient tolerated procedure well. Adequate sensory level.

## 2016-07-22 NOTE — Discharge Instructions (Signed)

## 2016-07-22 NOTE — Anesthesia Procedure Notes (Addendum)
Anesthesia Regional Block:  Adductor canal block  Pre-Anesthetic Checklist: ,, timeout performed, Correct Patient, Correct Site, Correct Laterality, Correct Procedure, Correct Position, site marked, Risks and benefits discussed,  Surgical consent,  Pre-op evaluation,  At surgeon's request and post-op pain management  Laterality: Right  Prep: chloraprep       Needles:   Needle Type: Echogenic Stimulator Needle     Needle Length: 9cm 9 cm Needle Gauge: 21 and 21 G  Needle insertion depth: 4.5 cm   Additional Needles:  Procedures: ultrasound guided (picture in chart) Adductor canal block Narrative:  Injection made incrementally with aspirations every 5 mL.  Performed by: Personally  Anesthesiologist: Josephine Igo  Additional Notes: Relevant anatomy ID'd with [place]. Incremental 21ml injection with frequent aspiration. Patient tolerated procedure well.

## 2016-07-22 NOTE — Op Note (Signed)
07/22/2016  9:13 AM  PATIENT:  Jimmy Rice    PRE-OPERATIVE DIAGNOSIS:  OA RIGHT KNEE  POST-OPERATIVE DIAGNOSIS:  Same  PROCEDURE:  UNICOMPARTMENTAL KNEE ARTHROPLASTY  SURGEON:  Gelena Klosinski, Ernesta Amble, MD  PHYSICIAN ASSISTANT: Roxan Hockey, PA-C, he was present and scrubbed throughout the case, critical for completion in a timely fashion, and for retraction, instrumentation, and closure.   ANESTHESIA:   General  PREOPERATIVE INDICATIONS:  Jimmy Rice is a  70 y.o. male with a diagnosis of OA RIGHT KNEE who failed conservative measures and elected for surgical management.    The risks benefits and alternatives were discussed with the patient preoperatively including but not limited to the risks of infection, bleeding, nerve injury, cardiopulmonary complications, blood clots, the need for revision surgery, among others, and the patient was willing to proceed.  OPERATIVE IMPLANTS: Biomet Oxford mobile bearing medial compartment arthroplasty. Femoral Component: medium. Tibial tray: D, Size 4 poly.   OPERATIVE FINDINGS: Endstage grade 4 medial compartment osteoarthritis. No significant changes in the lateral or patellofemoral joint  OPERATIVE PROCEDURE: The patient was brought to the operating room placed in supine position. General anesthesia was administered. IV antibiotics were given. The lower extremity was placed in the legholder and prepped and draped in usual sterile fashion.  Time out was performed.  The leg was elevated and exsanguinated and the tourniquet was inflated. Anteromedial incision was performed, and I took care to preserve the MCL. Parapatellar incision was carried out, and the osteophytes were excised, along with the medial meniscus and a small portion of the fat pad.  The extra medullary tibial cutting jig was applied, using the spoon and the 56mm G-Clamp, and I took care to protect the anterior cruciate ligament insertion and the tibial spine. The medial  collateral ligament was also protected, and I resected my proximal tibia, matching the anatomic slope.   The proximal tibial bony cut was removed in one piece, and I turned my attention to the femur.  The intramedullary femoral rod was placed using the drill, and then using the appropriate reference, I assembled the femoral jig, setting my posterior cutting block. I resected my posterior femur, and then measured my gap.   I then used the mill to match the extension gap to the flexion gap. The gaps were then measured again with the appropriate feeler gauges. Once I had balanced flexion and extension gaps, I then completed the preparation of the femur.  I milled off the anterior aspect of the distal femur to prevent impingement. I also exposed the tibia, and selected the above-named component, and then used the cutting jig to prepare the keel slot on the tibia. I also used the awl to curette out the bone to complete the preparation of the keel. The back wall was intact.  I then placed trial components, and it was found to have excellent motion, and appropriate balance.  I then cemented the components into place, cementing the tibia first, removing all excess cement, and then cementing the femur.  All loose cement was removed.  The real polyethylene insert was applied manually, and the knee was taken through functional range of motion, and found to have excellent stability and restoration of joint motion, with excellent balance.  The wounds were irrigated copiously, and the parapatellar tissue closed with Vicryl, followed by Vicryl for the subcutaneous tissue, with routine closure with Steri-Strips and sterile gauze.  The tourniquet was released, and the patient was awakened and extubated and returned to PACU  in stable and satisfactory condition. There were no complications.  POSTOPERATIVE PLAN: DVT px will consist of SCD's and ASA 325, WBAT     Renette Butters, MD

## 2016-07-23 ENCOUNTER — Encounter (HOSPITAL_COMMUNITY): Payer: Self-pay

## 2016-07-23 DIAGNOSIS — M1711 Unilateral primary osteoarthritis, right knee: Secondary | ICD-10-CM | POA: Diagnosis not present

## 2016-07-23 LAB — CBC
HEMATOCRIT: 43.9 % (ref 39.0–52.0)
HEMOGLOBIN: 14.5 g/dL (ref 13.0–17.0)
MCH: 29.5 pg (ref 26.0–34.0)
MCHC: 33 g/dL (ref 30.0–36.0)
MCV: 89.4 fL (ref 78.0–100.0)
Platelets: 163 10*3/uL (ref 150–400)
RBC: 4.91 MIL/uL (ref 4.22–5.81)
RDW: 12.7 % (ref 11.5–15.5)
WBC: 9.1 10*3/uL (ref 4.0–10.5)

## 2016-07-23 NOTE — Evaluation (Signed)
Occupational Therapy Evaluation and Discharge Patient Details Name: Jimmy Rice MRN: [id number] DOB: November 17, 1945 Today's Date: 07/23/2016    History of Present Illness 70 y/o male s/p right unicompartmental knee arthroplasty. Pt  has a past medical history of Arthritis; Chronic kidney disease (CKD), stage III (moderate); Complication of anesthesia; GERD (01/18/2009); HYPERTENSION, and HYPOGONADISM.   Clinical Impression   PTA Pt independent in ADL and mobility. Pt currently min A for LB ADL, and min guard for mobility with RW. Pt pleasant and eager for all education, and to work with therapy. Pt at adequate level for dc and OT answered all questions and concerns for d/c home with girlfriend available 24 hours. No further OT needs at this time.     Follow Up Recommendations  No OT follow up;Supervision - Intermittent    Equipment Recommendations  Other (comment) (Pt reports that he already had 3 in 1 delivered to apt)    Recommendations for Other Services       Precautions / Restrictions Precautions Precautions: Fall Required Braces or Orthoses: Knee Immobilizer - Right (in room, no orders, Pt able to lift RLE to get out of CPM) Knee Immobilizer - Right: Other (comment) (in room, but not used during therapy - no orders) Restrictions Weight Bearing Restrictions: Yes RLE Weight Bearing: Weight bearing as tolerated      Mobility Bed Mobility Overal bed mobility: Needs Assistance Bed Mobility: Supine to Sit     Supine to sit: Min guard (for safety (1st time OOB))     General bed mobility comments: "I am so glad to be out of this bed"   Transfers Overall transfer level: Needs assistance Equipment used: Rolling walker (2 wheeled) Transfers: Sit to/from Stand Sit to Stand: Min guard         General transfer comment: vc for safe hand placement initially, and pain increase with sit to stand from 3 to 5    Balance Overall balance assessment: Needs  assistance Sitting-balance support: No upper extremity supported;Feet supported Sitting balance-Leahy Scale: Good     Standing balance support: No upper extremity supported;During functional activity;Single extremity supported Standing balance-Leahy Scale: Fair Standing balance comment: Pt dependent on BUE during ambulation, Pt able to perform sink level ADL with varied support from UE.                            ADL Overall ADL's : Needs assistance/impaired Eating/Feeding: Modified independent;Sitting   Grooming: Wash/dry hands;Oral care;Standing Grooming Details (indicate cue type and reason): sink level  Upper Body Bathing: Standing;Min guard Upper Body Bathing Details (indicate cue type and reason): sponge bath at sink Lower Body Bathing: Min guard;Sit to/from stand Lower Body Bathing Details (indicate cue type and reason): sponge bath at sink for private parts, able to reach down to feet sitting EOB Upper Body Dressing : Modified independent;Sitting Upper Body Dressing Details (indicate cue type and reason): hospital gown Lower Body Dressing: Minimal assistance;With caregiver independent assisting;Sit to/from stand Lower Body Dressing Details (indicate cue type and reason): able to reach down to feet to adjust socks, but not enough ROM to don/doff. Per Pt "my girlfriend will help me" Toilet Transfer: Min guard;Ambulation;RW;BSC (3 in 1 over toilet)   Toileting- Clothing Manipulation and Hygiene: Min guard;Sit to/from stand   Tub/ Shower Transfer: Walk-in shower;Min guard;Ambulation;Rolling walker;3 in 1 Tub/Shower Transfer Details (indicate cue type and reason): educated on use of 3 in 1 as shower chair Functional mobility  during ADLs: Min guard;Rolling walker General ADL Comments: Pt motivated     Estate agent      Pertinent Vitals/Pain Pain Assessment: 0-10 Pain Score: 2  Pain Location: R knee Pain Descriptors / Indicators: Sore      Hand Dominance Right   Extremity/Trunk Assessment Upper Extremity Assessment Upper Extremity Assessment: Overall WFL for tasks assessed   Lower Extremity Assessment Lower Extremity Assessment: RLE deficits/detail RLE Deficits / Details: ROM and strength deficits post-op   Cervical / Trunk Assessment Cervical / Trunk Assessment: Normal   Communication Communication Communication: No difficulties   Cognition Arousal/Alertness: Awake/alert Behavior During Therapy: WFL for tasks assessed/performed Overall Cognitive Status: Within Functional Limits for tasks assessed                     General Comments       Exercises       Shoulder Instructions      Home Living Family/patient expects to be discharged to:: Private residence Living Arrangements: Spouse/significant other Jimmy Rice Girlfriend) Available Help at Discharge: Family;Available 24 hours/day Type of Home: Apartment Home Access: Elevator     Home Layout: One level     Bathroom Shower/Tub: Occupational psychologist: Standard Bathroom Accessibility: Yes How Accessible: Accessible via walker Home Equipment: Walker - 2 wheels;Bedside commode          Prior Functioning/Environment Level of Independence: Independent                 OT Problem List: Decreased range of motion;Decreased strength;Decreased knowledge of use of DME or AE   OT Treatment/Interventions:      OT Goals(Current goals can be found in the care plan section) Acute Rehab OT Goals Patient Stated Goal: To get home today OT Goal Formulation: With patient Time For Goal Achievement: 07/30/16 Potential to Achieve Goals: Good  OT Frequency:     Barriers to D/C:            Co-evaluation              End of Session Equipment Utilized During Treatment: Gait belt;Rolling walker CPM Right Knee CPM Right Knee: Other (Comment) (took off at beginning of session, Pt had been in for 2 hours) Nurse Communication:  Patient requests pain meds (requested at beginning of session.)  Activity Tolerance: Patient tolerated treatment well Patient left: Other (comment) (In hall with physical therapy)   Time: 3903-0092 OT Time Calculation (min): 35 min Charges:  OT General Charges $OT Visit: 1 Procedure OT Evaluation $OT Eval Low Complexity: 1 Procedure OT Treatments $Self Care/Home Management : 8-22 mins G-Codes: OT G-codes **NOT FOR INPATIENT CLASS** Functional Assessment Tool Used: Clinical Judgement Functional Limitation: Self care Self Care Current Status (Z3007): At least 20 percent but less than 40 percent impaired, limited or restricted Self Care Goal Status (M2263): At least 1 percent but less than 20 percent impaired, limited or restricted Self Care Discharge Status [phone]): At least 20 percent but less than 40 percent impaired, limited or restricted  Jaci Carrel 07/23/2016, 10:12 AM Hulda Humphrey OTR/L [phone]

## 2016-07-23 NOTE — Progress Notes (Signed)
Orthopedic Tech Progress Note Patient Details:  Jimmy Rice [birth year] [id number]  Patient ID: [name], male   DOB: 12/16/1945, 70 y.o.   MRN: [id number] Applied cpm 0-60  Karolee Stamps 07/23/2016, 6:05 AM

## 2016-07-23 NOTE — Care Management Note (Signed)
Case Management Note  Patient Details  Name: Jimmy Rice MRN: [id number] Date of Birth: 1945-10-31  Subjective/Objective:    70 yr old gentleman s/p right unicompartmental knee.                Action/Plan: Case manager spoke with patient concerning [place] and DME needs at discharge. Patient was preoperatively setup with [facility], no changes. Patient has rolling walker and 3in1. He will be going to a friend's home to recover: [place], [place], [place]. Patient's cell# is [phone].  Expected Discharge Date:    07/23/16              Expected Discharge Plan:  [place]  In-House Referral:     Discharge planning Services  CM Consult  Post Acute Care Choice:  Home Health Choice offered to:  Patient  DME Arranged:    DME Agency:  NA  HH Arranged:  PT [place] Agency:  [facility] (now [facility])  Status of Service:  Completed, signed off  If discussed at [employer] of Stay Meetings, dates discussed:    Additional Comments:  Ninfa Meeker, RN 07/23/2016, 10:34 AM

## 2016-07-23 NOTE — Care Management Obs Status (Signed)
[facility] NOTIFICATION   Patient Details  Name: Jimmy Rice MRN: [id number] Date of Birth: [birth year]   Medicare Observation Status Notification Given:  Yes    Ninfa Meeker, RN 07/23/2016, 11:12 AM

## 2016-07-23 NOTE — Evaluation (Signed)
Physical Therapy Evaluation Patient Details Name: Jimmy Rice MRN: [id number] DOB: December 03, 1945 Today's Date: 07/23/2016   History of Present Illness  Pt is a 70 y/o male s/p R unicompartmental knee arthroplasty. PMH including but not limited to CKD and HTN.  Clinical Impression  Pt presented sitting EOB with OT when PT entered room. Prior to admission, pt was independent with all functional mobility. Pt moving well during evaluation and was able to ambulate 40' with min guard for safety and use of RW. Pt would continue to benefit from skilled physical therapy services at this time while admitted and after d/c to address his below listed limitations in order to improve his overall safety and independence with functional mobility.     Follow Up Recommendations Home health PT;Supervision for mobility/OOB    Equipment Recommendations  None recommended by PT;Other (comment) (pt reported having all necessary DME at home)    Recommendations for Other Services       Precautions / Restrictions Precautions Precautions: Fall;Knee Precaution Booklet Issued: Yes (comment) Required Braces or Orthoses: Knee Immobilizer - Right (in room, no orders, Pt able to lift RLE to get out of CPM) Knee Immobilizer - Right: Other (comment) (in room, but not used during therapy - no orders) Restrictions Weight Bearing Restrictions: Yes RLE Weight Bearing: Weight bearing as tolerated      Mobility  Bed Mobility     General bed mobility comments: Pt sitting EOB with OT when PT entered room  Transfers Overall transfer level: Needs assistance Equipment used: Rolling walker (2 wheeled) Transfers: Sit to/from Stand Sit to Stand: Min guard         General transfer comment: pt required increased time, VC'ing for bilateral hand placement and min guard for safety  Ambulation/Gait Ambulation/Gait assistance: Min guard Ambulation Distance (Feet): 40 Feet Assistive device: Rolling walker (2  wheeled) Gait Pattern/deviations: Step-to pattern;Decreased step length - left;Decreased stance time - right;Decreased weight shift to right;Trunk flexed Gait velocity: decreased Gait velocity interpretation: Below normal speed for age/gender General Gait Details: pt required VC'ing for sequencing with RW and postural corrections  Stairs            Wheelchair Mobility    Modified Rankin (Stroke Patients Only)       Balance Overall balance assessment: Needs assistance Sitting-balance support: Feet supported;No upper extremity supported Sitting balance-Leahy Scale: Fair     Rice balance support: During functional activity;No upper extremity supported Rice balance-Leahy Scale: Fair Rice balance comment: pt able to stand at sink to wash hands and face with no UE supports and min guard                             Pertinent Vitals/Pain Pain Assessment: 0-10 Pain Score: 2  Pain Location: R knee Pain Descriptors / Indicators: Sore Pain Intervention(s): Monitored during session;Repositioned;Ice applied    Home Living Family/patient expects to be discharged to:: Private residence Living Arrangements: Spouse/significant other [name] Girlfriend) Available Help at Discharge: Family;Available 24 hours/day Type of Home: Apartment Home Access: Elevator     Home Layout: One level Home Equipment: Walker - 2 wheels;Bedside commode      Prior Function Level of Independence: Independent               Hand Dominance   Dominant Hand: Right    Extremity/Trunk Assessment   Upper Extremity Assessment: Defer to OT evaluation  Lower Extremity Assessment: RLE deficits/detail RLE Deficits / Details: Pt with decreased strength and ROM limitations secondary to post-op.    Cervical / Trunk Assessment: Normal  Communication   Communication: No difficulties  Cognition Arousal/Alertness: Awake/alert Behavior During Therapy: WFL for tasks  assessed/performed Overall Cognitive Status: Within Functional Limits for tasks assessed                      General Comments      Exercises Total Joint Exercises Ankle Circles/Pumps: AROM;Right;10 reps;Seated Quad Sets: AROM;Right;10 reps;Seated Heel Slides: AROM;Right;10 reps;Seated Long Arc Quad: AAROM;Right;10 reps;Seated Knee Flexion: AAROM;Right;10 reps;Seated Goniometric ROM: Flexion = 75 degrees; Extension = lacking 15 degrees to neutral; measured in sitting   Assessment/Plan    PT Assessment Patient needs continued PT services  PT Problem List Decreased strength;Decreased range of motion;Decreased activity tolerance;Decreased balance;Decreased mobility;Decreased coordination;Decreased knowledge of use of DME;Pain          PT Treatment Interventions DME instruction;Gait training;Stair training;Functional mobility training;Therapeutic activities;Therapeutic exercise;Balance training;Neuromuscular re-education;Patient/family education    PT Goals (Current goals can be found in the Care Plan section)  Acute Rehab PT Goals Patient Stated Goal: to go home today PT Goal Formulation: With patient Time For Goal Achievement: 07/30/16 Potential to Achieve Goals: Good    Frequency 7X/week   Barriers to discharge        Co-evaluation               End of Session Equipment Utilized During Treatment: Gait belt Activity Tolerance: Patient tolerated treatment well Patient left: in chair;with call bell/phone within reach Nurse Communication: Mobility status    Functional Assessment Tool Used: clinical judgement Functional Limitation: Mobility: Walking and moving around Mobility: Walking and Moving Around Current Status (K5993): At least 1 percent but less than 20 percent impaired, limited or restricted Mobility: Walking and Moving Around Goal Status [phone]): 0 percent impaired, limited or restricted    Time: 0900-0927 PT Time Calculation (min) (ACUTE  ONLY): 27 min   Charges:   PT Evaluation $PT Eval Moderate Complexity: 1 Procedure PT Treatments $Gait Training: 8-22 mins   PT G Codes:   PT G-Codes **NOT FOR INPATIENT CLASS** Functional Assessment Tool Used: clinical judgement Functional Limitation: Mobility: Walking and moving around Mobility: Walking and Moving Around Current Status (B9390): At least 1 percent but less than 20 percent impaired, limited or restricted Mobility: Walking and Moving Around Goal Status [phone]): 0 percent impaired, limited or restricted    [facility] 07/23/2016, 10:27 AM Sherie Don, PT, DPT [phone]

## 2016-07-23 NOTE — Progress Notes (Signed)
Pt ready for discharge home from unit. All discharge information reviewed with pt. Discharge Rx reviewed with pt. All personal belongings with pt. Pt displays no s/sx of distress or c/o. Family in to transport home via pvt auto. Pt advised to make follow up physician appt and will be followed at home by [facility]

## 2016-07-23 NOTE — Discharge Summary (Signed)
Discharge Summary  Patient ID: Jimmy Rice MRN: [id number] DOB/AGE: Feb 15, 1946 70 y.o.  Admit date: 07/22/2016 Discharge date: 07/23/2016  Admission Diagnoses:  Primary osteoarthritis of right knee  Discharge Diagnoses:  Principal Problem:   Primary osteoarthritis of right knee Active Problems:   Low testosterone   Essential hypertension   Asthma   GERD   BPH (benign prostatic hyperplasia)   Chronic kidney disease  Progress  Subjective: Feeling very well.  Pain controlled.  Tolerating diet.  Urinating.  No CP, SOB.  Objective: General: NAD.  Supine in bed.  CPM in place Resp: No increased WOB Cardio: regular rate and rhythm ABD soft Neurologically intact MSK Neurovascularly intact Sensation intact distally Intact pulses distally Dorsiflexion/Plantar flexion intact Incision: dressing C/D/I  Plan: Weight Bearing: Weight Bearing as Tolerated (WBAT) Right leg Dressings: Mepilex.  VTE prophylaxis: Aspirin, ambulation, SCDs Dispo: Home today after PT evaluation / therapy.   Past Medical History:  Diagnosis Date  . Arthritis    "right knee" (07/22/2016)  . BENIGN PROSTATIC HYPERTROPHY, HX OF 04/04/2009  . Blood transfusion   . Childhood asthma   . Chronic kidney disease (CKD), stage III (moderate)    cysts on kidneys, 3rd stage kidney disease  . Complication of anesthesia    woke up "fighting"  confusion  . GERD 01/18/2009  . HYPERTENSION 01/18/2009  . HYPOGONADISM 01/18/2009    Surgeries: Procedure(s): UNICOMPARTMENTAL KNEE ARTHROPLASTY on 07/22/2016  Discharged Condition: Improved  Hospital Course: Jimmy Rice is an 70 y.o. male who was admitted 07/22/2016 with a diagnosis of Primary osteoarthritis of right knee and went to the operating room on 07/22/2016 and underwent the above named procedures.    He was given perioperative antibiotics:  Anti-infectives    Start     Dose/Rate Route Frequency Ordered Stop   07/22/16 1900  ceFAZolin (ANCEF)  IVPB 2g/100 mL premix     2 g 200 mL/hr over 30 Minutes Intravenous Every 6 hours 07/22/16 1814 07/23/16 0153   07/22/16 0531  ceFAZolin (ANCEF) 2-4 GM/100ML-% IVPB    Comments:  Rosenberger, Meredit: cabinet override      07/22/16 0531 07/22/16 0744   07/22/16 0527  ceFAZolin (ANCEF) IVPB 2g/100 mL premix     2 g 200 mL/hr over 30 Minutes Intravenous On call to O.R. 07/22/16 4268 07/22/16 0744   07/22/16 0527  ceFAZolin (ANCEF) 3 g in dextrose 5 % 50 mL IVPB  Status:  Discontinued     3 g 130 mL/hr over 30 Minutes Intravenous On call to O.R. 07/22/16 3419 07/22/16 0531    .  He was given sequential compression devices, early ambulation, and ASA 325 mg for DVT prophylaxis.  He benefited maximally from the hospital stay and there were no complications.    Recent vital signs:  Vitals:   07/23/16 0410 07/23/16 0430  BP:  123/68  Pulse:  84  Resp: 16 17  Temp:  98.8 F (37.1 C)    Recent laboratory studies:  Lab Results  Component Value Date   HGB 15.1 07/11/2016   HGB 14.5 10/26/2015   HGB 13.9 08/14/2014    Discharge Medications:     Medication List    STOP taking these medications   Testosterone 40.5 MG/2.5GM (1.62%) Gel Commonly known as:  ANDROGEL     TAKE these medications   aspirin EC 325 MG tablet Take 1 tablet (325 mg total) by mouth daily.   atorvastatin 10 MG tablet Commonly known as:  LIPITOR Take 10  mg by mouth daily at 6 PM.   docusate sodium 100 MG capsule Commonly known as:  COLACE Take 1 capsule (100 mg total) by mouth 2 (two) times daily. To prevent constipation while taking pain medication.   losartan-hydrochlorothiazide 100-12.5 MG tablet Commonly known as:  HYZAAR TAKE 1 TABLET EVERY DAY   methocarbamol 500 MG tablet Commonly known as:  ROBAXIN Take 1 tablet (500 mg total) by mouth every 6 (six) hours as needed for muscle spasms.   ondansetron 4 MG tablet Commonly known as:  ZOFRAN Take 1 tablet (4 mg total) by mouth every 8 (eight)  hours as needed for nausea or vomiting.   oxyCODONE-acetaminophen 5-325 MG tablet Commonly known as:  ROXICET Take 1-2 tablets by mouth every 4 (four) hours as needed for severe pain.   VITAMIN B-12 PO Take 1 tablet by mouth daily.   VITAMIN C PO Take 1 tablet by mouth daily.       Diagnostic Studies: Dg Knee Right Port  Result Date: 07/22/2016 CLINICAL DATA:  Knee replacement. EXAM: PORTABLE RIGHT KNEE - 1-2 VIEW COMPARISON:  No recent prior. FINDINGS: Medial hemiarthroplasty right knee. Hardware intact. Good anatomic alignment. No acute bony abnormality . IMPRESSION: Postsurgical changes right knee. Patient status post medial compartment hemiarthroplasty. Hardware intact. Good anatomic alignment. No acute bony abnormality. Electronically Signed   By: [place]   On: 07/22/2016 10:30    Disposition:  Home w/ HHPT    Follow-up Information    MURPHY, TIMOTHY D, MD. Schedule an appointment as soon as possible for a visit in 2 week(s).   Specialty:  Orthopedic Surgery Contact information: [address] [phone]           Signed: Prudencio Burly III PA-C 07/23/2016, 7:02 AM

## 2016-08-08 ENCOUNTER — Other Ambulatory Visit (HOSPITAL_COMMUNITY): Payer: Self-pay | Admitting: Orthopedic Surgery

## 2016-08-08 ENCOUNTER — Ambulatory Visit (HOSPITAL_COMMUNITY)
Admission: RE | Admit: 2016-08-08 | Discharge: 2016-08-08 | Disposition: A | Discharge status: Home / Self Care | Payer: Commercial Managed Care - HMO | Source: Ambulatory Visit | Attending: Family Medicine | Admitting: Family Medicine

## 2016-08-08 DIAGNOSIS — M7989 Other specified soft tissue disorders: Secondary | ICD-10-CM | POA: Diagnosis not present

## 2016-08-08 DIAGNOSIS — M79661 Pain in right lower leg: Secondary | ICD-10-CM

## 2016-08-08 DIAGNOSIS — R590 Localized enlarged lymph nodes: Secondary | ICD-10-CM | POA: Insufficient documentation

## 2016-08-08 NOTE — Progress Notes (Signed)
*  PRELIMINARY RESULTS* Vascular Ultrasound Right lower extremity venous duplex has been completed.  Preliminary findings: No evidence of DVT or baker's cyst.   Attempted call report. Left voice message. Will let patient leave.    Landry Mellow, RDMS, RVT  08/08/2016, 2:20 PM

## 2016-08-12 ENCOUNTER — Encounter: Payer: Self-pay | Admitting: Family Medicine

## 2016-08-12 ENCOUNTER — Ambulatory Visit (INDEPENDENT_AMBULATORY_CARE_PROVIDER_SITE_OTHER): Payer: Commercial Managed Care - HMO | Admitting: Family Medicine

## 2016-08-12 ENCOUNTER — Encounter (HOSPITAL_COMMUNITY): Payer: Self-pay | Admitting: *Deleted

## 2016-08-12 ENCOUNTER — Emergency Department (HOSPITAL_COMMUNITY)
Admission: EM | Admit: 2016-08-12 | Discharge: 2016-08-12 | Disposition: A | Discharge status: Home / Self Care | Payer: Commercial Managed Care - HMO | Attending: Emergency Medicine | Admitting: Emergency Medicine

## 2016-08-12 VITALS — BP 130/80 | HR 100 | Temp 98.3°F | Ht 72.0 in | Wt 203.0 lb

## 2016-08-12 DIAGNOSIS — I129 Hypertensive chronic kidney disease with stage 1 through stage 4 chronic kidney disease, or unspecified chronic kidney disease: Secondary | ICD-10-CM | POA: Insufficient documentation

## 2016-08-12 DIAGNOSIS — Z87891 Personal history of nicotine dependence: Secondary | ICD-10-CM | POA: Diagnosis not present

## 2016-08-12 DIAGNOSIS — R339 Retention of urine, unspecified: Secondary | ICD-10-CM | POA: Insufficient documentation

## 2016-08-12 DIAGNOSIS — IMO0001 Reserved for inherently not codable concepts without codable children: Secondary | ICD-10-CM

## 2016-08-12 DIAGNOSIS — J45909 Unspecified asthma, uncomplicated: Secondary | ICD-10-CM | POA: Diagnosis not present

## 2016-08-12 DIAGNOSIS — Z7982 Long term (current) use of aspirin: Secondary | ICD-10-CM | POA: Diagnosis not present

## 2016-08-12 DIAGNOSIS — R638 Other symptoms and signs concerning food and fluid intake: Secondary | ICD-10-CM

## 2016-08-12 DIAGNOSIS — Z79899 Other long term (current) drug therapy: Secondary | ICD-10-CM | POA: Diagnosis not present

## 2016-08-12 DIAGNOSIS — N183 Chronic kidney disease, stage 3 (moderate): Secondary | ICD-10-CM | POA: Diagnosis not present

## 2016-08-12 LAB — POCT GLYCOSYLATED HEMOGLOBIN (HGB A1C): Hemoglobin A1C: 5.4

## 2016-08-12 LAB — GLUCOSE, POCT (MANUAL RESULT ENTRY): POC Glucose: 149 mg/dl — AB (ref 70–99)

## 2016-08-12 MED ORDER — LIDOCAINE HCL (PF) 1 % IJ SOLN: 5.0000 mL | Freq: Once | INTRAMUSCULAR | Status: DC

## 2016-08-12 NOTE — Progress Notes (Signed)
Pre visit review using our clinic review tool, if applicable. No additional management support is needed unless otherwise documented below in the visit note. 

## 2016-08-12 NOTE — Progress Notes (Signed)
Subjective:     Patient ID: Jimmy Rice, male   DOB: Jun 18, 1946, 70 y.o.   MRN: [id number]  HPI Patient had recent right partial knee replacement October 17. Following surgery was unable to void and apparently went off 3 days without voiding much and eventually showed up in the emergency department at [facility] and had Foley catheter and patient reports had about 6000 mL drained. He had Foley catheter placed for 2 weeks. This was taken out through the [place] [facility] and just removed yesterday and patient was instructed and in an out catheterizations. He has been unable to avoid any without and out caths and is doing these 3-4 times daily.  He had TURP back in 2016. Previous intolerance with Flomax. Came back today concerned about increased thirst. He is concerned whether his blood sugars may be elevated. A1c last spring 5.3%. He has history of prediabetes. Not taking any decongestants. No change of medication. Currently off pain medication.  Past Medical History:  Diagnosis Date  . Arthritis    "right knee" (07/22/2016)  . BENIGN PROSTATIC HYPERTROPHY, HX OF 04/04/2009  . Blood transfusion   . Childhood asthma   . Chronic kidney disease (CKD), stage III (moderate)    cysts on kidneys, 3rd stage kidney disease  . Complication of anesthesia    woke up "fighting"  confusion  . GERD 01/18/2009  . HYPERTENSION 01/18/2009  . HYPOGONADISM 01/18/2009   Past Surgical History:  Procedure Laterality Date  . COLONOSCOPY    . INGUINAL HERNIA REPAIR Right 1997  . NASAL SINUS SURGERY  1997  . PARTIAL KNEE ARTHROPLASTY Right 07/22/2016   Procedure: UNICOMPARTMENTAL KNEE ARTHROPLASTY;  Surgeon: Renette Butters, MD;  Location: [place];  Service: Orthopedics;  Laterality: Right;  . PROSTATE BIOPSY  X 5  . 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 TRANSURETHRAL RESECTION OF PROSTATE  12/2014    reports that he has quit smoking. His smoking use included Cigarettes. He has never used smokeless  tobacco. He reports that he drinks about 0.6 oz of alcohol per week . He reports that he does not use drugs. family history includes Stomach cancer in his paternal aunt. Allergies  Allergen Reactions  . Flomax [Tamsulosin Hcl] Other (See Comments)    DIFFICULTY BREATHING NASAL STUFFINESS  . Amlodipine Hives     Review of Systems  Constitutional: Negative for appetite change, chills and fever.  Respiratory: Negative for shortness of breath.   Cardiovascular: Negative for chest pain.  Gastrointestinal: Negative for abdominal pain.  Endocrine: Positive for polydipsia.  Genitourinary: Positive for decreased urine volume. Negative for hematuria.  Neurological: Negative for dizziness.       Objective:   Physical Exam  Constitutional: He appears well-developed and well-nourished. No distress.  HENT:  Mouth/Throat: Oropharynx is clear and moist.  Cardiovascular: Normal rate and regular rhythm.   Pulmonary/Chest: Effort normal and breath sounds normal. No respiratory distress. He has no wheezes. He has no rales.  Neurological: He is alert.       Assessment:     #1 increased thirst. He has history of elevated blood sugars and prediabetes range but no history of type 2 diabetes.  #2 inability to void following recent surgery    Plan:     -Continue close follow-up with urology -Check blood sugar today which is about 3 hours postprandial and hemoglobin A1c -A1C 5.4 and CBG 149 but non-fasting.  Eulas Post MD [facility] at [facility]

## 2016-08-12 NOTE — ED Provider Notes (Signed)
[facility] DEPT Provider Note   CSN: [id number] Arrival date & time: 08/12/16  1534     History   Chief Complaint Chief Complaint  Patient presents with  . Urinary Retention    HPI Jimmy Rice is a 70 y.o. male.  HPI  70 year old male with a history of BPH who self Caths presents to the ED decreased urine output and abdominal distention last 24 hour. Patient reports that he has attempted to self cath however has had minimal urine output. Patient noted some blood during one of the attempts and presented here for evaluation. Patient denies any recent falls, fevers, chills, chest pain, shortness of breath, abdominal pain, nausea, vomiting.   Past Medical History:  Diagnosis Date  . Arthritis    "right knee" (07/22/2016)  . BENIGN PROSTATIC HYPERTROPHY, HX OF 04/04/2009  . Blood transfusion   . Childhood asthma   . Chronic kidney disease (CKD), stage III (moderate)    cysts on kidneys, 3rd stage kidney disease  . Complication of anesthesia    woke up "fighting"  confusion  . GERD 01/18/2009  . HYPERTENSION 01/18/2009  . HYPOGONADISM 01/18/2009    Patient Active Problem List   Diagnosis Date Noted  . Primary osteoarthritis of right knee 06/11/2016  . Acute constipation 12/20/2013  . Chronic kidney disease 12/02/2012  . Elevated PSA 12/27/2010  . CHRONIC RHINITIS 10/09/2010  . COUGH 10/09/2010  . ALLERGIC RHINITIS 09/03/2010  . BPH (benign prostatic hyperplasia) 04/04/2009  . Low testosterone 01/18/2009  . Essential hypertension 01/18/2009  . Asthma 01/18/2009  . GERD 01/18/2009    Past Surgical History:  Procedure Laterality Date  . COLONOSCOPY    . INGUINAL HERNIA REPAIR Right 1997  . NASAL SINUS SURGERY  1997  . PARTIAL KNEE ARTHROPLASTY Right 07/22/2016   Procedure: UNICOMPARTMENTAL KNEE ARTHROPLASTY;  Surgeon: Renette Butters, MD;  Location: [facility];  Service: Orthopedics;  Laterality: Right;  . PROSTATE BIOPSY  X 5  . REPLACEMENT UNICONDYLAR JOINT  KNEE Right 07/22/2016  . TRANSURETHRAL RESECTION OF PROSTATE  12/2014       Home Medications    Prior to Admission medications   Medication Sig Start Date End Date Taking? Authorizing Provider  Ascorbic Acid (VITAMIN C PO) Take 1 tablet by mouth daily.     Historical Provider, MD  aspirin EC 325 MG tablet Take 1 tablet (325 mg total) by mouth daily. 07/22/16   Charna Elizabeth Martensen III, PA-C  atorvastatin (LIPITOR) 10 MG tablet Take 10 mg by mouth daily at 6 PM.  10/22/15   Historical Provider, MD  Cyanocobalamin (VITAMIN B-12 PO) Take 1 tablet by mouth daily.    Historical Provider, MD  docusate sodium (COLACE) 100 MG capsule Take 1 capsule (100 mg total) by mouth 2 (two) times daily. To prevent constipation while taking pain medication. 07/22/16   Prudencio Burly III, PA-C  losartan-hydrochlorothiazide [facility]) 100-12.5 MG tablet TAKE 1 TABLET EVERY DAY 01/29/16   Eulas Post, MD    Family History Family History  Problem Relation Age of Onset  . Stomach cancer Paternal Aunt   . Colon cancer Neg Hx   . Esophageal cancer Neg Hx   . Rectal cancer Neg Hx     Social History Social History  Substance Use Topics  . Smoking status: Former Smoker    Types: Cigarettes  . Smokeless tobacco: Never Used     Comment: "quit smoking in the late 1960's; smoked < 1 pack cigarettes TOTAL"  .  Alcohol use 0.6 oz/week    1 Cans of beer per week     Allergies   Flomax [tamsulosin hcl] and Amlodipine   Review of Systems Review of Systems Ten systems are reviewed and are negative for acute change except as noted in the HPI   Physical Exam Updated Vital Signs BP 132/87 (BP Location: Left Arm)   Pulse 101   Temp 98.4 F (36.9 C) (Oral)   Resp 16   SpO2 100%   Physical Exam  Constitutional: He is oriented to person, place, and time. He appears well-developed and well-nourished. No distress.  HENT:  Head: Normocephalic and atraumatic.  Nose: Nose normal.  Eyes:  Conjunctivae and EOM are normal. Pupils are equal, round, and reactive to light. Right eye exhibits no discharge. Left eye exhibits no discharge. No scleral icterus.  Neck: Normal range of motion. Neck supple.  Cardiovascular: Normal rate and regular rhythm.  Exam reveals no gallop and no friction rub.   No murmur heard. Pulmonary/Chest: Effort normal and breath sounds normal. No stridor. No respiratory distress. He has no rales.  Abdominal: Soft. He exhibits no distension. There is no tenderness.  Musculoskeletal: He exhibits no edema or tenderness.  Neurological: He is alert and oriented to person, place, and time.  Skin: Skin is warm and dry. No rash noted. He is not diaphoretic. No erythema.  Psychiatric: He has a normal mood and affect.  Vitals reviewed.    ED Treatments / Results  Labs (all labs ordered are listed, but only abnormal results are displayed) Labs Reviewed - No data to display  EKG  EKG Interpretation None       Radiology No results found.  Procedures Procedures (including critical care time)  Medications Ordered in ED Medications - No data to display   Initial Impression / Assessment and Plan / ED Course  I have reviewed the triage vital signs and the nursing notes.  Pertinent labs & imaging results that were available during my care of the patient were reviewed by me and considered in my medical decision making (see chart for details).  Clinical Course     Bladder scan noted to have greater than 1000 mL in the bladder. Patient was given a self cath kit and instructed to perform self cathing while we observed. We note that they look like the catheter was likely coiling. When additional pressure was applied catheter was able to put out 1400 mL of urine. Post void residual of 161cc. Significant movement in his abdominal distention.  Feel patient is safe for discharge.  Final Clinical Impressions(s) / ED Diagnoses   Final diagnoses:  Urinary  retention   Disposition: Discharge  Condition: Good  I have discussed the results, Dx and Tx plan with the patient who expressed understanding and agree(s) with the plan. Discharge instructions discussed at great length. The patient was given strict return precautions who verbalized understanding of the instructions. No further questions at time of discharge.    Current Discharge Medication List      Follow Up: Eulas Post, MD [place] [address] [phone]  Schedule an appointment as soon as possible for a visit  As needed      Fatima Blank, MD 08/12/16 2049

## 2016-08-12 NOTE — ED Triage Notes (Signed)
Pt reports that he is having to self catheterize himself. Pt states that he did it for the first time yesterday without difficuly. Today he is unable to get urine return and has noticed blood. Pt states that he has been drinking a lot of water and now has abdominal pain.

## 2016-08-12 NOTE — Patient Instructions (Signed)
Continue close follow up with Urology. Your blood sugars are essentially normal (for non-fasting)

## 2016-08-12 NOTE — ED Notes (Signed)
Pt reports urinary retention since last night.  States he self caths and has tried twice today without any output and noticed blood in the catheter when he took the catheter out.

## 2016-10-16 ENCOUNTER — Telehealth: Payer: Self-pay | Admitting: Family Medicine

## 2016-10-16 MED ORDER — LOSARTAN POTASSIUM-HCTZ 100-12.5 MG PO TABS: 1.0000 | ORAL_TABLET | Freq: Every day | ORAL | 2 refills | Status: DC

## 2016-10-16 MED ORDER — ATORVASTATIN CALCIUM 10 MG PO TABS: 10.0000 mg | ORAL_TABLET | Freq: Every day | ORAL | 2 refills | Status: DC

## 2016-10-16 NOTE — Telephone Encounter (Signed)
Medication sent in for patient. 

## 2016-10-16 NOTE — Telephone Encounter (Signed)
Pt has a new pharm [employer] home delivery. Pt needs new rxs atorvastatin 10  Mg #90, losartan-hctz 100-12.5 mg #90 w/refills

## 2016-11-03 ENCOUNTER — Encounter: Payer: Self-pay | Admitting: Family Medicine

## 2016-11-03 ENCOUNTER — Ambulatory Visit (INDEPENDENT_AMBULATORY_CARE_PROVIDER_SITE_OTHER): Payer: Medicare HMO | Admitting: Family Medicine

## 2016-11-03 VITALS — BP 142/82 | HR 76 | Temp 97.8°F | Ht 70.5 in | Wt 203.5 lb

## 2016-11-03 DIAGNOSIS — Z0000 Encounter for general adult medical examination without abnormal findings: Secondary | ICD-10-CM

## 2016-11-03 LAB — BASIC METABOLIC PANEL
BUN: 17 mg/dL (ref 6–23)
CALCIUM: 9.6 mg/dL (ref 8.4–10.5)
CO2: 30 mEq/L (ref 19–32)
CREATININE: 1.59 mg/dL — AB (ref 0.40–1.50)
Chloride: 100 mEq/L (ref 96–112)
GFR: 55.46 mL/min — ABNORMAL LOW (ref >60.00)
Glucose, Bld: 103 mg/dL — ABNORMAL HIGH (ref 70–99)
Potassium: 3.8 mEq/L (ref 3.5–5.1)
Sodium: 138 mEq/L (ref 135–145)

## 2016-11-03 LAB — LIPID PANEL
CHOL/HDL RATIO: 3
CHOLESTEROL: 145 mg/dL (ref 0–200)
HDL: 44.4 mg/dL (ref >39.00)
LDL Cholesterol: 75 mg/dL (ref 0–99)
NonHDL: 100.14
TRIGLYCERIDES: 128 mg/dL (ref 0.0–149.0)
VLDL: 25.6 mg/dL (ref 0.0–40.0)

## 2016-11-03 LAB — HEPATIC FUNCTION PANEL
ALK PHOS: 63 U/L (ref 39–117)
ALT: 17 U/L (ref 0–53)
AST: 17 U/L (ref 0–37)
Albumin: 4.3 g/dL (ref 3.5–5.2)
BILIRUBIN DIRECT: 0.2 mg/dL (ref 0.0–0.3)
BILIRUBIN TOTAL: 0.9 mg/dL (ref 0.2–1.2)
TOTAL PROTEIN: 7.3 g/dL (ref 6.0–8.3)

## 2016-11-03 NOTE — Progress Notes (Signed)
Subjective:     Patient ID: Jimmy Rice, male   DOB: November 29, 1945, 71 y.o.   MRN: [id number]  HPI Patient seen for physical exam. His chronic problems include history of chronic kidney disease, hypogonadism, hypertension, GERD, elevated PSA. He is scheduled for prostate biopsy in February. Had previous biopsies which have not shown conclusive evidence for prostate cancer. He is followed by nephrology for his chronic kidney disease.  Hypertension which is treated with losartan HCTZ. He takes atorvastatin for hyperlipidemia. Generally feels well. He is considering moving to [place] later this year.  No previous hepatitis C antibody testing. Denies specific risk factors other than age. Colonoscopy up-to-date. Denies any recent falls.  Past Medical History:  Diagnosis Date  . Arthritis    "right knee" (07/22/2016)  . BENIGN PROSTATIC HYPERTROPHY, HX OF 04/04/2009  . Blood transfusion   . Childhood asthma   . Chronic kidney disease (CKD), stage III (moderate)    cysts on kidneys, 3rd stage kidney disease  . Complication of anesthesia    woke up "fighting"  confusion  . GERD 01/18/2009  . HYPERTENSION 01/18/2009  . HYPOGONADISM 01/18/2009   Past Surgical History:  Procedure Laterality Date  . COLONOSCOPY    . INGUINAL HERNIA REPAIR Right 1997  . NASAL SINUS SURGERY  1997  . PARTIAL KNEE ARTHROPLASTY Right 07/22/2016   Procedure: UNICOMPARTMENTAL KNEE ARTHROPLASTY;  Surgeon: Renette Butters, MD;  Location: [facility];  Service: Orthopedics;  Laterality: Right;  . PROSTATE BIOPSY  X 5  . 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 TRANSURETHRAL RESECTION OF PROSTATE  12/2014    reports that he has quit smoking. His smoking use included Cigarettes. He has never used smokeless tobacco. He reports that he drinks about 0.6 oz of alcohol per week . He reports that he does not use drugs. family history includes Stomach cancer in his paternal aunt. Allergies  Allergen Reactions  .  Flomax [Tamsulosin Hcl] Other (See Comments)    DIFFICULTY BREATHING NASAL STUFFINESS  . Amlodipine Hives     Review of Systems  Constitutional: Negative for activity change, appetite change, fatigue, fever and unexpected weight change.  HENT: Negative for congestion, ear pain and trouble swallowing.   Eyes: Negative for pain and visual disturbance.  Respiratory: Negative for cough, shortness of breath and wheezing.   Cardiovascular: Negative for chest pain and palpitations.  Gastrointestinal: Negative for abdominal distention, abdominal pain, blood in stool, constipation, diarrhea, nausea, rectal pain and vomiting.  Endocrine: Negative for polydipsia and polyuria.  Genitourinary: Negative for dysuria, hematuria and testicular pain.  Musculoskeletal: Positive for arthralgias. Negative for joint swelling.  Skin: Negative for rash.  Neurological: Negative for dizziness, syncope and headaches.  Hematological: Negative for adenopathy.  Psychiatric/Behavioral: Negative for confusion and dysphoric mood.       Objective:   Physical Exam  Constitutional: He is oriented to person, place, and time. He appears well-developed and well-nourished. No distress.  HENT:  Head: Normocephalic and atraumatic.  Right Ear: External ear normal.  Left Ear: External ear normal.  Mouth/Throat: Oropharynx is clear and moist.  Eyes: Conjunctivae and EOM are normal. Pupils are equal, round, and reactive to light.  Neck: Normal range of motion. Neck supple. No thyromegaly present.  Cardiovascular: Normal rate, regular rhythm and normal heart sounds.   No murmur heard. Pulmonary/Chest: No respiratory distress. He has no wheezes. He has no rales.  Abdominal: Soft. Bowel sounds are normal. He exhibits no distension and no mass. There is  no tenderness. There is no rebound and no guarding.  Musculoskeletal: He exhibits no edema.  Lymphadenopathy:    He has no cervical adenopathy.  Neurological: He is alert and  oriented to person, place, and time. He displays normal reflexes. No cranial nerve deficit.  Skin: No rash noted.  Psychiatric: He has a normal mood and affect.       Assessment:     Physical exam. Patient has multiple chronic medical problems as above but relatively stable. No history of hepatitis C antibody testing. He needs tetanus booster but is aware insurance may not cover as part of physical    Plan:     -Check labs with hepatitis C antibody, lipid panel, basic metabolic panel, and hepatic panel -Continue current medications -Establish more consistent exercise  Eulas Post MD [facility] at [facility]

## 2016-11-03 NOTE — Progress Notes (Signed)
Pre visit review using our clinic review tool, if applicable. No additional management support is needed unless otherwise documented below in the visit note. 

## 2016-11-04 ENCOUNTER — Encounter: Payer: Self-pay | Admitting: Family Medicine

## 2016-11-04 DIAGNOSIS — R350 Frequency of micturition: Secondary | ICD-10-CM | POA: Diagnosis not present

## 2016-11-04 DIAGNOSIS — R339 Retention of urine, unspecified: Secondary | ICD-10-CM | POA: Diagnosis not present

## 2016-11-04 LAB — HEPATITIS C ANTIBODY: HCV Ab: NEGATIVE

## 2016-11-07 DIAGNOSIS — H25813 Combined forms of age-related cataract, bilateral: Secondary | ICD-10-CM | POA: Diagnosis not present

## 2016-11-07 DIAGNOSIS — H01025 Squamous blepharitis left lower eyelid: Secondary | ICD-10-CM | POA: Diagnosis not present

## 2016-11-07 DIAGNOSIS — H01022 Squamous blepharitis right lower eyelid: Secondary | ICD-10-CM | POA: Diagnosis not present

## 2016-11-07 DIAGNOSIS — H01021 Squamous blepharitis right upper eyelid: Secondary | ICD-10-CM | POA: Diagnosis not present

## 2016-11-07 DIAGNOSIS — H01024 Squamous blepharitis left upper eyelid: Secondary | ICD-10-CM | POA: Diagnosis not present

## 2016-11-07 DIAGNOSIS — H04123 Dry eye syndrome of bilateral lacrimal glands: Secondary | ICD-10-CM | POA: Diagnosis not present

## 2016-11-07 DIAGNOSIS — H43812 Vitreous degeneration, left eye: Secondary | ICD-10-CM | POA: Diagnosis not present

## 2016-11-11 DIAGNOSIS — N320 Bladder-neck obstruction: Secondary | ICD-10-CM | POA: Diagnosis not present

## 2016-11-11 DIAGNOSIS — R972 Elevated prostate specific antigen [PSA]: Secondary | ICD-10-CM | POA: Diagnosis not present

## 2016-11-11 DIAGNOSIS — C61 Malignant neoplasm of prostate: Secondary | ICD-10-CM | POA: Diagnosis not present

## 2016-11-11 DIAGNOSIS — N419 Inflammatory disease of prostate, unspecified: Secondary | ICD-10-CM | POA: Diagnosis not present

## 2016-11-25 DIAGNOSIS — N138 Other obstructive and reflux uropathy: Secondary | ICD-10-CM | POA: Diagnosis not present

## 2016-11-25 DIAGNOSIS — R972 Elevated prostate specific antigen [PSA]: Secondary | ICD-10-CM | POA: Diagnosis not present

## 2016-11-25 DIAGNOSIS — N320 Bladder-neck obstruction: Secondary | ICD-10-CM | POA: Diagnosis not present

## 2016-11-25 DIAGNOSIS — N529 Male erectile dysfunction, unspecified: Secondary | ICD-10-CM | POA: Diagnosis not present

## 2016-11-25 DIAGNOSIS — N401 Enlarged prostate with lower urinary tract symptoms: Secondary | ICD-10-CM | POA: Diagnosis not present

## 2016-11-26 ENCOUNTER — Telehealth: Payer: Self-pay | Admitting: Family Medicine

## 2016-11-26 NOTE — Telephone Encounter (Signed)
Pt is calling to notify dr Jimmy Rice he has psa cancer

## 2016-12-10 DIAGNOSIS — C61 Malignant neoplasm of prostate: Secondary | ICD-10-CM | POA: Diagnosis not present

## 2017-01-01 DIAGNOSIS — R3129 Other microscopic hematuria: Secondary | ICD-10-CM | POA: Diagnosis not present

## 2017-01-01 DIAGNOSIS — K5730 Diverticulosis of large intestine without perforation or abscess without bleeding: Secondary | ICD-10-CM | POA: Diagnosis not present

## 2017-01-01 DIAGNOSIS — C61 Malignant neoplasm of prostate: Secondary | ICD-10-CM | POA: Diagnosis not present

## 2017-01-09 DIAGNOSIS — C61 Malignant neoplasm of prostate: Secondary | ICD-10-CM | POA: Diagnosis not present

## 2017-01-09 DIAGNOSIS — R3129 Other microscopic hematuria: Secondary | ICD-10-CM | POA: Diagnosis not present

## 2017-01-09 DIAGNOSIS — R3121 Asymptomatic microscopic hematuria: Secondary | ICD-10-CM | POA: Diagnosis not present

## 2017-01-16 DIAGNOSIS — N183 Chronic kidney disease, stage 3 (moderate): Secondary | ICD-10-CM | POA: Diagnosis not present

## 2017-01-16 DIAGNOSIS — Z79899 Other long term (current) drug therapy: Secondary | ICD-10-CM | POA: Diagnosis not present

## 2017-01-16 DIAGNOSIS — C61 Malignant neoplasm of prostate: Secondary | ICD-10-CM | POA: Diagnosis not present

## 2017-01-16 DIAGNOSIS — K08409 Partial loss of teeth, unspecified cause, unspecified class: Secondary | ICD-10-CM | POA: Diagnosis not present

## 2017-01-16 DIAGNOSIS — Z0000 Encounter for general adult medical examination without abnormal findings: Secondary | ICD-10-CM | POA: Diagnosis not present

## 2017-01-16 DIAGNOSIS — I129 Hypertensive chronic kidney disease with stage 1 through stage 4 chronic kidney disease, or unspecified chronic kidney disease: Secondary | ICD-10-CM | POA: Diagnosis not present

## 2017-01-16 DIAGNOSIS — Z972 Presence of dental prosthetic device (complete) (partial): Secondary | ICD-10-CM | POA: Diagnosis not present

## 2017-01-16 DIAGNOSIS — Z6829 Body mass index (BMI) 29.0-29.9, adult: Secondary | ICD-10-CM | POA: Diagnosis not present

## 2017-01-16 DIAGNOSIS — E785 Hyperlipidemia, unspecified: Secondary | ICD-10-CM | POA: Diagnosis not present

## 2017-03-31 ENCOUNTER — Encounter: Payer: Self-pay | Admitting: Family Medicine

## 2017-03-31 ENCOUNTER — Ambulatory Visit (INDEPENDENT_AMBULATORY_CARE_PROVIDER_SITE_OTHER): Payer: Medicare HMO | Admitting: Family Medicine

## 2017-03-31 VITALS — BP 130/80 | HR 78 | Temp 98.4°F | Ht 71.5 in | Wt 209.6 lb

## 2017-03-31 DIAGNOSIS — C61 Malignant neoplasm of prostate: Secondary | ICD-10-CM

## 2017-03-31 DIAGNOSIS — M47816 Spondylosis without myelopathy or radiculopathy, lumbar region: Secondary | ICD-10-CM

## 2017-03-31 DIAGNOSIS — M160 Bilateral primary osteoarthritis of hip: Secondary | ICD-10-CM | POA: Diagnosis not present

## 2017-03-31 LAB — PSA: PSA: 6.54 ng/mL — AB (ref 0.10–4.00)

## 2017-03-31 NOTE — Progress Notes (Signed)
Subjective:     Patient ID: Jimmy Rice, male   DOB: Oct 01, 1946, 71 y.o.   MRN: [id number]  HPI Patient here to discuss the following issues  Long-standing history of elevated PSA. He states he had approximately 6 biopsies previously. These all been negative until this past spring. 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 They were looking at possible surgery and patient went to [place] for second opinion and that physician recommended observation which he plans to do. Patient is requesting repeat PSA this time. No slow stream.  Patient states he had a fall a few months ago. He was seen at [facility] and x-rays were done of his lumbar spine showed "spondylosis ". 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 He has some low back pain but not debilitating. 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 This limits his walking but he is able to cycle and has been doing this almost daily.  Past Medical History:  Diagnosis Date  . Arthritis    "right knee" (07/22/2016)  . BENIGN PROSTATIC HYPERTROPHY, HX OF 04/04/2009  . Blood transfusion   . Childhood asthma   . Chronic kidney disease (CKD), stage III (moderate)    cysts on kidneys, 3rd stage kidney disease  . Complication of anesthesia    woke up "fighting"  confusion  . GERD 01/18/2009  . HYPERTENSION 01/18/2009  . HYPOGONADISM 01/18/2009   Past Surgical History:  Procedure Laterality Date  . COLONOSCOPY    . INGUINAL HERNIA REPAIR Right 1997  . NASAL SINUS SURGERY  1997  . PARTIAL KNEE ARTHROPLASTY Right 07/22/2016   Procedure: UNICOMPARTMENTAL KNEE ARTHROPLASTY;  Surgeon: Renette Butters, MD;  Location: [facility];  Service: Orthopedics;  Laterality: Right;  . PROSTATE BIOPSY  X 5  . 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 TRANSURETHRAL RESECTION OF PROSTATE  12/2014    reports that he has quit smoking. His smoking use included Cigarettes. He has never used smokeless tobacco. He reports that he drinks about 0.6 oz of alcohol per week . He reports that he does not use drugs. family history includes Stomach cancer in his paternal aunt. Allergies  Allergen Reactions  . Flomax [Tamsulosin Hcl] Other (See Comments)    DIFFICULTY BREATHING NASAL STUFFINESS  . Amlodipine Hives     Review of Systems  Constitutional: Negative for fatigue.  Eyes: Negative for visual disturbance.  Respiratory: Negative for cough, chest tightness and shortness of breath.   Cardiovascular: Negative for chest pain, palpitations and leg swelling.  Genitourinary: Negative for difficulty urinating and dysuria.  Musculoskeletal: Positive for arthralgias.  Neurological: Negative for dizziness, syncope, weakness, light-headedness and headaches.       Objective:   Physical Exam  Constitutional: He is oriented to person, place, and time. He appears well-developed and well-nourished.  HENT:  Right Ear: External ear normal.  Left Ear: External ear normal.  Mouth/Throat: Oropharynx is clear and moist.  Eyes: Pupils are equal, round, and reactive to light.  Neck: Neck supple. No thyromegaly present.  Cardiovascular: Normal rate and regular rhythm.   Pulmonary/Chest: Effort normal and breath sounds normal. No respiratory distress. He has no wheezes. He has no rales.  Musculoskeletal: He exhibits no edema.  Neurological: He is alert and oriented to person, place, and time.       Assessment:     #  1 adenocarcinoma of the prostate by recent biopsy. Patient has elected to observe and is being followed by urologist in [place] as above  #2 osteoarthritis involving lumbar spine and hips-minimally debilitating and minimal pain at this time    Plan:     -Patient requesting repeat PSA. We have advised that he get information for his urologist in [place] so we can forward results -We  discussed appropriate exercises for his back and hip -Use Tylenol as needed for arthritis pains. Avoid nonsteroidals.  Eulas Post MD [facility] at [facility]

## 2017-04-27 ENCOUNTER — Telehealth: Payer: Self-pay | Admitting: Family Medicine

## 2017-04-27 MED ORDER — ATORVASTATIN CALCIUM 10 MG PO TABS: 10.0000 mg | ORAL_TABLET | Freq: Every day | ORAL | 1 refills | Status: DC

## 2017-04-27 MED ORDER — LOSARTAN POTASSIUM-HCTZ 100-12.5 MG PO TABS: 1.0000 | ORAL_TABLET | Freq: Every day | ORAL | 1 refills | Status: DC

## 2017-04-27 NOTE — Telephone Encounter (Signed)
° ° °  Pt request refill of the following:   Phamacy:  [facility] home delivery    atorvastatin (LIPITOR) 10 MG tablet   losartan-hydrochlorothiazide (HYZAAR) 100-12.5 MG tablet

## 2017-05-01 ENCOUNTER — Telehealth: Payer: Self-pay

## 2017-05-01 NOTE — Telephone Encounter (Signed)
Patient is on the list for [employer] 2018 and may be a good candidate for an AWV. Please let me know if/when appt is scheduled.   

## 2017-06-10 DIAGNOSIS — C61 Malignant neoplasm of prostate: Secondary | ICD-10-CM | POA: Diagnosis not present

## 2017-06-25 ENCOUNTER — Encounter: Payer: Self-pay | Admitting: Family Medicine

## 2017-06-25 DIAGNOSIS — H01025 Squamous blepharitis left lower eyelid: Secondary | ICD-10-CM | POA: Diagnosis not present

## 2017-06-25 DIAGNOSIS — H01021 Squamous blepharitis right upper eyelid: Secondary | ICD-10-CM | POA: Diagnosis not present

## 2017-06-25 DIAGNOSIS — H04123 Dry eye syndrome of bilateral lacrimal glands: Secondary | ICD-10-CM | POA: Diagnosis not present

## 2017-06-25 DIAGNOSIS — H43812 Vitreous degeneration, left eye: Secondary | ICD-10-CM | POA: Diagnosis not present

## 2017-06-25 DIAGNOSIS — H01022 Squamous blepharitis right lower eyelid: Secondary | ICD-10-CM | POA: Diagnosis not present

## 2017-06-25 DIAGNOSIS — H25813 Combined forms of age-related cataract, bilateral: Secondary | ICD-10-CM | POA: Diagnosis not present

## 2017-06-25 DIAGNOSIS — H01024 Squamous blepharitis left upper eyelid: Secondary | ICD-10-CM | POA: Diagnosis not present

## 2017-07-27 ENCOUNTER — Encounter: Payer: Self-pay | Admitting: Family Medicine

## 2017-07-27 ENCOUNTER — Ambulatory Visit (INDEPENDENT_AMBULATORY_CARE_PROVIDER_SITE_OTHER): Payer: Medicare HMO | Admitting: Family Medicine

## 2017-07-27 VITALS — BP 120/80 | HR 94 | Temp 98.3°F | Wt 208.8 lb

## 2017-07-27 DIAGNOSIS — M545 Low back pain, unspecified: Secondary | ICD-10-CM

## 2017-07-27 NOTE — Progress Notes (Signed)
Subjective:     Patient ID: Jimmy Rice, male   DOB: [birth year], 71 y.o.   MRN: [id number]  HPI Patient seen initially complaining of right "hip "pain. His pain is actually more right lower lumbar area. He first noted this this past week when playing golf. He had difficulty finishing the course. He was not aware of any specific injury at that time. His  pain which is "dull and achy" pain radiating from the right lower lumbar into the buttock region. Better with rest. No numbness or weakness. He's had previous x-rays of both hips about 2 years ago which showed some mild degenerative changes. Denies any recent fall or injury. No groin pain.  Past Medical History:  Diagnosis Date  . Arthritis    "right knee" (07/22/2016)  . BENIGN PROSTATIC HYPERTROPHY, HX OF 04/04/2009  . Blood transfusion   . Childhood asthma   . Chronic kidney disease (CKD), stage III (moderate) (HCC)    cysts on kidneys, 3rd stage kidney disease  . Complication of anesthesia    woke up "fighting"  confusion  . GERD 01/18/2009  . HYPERTENSION 01/18/2009  . HYPOGONADISM 01/18/2009   Past Surgical History:  Procedure Laterality Date  . COLONOSCOPY    . INGUINAL HERNIA REPAIR Right 1997  . NASAL SINUS SURGERY  1997  . PARTIAL KNEE ARTHROPLASTY Right 07/22/2016   Procedure: UNICOMPARTMENTAL KNEE ARTHROPLASTY;  Surgeon: Renette Butters, MD;  Location: [facility];  Service: Orthopedics;  Laterality: Right;  . PROSTATE BIOPSY  X 5  . 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 TRANSURETHRAL RESECTION OF PROSTATE  12/2014    reports that he has quit smoking. His smoking use included Cigarettes. He has never used smokeless tobacco. He reports that he drinks about 0.6 oz of alcohol per week . He reports that he does not use drugs. family history includes Stomach cancer in his paternal aunt. Allergies  Allergen Reactions  . Flomax [Tamsulosin Hcl] Other (See Comments)    DIFFICULTY BREATHING NASAL STUFFINESS  .  Amlodipine Hives     Review of Systems  Constitutional: Negative for activity change, appetite change and fever.  Respiratory: Negative for cough and shortness of breath.   Cardiovascular: Negative for chest pain and leg swelling.  Gastrointestinal: Negative for abdominal pain and vomiting.  Genitourinary: Negative for dysuria, flank pain and hematuria.  Musculoskeletal: Positive for back pain. Negative for joint swelling.  Neurological: Negative for weakness and numbness.       Objective:   Physical Exam  Constitutional: He is oriented to person, place, and time. He appears well-developed and well-nourished. No distress.  Neck: No thyromegaly present.  Cardiovascular: Normal rate, regular rhythm and normal heart sounds.   No murmur heard. Pulmonary/Chest: Effort normal and breath sounds normal. No respiratory distress. He has no wheezes. He has no rales.  Musculoskeletal: He exhibits no edema.  has mild tenderness right lower lumbar region. Straight leg raises are negative.  Neurological: He is alert and oriented to person, place, and time. He has normal reflexes. No cranial nerve deficit.  Skin: No rash noted.       Assessment:     Right lower lumbar back pain-?muscular strain.  Non focal neuro exam.    Plan:     -Recommend conservative measures including topical sports cream, icing, stretches -Touch base and 2-3 weeks if not resolving  Eulas Post MD [facility] at [facility]

## 2017-07-27 NOTE — Patient Instructions (Signed)
Try OTC sports cream such as Biofreeze. Consider icing 15-20 minutes 2-3 times daily. Let me know in 2-3 weeks if no better.

## 2017-08-05 DIAGNOSIS — N2581 Secondary hyperparathyroidism of renal origin: Secondary | ICD-10-CM | POA: Diagnosis not present

## 2017-08-05 DIAGNOSIS — D631 Anemia in chronic kidney disease: Secondary | ICD-10-CM | POA: Diagnosis not present

## 2017-08-05 DIAGNOSIS — E785 Hyperlipidemia, unspecified: Secondary | ICD-10-CM | POA: Diagnosis not present

## 2017-08-05 DIAGNOSIS — N183 Chronic kidney disease, stage 3 (moderate): Secondary | ICD-10-CM | POA: Diagnosis not present

## 2017-08-10 ENCOUNTER — Encounter: Payer: Self-pay | Admitting: Family Medicine

## 2017-08-10 ENCOUNTER — Ambulatory Visit (INDEPENDENT_AMBULATORY_CARE_PROVIDER_SITE_OTHER): Payer: Medicare HMO | Admitting: Family Medicine

## 2017-08-10 VITALS — BP 134/82 | HR 70 | Temp 98.0°F | Ht 71.5 in | Wt 208.8 lb

## 2017-08-10 DIAGNOSIS — R109 Unspecified abdominal pain: Secondary | ICD-10-CM

## 2017-08-10 DIAGNOSIS — N183 Chronic kidney disease, stage 3 unspecified: Secondary | ICD-10-CM

## 2017-08-10 DIAGNOSIS — I10 Essential (primary) hypertension: Secondary | ICD-10-CM

## 2017-08-10 DIAGNOSIS — R1031 Right lower quadrant pain: Secondary | ICD-10-CM | POA: Diagnosis not present

## 2017-08-10 LAB — POCT URINALYSIS DIPSTICK
Bilirubin, UA: NEGATIVE
Glucose, UA: NEGATIVE
Ketones, UA: NEGATIVE
Nitrite, UA: NEGATIVE
PROTEIN UA: 2
SPEC GRAV UA: 1.015 (ref 1.010–1.025)
Urobilinogen, UA: 0.2 E.U./dL
pH, UA: 5 (ref 5.0–8.0)

## 2017-08-10 NOTE — Patient Instructions (Signed)
Drink plenty of fluids Follow up promptly for any fever We will try to get x-rays set up to rule out kidney stone.

## 2017-08-10 NOTE — Progress Notes (Signed)
Subjective:     Patient ID: Jimmy Rice, male   DOB: [birth year], 71 y.o.   MRN: [id number]  HPI Patient here to discuss the following issues. He has chronic kidney disease and had recent labs per nephrology with creatinine increased to 2.24 with BUN of 25. These labs were obtained on 08/05/17. GFR 33. Patient was taken off Hyzaar. He has follow-up with nephrologist on the 16th of this month for repeat blood pressure check and repeat renal function.  Other concern is for some progressive right flank pain. Increased over the past few weeks. He's not had any gross hematuria. No burning with urination. He is concerned about possible kidney stone. No history of stones. Pain is no better or worse with movement and relatively continuous. Has had some recent radiation toward the right groin area. Pain is moderate to severe at times. No alleviating factors. Cannot take nonsteroidals. He declines any pain medication. No fevers or chills. No stool changes. No nausea or vomiting.   Past Medical History:  Diagnosis Date  . Arthritis    "right knee" (07/22/2016)  . BENIGN PROSTATIC HYPERTROPHY, HX OF 04/04/2009  . Blood transfusion   . Childhood asthma   . Chronic kidney disease (CKD), stage III (moderate) (HCC)    cysts on kidneys, 3rd stage kidney disease  . Complication of anesthesia    woke up "fighting"  confusion  . GERD 01/18/2009  . HYPERTENSION 01/18/2009  . HYPOGONADISM 01/18/2009   Past Surgical History:  Procedure Laterality Date  . COLONOSCOPY    . INGUINAL HERNIA REPAIR Right 1997  . NASAL SINUS SURGERY  1997  . PROSTATE BIOPSY  X 5  . 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 TRANSURETHRAL RESECTION OF PROSTATE  12/2014    reports that he has quit smoking. His smoking use included cigarettes. he has never used smokeless tobacco. He reports that he drinks about 0.6 oz of alcohol per week. He reports that he does not use drugs. family history includes Stomach cancer in  his paternal aunt. Allergies  Allergen Reactions  . Flomax [Tamsulosin Hcl] Other (See Comments)    DIFFICULTY BREATHING NASAL STUFFINESS  . Amlodipine Hives      Review of Systems  Constitutional: Negative for chills and fever.  Respiratory: Negative for shortness of breath.   Cardiovascular: Negative for chest pain.  Gastrointestinal: Negative for abdominal pain, nausea and vomiting.  Genitourinary: Positive for flank pain. Negative for difficulty urinating and hematuria.       Objective:   Physical Exam  Constitutional: He appears well-developed and well-nourished.  Cardiovascular: Normal rate and regular rhythm.  Pulmonary/Chest: Effort normal and breath sounds normal. No respiratory distress. He has no wheezes. He has no rales.  Abdominal: Soft. There is no tenderness.  Skin: No rash noted.       Assessment:     #1 hypertension currently stable off medication  #2 chronic kidney disease with recent discontinuation of Hyzaar per nephrology  #3 right flank pain. Her dipstick does reveal small leukocytes and small blood    Plan:     -Urine sent for culture and reflux microscopy -Set up noncontrast CT abdomen and pelvis to further evaluate his progressive right flank pain -Offered pain medication and patient declines -Stay well-hydrated  Eulas Post MD [facility] at [facility]

## 2017-08-11 LAB — URINALYSIS, ROUTINE W REFLEX MICROSCOPIC
Bilirubin Urine: NEGATIVE
KETONES UR: NEGATIVE
Nitrite: NEGATIVE
PH: 5.5 (ref 5.0–8.0)
SPECIFIC GRAVITY, URINE: 1.02 (ref 1.000–1.030)
URINE GLUCOSE: NEGATIVE
Urobilinogen, UA: 0.2 (ref 0.0–1.0)

## 2017-08-11 LAB — URINE CULTURE
MICRO NUMBER:: 81239854
RESULT: NO GROWTH
SPECIMEN QUALITY: ADEQUATE

## 2017-08-14 ENCOUNTER — Ambulatory Visit (INDEPENDENT_AMBULATORY_CARE_PROVIDER_SITE_OTHER)
Admission: RE | Admit: 2017-08-14 | Discharge: 2017-08-14 | Disposition: A | Discharge status: Home / Self Care | Payer: Medicare HMO | Source: Ambulatory Visit | Attending: Family Medicine | Admitting: Family Medicine

## 2017-08-14 DIAGNOSIS — R1031 Right lower quadrant pain: Secondary | ICD-10-CM | POA: Diagnosis not present

## 2017-08-14 DIAGNOSIS — R109 Unspecified abdominal pain: Secondary | ICD-10-CM | POA: Diagnosis not present

## 2017-08-14 IMAGING — CT CT RENAL STONE PROTOCOL
2 of 4 series · 15 of 46 positions shown, 17 images · non-contrast
Comparison: None.

CLINICAL DATA: 71-year-old male with right flank pain radiating to
the groin for the past 6 weeks. Positive for microscopic hematuria.

EXAM:
CT ABDOMEN AND PELVIS WITHOUT CONTRAST
TECHNIQUE: Multidetector CT imaging of the abdomen and pelvis was performed
following the standard protocol without IV contrast.

[Series 2: stone study 5.0 i30f 1 · axial · 0.70mm/px · z∈[-459,-79]mm · 12 of 84 slices shown, 14 images]
[im 4/84  soft-tissue]
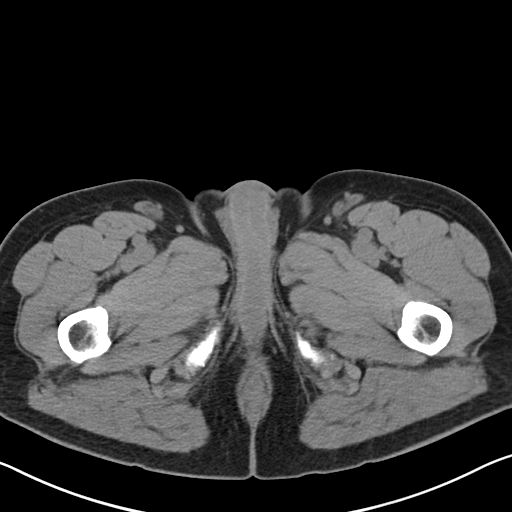
[im 4/84  bone]
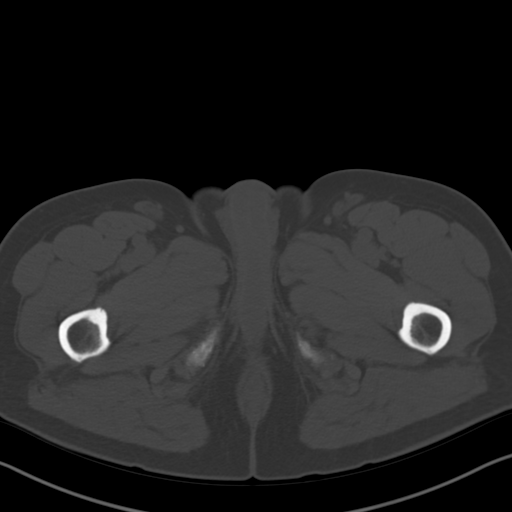
[im 11/84  soft-tissue]
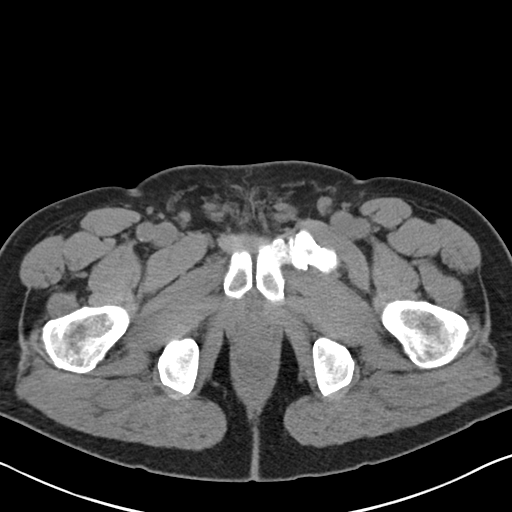
[im 18/84  soft-tissue]
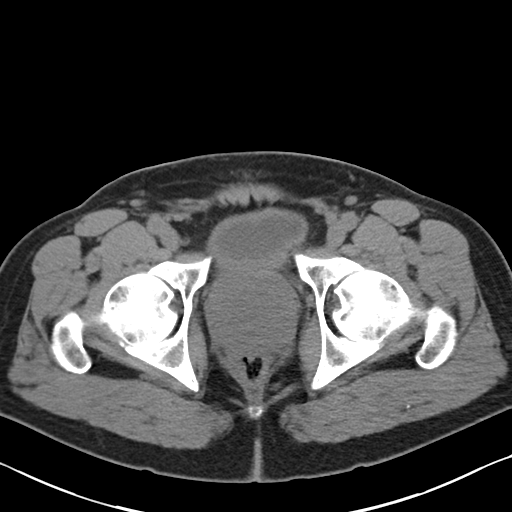
[im 25/84  soft-tissue]
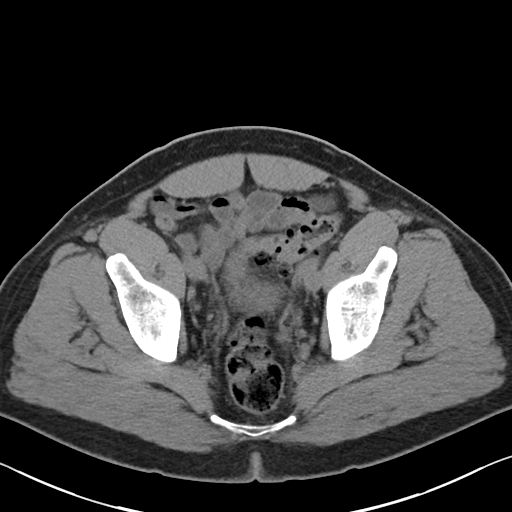
[im 32/84  soft-tissue]
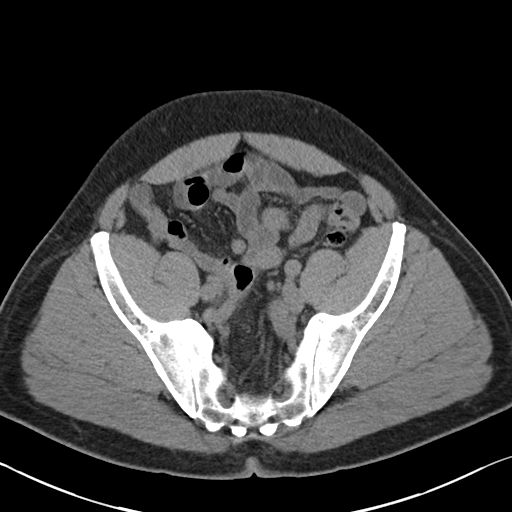
[im 39/84  soft-tissue]
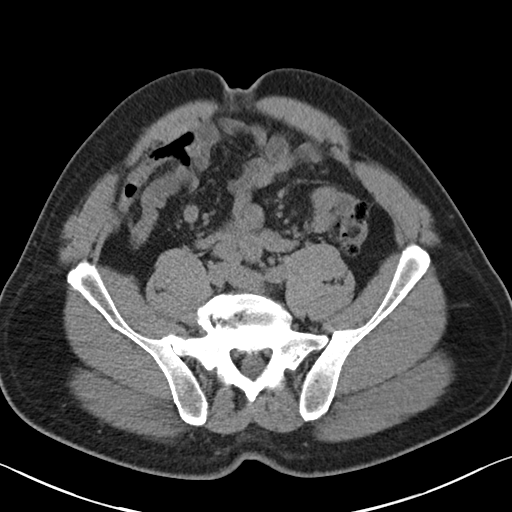
[im 45/84  soft-tissue]
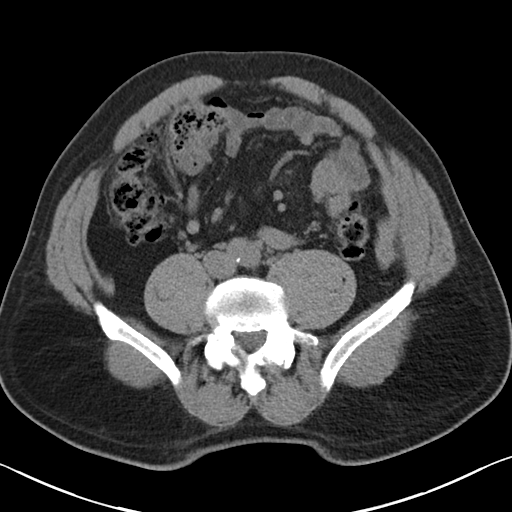
[im 52/84  soft-tissue]
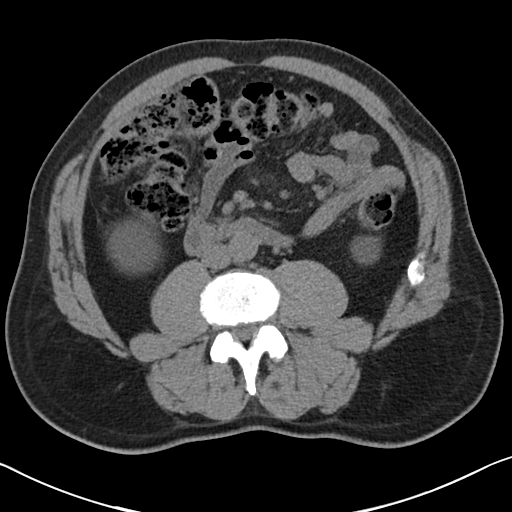
[im 59/84  soft-tissue]
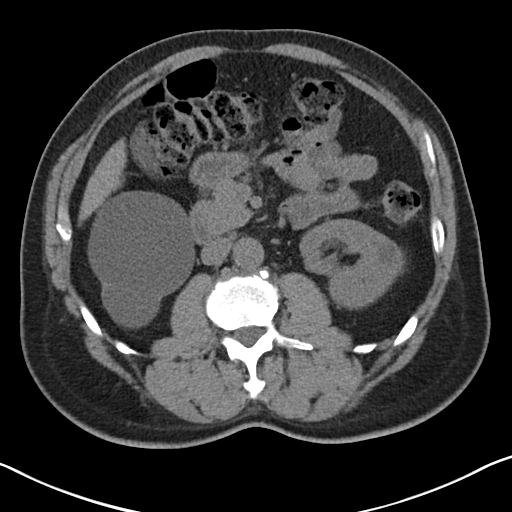
[im 59/84  bone]
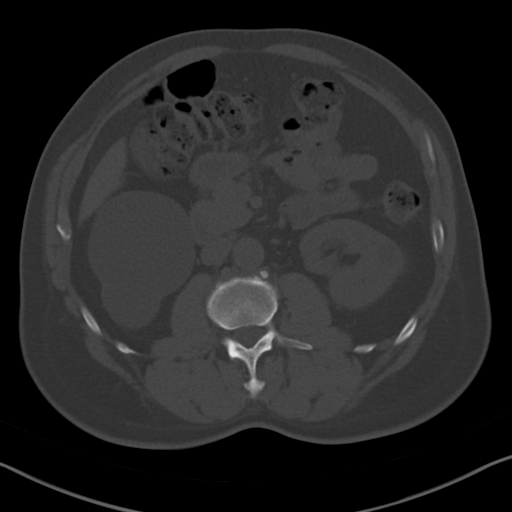
[im 66/84  soft-tissue]
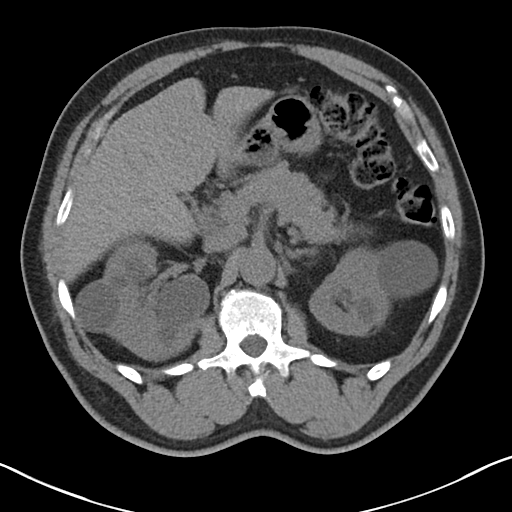
[im 73/84  soft-tissue]
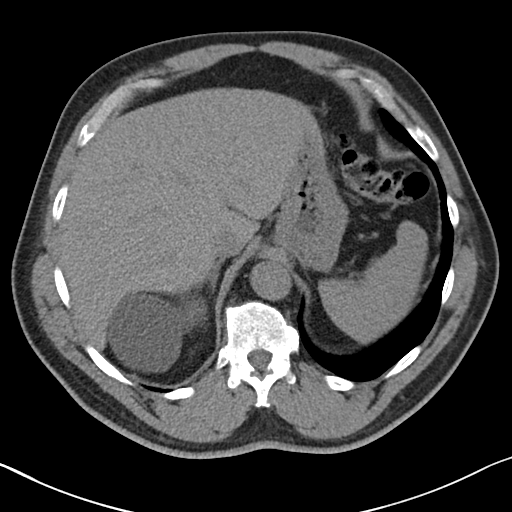
[im 80/84  soft-tissue]
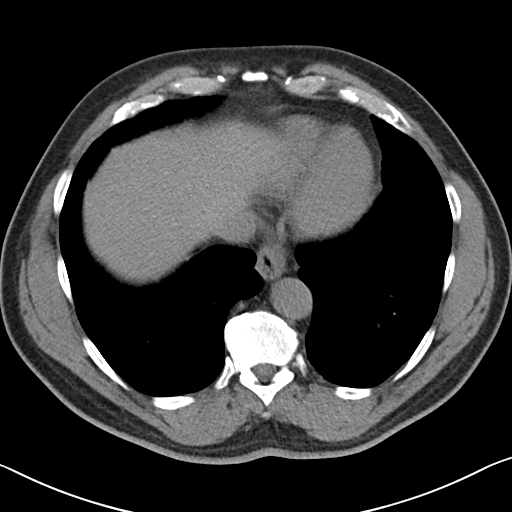

[Series 5: coronal soft tissue · coronal · 0.70mm/px · 3 of 91 slices shown]
[im 31/91  soft-tissue]
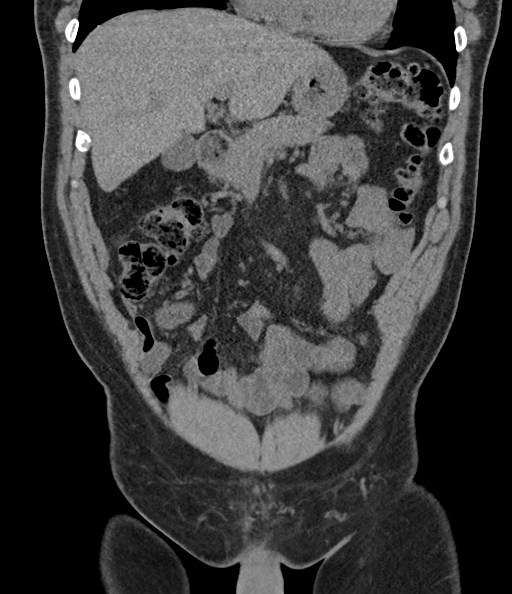
[im 41/91  soft-tissue]
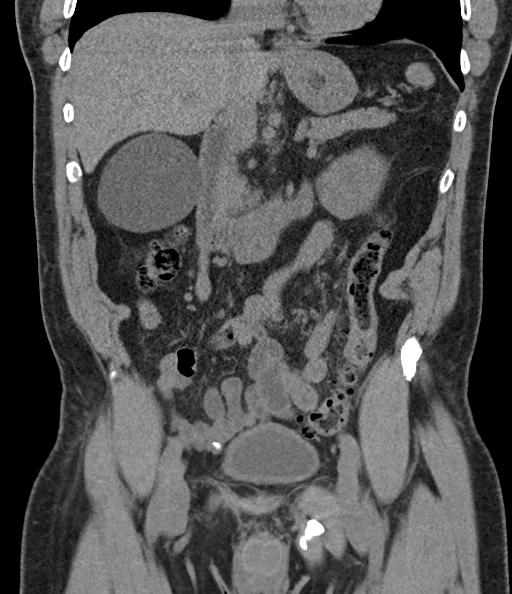
[im 51/91  soft-tissue]
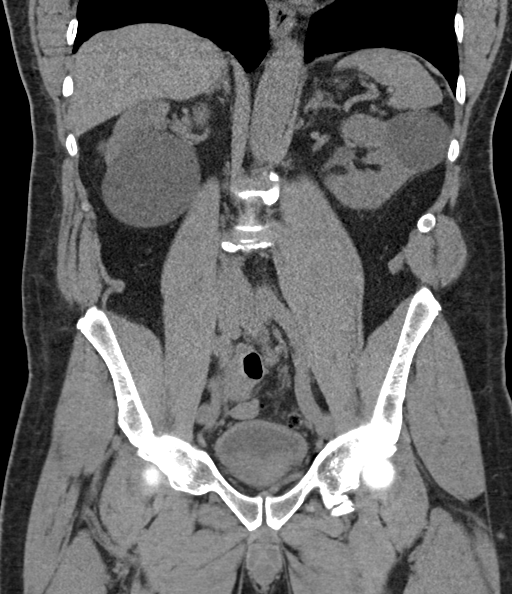

[15 of 46 positions shown; findings below may reference images not displayed]

FINDINGS: Lower chest: The lung bases are clear. Visualized cardiac structures
are within normal limits for size. No pericardial effusion. Small
hiatal hernia.

Hepatobiliary: Normal hepatic contour and morphology. No discrete
hepatic lesions. Normal appearance of the gallbladder. No intra or
extrahepatic biliary ductal dilatation.

Pancreas: Unremarkable. No pancreatic ductal dilatation or
surrounding inflammatory changes.

Spleen: Normal in size without focal abnormality.

Adrenals/Urinary Tract: Normal adrenal glands. Numerous
circumscribed low-attenuation cystic lesions present in both
kidneys. Although incompletely characterized in the absence of
intravenous contrast, these lesions are circumscribed and water
attenuation and most consistent with simple cysts. The largest on
the right is exophytic from the lower pole and measures up to 8 cm
in diameter. The largest on the left is exophytic from the
interpolar region and measures up to 4.4 cm in diameter. No evidence
of hydronephrosis or nephrolithiasis. Circumferential bladder wall
thickening likely reflects trabeculation from chronic bladder outlet
obstruction in the setting of marked prostatomegaly.

Stomach/Bowel: Colonic diverticular disease without CT evidence of
active inflammation. No focal bowel wall thickening or evidence of
obstruction.

Vascular/Lymphatic: Limited evaluation in the absence of intravenous
contrast. No evidence of aneurysm, significant atherosclerotic
plaque or suspicious lymphadenopathy.

Reproductive: Marked prostatomegaly. The prostate gland measures 6 x
6.7 x 5 cm (volume = 100 cm^3)

Other: No abdominal wall hernia or abnormality. No abdominopelvic
ascites.

Musculoskeletal: No acute fracture or aggressive appearing lytic or
blastic osseous lesion. Lower lumbar degenerative disc disease and
facet arthropathy.
IMPRESSION: 1. Marked prostatomegaly (estimated prostatic volume of 100 cm^3)
all with evidence of bladder wall trabeculation suggesting chronic
outlet obstruction. This may represent the source of the patient's
microscopic hematuria.
2. No evidence of hydronephrosis or nephrolithiasis.
3. Large bilateral renal cysts measuring up to 8 cm on the right.
This may represent the source of the patient's right flank pain.
Although incompletely imaged in the absence of intravenous contrast,
this cysts are circumscribed and water in attenuation and most
consistent with simple cysts. If this is felt to be the source of
the patient's pain, consider referral to Interventional Radiology
for ultrasound-guided cyst aspiration.
4. Small hiatal hernia.
5. Colonic diverticular disease without CT evidence of active
inflammation.

## 2017-08-19 ENCOUNTER — Ambulatory Visit (INDEPENDENT_AMBULATORY_CARE_PROVIDER_SITE_OTHER): Payer: Medicare HMO | Admitting: Family Medicine

## 2017-08-19 ENCOUNTER — Encounter: Payer: Self-pay | Admitting: Family Medicine

## 2017-08-19 VITALS — BP 130/84 | HR 71 | Temp 98.4°F | Wt 211.3 lb

## 2017-08-19 DIAGNOSIS — R109 Unspecified abdominal pain: Secondary | ICD-10-CM

## 2017-08-19 DIAGNOSIS — I129 Hypertensive chronic kidney disease with stage 1 through stage 4 chronic kidney disease, or unspecified chronic kidney disease: Secondary | ICD-10-CM | POA: Diagnosis not present

## 2017-08-19 DIAGNOSIS — N281 Cyst of kidney, acquired: Secondary | ICD-10-CM

## 2017-08-19 DIAGNOSIS — R3912 Poor urinary stream: Secondary | ICD-10-CM

## 2017-08-19 DIAGNOSIS — N401 Enlarged prostate with lower urinary tract symptoms: Secondary | ICD-10-CM | POA: Diagnosis not present

## 2017-08-19 DIAGNOSIS — N183 Chronic kidney disease, stage 3 unspecified: Secondary | ICD-10-CM

## 2017-08-19 NOTE — Progress Notes (Signed)
Subjective:     Patient ID: Jimmy Rice, male   DOB: 08-Apr-1946, 71 y.o.   MRN: [id number]  HPI Patient here for follow-up regarding recent right flank pain and to go over CT results. He presented with right flank pain which had been going on for the past several weeks. Microscopic hematuria. We sent for studies to rule out kidney stone. CT revealed marked prostate enlargement with bladder wall trabeculation suggesting chronic outlet obstruction. He has known BPH and basically has failed medications including finasteride had previous TURP- which helped temporarily.  Study otherwise revealed no evidence for hydronephrosis or kidney stones. He had large bilateral renal cyst measuring up to 8 cm on the right.: Diverticular disease without evidence for acute diverticulitis.  Patient continues to have slow urine stream and plans to see his urologist up in the [place] and has decided he would like to proceed with prostatectomy. He has known malignancy of the prostate and had been going observation route but would like to proceed with surgery because of his BPH symptoms. His right flank pain is fully resolved at this time. No gross hematuria. No fevers or chills.  Past Medical History:  Diagnosis Date  . Arthritis    "right knee" (07/22/2016)  . BENIGN PROSTATIC HYPERTROPHY, HX OF 04/04/2009  . Blood transfusion   . Childhood asthma   . Chronic kidney disease (CKD), stage III (moderate) (HCC)    cysts on kidneys, 3rd stage kidney disease  . Complication of anesthesia    woke up "fighting"  confusion  . GERD 01/18/2009  . HYPERTENSION 01/18/2009  . HYPOGONADISM 01/18/2009   Past Surgical History:  Procedure Laterality Date  . COLONOSCOPY    . INGUINAL HERNIA REPAIR Right 1997  . NASAL SINUS SURGERY  1997  . PROSTATE BIOPSY  X 5  . 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 TRANSURETHRAL RESECTION OF PROSTATE  12/2014    reports that he has quit smoking. His smoking use included  cigarettes. he has never used smokeless tobacco. He reports that he drinks about 0.6 oz of alcohol per week. He reports that he does not use drugs. family history includes Stomach cancer in his paternal aunt. Allergies  Allergen Reactions  . Flomax [Tamsulosin Hcl] Other (See Comments)    DIFFICULTY BREATHING NASAL STUFFINESS  . Amlodipine Hives     Review of Systems  Constitutional: Negative for chills and fever.  Cardiovascular: Negative for chest pain.  Gastrointestinal: Negative for abdominal pain.  Genitourinary: Positive for decreased urine volume and difficulty urinating. Negative for flank pain and hematuria.  Musculoskeletal: Negative for back pain.  Skin: Negative for rash.  Hematological: Negative for adenopathy.       Objective:   Physical Exam  Constitutional: He appears well-developed and well-nourished.  Cardiovascular: Normal rate and regular rhythm.  Pulmonary/Chest: Effort normal and breath sounds normal. No respiratory distress. He has no wheezes. He has no rales.  Musculoskeletal: He exhibits no edema.       Assessment:     #1 right flank pain resolved-  No evidence for stones.  ?musculoskeletal.  #2 BPH with bladder outlet obstruction-failed trial of medications.  Symptoms are chronic- not acute  #3 bilateral benign appearing renal cysts.  He will discuss with his nephrologist (he reports his father had "polycystic kidneys")      Plan:     -Patient plans to see his urologist up in [place]. Gave him a copy printed out of recent x-ray results which he  will take with him. -Follow-up immediately for any recurrent flank pain or other concerns  Eulas Post MD [facility] at [facility]

## 2017-08-20 DIAGNOSIS — N281 Cyst of kidney, acquired: Secondary | ICD-10-CM | POA: Insufficient documentation

## 2017-08-31 ENCOUNTER — Telehealth: Payer: Self-pay | Admitting: Family Medicine

## 2017-08-31 NOTE — Telephone Encounter (Signed)
Copied from [place]. Topic: General - Other >> Aug 31, 2017 10:50 AM Oneta Rack wrote: Relation to pt: self  Call back number: [phone]  Pharmacy:  Reason for call:  Patient was [facility] at the [facility] for high blood pressure  on Tuesday reading was 171/90 .   [facility]  amlodipine 10 mg take 1 tablet every day with a 30 day supply and 1 refill. Patient was advised to take note of readings for 1 week and then [facility]. Patient wanted to make PCP aware and will call back to schedule follow up appointment within a few weeks. Patient readings are as followed since he's been on the new medication, please advise  Wednesday 08/26/17 132/70   Thursday 08/27/17  144/75    Friday 08/28/17       133/66   Saturday 08/29/17   125/75 Sunday  08/30/17    135/82 Monday  08/31/17    122/72

## 2017-08-31 NOTE — Telephone Encounter (Signed)
Left message on machine for patient to return our call 

## 2017-08-31 NOTE — Telephone Encounter (Signed)
Continue current medication.  Seems to be working well.

## 2017-09-01 NOTE — Telephone Encounter (Signed)
Patient is aware 

## 2017-09-02 ENCOUNTER — Telehealth: Payer: Self-pay | Admitting: Family Medicine

## 2017-09-02 DIAGNOSIS — C61 Malignant neoplasm of prostate: Secondary | ICD-10-CM | POA: Diagnosis not present

## 2017-09-02 MED ORDER — AMLODIPINE BESYLATE 10 MG PO TABS: 10.0000 mg | ORAL_TABLET | Freq: Every day | ORAL | 3 refills | Status: DC

## 2017-09-02 NOTE — Telephone Encounter (Signed)
Copied from [place]. Topic: General - Other >> Sep 02, 2017  2:47 PM Darl Householder, RMA wrote: Reason for CRM: patient is requesting medication refill for amlodipine 10 mg to be sent to [facility] home  Patient also states that pharmacy is requesting for Dr. Elease Hashimoto or CMA to call at [phone] for approval

## 2017-09-02 NOTE — Telephone Encounter (Signed)
New Rx sent.

## 2017-09-07 ENCOUNTER — Telehealth: Payer: Self-pay | Admitting: Family Medicine

## 2017-09-07 DIAGNOSIS — R972 Elevated prostate specific antigen [PSA]: Secondary | ICD-10-CM

## 2017-09-07 DIAGNOSIS — C61 Malignant neoplasm of prostate: Secondary | ICD-10-CM

## 2017-09-07 NOTE — Telephone Encounter (Signed)
ok 

## 2017-09-07 NOTE — Telephone Encounter (Signed)
Okay to refer? 

## 2017-09-07 NOTE — Telephone Encounter (Signed)
Pt would like to have a referral to different urologist  [facility]   [phone].

## 2017-09-08 NOTE — Telephone Encounter (Signed)
Referral placed.

## 2017-09-18 DIAGNOSIS — N189 Chronic kidney disease, unspecified: Secondary | ICD-10-CM | POA: Diagnosis not present

## 2017-09-21 DIAGNOSIS — R69 Illness, unspecified: Secondary | ICD-10-CM | POA: Diagnosis not present

## 2017-09-23 ENCOUNTER — Ambulatory Visit (INDEPENDENT_AMBULATORY_CARE_PROVIDER_SITE_OTHER): Payer: Medicare HMO | Admitting: Family Medicine

## 2017-09-23 ENCOUNTER — Encounter: Payer: Self-pay | Admitting: Family Medicine

## 2017-09-23 VITALS — BP 120/70 | HR 96 | Temp 98.2°F | Wt 209.1 lb

## 2017-09-23 DIAGNOSIS — R600 Localized edema: Secondary | ICD-10-CM

## 2017-09-23 DIAGNOSIS — I10 Essential (primary) hypertension: Secondary | ICD-10-CM | POA: Diagnosis not present

## 2017-09-23 DIAGNOSIS — C61 Malignant neoplasm of prostate: Secondary | ICD-10-CM | POA: Diagnosis not present

## 2017-09-23 DIAGNOSIS — N401 Enlarged prostate with lower urinary tract symptoms: Secondary | ICD-10-CM | POA: Diagnosis not present

## 2017-09-23 NOTE — Patient Instructions (Signed)
Try reducing the Amlodipine to one half tablet daily Let me know in 2 weeks if not improved Elevate legs frequently Try to keep sodium < 2,000 mg per day.

## 2017-09-23 NOTE — Progress Notes (Signed)
Subjective:     Patient ID: Jimmy Rice, male   DOB: 1946-04-26, 71 y.o.   MRN: [id number]  HPI Patient seen with bilateral leg edema especially over the past couple weeks. He recently went to [facility] and they started amlodipine 10 mg daily. He does have some chronic kidney disease followed by nephrology. His edema is worse late in the day. No orthopnea. No chest pains. No dietary changes. He also had some occasional dull headache since starting amlodipine. He also takes furosemide 40 mg daily. No history of heart failure.  His weight is actually down 2 pounds from last visit here. He has not been doing daily weights.  Past Medical History:  Diagnosis Date  . Arthritis    "right knee" (07/22/2016)  . BENIGN PROSTATIC HYPERTROPHY, HX OF 04/04/2009  . Blood transfusion   . Childhood asthma   . Chronic kidney disease (CKD), stage III (moderate) (HCC)    cysts on kidneys, 3rd stage kidney disease  . Complication of anesthesia    woke up "fighting"  confusion  . GERD 01/18/2009  . HYPERTENSION 01/18/2009  . HYPOGONADISM 01/18/2009   Past Surgical History:  Procedure Laterality Date  . COLONOSCOPY    . INGUINAL HERNIA REPAIR Right 1997  . NASAL SINUS SURGERY  1997  . PARTIAL KNEE ARTHROPLASTY Right 07/22/2016   Procedure: UNICOMPARTMENTAL KNEE ARTHROPLASTY;  Surgeon: Renette Butters, MD;  Location: [facility];  Service: Orthopedics;  Laterality: Right;  . PROSTATE BIOPSY  X 5  . 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 TRANSURETHRAL RESECTION OF PROSTATE  12/2014    reports that he has quit smoking. His smoking use included cigarettes. he has never used smokeless tobacco. He reports that he drinks about 0.6 oz of alcohol per week. He reports that he does not use drugs. family history includes Stomach cancer in his paternal aunt. Allergies  Allergen Reactions  . Flomax [Tamsulosin Hcl] Other (See Comments)    DIFFICULTY BREATHING NASAL STUFFINESS  . Amlodipine  Hives     Review of Systems  Constitutional: Negative for fatigue.  Eyes: Negative for visual disturbance.  Respiratory: Negative for cough, chest tightness and shortness of breath.   Cardiovascular: Positive for leg swelling. Negative for chest pain and palpitations.  Neurological: Negative for dizziness, syncope, weakness, light-headedness and headaches.       Objective:   Physical Exam  Constitutional: He is oriented to person, place, and time. He appears well-developed and well-nourished.  HENT:  Right Ear: External ear normal.  Left Ear: External ear normal.  Mouth/Throat: Oropharynx is clear and moist.  Eyes: Pupils are equal, round, and reactive to light.  Neck: Neck supple. No thyromegaly present.  Cardiovascular: Normal rate and regular rhythm.  Pulmonary/Chest: Effort normal and breath sounds normal. No respiratory distress. He has no wheezes. He has no rales.  Musculoskeletal: He exhibits edema.  He has 1+ pitting edema ankles and lower legs bilaterally  Neurological: He is alert and oriented to person, place, and time.       Assessment:     #1 hypertension stable and at goal  #2 bilateral leg edema worsened after starting amlodipine 10 mg daily and suspect this is larger related to medication  #3 history of prostate cancer    Plan:     -Try reducing amlodipine to 5 mg daily and monitor blood pressure closely-be in touch if climbing up > 140/90. -Elevate legs frequently -Try to keep sodium intake less than 2000 mg  daily -Touch base in 2 weeks if edema not improving  Eulas Post MD [facility]

## 2017-10-14 DIAGNOSIS — N401 Enlarged prostate with lower urinary tract symptoms: Secondary | ICD-10-CM | POA: Diagnosis not present

## 2017-10-14 DIAGNOSIS — N139 Obstructive and reflux uropathy, unspecified: Secondary | ICD-10-CM | POA: Diagnosis not present

## 2017-10-14 DIAGNOSIS — R339 Retention of urine, unspecified: Secondary | ICD-10-CM | POA: Diagnosis not present

## 2017-11-06 DIAGNOSIS — N401 Enlarged prostate with lower urinary tract symptoms: Secondary | ICD-10-CM | POA: Diagnosis not present

## 2017-11-06 DIAGNOSIS — C61 Malignant neoplasm of prostate: Secondary | ICD-10-CM | POA: Diagnosis not present

## 2017-11-13 DIAGNOSIS — N401 Enlarged prostate with lower urinary tract symptoms: Secondary | ICD-10-CM | POA: Diagnosis not present

## 2017-11-24 ENCOUNTER — Ambulatory Visit (INDEPENDENT_AMBULATORY_CARE_PROVIDER_SITE_OTHER): Payer: Medicare HMO | Admitting: Family Medicine

## 2017-11-24 ENCOUNTER — Encounter: Payer: Self-pay | Admitting: Family Medicine

## 2017-11-24 VITALS — BP 110/70 | HR 92 | Temp 98.2°F | Wt 210.4 lb

## 2017-11-24 DIAGNOSIS — I10 Essential (primary) hypertension: Secondary | ICD-10-CM | POA: Diagnosis not present

## 2017-11-24 DIAGNOSIS — R3912 Poor urinary stream: Secondary | ICD-10-CM | POA: Diagnosis not present

## 2017-11-24 DIAGNOSIS — N183 Chronic kidney disease, stage 3 unspecified: Secondary | ICD-10-CM

## 2017-11-24 DIAGNOSIS — G5622 Lesion of ulnar nerve, left upper limb: Secondary | ICD-10-CM

## 2017-11-24 DIAGNOSIS — N401 Enlarged prostate with lower urinary tract symptoms: Secondary | ICD-10-CM | POA: Diagnosis not present

## 2017-11-24 LAB — BASIC METABOLIC PANEL
BUN: 22 mg/dL (ref 6–23)
CHLORIDE: 101 meq/L (ref 96–112)
CO2: 30 mEq/L (ref 19–32)
Calcium: 9.6 mg/dL (ref 8.4–10.5)
Creatinine, Ser: 1.86 mg/dL — ABNORMAL HIGH (ref 0.40–1.50)
GFR: 46.14 mL/min — AB (ref >60.00)
Glucose, Bld: 104 mg/dL — ABNORMAL HIGH (ref 70–99)
POTASSIUM: 4.1 meq/L (ref 3.5–5.1)
SODIUM: 138 meq/L (ref 135–145)

## 2017-11-24 MED ORDER — LOSARTAN POTASSIUM-HCTZ 100-12.5 MG PO TABS: 1.0000 | ORAL_TABLET | Freq: Every day | ORAL | 3 refills | Status: DC

## 2017-11-24 NOTE — Progress Notes (Signed)
Subjective:     Patient ID: Jimmy Rice, male   DOB: 1945/11/04, 72 y.o.   MRN: [id number]  HPI Patient has chronic prompt including history of hypertension, stage III chronic kidney disease, GERD, BPH, prostate cancer. He had recent procedure and probably [place] for his BPH which was called a "Urolift"  he states that went well and has been out of urinate much better since the procedure.  He has history of chronic kidney disease and has not had any recent renal profile. His blood pressure is treated with amlodipine and losartan HCTZ. Recent blood pressure been stable. No dizziness. No headaches. No chest pains.  Major complaint is 4-5 month history of progressive numbness and pain left fifth digit and ulnar half of the fourth digit. He has some pain which radiates from the left shoulder down to the hand. His pain is 4-5 out of 10 intensity and constant. No exacerbating or alleviating factors. He is also developing some weakness. For example, when he is playing golf he noted difficulty with the club  Denies any right upper extremity symptoms. He is right-hand dominant.  Past Medical History:  Diagnosis Date  . Arthritis    "right knee" (07/22/2016)  . BENIGN PROSTATIC HYPERTROPHY, HX OF 04/04/2009  . Blood transfusion   . Childhood asthma   . Chronic kidney disease (CKD), stage III (moderate) (HCC)    cysts on kidneys, 3rd stage kidney disease  . Complication of anesthesia    woke up "fighting"  confusion  . GERD 01/18/2009  . HYPERTENSION 01/18/2009  . HYPOGONADISM 01/18/2009   Past Surgical History:  Procedure Laterality Date  . COLONOSCOPY    . INGUINAL HERNIA REPAIR Right 1997  . NASAL SINUS SURGERY  1997  . PARTIAL KNEE ARTHROPLASTY Right 07/22/2016   Procedure: UNICOMPARTMENTAL KNEE ARTHROPLASTY;  Surgeon: Renette Butters, MD;  Location: [place];  Service: Orthopedics;  Laterality: Right;  . PROSTATE BIOPSY  X 5  . REPLACEMENT UNICONDYLAR JOINT KNEE Right 07/22/2016   . TRANSURETHRAL RESECTION OF PROSTATE  12/2014    reports that he has quit smoking. His smoking use included cigarettes. he has never used smokeless tobacco. He reports that he drinks about 0.6 oz of alcohol per week. He reports that he does not use drugs. family history includes Stomach cancer in his paternal aunt. Allergies  Allergen Reactions  . Flomax [Tamsulosin Hcl] Other (See Comments)    DIFFICULTY BREATHING NASAL STUFFINESS  . Amlodipine Hives     Review of Systems  Constitutional: Negative for fatigue.  Eyes: Negative for visual disturbance.  Respiratory: Negative for cough, chest tightness and shortness of breath.   Cardiovascular: Negative for chest pain, palpitations and leg swelling.  Endocrine: Negative for polydipsia and polyuria.  Neurological: Positive for weakness and numbness. Negative for dizziness, syncope, light-headedness and headaches.  Hematological: Negative for adenopathy.       Objective:   Physical Exam  Constitutional: He is oriented to person, place, and time. He appears well-developed and well-nourished.  HENT:  Right Ear: External ear normal.  Left Ear: External ear normal.  Mouth/Throat: Oropharynx is clear and moist.  Eyes: Pupils are equal, round, and reactive to light.  Neck: Neck supple. No thyromegaly present.  Cardiovascular: Normal rate and regular rhythm.  Pulmonary/Chest: Effort normal and breath sounds normal. No respiratory distress. He has no wheezes. He has no rales.  Musculoskeletal: He exhibits no edema.  Full range of motion left elbow. No tenderness around the elbow region.  Neurological: He is alert and oriented to person, place, and time.  Patient has some sensory impairment touch left fifth digit and Ulnar half of the fourth digit. He has weakness with testing interosseous muscles and also with opposition of thumb to fifth digit  No visible interosseous muscle atrophy       Assessment:     -#1 left upper extremity  ulnar neuropathy-he does describe some elbow pain which may represent entrapment at the elbow region but also has some neck pains intermittently. Difficult to sort out from history source of his neuropathy.  #2 hypertension stable and at goal  #3 history of BPH symptomatically improved following urologic procedure  #4 history of prostate cancer    Plan:     -Recheck basic metabolic panel -Set up nerve conduction velocities left upper extremity-hopefully this can help guide whether to look further at cervical spine versus referral to hand surgeon  Eulas Post MD [facility]

## 2017-11-24 NOTE — Patient Instructions (Signed)
We will set up nerve conduction velocities of left upper extremity.

## 2017-11-25 ENCOUNTER — Other Ambulatory Visit: Payer: Self-pay | Admitting: Family Medicine

## 2017-11-25 DIAGNOSIS — N183 Chronic kidney disease, stage 3 unspecified: Secondary | ICD-10-CM

## 2017-12-03 ENCOUNTER — Telehealth: Payer: Self-pay | Admitting: Family Medicine

## 2017-12-03 NOTE — Telephone Encounter (Signed)
Patient states that he should be getting a referral per his last visit x1 week ago with Dr Elease Hashimoto.  Patient is requesting a call back because he hasn't heard anything yet.

## 2017-12-07 ENCOUNTER — Encounter: Payer: Self-pay | Admitting: Neurology

## 2017-12-08 ENCOUNTER — Other Ambulatory Visit: Payer: Self-pay | Admitting: *Deleted

## 2017-12-08 DIAGNOSIS — G629 Polyneuropathy, unspecified: Secondary | ICD-10-CM

## 2017-12-10 DIAGNOSIS — I129 Hypertensive chronic kidney disease with stage 1 through stage 4 chronic kidney disease, or unspecified chronic kidney disease: Secondary | ICD-10-CM | POA: Diagnosis not present

## 2017-12-10 DIAGNOSIS — Z789 Other specified health status: Secondary | ICD-10-CM | POA: Diagnosis not present

## 2017-12-10 DIAGNOSIS — Q612 Polycystic kidney, adult type: Secondary | ICD-10-CM | POA: Diagnosis not present

## 2017-12-10 DIAGNOSIS — N183 Chronic kidney disease, stage 3 (moderate): Secondary | ICD-10-CM | POA: Diagnosis not present

## 2017-12-10 DIAGNOSIS — Z8271 Family history of polycystic kidney: Secondary | ICD-10-CM | POA: Diagnosis not present

## 2017-12-11 DIAGNOSIS — C61 Malignant neoplasm of prostate: Secondary | ICD-10-CM | POA: Diagnosis not present

## 2017-12-14 DIAGNOSIS — I771 Stricture of artery: Secondary | ICD-10-CM | POA: Diagnosis not present

## 2017-12-14 DIAGNOSIS — Z789 Other specified health status: Secondary | ICD-10-CM | POA: Diagnosis not present

## 2017-12-14 DIAGNOSIS — Z8271 Family history of polycystic kidney: Secondary | ICD-10-CM | POA: Diagnosis not present

## 2017-12-14 DIAGNOSIS — I129 Hypertensive chronic kidney disease with stage 1 through stage 4 chronic kidney disease, or unspecified chronic kidney disease: Secondary | ICD-10-CM | POA: Diagnosis not present

## 2017-12-18 DIAGNOSIS — N401 Enlarged prostate with lower urinary tract symptoms: Secondary | ICD-10-CM | POA: Diagnosis not present

## 2017-12-18 DIAGNOSIS — C61 Malignant neoplasm of prostate: Secondary | ICD-10-CM | POA: Diagnosis not present

## 2017-12-22 ENCOUNTER — Ambulatory Visit (INDEPENDENT_AMBULATORY_CARE_PROVIDER_SITE_OTHER): Payer: Medicare HMO | Admitting: Neurology

## 2017-12-22 DIAGNOSIS — G5622 Lesion of ulnar nerve, left upper limb: Secondary | ICD-10-CM

## 2017-12-22 NOTE — Procedures (Signed)
[facility]  [address]  [place], [name] [address] Tel: [phone] Fax:  [phone] Test Date:  12/22/2017  Patient: Jimmy Rice DOB: Apr 10, 1946 Physician: Narda Amber, DO  Sex: Male   Ref Phys: Carolann Littler, MD  ID#: [id number] Temp: 33.8C Technician:    Patient Complaints: This is 72 year-old man referred for evaluation of left hand paresthesias, worse over the last 2 digits.  NCV & EMG Findings: Extensive electrodiagnostic testing of the left upper extremity shows:  1. Left ulnar sensory response shows prolonged latency (3.9 ms).  Left median and mixed palmar sensory responses are within normal limits. 2. Left ulnar motor response shows prolonged distal onset latency (3.3 ms), reduced amplitude (5.5 mV), and decreased conduction velocity (A Elbow-B Elbow, 40 m/s).  Left median motor responses within normal limits.  3. Chronic motor axon loss changes are seen affecting the ulnar innervated muscles, without accompanied active denervation.   Impression: Left ulnar neuropathy with slowing across the elbow, axon loss and demyelinating in type. These findings are moderate in degree electrically.   ___________________________ Narda Amber, DO    Nerve Conduction Studies Anti Sensory Summary Table   Site NR Peak (ms) Norm Peak (ms) P-T Amp (V) Norm P-T Amp  Left Median Anti Sensory (2nd Digit)  33.8C  Wrist    3.5 <3.8 18.1 >10  Left Ulnar Anti Sensory (5th Digit)  33.8C  Wrist    3.9 <3.2 14.2 >5   Motor Summary Table   Site NR Onset (ms) Norm Onset (ms) O-P Amp (mV) Norm O-P Amp Site1 Site2 Delta-0 (ms) Dist (cm) Vel (m/s) Norm Vel (m/s)  Left Median Motor (Abd Poll Brev)  33.8C  Wrist    3.5 <4.0 12.2 >5 Elbow Wrist 5.7 34.0 60 >50  Elbow    9.2  11.4         Left Ulnar Motor (Abd Dig Minimi)  33.8C  Wrist    3.3 <3.1 5.5 >7 B Elbow Wrist 4.0 26.0 65 >50  B Elbow    7.3  4.2  A Elbow B Elbow 2.5 10.0 40 >50  A Elbow    9.8  4.0           Comparison Summary Table   Site NR Peak (ms) Norm Peak (ms) P-T Amp (V) Site1 Site2 Delta-P (ms) Norm Delta (ms)  Left Median/Ulnar Palm Comparison (Wrist - 8cm)  33.8C  Median Palm    1.9 <2.2 33.9 Median Palm Ulnar Palm 0.1   Ulnar Palm    1.8 <2.2 8.0       EMG   Side Muscle Ins Act Fibs Psw Fasc Number Recrt Dur Dur. Amp Amp. Poly Poly. Comment  Left 1stDorInt Nml Nml Nml Nml 2- Rapid Many 1+ Many 1+ Many 1+ N/A  Left Ext Indicis Nml Nml Nml Nml Nml Nml Nml Nml Nml Nml Nml Nml N/A  Left PronatorTeres Nml Nml Nml Nml Nml Nml Nml Nml Nml Nml Nml Nml N/A  Left ABD Dig Min Nml Nml Nml Nml 2- Rapid Many 1+ Many 1+ Many 1+ N/A  Left Biceps Nml Nml Nml Nml Nml Nml Nml Nml Nml Nml Nml Nml N/A  Left Triceps Nml Nml Nml Nml Nml Nml Nml Nml Nml Nml Nml Nml N/A  Left Deltoid Nml Nml Nml Nml Nml Nml Nml Nml Nml Nml Nml Nml N/A  Left FlexCarpiUln Nml Nml Nml Nml 1- Rapid Few 1+ Few 1+ Few 1+ N/A  Waveforms:

## 2017-12-23 ENCOUNTER — Other Ambulatory Visit: Payer: Medicare HMO

## 2018-01-04 ENCOUNTER — Encounter: Payer: Self-pay | Admitting: *Deleted

## 2018-01-05 ENCOUNTER — Other Ambulatory Visit (INDEPENDENT_AMBULATORY_CARE_PROVIDER_SITE_OTHER): Payer: Medicare HMO

## 2018-01-05 DIAGNOSIS — N183 Chronic kidney disease, stage 3 unspecified: Secondary | ICD-10-CM

## 2018-01-05 LAB — BASIC METABOLIC PANEL
BUN: 20 mg/dL (ref 6–23)
CALCIUM: 9.4 mg/dL (ref 8.4–10.5)
CO2: 30 meq/L (ref 19–32)
Chloride: 101 mEq/L (ref 96–112)
Creatinine, Ser: 1.65 mg/dL — ABNORMAL HIGH (ref 0.40–1.50)
GFR: 52.97 mL/min — ABNORMAL LOW (ref >60.00)
GLUCOSE: 99 mg/dL (ref 70–99)
Potassium: 3.8 mEq/L (ref 3.5–5.1)
Sodium: 137 mEq/L (ref 135–145)

## 2018-01-12 ENCOUNTER — Telehealth: Payer: Self-pay | Admitting: Family Medicine

## 2018-01-12 DIAGNOSIS — G5620 Lesion of ulnar nerve, unspecified upper limb: Secondary | ICD-10-CM

## 2018-01-12 NOTE — Telephone Encounter (Signed)
Patient is requesting a call back about his Losartan/HCTZ being on a recall.  He is concerned about taking it.

## 2018-01-13 NOTE — Telephone Encounter (Signed)
Spoke with patient and he has spoken with his pharmacist and his medication do not need to be changed.  Patient agreed to ortho referral.  Referral placed.

## 2018-02-04 DIAGNOSIS — Z9889 Other specified postprocedural states: Secondary | ICD-10-CM | POA: Diagnosis not present

## 2018-02-04 DIAGNOSIS — C61 Malignant neoplasm of prostate: Secondary | ICD-10-CM | POA: Diagnosis not present

## 2018-02-10 DIAGNOSIS — C61 Malignant neoplasm of prostate: Secondary | ICD-10-CM | POA: Diagnosis not present

## 2018-02-26 ENCOUNTER — Telehealth: Payer: Self-pay | Admitting: Family Medicine

## 2018-02-26 ENCOUNTER — Other Ambulatory Visit: Payer: Self-pay | Admitting: *Deleted

## 2018-02-26 MED ORDER — LOSARTAN POTASSIUM-HCTZ 100-12.5 MG PO TABS: 1.0000 | ORAL_TABLET | Freq: Every day | ORAL | 2 refills | Status: DC

## 2018-02-26 NOTE — Telephone Encounter (Signed)
Copied from [place] [id number]. Topic: Quick Communication - Rx Refill/Question >> Feb 26, 2018  2:06 PM Synthia Innocent wrote: Medication: losartan-hydrochlorothiazide (HYZAAR) 100-12.5 MG tablet  Has the patient contacted their pharmacy? Yes.  New pharmacy (Agent: If no, request that the patient contact the pharmacy for the refill.) (Agent: If yes, when and what did the pharmacy advise?)  Preferred Pharmacy (with phone number or street name): [facility]   Agent: Please be advised that RX refills may take up to 3 business days. We ask that you follow-up with your pharmacy.

## 2018-03-18 DIAGNOSIS — R69 Illness, unspecified: Secondary | ICD-10-CM | POA: Diagnosis not present

## 2018-05-19 ENCOUNTER — Ambulatory Visit (INDEPENDENT_AMBULATORY_CARE_PROVIDER_SITE_OTHER): Payer: Medicare HMO | Admitting: Family Medicine

## 2018-05-19 ENCOUNTER — Ambulatory Visit (INDEPENDENT_AMBULATORY_CARE_PROVIDER_SITE_OTHER): Payer: Medicare HMO

## 2018-05-19 ENCOUNTER — Encounter: Payer: Self-pay | Admitting: Family Medicine

## 2018-05-19 VITALS — BP 110/70 | Ht 71.0 in | Wt 200.0 lb

## 2018-05-19 VITALS — BP 110/70 | HR 92 | Temp 97.8°F | Ht 71.0 in | Wt 200.7 lb

## 2018-05-19 DIAGNOSIS — R5383 Other fatigue: Secondary | ICD-10-CM | POA: Diagnosis not present

## 2018-05-19 DIAGNOSIS — Z0000 Encounter for general adult medical examination without abnormal findings: Secondary | ICD-10-CM

## 2018-05-19 DIAGNOSIS — Z0001 Encounter for general adult medical examination with abnormal findings: Secondary | ICD-10-CM

## 2018-05-19 DIAGNOSIS — R739 Hyperglycemia, unspecified: Secondary | ICD-10-CM | POA: Diagnosis not present

## 2018-05-19 DIAGNOSIS — I10 Essential (primary) hypertension: Secondary | ICD-10-CM

## 2018-05-19 DIAGNOSIS — C61 Malignant neoplasm of prostate: Secondary | ICD-10-CM

## 2018-05-19 DIAGNOSIS — N183 Chronic kidney disease, stage 3 unspecified: Secondary | ICD-10-CM

## 2018-05-19 DIAGNOSIS — E785 Hyperlipidemia, unspecified: Secondary | ICD-10-CM

## 2018-05-19 LAB — CBC WITH DIFFERENTIAL/PLATELET
BASOS PCT: 1.2 % (ref 0.0–3.0)
Basophils Absolute: 0 10*3/uL (ref 0.0–0.1)
EOS ABS: 0.3 10*3/uL (ref 0.0–0.7)
Eosinophils Relative: 8.7 % — ABNORMAL HIGH (ref 0.0–5.0)
HEMATOCRIT: 40.2 % (ref 39.0–52.0)
HEMOGLOBIN: 13.3 g/dL (ref 13.0–17.0)
LYMPHS PCT: 42.5 % (ref 12.0–46.0)
Lymphs Abs: 1.6 10*3/uL (ref 0.7–4.0)
MCHC: 33.1 g/dL (ref 30.0–36.0)
MCV: 90.2 fl (ref 78.0–100.0)
Monocytes Absolute: 0.3 10*3/uL (ref 0.1–1.0)
Monocytes Relative: 8.7 % (ref 3.0–12.0)
Neutro Abs: 1.5 10*3/uL (ref 1.4–7.7)
Neutrophils Relative %: 38.9 % — ABNORMAL LOW (ref 43.0–77.0)
Platelets: 204 10*3/uL (ref 150.0–400.0)
RBC: 4.46 Mil/uL (ref 4.22–5.81)
RDW: 12.8 % (ref 11.5–15.5)
WBC: 3.8 10*3/uL — AB (ref 4.0–10.5)

## 2018-05-19 LAB — LIPID PANEL
Cholesterol: 188 mg/dL (ref 0–200)
HDL: 43.9 mg/dL (ref >39.00)
NonHDL: 143.63
Total CHOL/HDL Ratio: 4
Triglycerides: 229 mg/dL — ABNORMAL HIGH (ref 0.0–149.0)
VLDL: 45.8 mg/dL — AB (ref 0.0–40.0)

## 2018-05-19 LAB — PSA: PSA: 7.41 ng/mL — ABNORMAL HIGH (ref 0.10–4.00)

## 2018-05-19 LAB — BASIC METABOLIC PANEL
BUN: 22 mg/dL (ref 6–23)
CALCIUM: 10.1 mg/dL (ref 8.4–10.5)
CO2: 31 mEq/L (ref 19–32)
CREATININE: 1.85 mg/dL — AB (ref 0.40–1.50)
Chloride: 103 mEq/L (ref 96–112)
GFR: 46.37 mL/min — AB (ref >60.00)
GLUCOSE: 121 mg/dL — AB (ref 70–99)
POTASSIUM: 4.1 meq/L (ref 3.5–5.1)
Sodium: 141 mEq/L (ref 135–145)

## 2018-05-19 LAB — HEPATIC FUNCTION PANEL
ALK PHOS: 68 U/L (ref 39–117)
ALT: 15 U/L (ref 0–53)
AST: 14 U/L (ref 0–37)
Albumin: 4.3 g/dL (ref 3.5–5.2)
BILIRUBIN DIRECT: 0.1 mg/dL (ref 0.0–0.3)
TOTAL PROTEIN: 7.6 g/dL (ref 6.0–8.3)
Total Bilirubin: 0.7 mg/dL (ref 0.2–1.2)

## 2018-05-19 LAB — HEMOGLOBIN A1C: Hgb A1c MFr Bld: 5.5 % (ref 4.6–6.5)

## 2018-05-19 LAB — LDL CHOLESTEROL, DIRECT: LDL DIRECT: 106 mg/dL

## 2018-05-19 LAB — TSH: TSH: 2.51 u[IU]/mL (ref 0.35–4.50)

## 2018-05-19 NOTE — Patient Instructions (Addendum)
Mr. Laymon , Thank you for taking time to come for your Medicare Wellness Visit. I appreciate your ongoing commitment to your health goals. Please review the following plan we discussed and let me know if I can assist you in the future.   A Tetanus is recommended every 10 years. Medicare covers a tetanus if you have a cut or wound; otherwise, there may be a charge. If you had not had a tetanus with pertusses, known as the Tdap, you can take this anytime.   Shingrix is a vaccine for the prevention of Shingles in Adults 50 and older.  If you are on Medicare, the shingrix is covered under your Part D plan, so you will take both of the vaccines in the series at your pharmacy. Please check with your benefits regarding applicable copays or out of pocket expenses.  The Shingrix is given in 2 vaccines approx 8 weeks apart. You must receive the 2nd dose prior to 6 months from receipt of the first. Please have the pharmacist print out you Immunization  dates for our office records     These are the goals we discussed: Goals    . Patient Stated     Wants to get back to the gym Early am or after work or weekends - stopped x 6 months  Start back swimming   Moving back to [place]; still has a place in [place]  When settled in home in [place], you will go back to the gym  Continue to play golf everyday!       This is a list of the screening recommended for you and due dates:  Health Maintenance  Topic Date Due  . Tetanus Vaccine  10/07/2015  . Flu Shot  05/06/2018  . Colon Cancer Screening  06/24/2022  .  Hepatitis C: One time screening is recommended by Center for Disease Control  (CDC) for  adults born from [birth year] through 1965.   Completed  . Pneumonia vaccines  Completed      Fall Prevention in the Home Falls can cause injuries. They can happen to people of all ages. There are many things you can do to make your home safe and to help prevent falls. What can I do on the outside of  my home?  Regularly fix the edges of walkways and driveways and fix any cracks.  Remove anything that might make you trip as you walk through a door, such as a raised step or threshold.  Trim any bushes or trees on the path to your home.  Use bright outdoor lighting.  Clear any walking paths of anything that might make someone trip, such as rocks or tools.  Regularly check to see if handrails are loose or broken. Make sure that both sides of any steps have handrails.  Any raised decks and porches should have guardrails on the edges.  Have any leaves, snow, or ice cleared regularly.  Use sand or salt on walking paths during winter.  Clean up any spills in your garage right away. This includes oil or grease spills. What can I do in the bathroom?  Use night lights.  Install grab bars by the toilet and in the tub and shower. Do not use towel bars as grab bars.  Use non-skid mats or decals in the tub or shower.  If you need to sit down in the shower, use a plastic, non-slip stool.  Keep the floor dry. Clean up any water that spills on the floor as  soon as it happens.  Remove soap buildup in the tub or shower regularly.  Attach bath mats securely with double-sided non-slip rug tape.  Do not have throw rugs and other things on the floor that can make you trip. What can I do in the bedroom?  Use night lights.  Make sure that you have a light by your bed that is easy to reach.  Do not use any sheets or blankets that are too big for your bed. They should not hang down onto the floor.  Have a firm chair that has side arms. You can use this for support while you get dressed.  Do not have throw rugs and other things on the floor that can make you trip. What can I do in the kitchen?  Clean up any spills right away.  Avoid walking on wet floors.  Keep items that you use a lot in easy-to-reach places.  If you need to reach something above you, use a strong step stool that has  a grab bar.  Keep electrical cords out of the way.  Do not use floor polish or wax that makes floors slippery. If you must use wax, use non-skid floor wax.  Do not have throw rugs and other things on the floor that can make you trip. What can I do with my stairs?  Do not leave any items on the stairs.  Make sure that there are handrails on both sides of the stairs and use them. Fix handrails that are broken or loose. Make sure that handrails are as long as the stairways.  Check any carpeting to make sure that it is firmly attached to the stairs. Fix any carpet that is loose or worn.  Avoid having throw rugs at the top or bottom of the stairs. If you do have throw rugs, attach them to the floor with carpet tape.  Make sure that you have a light switch at the top of the stairs and the bottom of the stairs. If you do not have them, ask someone to add them for you. What else can I do to help prevent falls?  Wear shoes that: ? Do not have high heels. ? Have rubber bottoms. ? Are comfortable and fit you well. ? Are closed at the toe. Do not wear sandals.  If you use a stepladder: ? Make sure that it is fully opened. Do not climb a closed stepladder. ? Make sure that both sides of the stepladder are locked into place. ? Ask someone to hold it for you, if possible.  Clearly mark and make sure that you can see: ? Any grab bars or handrails. ? First and last steps. ? Where the edge of each step is.  Use tools that help you move around (mobility aids) if they are needed. These include: ? Canes. ? Walkers. ? Scooters. ? Crutches.  Turn on the lights when you go into a dark area. Replace any light bulbs as soon as they burn out.  Set up your furniture so you have a clear path. Avoid moving your furniture around.  If any of your floors are uneven, fix them.  If there are any pets around you, be aware of where they are.  Review your medicines with your doctor. Some medicines can  make you feel dizzy. This can increase your chance of falling. Ask your doctor what other things that you can do to help prevent falls. This information is not intended to replace advice given  to you by your health care provider. Make sure you discuss any questions you have with your health care provider. Document Released: 07/19/2009 Document Revised: 02/28/2016 Document Reviewed: 10/27/2014 Elsevier Interactive Patient Education  2018 [place] Maintenance, Male A healthy lifestyle and preventive care is important for your health and wellness. Ask your health care provider about what schedule of regular examinations is right for you. What should I know about weight and diet? Eat a Healthy Diet  Eat plenty of vegetables, fruits, whole grains, low-fat dairy products, and lean protein.  Do not eat a lot of foods high in solid fats, added sugars, or salt.  Maintain a Healthy Weight Regular exercise can help you achieve or maintain a healthy weight. You should:  Do at least 150 minutes of exercise each week. The exercise should increase your heart rate and make you sweat (moderate-intensity exercise).  Do strength-training exercises at least twice a week.  Watch Your Levels of Cholesterol and Blood Lipids  Have your blood tested for lipids and cholesterol every 5 years starting at 72 years of age. If you are at high risk for heart disease, you should start having your blood tested when you are 72 years old. You may need to have your cholesterol levels checked more often if: ? Your lipid or cholesterol levels are high. ? You are older than 72 years of age. ? You are at high risk for heart disease.  What should I know about cancer screening? Many types of cancers can be detected early and may often be prevented. Lung Cancer  You should be screened every year for lung cancer if: ? You are a current smoker who has smoked for at least 30 years. ? You are a former smoker who  has quit within the past 15 years.  Talk to your health care provider about your screening options, when you should start screening, and how often you should be screened.  Colorectal Cancer  Routine colorectal cancer screening usually begins at 72 years of age and should be repeated every 5-10 years until you are 72 years old. You may need to be screened more often if early forms of precancerous polyps or small growths are found. Your health care provider may recommend screening at an earlier age if you have risk factors for colon cancer.  Your health care provider may recommend using home test kits to check for hidden blood in the stool.  A small camera at the end of a tube can be used to examine your colon (sigmoidoscopy or colonoscopy). This checks for the earliest forms of colorectal cancer.  Prostate and Testicular Cancer  Depending on your age and overall health, your health care provider may do certain tests to screen for prostate and testicular cancer.  Talk to your health care provider about any symptoms or concerns you have about testicular or prostate cancer.  Skin Cancer  Check your skin from head to toe regularly.  Tell your health care provider about any new moles or changes in moles, especially if: ? There is a change in a mole's size, shape, or color. ? You have a mole that is larger than a pencil eraser.  Always use sunscreen. Apply sunscreen liberally and repeat throughout the day.  Protect yourself by wearing long sleeves, pants, a wide-brimmed hat, and sunglasses when outside.  What should I know about heart disease, diabetes, and high blood pressure?  If you are 27-28 years of age, have your blood pressure  checked every 3-5 years. If you are 19 years of age or older, have your blood pressure checked every year. You should have your blood pressure measured twice-once when you are at a hospital or clinic, and once when you are not at a hospital or clinic. Record the  average of the two measurements. To check your blood pressure when you are not at a hospital or clinic, you can use: ? An automated blood pressure machine at a pharmacy. ? A home blood pressure monitor.  Talk to your health care provider about your target blood pressure.  If you are between 5-61 years old, ask your health care provider if you should take aspirin to prevent heart disease.  Have regular diabetes screenings by checking your fasting blood sugar level. ? If you are at a normal weight and have a low risk for diabetes, have this test once every three years after the age of 8. ? If you are overweight and have a high risk for diabetes, consider being tested at a younger age or more often.  A one-time screening for abdominal aortic aneurysm (AAA) by ultrasound is recommended for men aged 32-75 years who are current or former smokers. What should I know about preventing infection? Hepatitis B If you have a higher risk for hepatitis B, you should be screened for this virus. Talk with your health care provider to find out if you are at risk for hepatitis B infection. Hepatitis C Blood testing is recommended for:  Everyone born from [birth year] through 1965.  Anyone with known risk factors for hepatitis C.  Sexually Transmitted Diseases (STDs)  You should be screened each year for STDs including gonorrhea and chlamydia if: ? You are sexually active and are younger than 72 years of age. ? You are older than 72 years of age and your health care provider tells you that you are at risk for this type of infection. ? Your sexual activity has changed since you were last screened and you are at an increased risk for chlamydia or gonorrhea. Ask your health care provider if you are at risk.  Talk with your health care provider about whether you are at high risk of being infected with HIV. Your health care provider may recommend a prescription medicine to help prevent HIV infection.  What else can  I do?  Schedule regular health, dental, and eye exams.  Stay current with your vaccines (immunizations).  Do not use any tobacco products, such as cigarettes, chewing tobacco, and e-cigarettes. If you need help quitting, ask your health care provider.  Limit alcohol intake to no more than 2 drinks per day. One drink equals 12 ounces of beer, 5 ounces of wine, or 1 ounces of hard liquor.  Do not use street drugs.  Do not share needles.  Ask your health care provider for help if you need support or information about quitting drugs.  Tell your health care provider if you often feel depressed.  Tell your health care provider if you have ever been abused or do not feel safe at home. This information is not intended to replace advice given to you by your health care provider. Make sure you discuss any questions you have with your health care provider. Document Released: 03/20/2008 Document Revised: 05/21/2016 Document Reviewed: 06/26/2015 Elsevier Interactive Patient Education  [employer].

## 2018-05-19 NOTE — Progress Notes (Addendum)
Subjective:   Jimmy Rice is a 72 y.o. male who presents for Medicare Annual/Subsequent preventive examination.  Reports health as good Live with someone Children are gone  Diet BMI 27.9  Cut back some now Breakfast - rice- brown, broccoli, chicken  Lunch peanut butter and jelly  May have dinner and eats salad   Exercise Used to exercise every day  Plays golf every day   Smoking remote but smoked less than one pack  Health Maintenance Due  Topic Date Due  . TETANUS/TDAP  10/07/2015  . INFLUENZA VACCINE  05/06/2018   Discussed td vs tdap  Cost is 104.00 and 121.00 respectively;  Decided to take the tdap at the [facility]  Also discussed the shingrix. Stated he did not want to pay 300.00 for this. Explained Part D benefit and he can take this at the [facility] as well  Will take his flu vaccine when they become available     Colonoscopy due 06/2022     Objective:    Vitals: BP 110/70   Ht 5\' 11"  (1.803 m)   Wt 200 lb (90.7 kg)   BMI 27.89 kg/m   Body mass index is 27.89 kg/m.  Advanced Directives 05/19/2018 08/12/2016 07/22/2016 07/11/2016  Does Patient Have a Medical Advance Directive? Yes Yes Yes Yes  Type of Advance Directive - Living will Living will -  Does patient want to make changes to medical advance directive? - - No - Patient declined -  Copy of [place] in Chart? - - No - copy requested No - copy requested    Tobacco Social History   Tobacco Use  Smoking Status Former Smoker  . Types: Cigarettes  Smokeless Tobacco Never Used  Tobacco Comment   "quit smoking in the late 1960's; smoked < 1 pack cigarettes TOTAL"     Counseling given: Yes Comment: "quit smoking in the late 1960's; smoked < 1 pack cigarettes TOTAL"   Clinical Intake:    Past Surgical History:  Procedure Laterality Date  . COLONOSCOPY    . INGUINAL HERNIA REPAIR Right 1997  . NASAL SINUS SURGERY  1997  . PARTIAL KNEE ARTHROPLASTY Right 07/22/2016   Procedure:  UNICOMPARTMENTAL KNEE ARTHROPLASTY;  Surgeon: Renette Butters, MD;  Location: [facility];  Service: Orthopedics;  Laterality: Right;  . PROSTATE BIOPSY  X 5  . 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 TRANSURETHRAL RESECTION OF PROSTATE  12/2014   Family History  Problem Relation Age of Onset  . Stomach cancer Paternal Aunt   . Colon cancer Neg Hx   . Esophageal cancer Neg Hx   . Rectal cancer Neg Hx    Social History   Socioeconomic History  . Marital status: Single    Spouse name: Not on file  . Number of children: Not on file  . Years of education: Not on file  . Highest education level: Not on file  Occupational History  . Not on file  Social Needs  . Financial resource strain: Not on file  . Food insecurity:    Worry: Not on file    Inability: Not on file  . Transportation needs:    Medical: Not on file    Non-medical: Not on file  Tobacco Use  . Smoking status: Former Smoker    Types: Cigarettes  . Smokeless tobacco: Never Used  . Tobacco comment: "quit smoking in the late 1960's; smoked < 1 pack cigarettes TOTAL"  Substance and Sexual Activity  .  Alcohol use: Yes    Alcohol/week: 1.0 standard drinks    Types: 1 Cans of beer per week    Comment: none  . Drug use: No  . Sexual activity: Yes  Lifestyle  . Physical activity:    Days per week: Not on file    Minutes per session: Not on file  . Stress: Not on file  Relationships  . Social connections:    Talks on phone: Not on file    Gets together: Not on file    Attends religious service: Not on file    Active member of club or organization: Not on file    Attends meetings of clubs or organizations: Not on file    Relationship status: Not on file  Other Topics Concern  . Not on file  Social History Narrative  . Not on file    Outpatient Encounter Medications as of 05/19/2018  Medication Sig  . amLODipine (NORVASC) 10 MG tablet Take 1 tablet (10 mg total) by mouth daily. (Patient taking  differently: Take 10 mg by mouth. Half tab daily)  . atorvastatin (LIPITOR) 10 MG tablet Take 1 tablet (10 mg total) by mouth daily at 6 PM.  . losartan-hydrochlorothiazide (HYZAAR) 100-12.5 MG tablet Take 1 tablet by mouth daily. 110.25 mg take one tab daily   No facility-administered encounter medications on file as of 05/19/2018.     Activities of Daily Living In your present state of health, do you have any difficulty performing the following activities: 05/19/2018  Hearing? N  Vision? N  Difficulty concentrating or making decisions? N  Walking or climbing stairs? N  Dressing or bathing? N  Doing errands, shopping? N  Preparing Food and eating ? N  Using the Toilet? N  In the past six months, have you accidently leaked urine? N  Comment goes to bathroom at hs  Do you have problems with loss of bowel control? N  Managing your Medications? N  Managing your Finances? N  Housekeeping or managing your Housekeeping? N  Some recent data might be hidden    Patient Care Team: Eulas Post, MD as PCP - General   Assessment:   This is a routine wellness examination for [name].  Exercise Activities and Dietary recommendations Current Exercise Habits: Home exercise routine, Type of exercise: walking;strength training/weights(will [facility] with [facility]), Time (Minutes): 60, Frequency (Times/Week): 3, Weekly Exercise (Minutes/Week): 180  Goals    . Patient Stated     Wants to get back to the gym Early am or after work or weekends - stopped x 6 months  Start back swimming   Moving back to [place]; still has a place in [place]  When settled in home in [place], you will go back to the gym  Continue to play golf everyday!       Fall Risk Fall Risk  05/19/2018 05/19/2018 03/31/2017 01/14/2016 11/02/2015  Falls in the past year? No No No No No     Depression Screen PHQ 2/9 Scores 05/19/2018 05/19/2018 03/31/2017 01/14/2016  PHQ - 2 Score 0 0 0 0    Cognitive  Function MMSE - Mini Mental State Exam 05/19/2018  Not completed: (No Data)   Ad8 score reviewed for issues:  Issues making decisions:  Less interest in hobbies / activities:  Repeats questions, stories (family complaining):  Trouble using ordinary gadgets (microwave, computer, phone):  Forgets the month or year:   Mismanaging finances:   Remembering appts:  Daily problems with thinking  and/or memory: Ad8 score is=0          Immunization History  Administered Date(s) Administered  . Influenza,inj,Quad PF,6+ Mos 06/17/2013  . Influenza-Unspecified 07/07/2015, 07/08/2016, 07/06/2017  . Pneumococcal Conjugate-13 08/14/2014  . Pneumococcal Polysaccharide-23 02/10/2011  . Td 10/06/2005  . Zoster 05/12/2012     Screening Tests Health Maintenance  Topic Date Due  . TETANUS/TDAP  10/07/2015  . INFLUENZA VACCINE  05/06/2018  . COLONOSCOPY  06/24/2022  . Hepatitis C Screening  Completed  . PNA vac Low Risk Adult  Completed         Plan:      PCP Notes  Health Maintenance Discussed td vs tdap  Cost is 104.00 and 121.00 respectively;  Decided to take the tdap at the [facility]  Also discussed the shingrix. Stated he did not want to pay 300.00 for this. Explained Part D benefit and he can take this at the [facility] as well  Will take his flu vaccine when they become available     Colonoscopy due 06/2022   Abnormal Screens  As noted   Referrals  none  Patient concerns; Going back to the gym; moving from [place] to [place]. Will start back when he gets settled  Nurse Concerns; As noted  Next PCP apt Annual and was seen today with blood work      I have personally reviewed and noted the following in the patient's chart:   . Medical and social history . Use of alcohol, tobacco or illicit drugs  . Current medications and supplements . Functional ability and status . Nutritional status . Physical activity . Advanced directives . List of other  physicians . Hospitalizations, surgeries, and ER visits in previous 12 months . Vitals . Screenings to include cognitive, depression, and falls . Referrals and appointments  In addition, I have reviewed and discussed with patient certain preventive protocols, quality metrics, and best practice recommendations. A written personalized care plan for preventive services as well as general preventive health recommendations were provided to patient.     SNKNL,[email or web address], RN  05/19/2018   I have reviewed the documentation for the [facility] and [facility] provided by the health coach and agree with their documentation. I was immediately available for any questions  Eulas Post MD [facility]

## 2018-05-19 NOTE — Progress Notes (Signed)
Subjective:     Patient ID: Jimmy Rice, male   DOB: 22-Sep-1946, 72 y.o.   MRN: [id number]  HPI Patient seen for physical exam today as well as Medicare wellness visit. He has history of prostate cancer, chronic kidney disease, hypertension, osteoarthritis, BPH, hyperlipidemia  He had prostate procedure during the past year which has helped his urine flow considerably.  His complaints today are general fatigue. He has much less stamina than he has the past. Had some frequent leg cramps especially at night. He is concerned his blood sugar may be out of control. Had some increased recent thirst and urine frequency. He has had mildly elevated glucose the past. Last A1c 5.4%.  Cystic lesion on his left wrist volar surface. Minimally tender. No injury.  Past Medical History:  Diagnosis Date  . Arthritis    "right knee" (07/22/2016)  . BENIGN PROSTATIC HYPERTROPHY, HX OF 04/04/2009  . Blood transfusion   . Childhood asthma   . Chronic kidney disease (CKD), stage III (moderate) (HCC)    cysts on kidneys, 3rd stage kidney disease  . Complication of anesthesia    woke up "fighting"  confusion  . GERD 01/18/2009  . HYPERTENSION 01/18/2009  . HYPOGONADISM 01/18/2009   Past Surgical History:  Procedure Laterality Date  . COLONOSCOPY    . INGUINAL HERNIA REPAIR Right 1997  . NASAL SINUS SURGERY  1997  . PARTIAL KNEE ARTHROPLASTY Right 07/22/2016   Procedure: UNICOMPARTMENTAL KNEE ARTHROPLASTY;  Surgeon: Renette Butters, MD;  Location: [facility];  Service: Orthopedics;  Laterality: Right;  . PROSTATE BIOPSY  X 5  . 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 TRANSURETHRAL RESECTION OF PROSTATE  12/2014    reports that he has quit smoking. His smoking use included cigarettes. He has never used smokeless tobacco. He reports that he drinks about 1.0 standard drinks of alcohol per week. He reports that he does not use drugs. family history includes Stomach cancer in his paternal  aunt. Allergies  Allergen Reactions  . Flomax [Tamsulosin Hcl] Other (See Comments)    DIFFICULTY BREATHING NASAL STUFFINESS  . Amlodipine Hives     Review of Systems  Constitutional: Positive for fatigue. Negative for appetite change, chills, fever and unexpected weight change.  HENT: Negative for trouble swallowing.   Respiratory: Negative for cough and shortness of breath.   Cardiovascular: Negative for chest pain and leg swelling.  Gastrointestinal: Negative for abdominal pain, constipation, diarrhea, nausea and vomiting.  Genitourinary: Negative for dysuria.       Objective:   Physical Exam  Constitutional: He appears well-developed and well-nourished.  HENT:  Right Ear: External ear normal.  Left Ear: External ear normal.  Mouth/Throat: Oropharynx is clear and moist.  Neck: Neck supple.  Cardiovascular: Normal rate and regular rhythm.  Pulmonary/Chest: Effort normal and breath sounds normal.  Abdominal: Soft. He exhibits no mass. There is no tenderness. There is no rebound and no guarding.  Musculoskeletal: He exhibits no edema.  Skin: No rash noted.       Assessment:     #1 Physical exam. Patient has multiple chronic problems as above. We addressed several preventative issues as below  #2 history of prostate cancer  #3 hypertension stable and at goal  #4 generalized fatigue  #5 history of frequent leg cramps  #6 past history of hyperglycemia  #7 benign-appearing ganglion cyst left wrist    Plan:     -Recheck PSA -Check other labs including CBC, comp metabolic panel, TSH,  A1c -Patient will discuss tetanus during his Medicare wellness visit  Eulas Post MD [facility]

## 2018-05-19 NOTE — Patient Instructions (Signed)
Ganglion Cyst A ganglion cyst is a noncancerous, fluid-filled lump that occurs near joints or tendons. The ganglion cyst grows out of a joint or the lining of a tendon. It most often develops in the hand or wrist, but it can also develop in the shoulder, elbow, hip, knee, ankle, or foot. The round or oval ganglion cyst can be the size of a pea or larger than a grape. Increased activity may enlarge the size of the cyst because more fluid starts to build up. What are the causes? It is not known what causes a ganglion cyst to grow. However, it may be related to:  Inflammation or irritation around the joint.  An injury.  Repetitive movements or overuse.  Arthritis.  What increases the risk? Risk factors include:  Being a woman.  Being age 20-50.  What are the signs or symptoms? Symptoms may include:  A lump. This most often appears on the hand or wrist, but it can occur in other areas of the body.  Tingling.  Pain.  Numbness.  Muscle weakness.  Weak grip.  Less movement in a joint.  How is this diagnosed? Ganglion cysts are most often diagnosed based on a physical exam. Your health care provider will feel the lump and may shine a light alongside it. If it is a ganglion cyst, a light often shines through it. Your health care provider may order an X-ray, ultrasound, or MRI to rule out other conditions. How is this treated? Ganglion cysts usually go away on their own without treatment. If pain or other symptoms are involved, treatment may be needed. Treatment is also needed if the ganglion cyst limits your movement or if it gets infected. Treatment may include:  Wearing a brace or splint on your wrist or finger.  Taking anti-inflammatory medicine.  Draining fluid from the lump with a needle (aspiration).  Injecting a steroid into the joint.  Surgery to remove the ganglion cyst.  Follow these instructions at home:  Do not press on the ganglion cyst, poke it with a  needle, or hit it.  Take medicines only as directed by your health care provider.  Wear your brace or splint as directed by your health care provider.  Watch your ganglion cyst for any changes.  Keep all follow-up visits as directed by your health care provider. This is important. Contact a health care provider if:  Your ganglion cyst becomes larger or more painful.  You have increased redness, red streaks, or swelling.  You have pus coming from the lump.  You have weakness or numbness in the affected area.  You have a fever or chills. This information is not intended to replace advice given to you by your health care provider. Make sure you discuss any questions you have with your health care provider. Document Released: 09/19/2000 Document Revised: 02/28/2016 Document Reviewed: 03/07/2014 Elsevier Interactive Patient Education  2018 Elsevier Inc.  

## 2018-05-21 ENCOUNTER — Encounter: Payer: Self-pay | Admitting: *Deleted

## 2018-06-10 DIAGNOSIS — N183 Chronic kidney disease, stage 3 (moderate): Secondary | ICD-10-CM | POA: Diagnosis not present

## 2018-06-10 DIAGNOSIS — I129 Hypertensive chronic kidney disease with stage 1 through stage 4 chronic kidney disease, or unspecified chronic kidney disease: Secondary | ICD-10-CM | POA: Diagnosis not present

## 2018-06-17 DIAGNOSIS — R69 Illness, unspecified: Secondary | ICD-10-CM | POA: Diagnosis not present

## 2018-06-28 DIAGNOSIS — H01022 Squamous blepharitis right lower eyelid: Secondary | ICD-10-CM | POA: Diagnosis not present

## 2018-06-28 DIAGNOSIS — H25813 Combined forms of age-related cataract, bilateral: Secondary | ICD-10-CM | POA: Diagnosis not present

## 2018-06-28 DIAGNOSIS — H01025 Squamous blepharitis left lower eyelid: Secondary | ICD-10-CM | POA: Diagnosis not present

## 2018-06-28 DIAGNOSIS — H04123 Dry eye syndrome of bilateral lacrimal glands: Secondary | ICD-10-CM | POA: Diagnosis not present

## 2018-06-28 DIAGNOSIS — H01024 Squamous blepharitis left upper eyelid: Secondary | ICD-10-CM | POA: Diagnosis not present

## 2018-06-28 DIAGNOSIS — H01021 Squamous blepharitis right upper eyelid: Secondary | ICD-10-CM | POA: Diagnosis not present

## 2018-07-13 ENCOUNTER — Encounter: Payer: Self-pay | Admitting: Family Medicine

## 2018-07-13 ENCOUNTER — Telehealth: Payer: Self-pay | Admitting: Family Medicine

## 2018-07-13 ENCOUNTER — Other Ambulatory Visit: Payer: Self-pay

## 2018-07-13 DIAGNOSIS — N183 Chronic kidney disease, stage 3 unspecified: Secondary | ICD-10-CM

## 2018-07-13 NOTE — Telephone Encounter (Signed)
Orders for lab are in and patient has a lab appt tomorrow at 3pm. Patient is aware.

## 2018-07-13 NOTE — Telephone Encounter (Signed)
Last OV 05/19/18, No future OV  Please advise.

## 2018-07-13 NOTE — Telephone Encounter (Signed)
Yes   Dx=CKD

## 2018-07-13 NOTE — Telephone Encounter (Signed)
Pt dropped by wanted to see if he could get a BMP and a GFR lab work done.  May I have an order?

## 2018-07-14 ENCOUNTER — Other Ambulatory Visit (INDEPENDENT_AMBULATORY_CARE_PROVIDER_SITE_OTHER): Payer: Medicare HMO

## 2018-07-14 DIAGNOSIS — N183 Chronic kidney disease, stage 3 unspecified: Secondary | ICD-10-CM

## 2018-07-15 LAB — BASIC METABOLIC PANEL WITH GFR
BUN/Creatinine Ratio: 15 (calc) (ref 6–22)
BUN: 33 mg/dL — ABNORMAL HIGH (ref 7–25)
CO2: 29 mmol/L (ref 20–32)
CREATININE: 2.22 mg/dL — AB (ref 0.70–1.18)
Calcium: 9.4 mg/dL (ref 8.6–10.3)
Chloride: 98 mmol/L (ref 98–110)
GFR, EST AFRICAN AMERICAN: 33 mL/min/{1.73_m2} — AB (ref >=60)
GFR, EST NON AFRICAN AMERICAN: 29 mL/min/{1.73_m2} — AB (ref >=60)
Glucose, Bld: 151 mg/dL — ABNORMAL HIGH (ref 65–99)
POTASSIUM: 3.7 mmol/L (ref 3.5–5.3)
SODIUM: 136 mmol/L (ref 135–146)

## 2018-07-16 DIAGNOSIS — R69 Illness, unspecified: Secondary | ICD-10-CM | POA: Diagnosis not present

## 2018-07-21 ENCOUNTER — Telehealth: Payer: Self-pay | Admitting: *Deleted

## 2018-07-21 ENCOUNTER — Other Ambulatory Visit: Payer: Self-pay

## 2018-07-21 DIAGNOSIS — R7989 Other specified abnormal findings of blood chemistry: Secondary | ICD-10-CM

## 2018-07-21 NOTE — Telephone Encounter (Signed)
Copied from [place] [id number]. Topic: General - Other >> Jul 21, 2018 11:31 AM Janace Aris A wrote: Reason for CRM: Pt called in to go over his lab results. He says Jinny Blossom reached out to him a couple days ago but he missed her call.

## 2018-07-21 NOTE — Telephone Encounter (Signed)
Called patient and gave him lab results. Patient verbalized an understanding and is coming back on 07/30/2018 at 8:45am for labs.

## 2018-07-27 DIAGNOSIS — R69 Illness, unspecified: Secondary | ICD-10-CM | POA: Diagnosis not present

## 2018-08-02 ENCOUNTER — Other Ambulatory Visit (INDEPENDENT_AMBULATORY_CARE_PROVIDER_SITE_OTHER): Payer: Medicare HMO

## 2018-08-02 ENCOUNTER — Encounter: Payer: Self-pay | Admitting: Family Medicine

## 2018-08-02 DIAGNOSIS — R7989 Other specified abnormal findings of blood chemistry: Secondary | ICD-10-CM | POA: Diagnosis not present

## 2018-08-02 LAB — BASIC METABOLIC PANEL
BUN: 26 mg/dL — AB (ref 6–23)
CALCIUM: 9.5 mg/dL (ref 8.4–10.5)
CO2: 30 meq/L (ref 19–32)
CREATININE: 1.81 mg/dL — AB (ref 0.40–1.50)
Chloride: 99 mEq/L (ref 96–112)
GFR: 47.53 mL/min — ABNORMAL LOW (ref >60.00)
GLUCOSE: 114 mg/dL — AB (ref 70–99)
Potassium: 3.5 mEq/L (ref 3.5–5.1)
Sodium: 138 mEq/L (ref 135–145)

## 2018-08-09 ENCOUNTER — Telehealth: Payer: Self-pay | Admitting: Family Medicine

## 2018-08-09 NOTE — Telephone Encounter (Signed)
Copied from [place] [id number]. Topic: Quick Communication - See Telephone Encounter >> Aug 09, 2018 11:50 AM Gardiner Ramus wrote: CRM for notification. See Telephone encounter for: 08/09/18. Pt called and would like a call back from a nurse regarding medications . Pt states that he no longer takes losartan-hydrochlorothiazide a(HYZAAR) 100-12.5 MG tablet [[id number]] together. Pt would like to know if he can take medication in evening. Please advise

## 2018-08-09 NOTE — Telephone Encounter (Signed)
Only drawback with HCTZ in evening is that he might have slight increase in urine frequency.  Otherwise- no real difference or advantage.

## 2018-08-09 NOTE — Telephone Encounter (Signed)
Called patient and left a detailed voice message on voice mail with the message from Dr. Elease Hashimoto.

## 2018-08-09 NOTE — Telephone Encounter (Signed)
Called patient and he stated that his nephrologist has him on 50 mg Losartan daily and 12.5 HCTZ daily and wanted to know if this is better to take in the morning or the evening?  Please advise.

## 2018-09-01 DIAGNOSIS — C61 Malignant neoplasm of prostate: Secondary | ICD-10-CM | POA: Diagnosis not present

## 2018-09-01 DIAGNOSIS — Z0000 Encounter for general adult medical examination without abnormal findings: Secondary | ICD-10-CM | POA: Diagnosis not present

## 2018-10-04 DIAGNOSIS — N281 Cyst of kidney, acquired: Secondary | ICD-10-CM | POA: Diagnosis not present

## 2018-10-04 DIAGNOSIS — N2889 Other specified disorders of kidney and ureter: Secondary | ICD-10-CM | POA: Diagnosis not present

## 2018-10-04 DIAGNOSIS — I129 Hypertensive chronic kidney disease with stage 1 through stage 4 chronic kidney disease, or unspecified chronic kidney disease: Secondary | ICD-10-CM | POA: Diagnosis not present

## 2018-10-12 ENCOUNTER — Encounter: Payer: Self-pay | Admitting: Family Medicine

## 2018-10-12 ENCOUNTER — Ambulatory Visit (INDEPENDENT_AMBULATORY_CARE_PROVIDER_SITE_OTHER): Payer: Medicare HMO | Admitting: Family Medicine

## 2018-10-12 ENCOUNTER — Other Ambulatory Visit: Payer: Self-pay

## 2018-10-12 VITALS — BP 130/80 | HR 75 | Temp 97.4°F | Ht 71.0 in | Wt 203.2 lb

## 2018-10-12 DIAGNOSIS — N183 Chronic kidney disease, stage 3 unspecified: Secondary | ICD-10-CM

## 2018-10-12 DIAGNOSIS — I10 Essential (primary) hypertension: Secondary | ICD-10-CM | POA: Diagnosis not present

## 2018-10-12 DIAGNOSIS — N281 Cyst of kidney, acquired: Secondary | ICD-10-CM

## 2018-10-12 DIAGNOSIS — L0292 Furuncle, unspecified: Secondary | ICD-10-CM | POA: Diagnosis not present

## 2018-10-12 LAB — URINALYSIS
BILIRUBIN URINE: NEGATIVE
KETONES UR: NEGATIVE
LEUKOCYTES UA: NEGATIVE
NITRITE: NEGATIVE
Specific Gravity, Urine: 1.015 (ref 1.000–1.030)
Urine Glucose: NEGATIVE
Urobilinogen, UA: 1 (ref 0.0–1.0)
pH: 6 (ref 5.0–8.0)

## 2018-10-12 LAB — CBC WITH DIFFERENTIAL/PLATELET
Basophils Absolute: 0 10*3/uL (ref 0.0–0.1)
Basophils Relative: 1 % (ref 0.0–3.0)
EOS ABS: 0.2 10*3/uL (ref 0.0–0.7)
Eosinophils Relative: 5.4 % — ABNORMAL HIGH (ref 0.0–5.0)
HCT: 40.5 % (ref 39.0–52.0)
Hemoglobin: 13.3 g/dL (ref 13.0–17.0)
LYMPHS ABS: 1.6 10*3/uL (ref 0.7–4.0)
Lymphocytes Relative: 41.3 % (ref 12.0–46.0)
MCHC: 32.8 g/dL (ref 30.0–36.0)
MCV: 90.4 fl (ref 78.0–100.0)
MONO ABS: 0.4 10*3/uL (ref 0.1–1.0)
Monocytes Relative: 10.9 % (ref 3.0–12.0)
NEUTROS ABS: 1.6 10*3/uL (ref 1.4–7.7)
NEUTROS PCT: 41.4 % — AB (ref 43.0–77.0)
PLATELETS: 222 10*3/uL (ref 150.0–400.0)
RBC: 4.48 Mil/uL (ref 4.22–5.81)
RDW: 12.9 % (ref 11.5–15.5)
WBC: 3.8 10*3/uL — ABNORMAL LOW (ref 4.0–10.5)

## 2018-10-12 LAB — COMPREHENSIVE METABOLIC PANEL
ALT: 14 U/L (ref 0–53)
AST: 15 U/L (ref 0–37)
Albumin: 4.2 g/dL (ref 3.5–5.2)
Alkaline Phosphatase: 57 U/L (ref 39–117)
BILIRUBIN TOTAL: 0.6 mg/dL (ref 0.2–1.2)
BUN: 32 mg/dL — ABNORMAL HIGH (ref 6–23)
CHLORIDE: 100 meq/L (ref 96–112)
CO2: 29 meq/L (ref 19–32)
CREATININE: 1.8 mg/dL — AB (ref 0.40–1.50)
Calcium: 9.6 mg/dL (ref 8.4–10.5)
GFR: 47.8 mL/min — ABNORMAL LOW (ref >60.00)
GLUCOSE: 107 mg/dL — AB (ref 70–99)
Potassium: 3.7 mEq/L (ref 3.5–5.1)
Sodium: 138 mEq/L (ref 135–145)
Total Protein: 7.2 g/dL (ref 6.0–8.3)

## 2018-10-12 NOTE — Progress Notes (Signed)
Subjective:     Patient ID: Jimmy Rice, male   DOB: 07/18/1946, 73 y.o.   MRN: [id number]  HPI Patient has chronic problems including hypertension, history of prostate cancer, BPH, chronic kidney disease, history of bilateral renal cysts.  He is currently followed by nephrologist up in [place].  He is requesting lab work today and preparation for his visit there later this month.  Specifically requesting urinalysis, CBC, comprehensive chemistries.  He states he had recent ultrasound which showed some right hydronephrosis and bilateral renal cysts.  He had CT renal 11/18 which showed no hydronephrosis but multiple bilateral kidney cyst.  He has no recent edema.  He has hypertension which is treated with amlodipine and losartan HCTZ.  Blood pressures been controlled.  He has hyperlipidemia treated with Lipitor.  Small pimple-like lesion right lower back which he noticed just couple days ago.  Nonpainful.  No fevers or chills.  Past Medical History:  Diagnosis Date  . Arthritis    "right knee" (07/22/2016)  . BENIGN PROSTATIC HYPERTROPHY, HX OF 04/04/2009  . Blood transfusion   . Childhood asthma   . Chronic kidney disease (CKD), stage III (moderate) (HCC)    cysts on kidneys, 3rd stage kidney disease  . Complication of anesthesia    woke up "fighting"  confusion  . GERD 01/18/2009  . HYPERTENSION 01/18/2009  . HYPOGONADISM 01/18/2009   Past Surgical History:  Procedure Laterality Date  . COLONOSCOPY    . INGUINAL HERNIA REPAIR Right 1997  . NASAL SINUS SURGERY  1997  . PARTIAL KNEE ARTHROPLASTY Right 07/22/2016   Procedure: UNICOMPARTMENTAL KNEE ARTHROPLASTY;  Surgeon: Renette Butters, MD;  Location: [facility];  Service: Orthopedics;  Laterality: Right;  . PROSTATE BIOPSY  X 5  . 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 TRANSURETHRAL RESECTION OF PROSTATE  12/2014    reports that he has quit smoking. His smoking use included cigarettes. He has never used smokeless  tobacco. He reports current alcohol use of about 1.0 standard drinks of alcohol per week. He reports that he does not use drugs. family history includes Stomach cancer in his paternal aunt. Allergies  Allergen Reactions  . Flomax [Tamsulosin Hcl] Other (See Comments)    DIFFICULTY BREATHING NASAL STUFFINESS  . Amlodipine Hives     Review of Systems  Constitutional: Negative for fatigue.  Eyes: Negative for visual disturbance.  Respiratory: Negative for cough, chest tightness and shortness of breath.   Cardiovascular: Negative for chest pain, palpitations and leg swelling.  Genitourinary: Negative for dysuria.  Neurological: Negative for dizziness, syncope, weakness, light-headedness and headaches.       Objective:   Physical Exam Constitutional:      Appearance: He is well-developed.  HENT:     Right Ear: External ear normal.     Left Ear: External ear normal.  Eyes:     Pupils: Pupils are equal, round, and reactive to light.  Neck:     Musculoskeletal: Neck supple.     Thyroid: No thyromegaly.  Cardiovascular:     Rate and Rhythm: Normal rate and regular rhythm.  Pulmonary:     Effort: Pulmonary effort is normal. No respiratory distress.     Breath sounds: Normal breath sounds. No wheezing or rales.  Skin:    Comments: She has small approximately 4 mm slightly indurated erythematous papule right lower back region.  No surrounding cellulitis.  Nontender.  No fluctuance.  No pustule  Neurological:     Mental  Status: He is alert and oriented to person, place, and time.        Assessment:     #1 hypertension stable  #2 chronic kidney disease.  There is history of polycystic kidneys by family history and by his previous renal imaging.  Currently followed by nephrology  #3 history of prostate cancer  #4 small papule right lower back.  Suspect small furuncle.  Nonfluctuant    Plan:     -Recheck labs with urinalysis, CBC, comprehensive metabolic panel -Warm  compresses to right low back skin lesion and follow-up for any cellulitis changes or any progressive redness or swelling or pain -We will provide printed copy of his lab results to take with him for his upcoming nephrology visit -Continue current blood pressure medications -Reminder to avoid all non-steroidals  Eulas Post MD  Primary Care at [facility]'

## 2018-10-12 NOTE — Patient Instructions (Signed)
Chronic Kidney Disease, Adult  Chronic kidney disease (CKD) occurs when the kidneys become damaged slowly over a long period of time. The kidneys are a pair of organs that do many important jobs in the body, including:  · Removing waste and extra fluid from the blood to make urine.  · Making hormones that maintain the amount of fluid in tissues and blood vessels.  · Maintaining the right amount of fluids and chemicals in the body.  A small amount of kidney damage may not cause problems, but a large amount of damage may make it hard or impossible for the kidneys to work the way they should. If steps are not taken to slow down kidney damage or to stop it from getting worse, the kidneys may stop working permanently (end-stage renal disease or ESRD). Most of the time, CKD does not go away, but it can often be controlled. People who have CKD are usually able to live normal lives.  What are the causes?  The most common causes of this condition are diabetes and high blood pressure (hypertension). Other causes include:  · Heart and blood vessel (cardiovascular) disease.  · Kidney diseases, such as:  ? Glomerulonephritis.  ? Interstitial nephritis.  ? Polycystic kidney disease.  ? Renal vascular disease.  · Diseases that affect the immune system.  · Genetic diseases.  · Medicines that damage the kidneys, such as anti-inflammatory medicines.  · Being around or being in contact with poisonous (toxic) substances.  · A kidney or urinary infection that occurs again and again (recurs).  · Vasculitis. This is swelling or inflammation of the blood vessels.  · A problem with urine flow that may be caused by:  ? Cancer.  ? Having kidney stones more than one time.  ? An enlarged prostate, in males.  What increases the risk?  You are more likely to develop this condition if you:  · Are older than age 60.  · Are male.  · Are African-American, Hispanic, Asian, Pacific Islander, or American Indian.  · Are a current or former  smoker.  · Are obese.  · Have a family history of kidney disease or failure.  · Often take medicines that are damaging to the kidneys.  What are the signs or symptoms?  Symptoms of this condition include:  · Swelling (edema) of the face, legs, ankles, or feet.  · Tiredness (lethargy) and having less energy.  · Nausea or vomiting.  · Confusion or trouble concentrating.  · Problems with urination, such as:  ? Painful or burning feeling during urination.  ? Decreased urine production.  ? Frequent urination, especially at night.  ? Bloody urine.  · Muscle twitches and cramps, especially in the legs.  · Shortness of breath.  · Weakness.  · Loss of appetite.  · Metallic taste in the mouth.  · Trouble sleeping.  · Dry, itchy skin.  · A low blood count (anemia).  · Pale lining of the eyelids and surface of the eye (conjunctiva).  Symptoms develop slowly and may not be obvious until the kidney damage becomes severe. It is possible to have kidney disease for years without having any symptoms.  How is this diagnosed?  This condition may be diagnosed based on:  · Blood tests.  · Urine tests.  · Imaging tests, such as an ultrasound or CT scan.  · A test in which a sample of tissue is removed from the kidneys to be examined under a microscope (kidney biopsy).    These test results will help your health care provider determine how serious the CKD is.  How is this treated?  There is no cure for most cases of this condition, but treatment usually relieves symptoms and prevents or slows the progression of the disease. Treatment may include:  · Making diet changes, which may require you to avoid alcohol, salty foods (sodium), and foods that are high in potassium, calcium, and protein.  · Medicines:  ? To lower blood pressure.  ? To control blood glucose.  ? To relieve anemia.  ? To relieve swelling.  ? To protect your bones.  ? To improve the balance of electrolytes in your blood.  · Removing toxic waste from the body through types of  dialysis, if the kidneys can no longer do their job (kidney failure).  · Managing any other conditions that are causing your CKD or making it worse.  Follow these instructions at home:  Medicines  · Take over-the-counter and prescription medicines only as told by your health care provider. The dose of some medicines that you take may need to be adjusted.  · Do not take any new medicines unless approved by your health care provider. Many medicines can worsen your kidney damage.  · Do not take any vitamin and mineral supplements unless approved by your health care provider. Many nutritional supplements can worsen your kidney damage.  General instructions  · Follow your prescribed diet as told by your health care provider.  · Do not use any products that contain nicotine or tobacco, such as cigarettes and e-cigarettes. If you need help quitting, ask your health care provider.  · Monitor and track your blood pressure at home. Report changes in your blood pressure as told by your health care provider.  · If you are being treated for diabetes, monitor and track your blood sugar (blood glucose) levels as told by your health care provider.  · Maintain a healthy weight. If you need help with this, ask your health care provider.  · Start or continue an exercise plan. Exercise at least 30 minutes a day, 5 days a week.  · Keep your immunizations up to date as told by your health care provider.  · Keep all follow-up visits as told by your health care provider. This is important.  Where to find more information  · American Association of Kidney Patients: www.aakp.org  · National Kidney Foundation: www.kidney.org  · American Kidney Fund: www.akfinc.org  · [employer] [facility]: www.lifeoptions.org and www.kidneyschool.org  Contact a health care provider if:  · Your symptoms get worse.  · You develop new symptoms.  Get help right away if:  · You develop symptoms of ESRD, which include:  ? Headaches.  ? Numbness in the  hands or feet.  ? Easy bruising.  ? Frequent hiccups.  ? Chest pain.  ? Shortness of breath.  ? Lack of menstruation, in women.  · You have a fever.  · You have decreased urine production.  · You have pain or bleeding when you urinate.  Summary  · Chronic kidney disease (CKD) occurs when the kidneys become damaged slowly over a long period of time.  · The most common causes of this condition are diabetes and high blood pressure (hypertension).  · There is no cure for most cases of this condition, but treatment usually relieves symptoms and prevents or slows the progression of the disease. Treatment may include a combination of medicines and lifestyle changes.  This information is   not intended to replace advice given to you by your health care provider. Make sure you discuss any questions you have with your health care provider.  Document Released: 07/01/2008 Document Revised: 10/30/2016 Document Reviewed: 10/30/2016  Elsevier Interactive Patient Education © 2019 Elsevier Inc.

## 2018-11-05 DIAGNOSIS — N184 Chronic kidney disease, stage 4 (severe): Secondary | ICD-10-CM | POA: Diagnosis not present

## 2018-11-05 DIAGNOSIS — I129 Hypertensive chronic kidney disease with stage 1 through stage 4 chronic kidney disease, or unspecified chronic kidney disease: Secondary | ICD-10-CM | POA: Diagnosis not present

## 2018-11-05 DIAGNOSIS — N183 Chronic kidney disease, stage 3 (moderate): Secondary | ICD-10-CM | POA: Diagnosis not present

## 2018-11-05 DIAGNOSIS — C61 Malignant neoplasm of prostate: Secondary | ICD-10-CM | POA: Diagnosis not present

## 2018-11-05 DIAGNOSIS — Q612 Polycystic kidney, adult type: Secondary | ICD-10-CM | POA: Diagnosis not present

## 2019-01-05 ENCOUNTER — Other Ambulatory Visit: Payer: Self-pay | Admitting: Family Medicine

## 2019-01-05 MED ORDER — ATORVASTATIN CALCIUM 10 MG PO TABS: 10.0000 mg | ORAL_TABLET | Freq: Every day | ORAL | 1 refills | Status: DC

## 2019-01-05 MED ORDER — LOSARTAN POTASSIUM-HCTZ 100-12.5 MG PO TABS: 1.0000 | ORAL_TABLET | Freq: Every day | ORAL | 2 refills | Status: DC

## 2019-01-18 ENCOUNTER — Ambulatory Visit: Payer: Self-pay | Admitting: *Deleted

## 2019-01-18 NOTE — Telephone Encounter (Signed)
Please see message. °

## 2019-01-18 NOTE — Telephone Encounter (Signed)
Pt reports generalized weakness, onset "3 hours ago." States "Just came over me." States BP this AM 132/84. No new meds. Denies any other symptoms. States "Weak all over, just tired." States is staying hydrated and eating well. States "I think I need a nap." Before TN could further assess, Pt states "I'm going to take a nap and call you back if needed." Pt advised to CB if symptoms persist.  Reason for Disposition . Mild weakness or fatigue with acute minor illness (e.g., colds)  Answer Assessment - Initial Assessment Questions 1. DESCRIPTION: "Describe how you are feeling."     Weak all over 2. SEVERITY: "How bad is it?"  "Can you stand and walk?"   - MILD - Feels weak or tired, but does not interfere with work, school or normal activities   - [place] to stand and walk; weakness interferes with work, school, or normal activities   - SEVERE - Unable to stand or walk     moderate 3. ONSET:  "When did the weakness begin?"     3 hours ago 4. CAUSE: "What do you think is causing the weakness?"     *not sure 5. MEDICINES: "Have you recently started a new medicine or had a change in the amount of a medicine?"     no 6. OTHER SYMPTOMS: "Do you have any other symptoms?" (e.g., chest pain, fever, cough, SOB, vomiting, diarrhea, bleeding, other areas of pain)     *no  Protocols used: WEAKNESS (GENERALIZED) AND FATIGUE-A-AH

## 2019-01-18 NOTE — Telephone Encounter (Signed)
Would call back tomorrow.  Offer Doxy follow up if symptoms persist.

## 2019-01-18 NOTE — Telephone Encounter (Signed)
Please advise 

## 2019-01-19 ENCOUNTER — Other Ambulatory Visit: Payer: Self-pay

## 2019-01-19 ENCOUNTER — Ambulatory Visit (INDEPENDENT_AMBULATORY_CARE_PROVIDER_SITE_OTHER): Payer: Medicare HMO | Admitting: Family Medicine

## 2019-01-19 DIAGNOSIS — R5383 Other fatigue: Secondary | ICD-10-CM

## 2019-01-19 DIAGNOSIS — R0600 Dyspnea, unspecified: Secondary | ICD-10-CM

## 2019-01-19 DIAGNOSIS — R252 Cramp and spasm: Secondary | ICD-10-CM | POA: Diagnosis not present

## 2019-01-19 NOTE — Progress Notes (Signed)
Patient ID: Jimmy Rice, male   DOB: [birth year], 73 y.o.   MRN: [id number]  Virtual Visit via Video Note  I connected with Jimmy Rice on 01/19/19 at  9:00 AM EDT by a video enabled telemedicine application and verified that I am speaking with the correct person using two identifiers.  Location patient: home Location provider:work or home office Persons participating in the virtual visit: patient, provider  I discussed the limitations of evaluation and management by telemedicine and the availability of in person appointments. The patient expressed understanding and agreed to proceed.   HPI:  Patient has chronic problems including hypertension, history of allergic rhinitis, GERD, osteoarthritis, chronic kidney disease stage III, BPH, history of prostate cancer, history low testosterone.  He calls with 1 day history of increased fatigue.  He also states he has had some increased shortness of breath at night but not during the day when lying supine.  He denies any fever.  No cough.  No urinary symptoms.  No chest pain.  No abdominal pain.  Appetite stable.  He thinks he may have had some mild weight loss-but not clearly documented  Has had some muscle cramps.  He takes losartan HCTZ and amlodipine for hypertension.  Patient took a 1 hour nap yesterday and felt somewhat improved.  He does have some frequent allergy symptoms with nasal congestion and some sneezing.  He has not taken anything for his allergies.  Patient was up in [place] until second week of March but again has not had any cough or fever.  He has been fairly isolated over the past month.  He has some achiness in his low back region but not upper back, shoulders, or neck.  No increased peripheral edema.  No history of heart failure.  ROS: See pertinent positives and negatives per HPI.  Past Medical History:  Diagnosis Date  . Arthritis    "right knee" (07/22/2016)  . BENIGN PROSTATIC HYPERTROPHY, HX OF 04/04/2009  . Blood  transfusion   . Childhood asthma   . Chronic kidney disease (CKD), stage III (moderate) (HCC)    cysts on kidneys, 3rd stage kidney disease  . Complication of anesthesia    woke up "fighting"  confusion  . GERD 01/18/2009  . HYPERTENSION 01/18/2009  . HYPOGONADISM 01/18/2009    Past Surgical History:  Procedure Laterality Date  . COLONOSCOPY    . INGUINAL HERNIA REPAIR Right 1997  . NASAL SINUS SURGERY  1997  . PARTIAL KNEE ARTHROPLASTY Right 07/22/2016   Procedure: UNICOMPARTMENTAL KNEE ARTHROPLASTY;  Surgeon: Renette Butters, MD;  Location: [facility];  Service: Orthopedics;  Laterality: Right;  . PROSTATE BIOPSY  X 5  . 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 TRANSURETHRAL RESECTION OF PROSTATE  12/2014    Family History  Problem Relation Age of Onset  . Stomach cancer Paternal Aunt   . Colon cancer Neg Hx   . Esophageal cancer Neg Hx   . Rectal cancer Neg Hx     SOCIAL HX: Smoking 1960s.  No regular alcohol use.   Current Outpatient Medications:  .  amLODipine (NORVASC) 10 MG tablet, Take 1 tablet (10 mg total) by mouth daily. (Patient taking differently: Take 10 mg by mouth. Half tab daily), Disp: 90 tablet, Rfl: 3 .  atorvastatin (LIPITOR) 10 MG tablet, Take 1 tablet (10 mg total) by mouth daily at 6 PM., Disp: 90 tablet, Rfl: 1 .  losartan-hydrochlorothiazide (HYZAAR) 100-12.5 MG tablet, Take 1 tablet by mouth daily.  110.25 mg take one tab daily, Disp: 90 tablet, Rfl: 2  EXAM:  VITALS per patient if applicable:  GENERAL: alert, oriented, appears well and in no acute distress  HEENT: atraumatic, conjunttiva clear, no obvious abnormalities on inspection of external nose and ears  NECK: normal movements of the head and neck  LUNGS: on inspection no signs of respiratory distress, breathing rate appears normal, no obvious gross SOB, gasping or wheezing  CV: no obvious cyanosis  MS: moves all visible extremities without noticeable  abnormality  PSYCH/NEURO: pleasant and cooperative, no obvious depression or anxiety, speech and thought processing grossly intact  ASSESSMENT AND PLAN:  Discussed the following assessment and plan:  Fatigue, unspecified type  Muscle cramps  Dyspnea, unspecified type   Etiology unclear.  We recommend observation for now.  Get plenty of rest.  Consider over-the-counter Claritin and or Flonase for allergy symptoms.  If symptoms not improving over the next couple days consider labs with CBC, comprehensive metabolic panel, and sed rate.  He does not describe any symptoms concerning for active coronavirus infection.  Follow-up immediately or call 911 for any progressive dyspnea.  May need to be seen in person to evaluate if he has any progressive dyspnea     I discussed the assessment and treatment plan with the patient. The patient was provided an opportunity to ask questions and all were answered. The patient agreed with the plan and demonstrated an understanding of the instructions.   The patient was advised to call back or seek an in-person evaluation if the symptoms worsen or if the condition fails to improve as anticipated.   Carolann Littler, MD

## 2019-01-19 NOTE — Telephone Encounter (Signed)
Called patient and he is not feeling well at all. He is very very tired and not feeling any better and stated that he is having body aches and he is having shortness of breath at night but that seems to get a little better in the morning. He stated that he just does not feel well.  Patient has an appointment at 9am this morning.

## 2019-02-07 ENCOUNTER — Telehealth: Payer: Self-pay | Admitting: Family Medicine

## 2019-02-07 NOTE — Telephone Encounter (Signed)
We have Losartan HCTZ on list.  We need to clarify if someone else changed his BP medication.  Would not send on Losartan until we clarify.

## 2019-02-07 NOTE — Telephone Encounter (Signed)
Copied from [place] [id number]. Topic: Quick Communication - See Telephone Encounter >> Feb 07, 2019  3:04 PM Antonieta Iba C wrote: CRM for notification. See Telephone encounter for: 02/07/19.  Pt is requesting to have a Rx for Losartan Potassium - 50 MG 1 tablet a day sent to the [place]  Fax: [phone]

## 2019-02-07 NOTE — Telephone Encounter (Signed)
I do not see this medication list on medication list.  Please advise.

## 2019-02-08 NOTE — Telephone Encounter (Signed)
yes

## 2019-02-08 NOTE — Telephone Encounter (Signed)
Called patient and he stated that Dr. Awilda Metro the Nephrologist in [place] changed him to the Losartan 50 mg due to him being polycystic and they were not able to send this Rx to the [place] since they are out of state and the [place] would not accept.   Do you need notes from [place] sent to [place] so we can send this Rx to the [place] for patient?  Please advise.

## 2019-02-08 NOTE — Telephone Encounter (Signed)
Called patient and advised that he needs to have his doctor in [place] fax [place] the notes to change his medication to the Losartan 50 mg so we can send to the [place].  Patient verbalized an understanding and stated that he will do that as soon as he can.  Patient would like a call once the new prescription is faxed to the [place].  Patient is no longer taking the Hyzaar 100-12.5

## 2019-02-09 NOTE — Telephone Encounter (Signed)
Paperwork from [place] placed in red folder. Please advise when OK to send Losartan 50 mg to [facility] for patient. Thank you!

## 2019-02-09 NOTE — Telephone Encounter (Signed)
Take Hyzaar off med list.  Send Losartan 50 mg one po qd #90 with 3 refills.

## 2019-02-10 ENCOUNTER — Other Ambulatory Visit: Payer: Self-pay

## 2019-02-10 MED ORDER — LOSARTAN POTASSIUM 50 MG PO TABS: 50.0000 mg | ORAL_TABLET | Freq: Every day | ORAL | 3 refills | Status: DC

## 2019-02-10 NOTE — Telephone Encounter (Signed)
Hyzaar has been discontinued and Losartan 50 mg has been added and faxed to [facility] for patient.  Called patient and let him know that I have faxed over his Rx to the [facility] for him.  Patient verbalized an understanding.

## 2019-02-10 NOTE — Telephone Encounter (Signed)
Called patient and LMOVM to return call  [place] for [facility] to Discuss results / PCP / recommendations / Schedule patient  I left a message to let the patient know that I received a fax back from the [facility] and it stated: "[facility] from this [facility]". I am not sure what he needs for me to do so hopefully he can call me back and I can send to the correct destination.  CRM Created.

## 2019-02-15 ENCOUNTER — Other Ambulatory Visit: Payer: Self-pay

## 2019-02-15 ENCOUNTER — Telehealth: Payer: Self-pay | Admitting: Family Medicine

## 2019-02-15 ENCOUNTER — Telehealth: Payer: Self-pay

## 2019-02-15 MED ORDER — LOSARTAN POTASSIUM 50 MG PO TABS: 50.0000 mg | ORAL_TABLET | Freq: Every day | ORAL | 3 refills | Status: DC

## 2019-02-15 NOTE — Telephone Encounter (Signed)
Patient is calling in stating his medication cannot be filled till August.

## 2019-02-15 NOTE — Telephone Encounter (Signed)
Patient's [facility]artan was [facility] to [facility], however it was supposed to be sent to the [place].   Fax # [phone]

## 2019-02-15 NOTE — Telephone Encounter (Signed)
Sent Rx in electronically to [facility]. Called patient and he is aware.

## 2019-02-15 NOTE — Telephone Encounter (Signed)
I called patient to let him know that his [facility], it was printed and had this pharmacy listed. I had left a voice message on last telephone message and let patient know that I had left him a voice message to let him know that the [place] faxed back a written message letting us know that the patient does not go to that pharmacy. I advised to patient that I will print out the Rx and he can come to our office and pick up rhe Rx and take to the [facility] himself and give to them. Patient verbalized an understanding.

## 2019-02-22 ENCOUNTER — Ambulatory Visit: Payer: Self-pay | Admitting: Family Medicine

## 2019-02-22 NOTE — Telephone Encounter (Signed)
Pt called in c/o something's just not right with me.   This is very unusual for me.   He hit his head a month ago on the fireplace mantel with LOC but not sure how long.   Then yesterday he hit his head on the trunk lid of the car bending into the trunk.   No LOC.  This morning he is stumbling around into things and "just not feeling right".  His speech is clear and his conversation is appropriate for the situation.   He knows where he is and coherent.   He does feel very sleepy.  See triage notes.  I have referred him to the ED.   He has someone with him now that is going to take him to the [facility].   That's closest to him.  I sent my triage notes to Dr. Erick Blinks office so he would be aware.   Reason for Disposition . Sounds like a serious injury to the triager  Answer Assessment - Initial Assessment Questions 1. MECHANISM: "How did the injury happen?" For falls, ask: "What height did you fall from?" and "What surface did you fall against?"      *No Answer*I've hit my head twice in the last 2 days.   I hit it on the fireplace mantel on the the right side of my forehead.   I hit so hard I ended up on my couch. Yesterday I hit my head bending over into the trunk of my car.  I was at my friend's house and got up and felt unsteady.   I had to hold onto her for support. This morning when I went to the bathroom I was stumbling into things.  Lately I've had a ache over my left eye.   Every morning I have that pain over my left eye.  It's been happening for a while but then it goes away. 2. ONSET: "When did the injury happen?" (Minutes or hours ago)      See above 3. NEUROLOGIC SYMPTOMS: "Was there any loss of consciousness?" "Are there any other neurological symptoms?"      No strokes or heart problem.   I have problems with my kidneys and prostate CA.  No treatment for the CA for now.  When I hit my head on the mantel it was a month ago.   I did loss consciousness.  I don't know how  long but when I woke up I was on my couch.   I was alone. 4. MENTAL STATUS: "Does the person know who he is, who you are, and where he is?"      I'm just concerned because this is not like me at all.   I'm very athletic.   5. LOCATION: "What part of the head was hit?"      When hit on the trunk it was on the forehead to the left side   Neither time I hit my head I did not bleed. 6. SCALP APPEARANCE: "What does the scalp look like? Is it bleeding now?" If so, ask: "Is it difficult to stop?"      No breaks in skin. 7. SIZE: For cuts, bruises, or swelling, ask: "How large is it?" (e.g., inches or centimeters)      No bruising. 8. PAIN: "Is there any pain?" If so, ask: "How bad is it?"  (e.g., Scale 1-10; or mild, moderate, severe)     Yesterday it hurt but today it's not hurting today.  But I know something's not right. 9. TETANUS: For any breaks in the skin, ask: "When was the last tetanus booster?"     Not asked 10. OTHER SYMPTOMS: "Do you have any other symptoms?" (e.g., neck pain, vomiting)       No breaking out in a sweat or nausea.   I feel very sleepy. 11. PREGNANCY: "Is there any chance you are pregnant?" "When was your last menstrual period?"       N/A  Protocols used: HEAD INJURY-A-AH

## 2019-02-22 NOTE — Telephone Encounter (Signed)
Please see message. °

## 2019-02-22 NOTE — Telephone Encounter (Signed)
Agree with advice for ER evaluation

## 2019-02-22 NOTE — Telephone Encounter (Signed)
Pt advised to be evaluated in ED.

## 2019-02-23 ENCOUNTER — Emergency Department (HOSPITAL_COMMUNITY): Payer: Medicare HMO

## 2019-02-23 ENCOUNTER — Emergency Department (HOSPITAL_COMMUNITY)
Admission: EM | Admit: 2019-02-23 | Discharge: 2019-02-23 | Disposition: A | Discharge status: Home / Self Care | Payer: Medicare HMO | Attending: Emergency Medicine | Admitting: Emergency Medicine

## 2019-02-23 ENCOUNTER — Encounter (HOSPITAL_COMMUNITY): Payer: Self-pay | Admitting: *Deleted

## 2019-02-23 ENCOUNTER — Other Ambulatory Visit: Payer: Self-pay

## 2019-02-23 DIAGNOSIS — Z8709 Personal history of other diseases of the respiratory system: Secondary | ICD-10-CM | POA: Diagnosis not present

## 2019-02-23 DIAGNOSIS — I639 Cerebral infarction, unspecified: Secondary | ICD-10-CM | POA: Diagnosis not present

## 2019-02-23 DIAGNOSIS — R296 Repeated falls: Secondary | ICD-10-CM | POA: Insufficient documentation

## 2019-02-23 DIAGNOSIS — R270 Ataxia, unspecified: Secondary | ICD-10-CM | POA: Diagnosis not present

## 2019-02-23 DIAGNOSIS — R42 Dizziness and giddiness: Secondary | ICD-10-CM | POA: Insufficient documentation

## 2019-02-23 DIAGNOSIS — G464 Cerebellar stroke syndrome: Secondary | ICD-10-CM | POA: Diagnosis not present

## 2019-02-23 DIAGNOSIS — Z8546 Personal history of malignant neoplasm of prostate: Secondary | ICD-10-CM | POA: Diagnosis not present

## 2019-02-23 DIAGNOSIS — I10 Essential (primary) hypertension: Secondary | ICD-10-CM | POA: Diagnosis not present

## 2019-02-23 DIAGNOSIS — Z79899 Other long term (current) drug therapy: Secondary | ICD-10-CM | POA: Diagnosis not present

## 2019-02-23 LAB — BASIC METABOLIC PANEL
Anion gap: 11 (ref 5–15)
BUN: 27 mg/dL — ABNORMAL HIGH (ref 8–23)
CO2: 25 mmol/L (ref 22–32)
Calcium: 9.2 mg/dL (ref 8.9–10.3)
Chloride: 103 mmol/L (ref 98–111)
Creatinine, Ser: 1.77 mg/dL — ABNORMAL HIGH (ref 0.61–1.24)
GFR calc Af Amer: 43 mL/min — ABNORMAL LOW (ref >60)
GFR calc non Af Amer: 37 mL/min — ABNORMAL LOW (ref >60)
Glucose, Bld: 143 mg/dL — ABNORMAL HIGH (ref 70–99)
Potassium: 3.9 mmol/L (ref 3.5–5.1)
Sodium: 139 mmol/L (ref 135–145)

## 2019-02-23 LAB — CBC
HCT: 42.7 % (ref 39.0–52.0)
Hemoglobin: 13.9 g/dL (ref 13.0–17.0)
MCH: 29.5 pg (ref 26.0–34.0)
MCHC: 32.6 g/dL (ref 30.0–36.0)
MCV: 90.7 fL (ref 80.0–100.0)
Platelets: 173 10*3/uL (ref 150–400)
RBC: 4.71 MIL/uL (ref 4.22–5.81)
RDW: 12.5 % (ref 11.5–15.5)
WBC: 4.4 10*3/uL (ref 4.0–10.5)
nRBC: 0 % (ref 0.0–0.2)

## 2019-02-23 LAB — URINALYSIS, ROUTINE W REFLEX MICROSCOPIC
Bacteria, UA: NONE SEEN
Bilirubin Urine: NEGATIVE
Glucose, UA: NEGATIVE mg/dL
Ketones, ur: NEGATIVE mg/dL
Leukocytes,Ua: NEGATIVE
Nitrite: NEGATIVE
Protein, ur: NEGATIVE mg/dL
Specific Gravity, Urine: 1.016 (ref 1.005–1.030)
pH: 5 (ref 5.0–8.0)

## 2019-02-23 IMAGING — CT CT HEAD WITHOUT CONTRAST
4 series · 16 of 47 positions shown, 18 images · non-contrast
Comparison: None.

CLINICAL DATA: Ataxia dizziness

EXAM:
CT HEAD WITHOUT CONTRAST
TECHNIQUE: Contiguous axial images were obtained from the base of the skull
through the vertex without intravenous contrast.

[Series 2: head wo · axial · 0.47mm/px · z∈[-126,-6]mm · 7 of 34 slices shown, 9 images]
[im 5/34  brain]
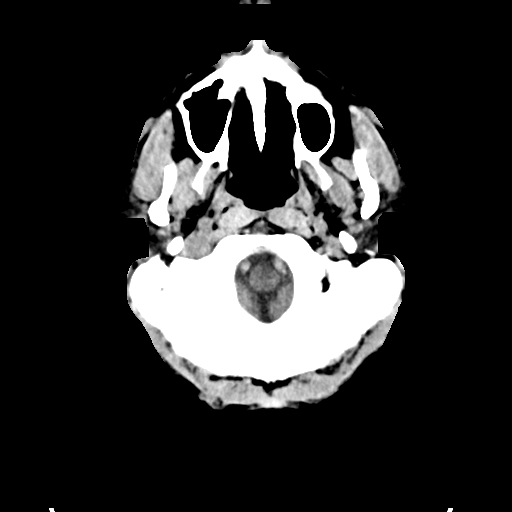
[im 5/34  bone]
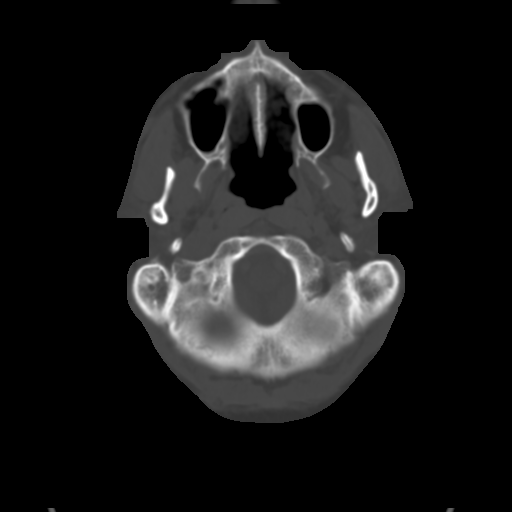
[im 9/34  brain]
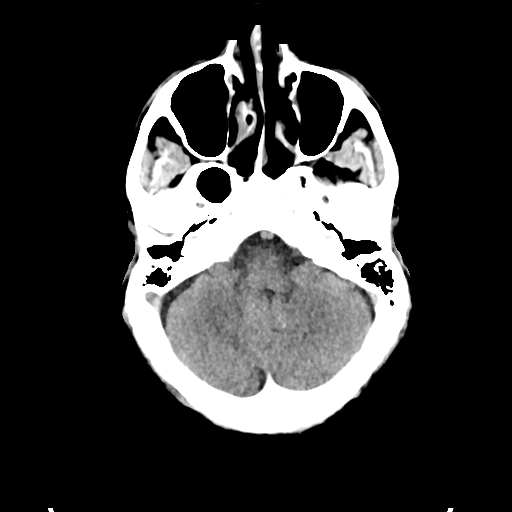
[im 13/34  brain]
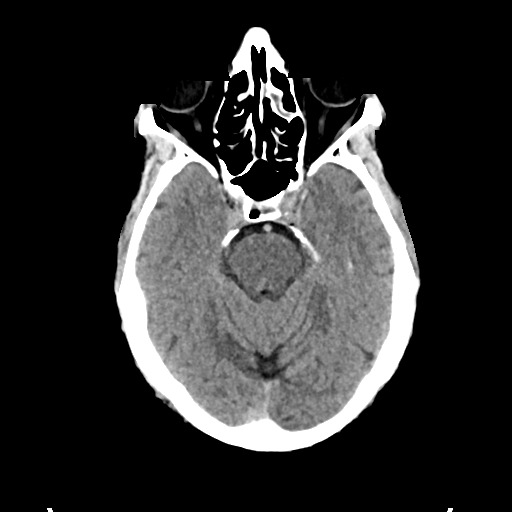
[im 17/34  brain]
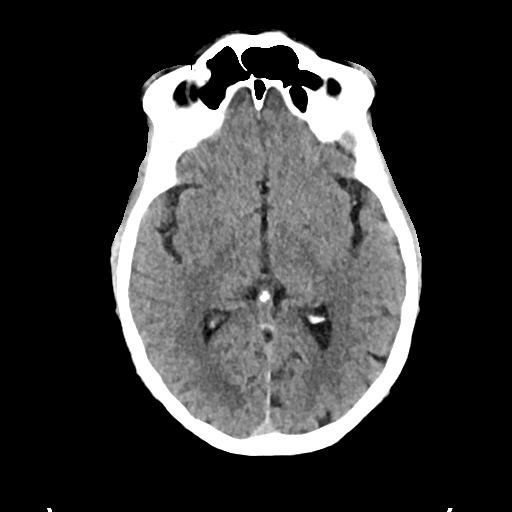
[im 21/34  brain]
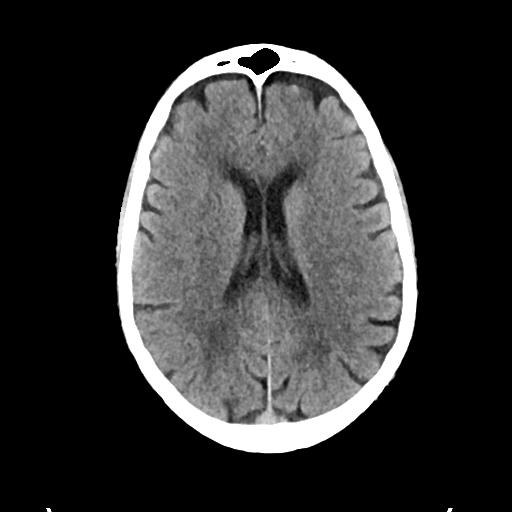
[im 21/34  bone]
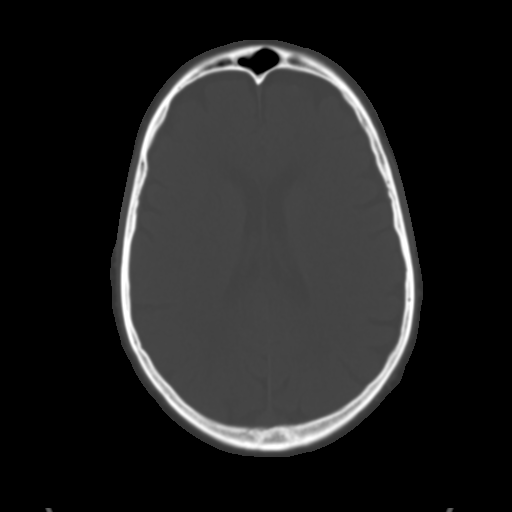
[im 25/34  brain]
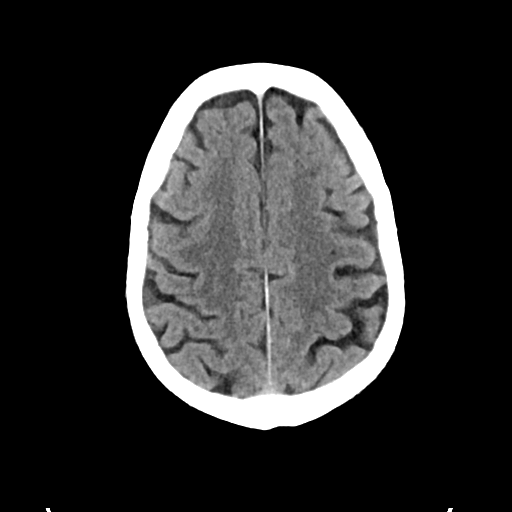
[im 29/34  brain]
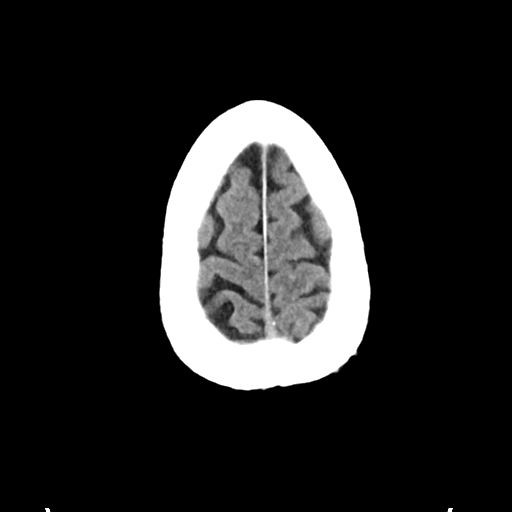

[Series 3: head bone · axial · 0.47mm/px · z∈[-130,-96]mm · 3 of 85 slices shown]
[im 9/85  bone]
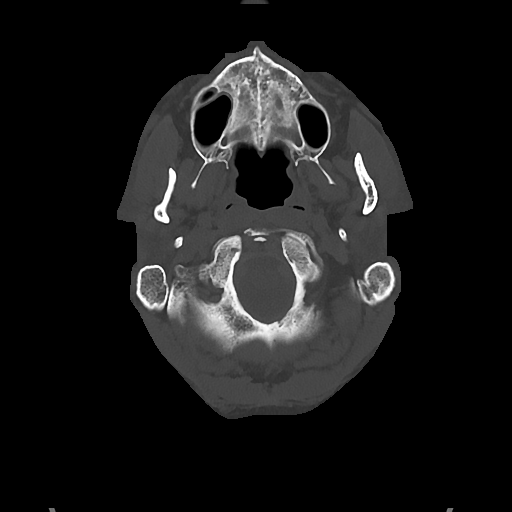
[im 17/85  bone]
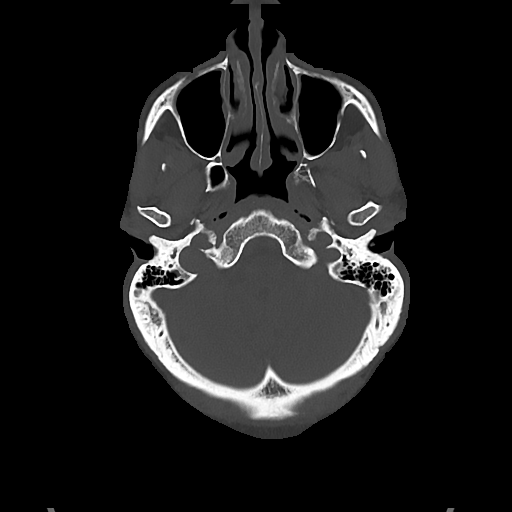
[im 26/85  bone]
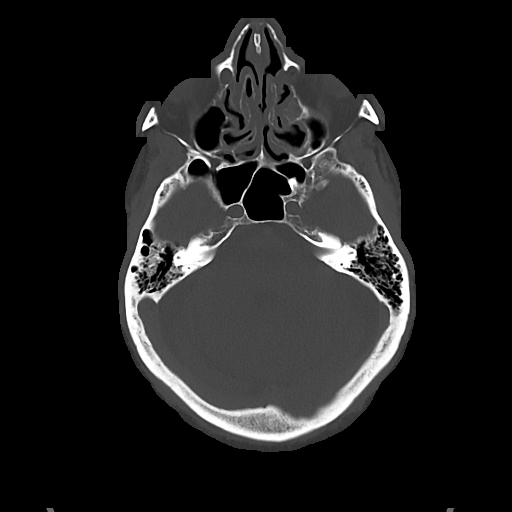

[Series 4: cor soft · coronal · 0.34mm/px · 3 of 78 slices shown]
[im 26/78  brain]
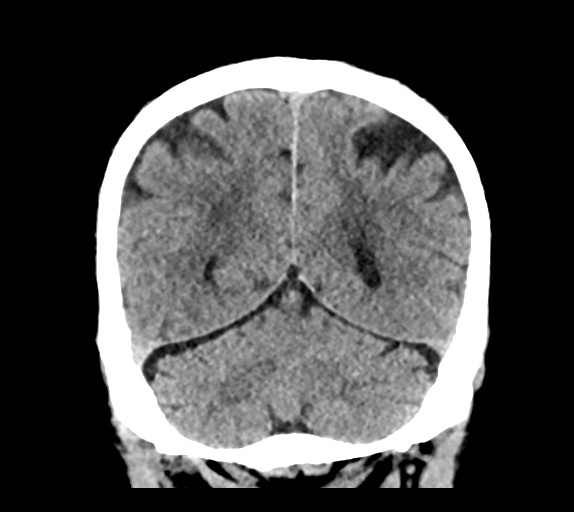
[im 35/78  brain]
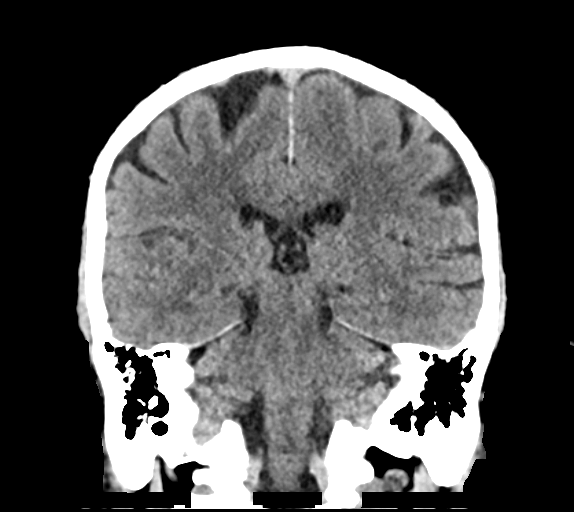
[im 43/78  brain]
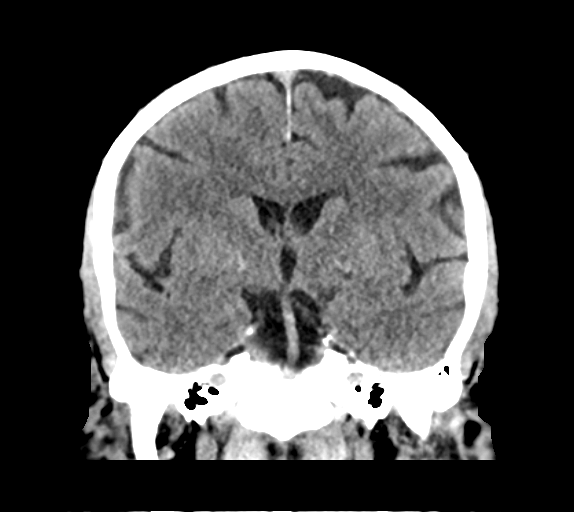

[Series 5: sag soft · sagittal · 0.34mm/px · 3 of 67 slices shown]
[im 23/67  brain]
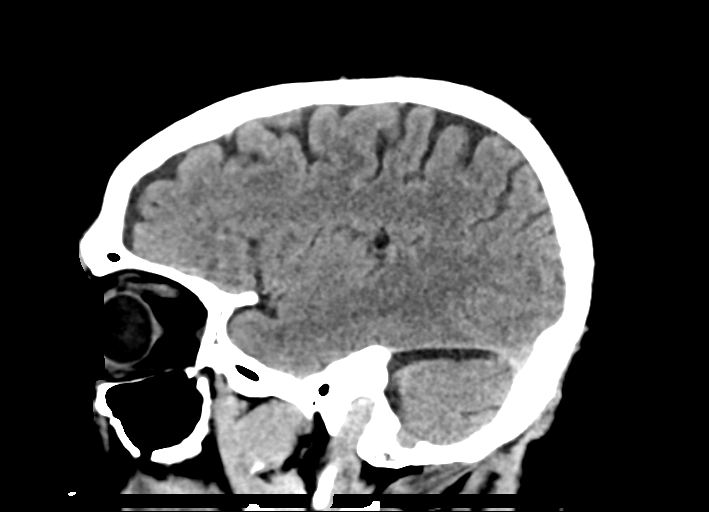
[im 34/67  brain]
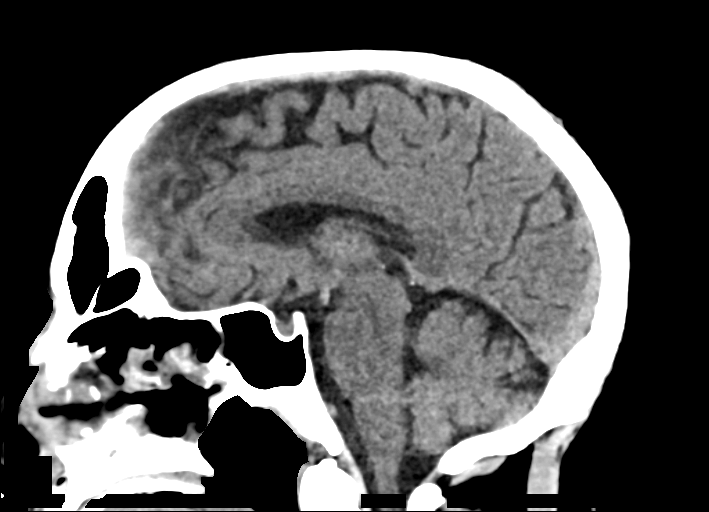
[im 45/67  brain]
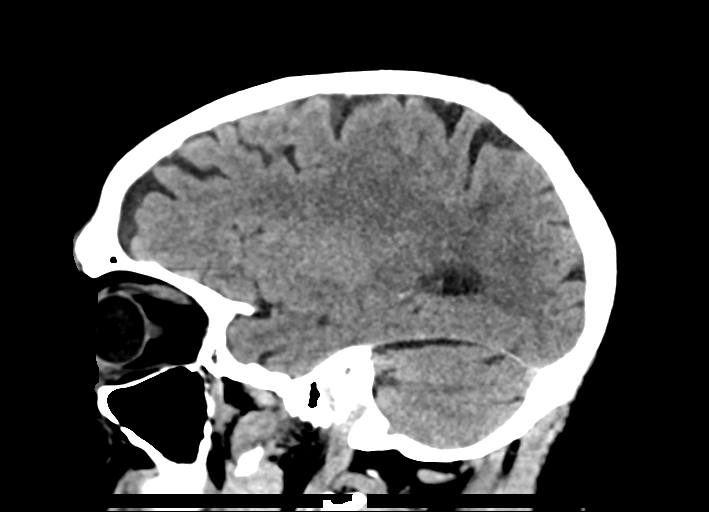

[16 of 47 positions shown; findings below may reference images not displayed]

FINDINGS: Brain: No acute territorial infarction, hemorrhage, or intracranial
mass. Mild atrophy. Mild to moderate small vessel ischemic changes
of the white matter. Nonenlarged ventricles.

Vascular: No hyperdense vessels.  No unexpected calcification

Skull: Normal. Negative for fracture or focal lesion.

Sinuses/Orbits: Mucosal thickening in the ethmoid and maxillary
sinuses

Other: None
IMPRESSION: 1. No CT evidence for acute intracranial abnormality.
2. Atrophy and small vessel ischemic changes of the white matter

## 2019-02-23 IMAGING — CT CT ANGIOGRAPHY HEAD
1 of 10 series · 1 of 16 positions shown · IV contrast (OMNI)
Comparison: [DATE] MRI of the head.

CLINICAL DATA: 73 y/o  M; multiple head injuries and dizziness.

EXAM:
CT ANGIOGRAPHY HEAD AND NECK
TECHNIQUE: Multidetector CT imaging of the head and neck was performed using
the standard protocol during bolus administration of intravenous
contrast. Multiplanar CT image reconstructions and MIPs were
obtained to evaluate the vascular anatomy. Carotid stenosis
measurements (when applicable) are obtained utilizing NASCET
criteria, using the distal internal carotid diameter as the
denominator.
CONTRAST:  75mL OMNIPAQUE IOHEXOL 350 MG/ML SOLN

[Series 6: carotid/brain 2.0 i30f 3 · axial · 0.50mm/px · 1 of 182 slices shown]
[im 1/182  soft-tissue]
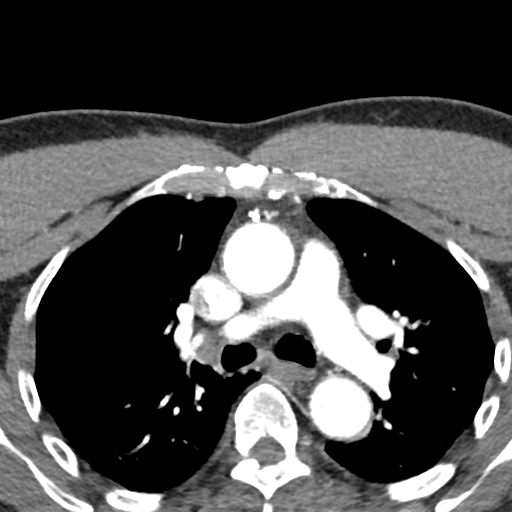

[1 of 16 positions shown; findings below may reference images not displayed]

FINDINGS: CTA NECK FINDINGS

Aortic arch: Standard branching. Imaged portion shows no evidence of
aneurysm or dissection. No significant stenosis of the major arch
vessel origins.

Right carotid system: No evidence of dissection, stenosis (50% or
greater) or occlusion.

Left carotid system: No evidence of dissection, stenosis (50% or
greater) or occlusion.

Vertebral arteries: Codominant. No evidence of dissection, stenosis
(50% or greater) or occlusion.

Skeleton: Cervical spondylosis with advanced discogenic degenerative
changes from C3-C7 and mild bilateral facet arthrosis. No acute
osseous abnormality.

Other neck: Negative.

Upper chest: Negative.

Review of the MIP images confirms the above findings

CTA HEAD FINDINGS

Anterior circulation: No significant stenosis, proximal occlusion,
aneurysm, or vascular malformation.

Posterior circulation: No significant stenosis, proximal occlusion,
aneurysm, or vascular malformation. Lower basilar fenestration.

Venous sinuses: As permitted by contrast timing, patent.

Anatomic variants: None significant.

Delayed phase: No abnormal intracranial enhancement.

Review of the MIP images confirms the above findings
IMPRESSION: 1. Patent carotid and vertebral arteries. No dissection, aneurysm,
or hemodynamically significant stenosis utilizing NASCET criteria.
2. Patent anterior and posterior intracranial circulation period. No
large vessel occlusion, aneurysm, or significant stenosis.

## 2019-02-23 IMAGING — DX CHEST  1 VIEW
1 series · 1 of 1 positions shown · non-contrast
Comparison: [DATE]

CLINICAL DATA: Dizzy

EXAM:
CHEST  1 VIEW

[chest pa]
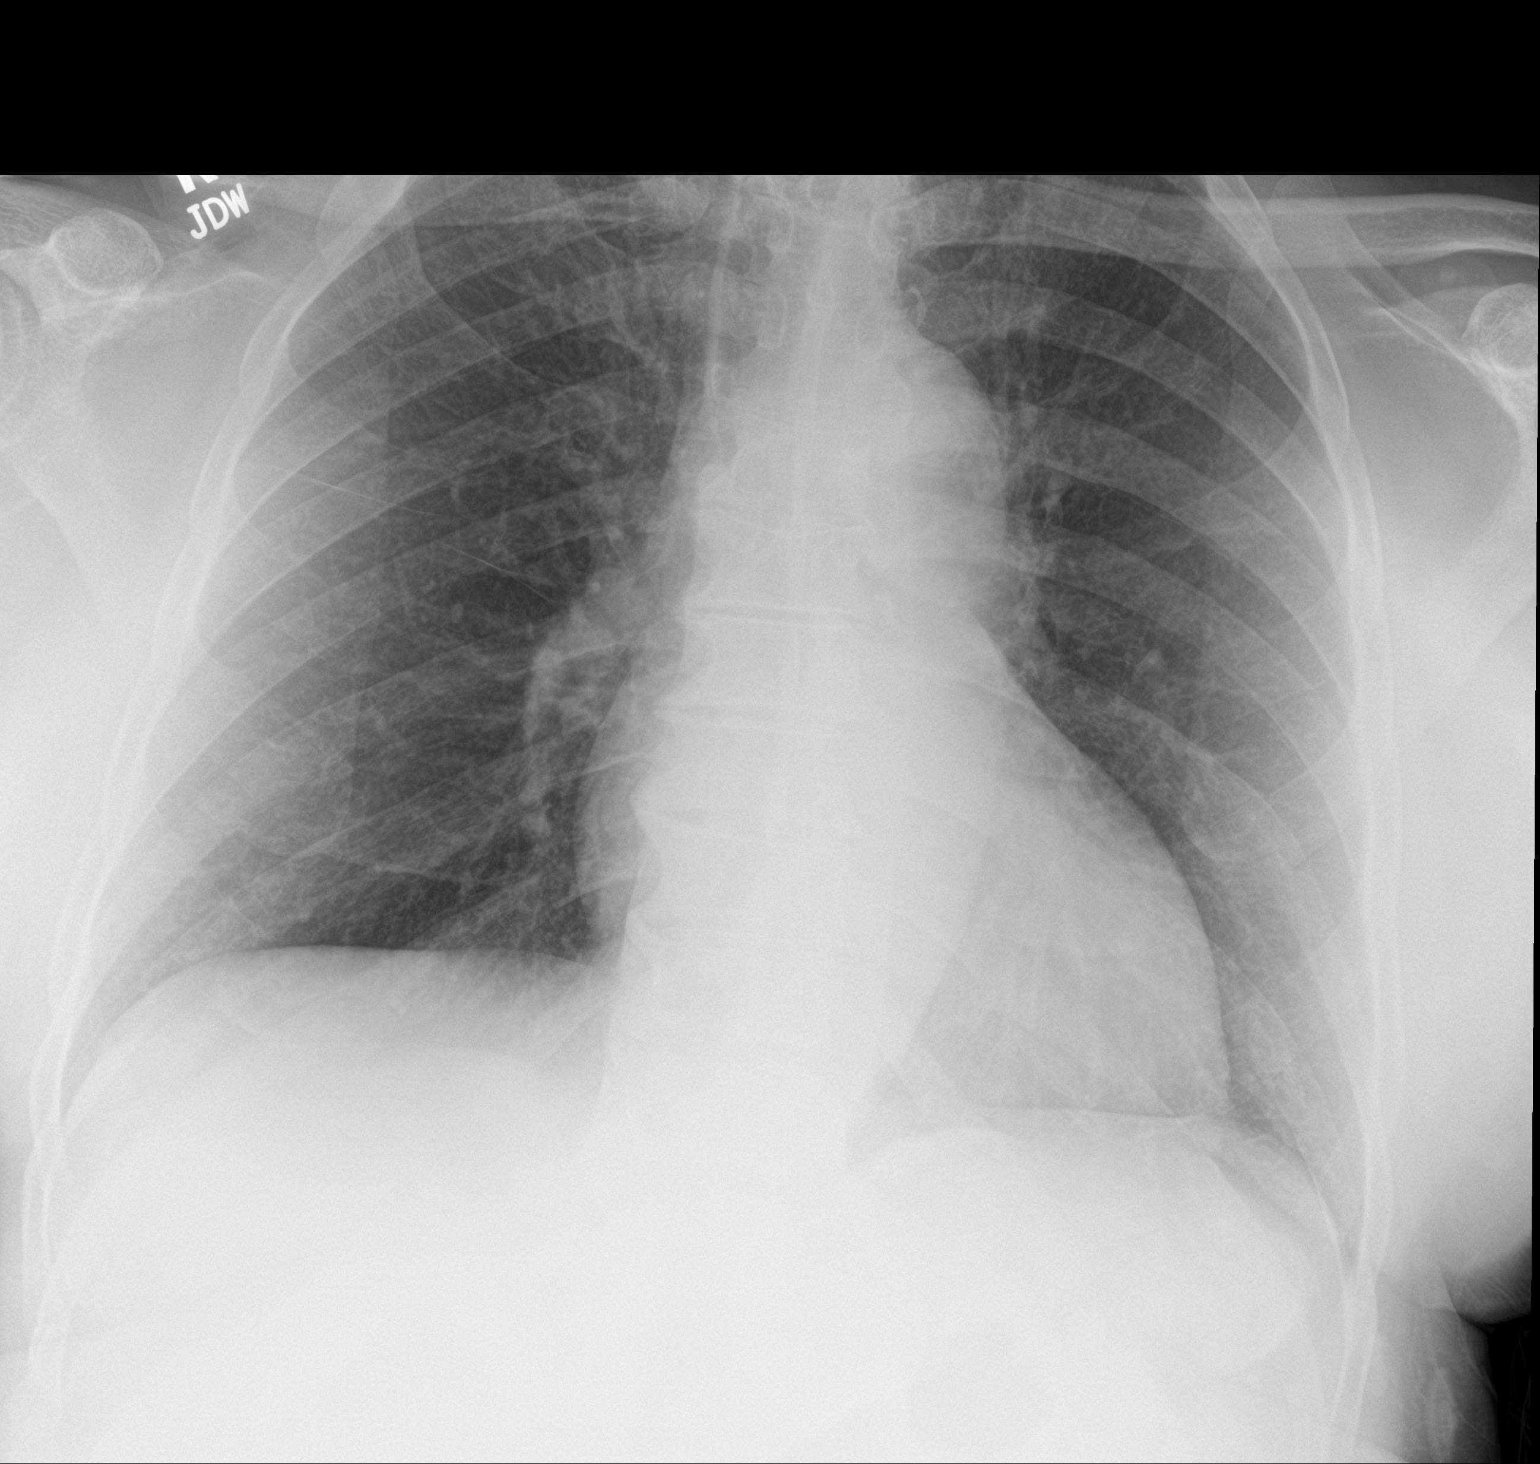

[1 of 1 positions shown; findings below may reference images not displayed]

FINDINGS: The heart size and mediastinal contours are within normal limits.
Both lungs are clear. The visualized skeletal structures are
unremarkable.
IMPRESSION: No active disease.

## 2019-02-23 IMAGING — MR MRI HEAD WITHOUT AND WITH CONTRAST
11 of 13 series · 38 of 48 positions shown · IV contrast (gadavist)
Comparison: Prior CT from earlier same day.

CLINICAL DATA: Initial evaluation for acute ataxia, stroke
suspected.

EXAM:
MRI HEAD WITHOUT AND WITH CONTRAST
TECHNIQUE: Multiplanar, multiecho pulse sequences of the brain and surrounding
structures were obtained without and with intravenous contrast.
CONTRAST:  80 cc of Gadavist.

[Series 5: DWI · axial · 3.0mm · 0.88mm/px · z∈[+2,+134]mm · 6 of 90 slices shown (1 of 4)]
[im 1/90]
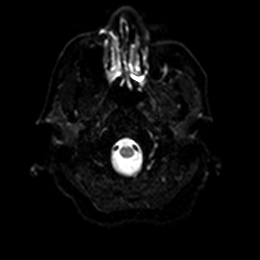
[im 18/90]
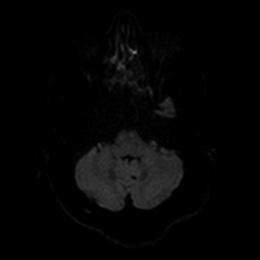
[im 36/90]
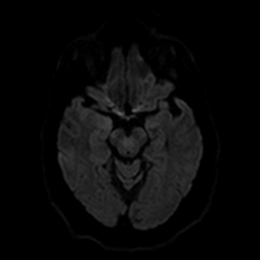
[im 54/90]
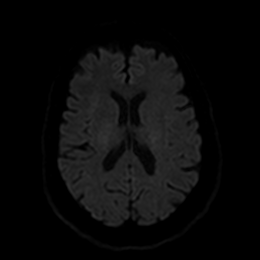
[im 72/90]
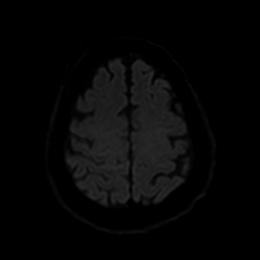
[im 90/90]
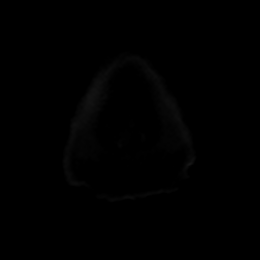

[Series 6: DWI · axial · 3.0mm · 0.88mm/px · z∈[+2,+134]mm · 4 of 45 slices shown (2 of 4)]
[im 1/45]
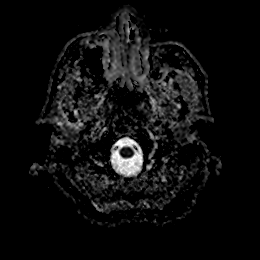
[im 15/45]
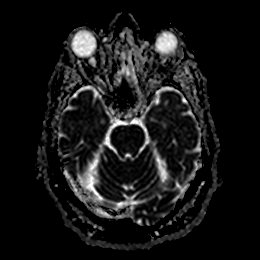
[im 30/45]
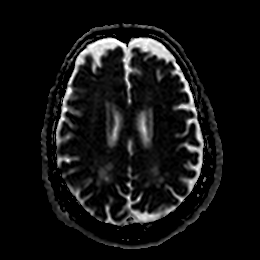
[im 45/45]
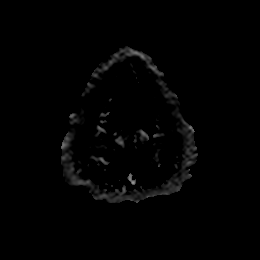

[Series 7: DWI · coronal · 4.0mm · 0.88mm/px · 5 of 64 slices shown (3 of 4)]
[im 1/64]
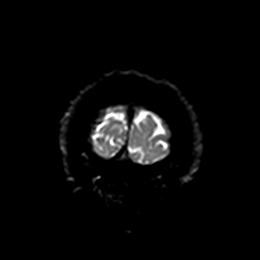
[im 16/64]
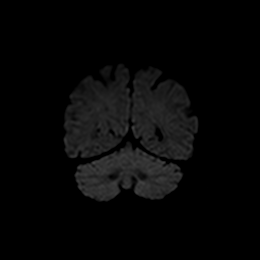
[im 32/64]
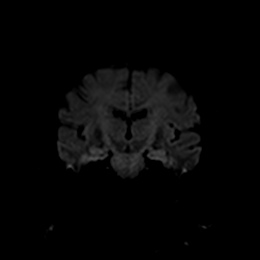
[im 48/64]
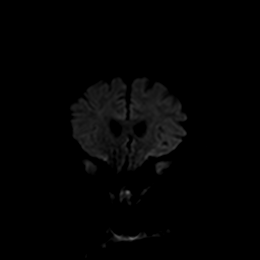
[im 64/64]
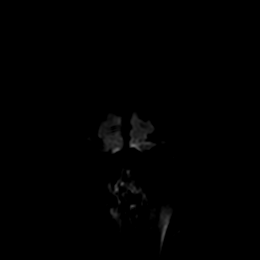

[Series 8: DWI · coronal · 4.0mm · 0.88mm/px · 3 of 32 slices shown (4 of 4)]
[im 1/32]
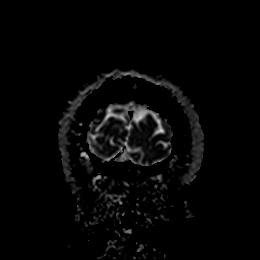
[im 16/32]
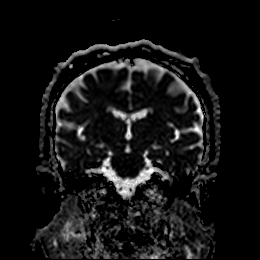
[im 32/32]
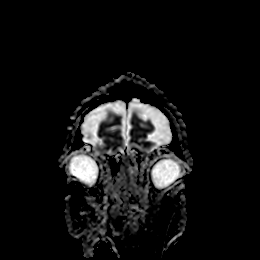

[Series 9: T1 · sagittal · 5.0mm · 0.75mm/px · 2 of 23 slices shown]
[im 1/23]
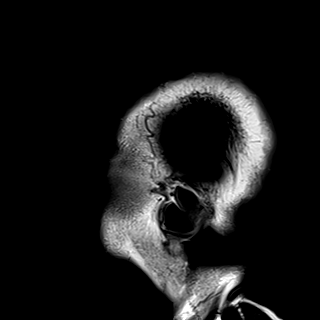
[im 23/23]
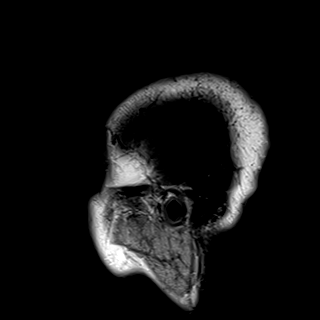

[Series 10: FLAIR · axial · 5.0mm · 0.45mm/px · z∈[-8,+136]mm · 2 of 25 slices shown]
[im 1/25]
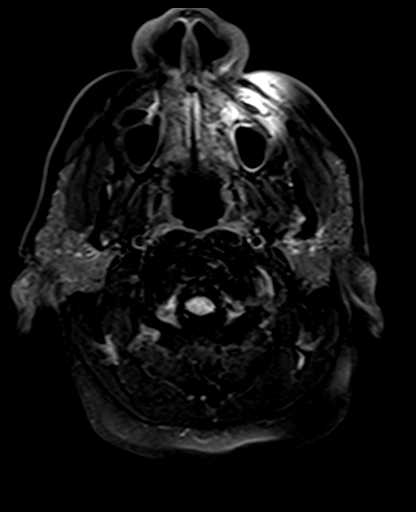
[im 25/25]
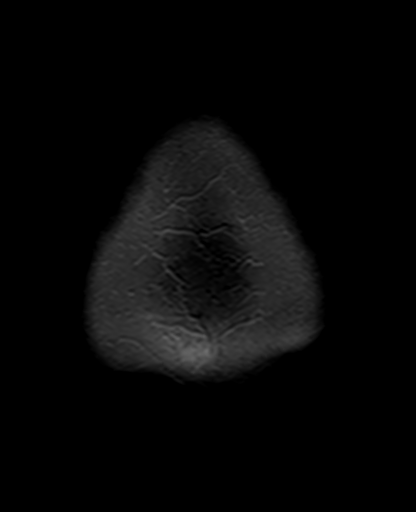

[Series 11: T2 · axial · 5.0mm · 0.72mm/px · z∈[-7,+137]mm · 2 of 25 slices shown]
[im 1/25]
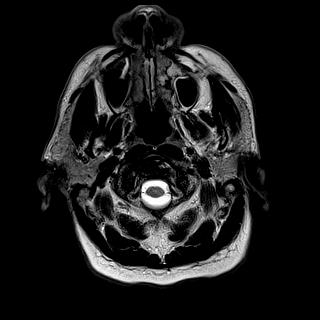
[im 25/25]
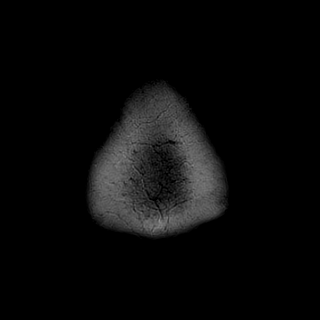

[Series 13: pha_images · axial · 3.0mm · 0.90mm/px · z∈[-19,+158]mm · 5 of 60 slices shown]
[im 1/60]
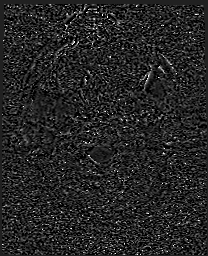
[im 15/60]
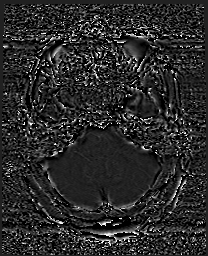
[im 30/60]
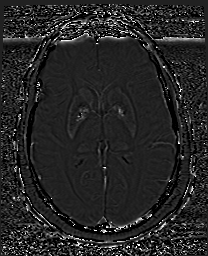
[im 45/60]
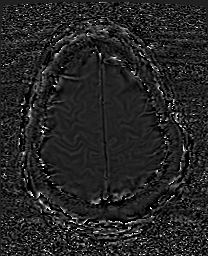
[im 60/60]
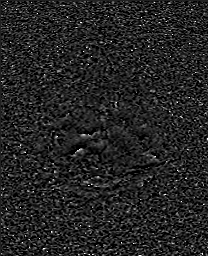

[Series 14: swi_images · axial · 3.0mm · 0.90mm/px · z∈[-19,+158]mm · 5 of 60 slices shown]
[im 1/60]
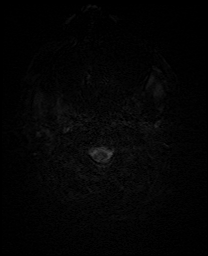
[im 15/60]
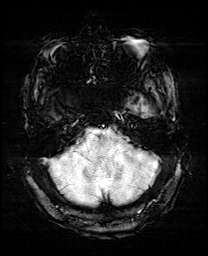
[im 30/60]
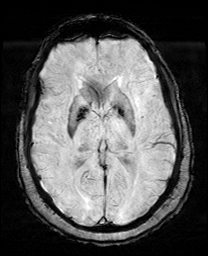
[im 45/60]
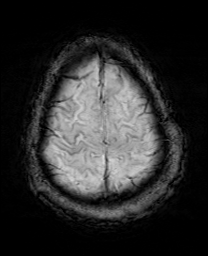
[im 60/60]
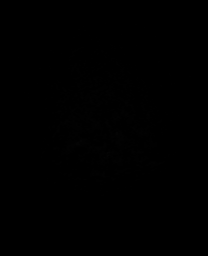

[Series 17: T2 post-contrast · coronal · 5.0mm · 0.72mm/px · 2 of 28 slices shown]
[im 1/28]
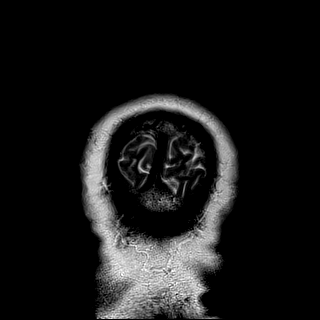
[im 28/28]
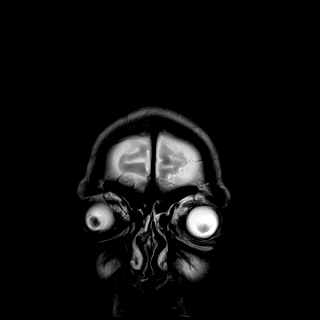

[Series 19: T1 post-contrast · coronal · 5.0mm · 0.34mm/px · 2 of 28 slices shown]
[im 1/28]
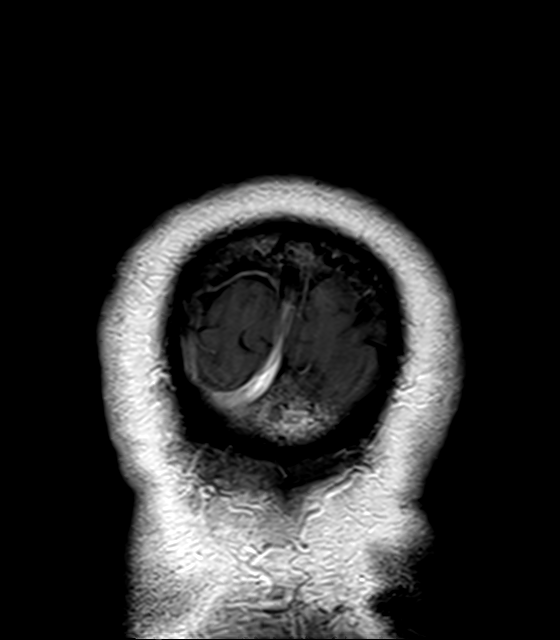
[im 28/28]
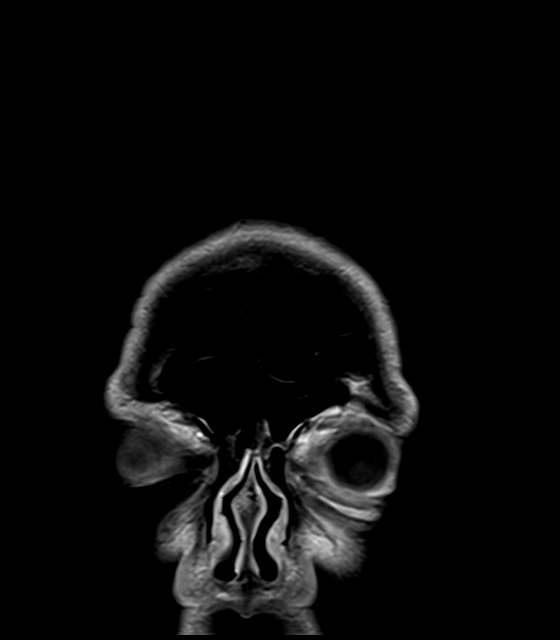

[38 of 48 positions shown; findings below may reference images not displayed]

FINDINGS: Brain: Generalized age-related cerebral atrophy. Patchy and
confluent T2/FLAIR hyperintensity within the periventricular deep
white matter both cerebral hemispheres, most like related chronic
small vessel ischemic disease, moderate nature. Single small remote
right cerebellar infarct noted (series 11, image 6).

No abnormal foci of restricted diffusion to suggest acute or
subacute ischemia. Gray-white matter differentiation maintained. No
other areas of remote cortical infarction. No evidence for acute or
chronic intracranial hemorrhage.

No mass lesion, midline shift or mass effect. No hydrocephalus. No
extra-axial fluid collection. No abnormal enhancement. Pituitary
gland suprasellar region normal. Midline structures intact.

Vascular: Major intracranial vascular flow voids maintained.

Skull and upper cervical spine: Craniocervical junction normal.
Degenerative spondylolysis noted within the upper cervical spine
with resultant mild diffuse spinal stenosis. Bone marrow signal
intensity within normal limits. No scalp soft tissue abnormality.

Sinuses/Orbits: Globes and orbital soft tissues within normal
limits. Moderate mucosal thickening within the ethmoidal air cells
and maxillary sinuses. No air-fluid level to suggest acute
sinusitis. Trace bilateral mastoid effusions noted, of doubtful
significance.

Other: None.
IMPRESSION: 1. No acute intracranial infarct or other abnormality identified.
2. Subcentimeter remote right cerebellar infarct.
3. Underlying age-related cerebral atrophy with moderate chronic
microvascular ischemic disease.

## 2019-02-23 MED ORDER — SODIUM CHLORIDE 0.9% FLUSH: 3.0000 mL | Freq: Once | INTRAVENOUS | Status: DC

## 2019-02-23 MED ORDER — GADOBUTROL 1 MMOL/ML IV SOLN
8.0000 mL | Freq: Once | INTRAVENOUS | Status: AC | PRN
  Administered 2019-02-23: 20:00:00 8 mL via INTRAVENOUS

## 2019-02-23 MED ORDER — IOHEXOL 350 MG/ML SOLN
75.0000 mL | Freq: Once | INTRAVENOUS | Status: AC | PRN
  Administered 2019-02-23: 22:00:00 75 mL via INTRAVENOUS

## 2019-02-23 MED ORDER — MECLIZINE HCL 25 MG PO TABS: 50.0000 mg | ORAL_TABLET | Freq: Once | ORAL | Status: DC

## 2019-02-23 MED ORDER — MECLIZINE HCL 25 MG PO TABS
25.0000 mg | ORAL_TABLET | Freq: Once | ORAL | Status: AC
  Administered 2019-02-23: 19:00:00 25 mg via ORAL
  Filled 2019-02-23: qty 1

## 2019-02-23 NOTE — ED Notes (Signed)
Patient transported to MRI 

## 2019-02-23 NOTE — ED Triage Notes (Signed)
Pt states that he struck his head on the fireplace mantle last month which caused him to pass out.  Pt has had some dizziness since this occurred.  Pt states that he almost passed out on Sunday. Pt states that he struck his head again yesterday (on the open trunk of the car fairly hard).  Pt states that the dizziness has been worse since he struck his head yesterday.  Pt spoke with RN and PCP and they advised him to come in and get a CT.  Pt also states that he is concerned that his BP has been elevated despite taking his BP meds as prescribed.  Pt is alert and oriented, no focal weakness.

## 2019-02-23 NOTE — ED Notes (Signed)
Patient transported to CT 

## 2019-02-23 NOTE — Discharge Instructions (Addendum)
Thank you for allowing me to care for you today. Please return to the emergency department if you have new or worsening symptoms. Take your medications as instructed.  ° °

## 2019-02-23 NOTE — ED Provider Notes (Signed)
Care transferred to me.  Patient states his dizziness has resolved.  Based on negative CT angiography of head and neck and prior plan with neurology and previous team, he appears stable for discharge home.  We discussed return precautions.  Results for orders placed or performed during the hospital encounter of 69/67/89  Basic metabolic panel  Result Value Ref Range   Sodium 139 135 - 145 mmol/L   Potassium 3.9 3.5 - 5.1 mmol/L   Chloride 103 98 - 111 mmol/L   CO2 25 22 - 32 mmol/L   Glucose, Bld 143 (H) 70 - 99 mg/dL   BUN 27 (H) 8 - 23 mg/dL   Creatinine, Ser 1.77 (H) 0.61 - 1.24 mg/dL   Calcium 9.2 8.9 - 10.3 mg/dL   GFR calc non Af Amer 37 (L) >60 mL/min   GFR calc Af Amer 43 (L) >60 mL/min   Anion gap 11 5 - 15  CBC  Result Value Ref Range   WBC 4.4 4.0 - 10.5 K/uL   RBC 4.71 4.22 - 5.81 MIL/uL   Hemoglobin 13.9 13.0 - 17.0 g/dL   HCT 42.7 39.0 - 52.0 %   MCV 90.7 80.0 - 100.0 fL   MCH 29.5 26.0 - 34.0 pg   MCHC 32.6 30.0 - 36.0 g/dL   RDW 12.5 11.5 - 15.5 %   Platelets 173 150 - 400 K/uL   nRBC 0.0 0.0 - 0.2 %  Urinalysis, Routine w reflex microscopic  Result Value Ref Range   Color, Urine YELLOW YELLOW   APPearance CLEAR CLEAR   Specific Gravity, Urine 1.016 1.005 - 1.030   pH 5.0 5.0 - 8.0   Glucose, UA NEGATIVE NEGATIVE mg/dL   Hgb urine dipstick SMALL (A) NEGATIVE   Bilirubin Urine NEGATIVE NEGATIVE   Ketones, ur NEGATIVE NEGATIVE mg/dL   Protein, ur NEGATIVE NEGATIVE mg/dL   Nitrite NEGATIVE NEGATIVE   Leukocytes,Ua NEGATIVE NEGATIVE   RBC / HPF 0-5 0 - 5 RBC/hpf   WBC, UA 0-5 0 - 5 WBC/hpf   Bacteria, UA NONE SEEN NONE SEEN   Ct Angio Head W Or Wo Contrast  Result Date: 02/23/2019 CLINICAL DATA:  73 y/o  M; multiple head injuries and dizziness. EXAM: CT ANGIOGRAPHY HEAD AND NECK TECHNIQUE: Multidetector CT imaging of the head and neck was performed using the standard protocol during bolus administration of intravenous contrast. Multiplanar CT image  reconstructions and MIPs were obtained to evaluate the vascular anatomy. Carotid stenosis measurements (when applicable) are obtained utilizing NASCET criteria, using the distal internal carotid diameter as the denominator. CONTRAST:  18mL OMNIPAQUE IOHEXOL 350 MG/ML SOLN COMPARISON:  02/23/2019 MRI of the head. FINDINGS: CTA NECK FINDINGS Aortic arch: Standard branching. Imaged portion shows no evidence of aneurysm or dissection. No significant stenosis of the major arch vessel origins. Right carotid system: No evidence of dissection, stenosis (50% or greater) or occlusion. Left carotid system: No evidence of dissection, stenosis (50% or greater) or occlusion. Vertebral arteries: Codominant. No evidence of dissection, stenosis (50% or greater) or occlusion. Skeleton: Cervical spondylosis with advanced discogenic degenerative changes from C3-C7 and mild bilateral facet arthrosis. No acute osseous abnormality. Other neck: Negative. Upper chest: Negative. Review of the MIP images confirms the above findings CTA HEAD FINDINGS Anterior circulation: No significant stenosis, proximal occlusion, aneurysm, or vascular malformation. Posterior circulation: No significant stenosis, proximal occlusion, aneurysm, or vascular malformation. Lower basilar fenestration. Venous sinuses: As permitted by contrast timing, patent. Anatomic variants: None significant. Delayed phase: No abnormal  intracranial enhancement. Review of the MIP images confirms the above findings IMPRESSION: 1. Patent carotid and vertebral arteries. No dissection, aneurysm, or hemodynamically significant stenosis utilizing NASCET criteria. 2. Patent anterior and posterior intracranial circulation period. No large vessel occlusion, aneurysm, or significant stenosis. Electronically Signed   By: Kristine Garbe M.D.   On: 02/23/2019 22:07   Dg Chest 1 View  Result Date: 02/23/2019 CLINICAL DATA:  Dizzy EXAM: CHEST  1 VIEW COMPARISON:  10/20/2013  FINDINGS: The heart size and mediastinal contours are within normal limits. Both lungs are clear. The visualized skeletal structures are unremarkable. IMPRESSION: No active disease. Electronically Signed   By: Donavan Foil M.D.   On: 02/23/2019 18:45   Ct Head Wo Contrast  Result Date: 02/23/2019 CLINICAL DATA:  Ataxia dizziness EXAM: CT HEAD WITHOUT CONTRAST TECHNIQUE: Contiguous axial images were obtained from the base of the skull through the vertex without intravenous contrast. COMPARISON:  None. FINDINGS: Brain: No acute territorial infarction, hemorrhage, or intracranial mass. Mild atrophy. Mild to moderate small vessel ischemic changes of the white matter. Nonenlarged ventricles. Vascular: No hyperdense vessels.  No unexpected calcification Skull: Normal. Negative for fracture or focal lesion. Sinuses/Orbits: Mucosal thickening in the ethmoid and maxillary sinuses Other: None IMPRESSION: 1. No CT evidence for acute intracranial abnormality. 2. Atrophy and small vessel ischemic changes of the white matter Electronically Signed   By: Donavan Foil M.D.   On: 02/23/2019 18:45   Ct Angio Neck W And/or Wo Contrast  Result Date: 02/23/2019 CLINICAL DATA:  73 y/o  M; multiple head injuries and dizziness. EXAM: CT ANGIOGRAPHY HEAD AND NECK TECHNIQUE: Multidetector CT imaging of the head and neck was performed using the standard protocol during bolus administration of intravenous contrast. Multiplanar CT image reconstructions and MIPs were obtained to evaluate the vascular anatomy. Carotid stenosis measurements (when applicable) are obtained utilizing NASCET criteria, using the distal internal carotid diameter as the denominator. CONTRAST:  19mL OMNIPAQUE IOHEXOL 350 MG/ML SOLN COMPARISON:  02/23/2019 MRI of the head. FINDINGS: CTA NECK FINDINGS Aortic arch: Standard branching. Imaged portion shows no evidence of aneurysm or dissection. No significant stenosis of the major arch vessel origins. Right carotid  system: No evidence of dissection, stenosis (50% or greater) or occlusion. Left carotid system: No evidence of dissection, stenosis (50% or greater) or occlusion. Vertebral arteries: Codominant. No evidence of dissection, stenosis (50% or greater) or occlusion. Skeleton: Cervical spondylosis with advanced discogenic degenerative changes from C3-C7 and mild bilateral facet arthrosis. No acute osseous abnormality. Other neck: Negative. Upper chest: Negative. Review of the MIP images confirms the above findings CTA HEAD FINDINGS Anterior circulation: No significant stenosis, proximal occlusion, aneurysm, or vascular malformation. Posterior circulation: No significant stenosis, proximal occlusion, aneurysm, or vascular malformation. Lower basilar fenestration. Venous sinuses: As permitted by contrast timing, patent. Anatomic variants: None significant. Delayed phase: No abnormal intracranial enhancement. Review of the MIP images confirms the above findings IMPRESSION: 1. Patent carotid and vertebral arteries. No dissection, aneurysm, or hemodynamically significant stenosis utilizing NASCET criteria. 2. Patent anterior and posterior intracranial circulation period. No large vessel occlusion, aneurysm, or significant stenosis. Electronically Signed   By: Kristine Garbe M.D.   On: 02/23/2019 22:07   Mr Brain W And Wo Contrast  Result Date: 02/23/2019 CLINICAL DATA:  Initial evaluation for acute ataxia, stroke suspected. EXAM: MRI HEAD WITHOUT AND WITH CONTRAST TECHNIQUE: Multiplanar, multiecho pulse sequences of the brain and surrounding structures were obtained without and with intravenous contrast. CONTRAST:  80 cc of  Gadavist. COMPARISON:  Prior CT from earlier same day. FINDINGS: Brain: Generalized age-related cerebral atrophy. Patchy and confluent T2/FLAIR hyperintensity within the periventricular deep white matter both cerebral hemispheres, most like related chronic small vessel ischemic disease,  moderate nature. Single small remote right cerebellar infarct noted (series 11, image 6). No abnormal foci of restricted diffusion to suggest acute or subacute ischemia. Gray-white matter differentiation maintained. No other areas of remote cortical infarction. No evidence for acute or chronic intracranial hemorrhage. No mass lesion, midline shift or mass effect. No hydrocephalus. No extra-axial fluid collection. No abnormal enhancement. Pituitary gland suprasellar region normal. Midline structures intact. Vascular: Major intracranial vascular flow voids maintained. Skull and upper cervical spine: Craniocervical junction normal. Degenerative spondylolysis noted within the upper cervical spine with resultant mild diffuse spinal stenosis. Bone marrow signal intensity within normal limits. No scalp soft tissue abnormality. Sinuses/Orbits: Globes and orbital soft tissues within normal limits. Moderate mucosal thickening within the ethmoidal air cells and maxillary sinuses. No air-fluid level to suggest acute sinusitis. Trace bilateral mastoid effusions noted, of doubtful significance. Other: None. IMPRESSION: 1. No acute intracranial infarct or other abnormality identified. 2. Subcentimeter remote right cerebellar infarct. 3. Underlying age-related cerebral atrophy with moderate chronic microvascular ischemic disease. Electronically Signed   By: Jeannine Boga M.D.   On: 02/23/2019 20:54      Sherwood Gambler, MD 02/23/19 2256

## 2019-02-23 NOTE — ED Notes (Signed)
Pt ambulated inside the room for 3 minutes with steady gait. Steady on feet. Reports a slight dizziness when sitting down.

## 2019-02-23 NOTE — ED Notes (Signed)
Pt back from CT

## 2019-02-23 NOTE — ED Provider Notes (Signed)
[facility] Provider Note   CSN: [id number] Arrival date & time: 02/23/19  1518    History   Chief Complaint Chief Complaint  Patient presents with   Dizziness    HPI [facility] is a 73 y.o. male.     Patient is a 73 year old male with past medical history of prostate cancer, hypertension, GERD, asthma who presents emergency department for falls and lightheadedness.  Patient reports that about 1 month ago he was bending down to pick something off of his fireplace mantle when he hit his head on the side of it.  Reports that he passed out for a small amount of time and woke up and felt like his normal self.  Reports that again on Sunday he felt somewhat lightheaded and when he went to bend down and get something from the trunk of his car he hit his head on the trunk of his car.  Reports feeling lightheaded with standing and walking.  Denies any chest pain, shortness of breath, fever or URI symptoms.  Denies any numbness or weakness in his extremities, face, slurred speech, confusion or memory loss.     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   Complication of anesthesia    woke up "fighting"  confusion   GERD 01/18/2009   HYPERTENSION 01/18/2009   HYPOGONADISM 01/18/2009    Patient Active Problem List   Diagnosis Date Noted   Bilateral renal cysts 08/20/2017   Adenocarcinoma of prostate ([place]) 03/31/2017   Primary osteoarthritis of right knee 06/11/2016   Acute constipation 12/20/2013   CKD (chronic kidney disease) stage 3, GFR 30-59 ml/min ([place]) 12/02/2012   Elevated PSA 12/27/2010   CHRONIC RHINITIS 10/09/2010   COUGH 10/09/2010   ALLERGIC RHINITIS 09/03/2010   BPH (benign  prostatic hyperplasia) 04/04/2009   Low testosterone 01/18/2009   Essential hypertension 01/18/2009   Asthma 01/18/2009   GERD 01/18/2009    Past Surgical History:  Procedure Laterality Date   COLONOSCOPY     INGUINAL HERNIA REPAIR Right [facility]   PARTIAL KNEE ARTHROPLASTY Right 07/22/2016   Procedure: UNICOMPARTMENTAL KNEE ARTHROPLASTY;  Surgeon: Renette Butters, MD;  Location: [facility];  Service: Orthopedics;  Laterality: Right;   PROSTATE BIOPSY  X 5   REPLACEMENT UNICONDYLAR JOINT KNEE Right 07/22/2016   TRANSURETHRAL RESECTION OF PROSTATE  12/2014        Home Medications    Prior to Admission medications   Medication Sig Start Date End Date Taking? Authorizing Provider  amLODipine (NORVASC) 10 MG tablet Take 1 tablet (10 mg total) by mouth daily. Patient taking differently: Take 10 mg by mouth. Half tab daily 09/02/17   Burchette, Alinda Sierras, MD  atorvastatin (LIPITOR) 10 MG tablet Take 1 tablet (10 mg total) by mouth daily at 6 PM. 01/05/19   Burchette, Alinda Sierras, MD  losartan (COZAAR) 50 MG tablet Take 1 tablet (50 mg total) by mouth daily. 02/15/19   Burchette, Alinda Sierras, MD    Family History Family History  Problem Relation Age of Onset   Stomach cancer Paternal Aunt    Colon cancer Neg Hx    Esophageal cancer Neg Hx  Rectal cancer Neg Hx     Social History Social History   Tobacco Use   Smoking status: Former Smoker    Types: Cigarettes   Smokeless tobacco: Never Used   Tobacco comment: "quit smoking in the late 1960's; smoked < 1 pack cigarettes TOTAL"  Substance Use Topics   Alcohol use: Yes    Alcohol/week: 1.0 standard drinks    Types: 1 Cans of beer per week    Comment: none   Drug use: No     Allergies   Flomax [tamsulosin hcl] and Amlodipine   Review of Systems Review of Systems  Constitutional: Positive for unexpected weight change (6lb weight loss). Negative for appetite change, chills and fatigue.   HENT: Negative for ear discharge, ear pain, hearing loss and tinnitus.   Eyes: Negative for photophobia and visual disturbance.  Respiratory: Negative for cough and shortness of breath.   Cardiovascular: Negative for chest pain, palpitations and leg swelling.  Gastrointestinal: Negative for abdominal pain, nausea and vomiting.  Genitourinary: Negative for dysuria and hematuria.  Musculoskeletal: Negative for arthralgias, back pain and joint swelling.  Skin: Negative for rash and wound.  Neurological: Positive for syncope, light-headedness and headaches. Negative for dizziness, seizures, speech difficulty and weakness.  Psychiatric/Behavioral: Negative for confusion.     Physical Exam Updated Vital Signs BP 138/75    Pulse (!) 56    Temp 97.7 F (36.5 C) (Oral)    Resp 18    Ht 6' (1.829 m)    Wt 88 kg    SpO2 99%    BMI 26.31 kg/m   Physical Exam Vitals signs and nursing note reviewed.  Constitutional:      General: He is not in acute distress.    Appearance: Normal appearance. He is not ill-appearing, toxic-appearing or diaphoretic.     Comments: Pleasant, friendly, elderly male  HENT:     Head: Normocephalic and atraumatic.     Nose: Nose normal. No congestion or rhinorrhea.     Mouth/Throat:     Mouth: Mucous membranes are moist.  Eyes:     Extraocular Movements: Extraocular movements intact.     Conjunctiva/sclera: Conjunctivae normal.     Pupils: Pupils are equal, round, and reactive to light.  Neck:     Musculoskeletal: Normal range of motion.  Cardiovascular:     Rate and Rhythm: Normal rate and regular rhythm.  Skin:    General: Skin is warm.  Neurological:     General: No focal deficit present.     Mental Status: He is alert.     Cranial Nerves: No cranial nerve deficit.     Sensory: No sensory deficit.     Motor: No weakness.     Coordination: Coordination normal.     Gait: Gait normal.  Psychiatric:        Mood and Affect: Mood normal.      ED  Treatments / Results  Labs (all labs ordered are listed, but only abnormal results are displayed) Labs Reviewed  BASIC METABOLIC PANEL - Abnormal; Notable for the following components:      Result Value   Glucose, Bld 143 (*)    BUN 27 (*)    Creatinine, Ser 1.77 (*)    GFR calc non Af Amer 37 (*)    GFR calc Af Amer 43 (*)    All other components within normal limits  URINALYSIS, ROUTINE W REFLEX MICROSCOPIC - Abnormal; Notable for the following components:   Hgb urine dipstick  SMALL (*)    All other components within normal limits  URINE CULTURE  CBC  CBG MONITORING, ED    EKG None  Radiology Dg Chest 1 View  Result Date: 02/23/2019 CLINICAL DATA:  Dizzy EXAM: CHEST  1 VIEW COMPARISON:  10/20/2013 FINDINGS: The heart size and mediastinal contours are within normal limits. Both lungs are clear. The visualized skeletal structures are unremarkable. IMPRESSION: No active disease. Electronically Signed   By: Donavan Foil M.D.   On: 02/23/2019 18:45   Ct Head Wo Contrast  Result Date: 02/23/2019 CLINICAL DATA:  Ataxia dizziness EXAM: CT HEAD WITHOUT CONTRAST TECHNIQUE: Contiguous axial images were obtained from the base of the skull through the vertex without intravenous contrast. COMPARISON:  None. FINDINGS: Brain: No acute territorial infarction, hemorrhage, or intracranial mass. Mild atrophy. Mild to moderate small vessel ischemic changes of the white matter. Nonenlarged ventricles. Vascular: No hyperdense vessels.  No unexpected calcification Skull: Normal. Negative for fracture or focal lesion. Sinuses/Orbits: Mucosal thickening in the ethmoid and maxillary sinuses Other: None IMPRESSION: 1. No CT evidence for acute intracranial abnormality. 2. Atrophy and small vessel ischemic changes of the white matter Electronically Signed   By: Donavan Foil M.D.   On: 02/23/2019 18:45   Mr Brain W And Wo Contrast  Result Date: 02/23/2019 CLINICAL DATA:  Initial evaluation for acute ataxia,  stroke suspected. EXAM: MRI HEAD WITHOUT AND WITH CONTRAST TECHNIQUE: Multiplanar, multiecho pulse sequences of the brain and surrounding structures were obtained without and with intravenous contrast. CONTRAST:  80 cc of Gadavist. COMPARISON:  Prior CT from earlier same day. FINDINGS: Brain: Generalized age-related cerebral atrophy. Patchy and confluent T2/FLAIR hyperintensity within the periventricular deep white matter both cerebral hemispheres, most like related chronic small vessel ischemic disease, moderate nature. Single small remote right cerebellar infarct noted (series 11, image 6). No abnormal foci of restricted diffusion to suggest acute or subacute ischemia. Gray-white matter differentiation maintained. No other areas of remote cortical infarction. No evidence for acute or chronic intracranial hemorrhage. No mass lesion, midline shift or mass effect. No hydrocephalus. No extra-axial fluid collection. No abnormal enhancement. Pituitary gland suprasellar region normal. Midline structures intact. Vascular: Major intracranial vascular flow voids maintained. Skull and upper cervical spine: Craniocervical junction normal. Degenerative spondylolysis noted within the upper cervical spine with resultant mild diffuse spinal stenosis. Bone marrow signal intensity within normal limits. No scalp soft tissue abnormality. Sinuses/Orbits: Globes and orbital soft tissues within normal limits. Moderate mucosal thickening within the ethmoidal air cells and maxillary sinuses. No air-fluid level to suggest acute sinusitis. Trace bilateral mastoid effusions noted, of doubtful significance. Other: None. IMPRESSION: 1. No acute intracranial infarct or other abnormality identified. 2. Subcentimeter remote right cerebellar infarct. 3. Underlying age-related cerebral atrophy with moderate chronic microvascular ischemic disease. Electronically Signed   By: Jeannine Boga M.D.   On: 02/23/2019 20:54     Procedures Procedures (including critical care time)  Medications Ordered in ED Medications  sodium chloride flush (NS) 0.9 % injection 3 mL (3 mLs Intravenous Not Given 02/23/19 1858)  meclizine (ANTIVERT) tablet 25 mg (25 mg Oral Given 02/23/19 1859)  gadobutrol (GADAVIST) 1 MMOL/ML injection 8 mL (8 mLs Intravenous Contrast Given 02/23/19 2027)     Initial Impression / Assessment and Plan / ED Course  I have reviewed the triage vital signs and the nursing notes.  Pertinent labs & imaging results that were available during my care of the patient were reviewed by me and considered in my medical  decision making (see chart for details).  Clinical Course as of Feb 23 2119  Wed Feb 22, 8662  5334 73 year old male presenting with lightheadedness and dizziness.  This is been going on for the 1 month resulting in 2 falls where he has hit his head.  Differential includes orthostatic hypotension, posterior stroke, intracranial bleed, postconcussion syndrome.   [KM]  1946 Patient had normal orthostatic vitals with a BMP that was at his baseline.  CBC unremarkable. EKG unremarkable.  Head CT and chest x-ray are also unremarkable.  Will obtain MRI to rule out posterior stroke.  If MRI is negative, will plan to send home with outpatient follow-up.  He did have improvement with meclizine so I will provide him with a prescription for meclizine.   [KM]  2119 Patient does appear to have a remote right cerebellar infarct on his MRI.  Discussed this case with neurology who thought this may or may not be the actual cause of the patient's dizziness and to happen so long ago.  They discussed getting a CTA of the head and neck.  Most likely will be able to go home and follow-up outpatient with neurology and less abnormal findings on CTA.  I discussed the case with Dr. Fuller Plan.  I have passed the patient on to Dr. Fuller Plan due to change of shift.   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

## 2019-02-24 LAB — URINE CULTURE: Culture: NO GROWTH

## 2019-03-01 ENCOUNTER — Other Ambulatory Visit: Payer: Self-pay

## 2019-03-01 ENCOUNTER — Encounter: Payer: Self-pay | Admitting: Family Medicine

## 2019-03-01 ENCOUNTER — Ambulatory Visit (INDEPENDENT_AMBULATORY_CARE_PROVIDER_SITE_OTHER): Payer: Medicare HMO | Admitting: Family Medicine

## 2019-03-01 VITALS — Ht 71.0 in

## 2019-03-01 DIAGNOSIS — N183 Chronic kidney disease, stage 3 unspecified: Secondary | ICD-10-CM

## 2019-03-01 DIAGNOSIS — Z8673 Personal history of transient ischemic attack (TIA), and cerebral infarction without residual deficits: Secondary | ICD-10-CM | POA: Diagnosis not present

## 2019-03-01 DIAGNOSIS — R2689 Other abnormalities of gait and mobility: Secondary | ICD-10-CM | POA: Diagnosis not present

## 2019-03-01 DIAGNOSIS — I10 Essential (primary) hypertension: Secondary | ICD-10-CM

## 2019-03-01 DIAGNOSIS — R739 Hyperglycemia, unspecified: Secondary | ICD-10-CM

## 2019-03-01 NOTE — Progress Notes (Signed)
Patient ID: Jimmy Rice, male   DOB: 12-20-1945, 73 y.o.   MRN: [id number]  This visit type was conducted due to national recommendations for restrictions regarding the COVID-19 pandemic in an effort to limit this patient's exposure and mitigate transmission in our community.   Virtual Visit via Video Note  I connected with Jimmy Rice on 03/01/19 at  8:15 AM EDT by a video enabled telemedicine application and verified that I am speaking with the correct person using two identifiers.  Location patient: home Location provider:work or home office Persons participating in the virtual visit: patient, provider  I discussed the limitations of evaluation and management by telemedicine and the availability of in person appointments. The patient expressed understanding and agreed to proceed.   HPI: Patient states he had some recent issues with lightheadedness and feeling "off balance "he had noted that frequently gets up to go the bathroom he feels like he is leaning to one side.  He went to the ER on 02/23/2019 for further evaluation.  He had related about a month ago bending down to pick something up his off fireplace mantle and hit his head on the side of it.  He apparently had brief loss of consciousness.  He then apparently on the Sunday prior to ER visit was lightheaded went down to get something out of the trunk of his car and hit his head on the trunk again- no LOC with that episode.  He denies any recent issues with slurred speech, focal weakness, confusion, swallowing difficulties.  CT of head showed no acute abnormalities.  Lab work was significant for glucose 143, creatinine 1.7 which is near his baseline, normal CBC.  MRI of the brain showed no acute infarct.  He did have evidence for subcentimeter remote right cerebellar infarct.  Age-related cerebral atrophy consistent with moderate chronic microvascular ischemic changes.  CT angiogram of the head and neck revealed no major stenosis of  the vertebral or carotid arteries.  Patient's main complaint this time is some increased thirst, urine frequency, and fatigue.  He had hemoglobin A1c back in August 5.5%.  Not monitoring home blood sugars.  Previous LDL cholesterol 106.  He did at one point take atorvastatin.  Does not recall any problem with statins.  Chronic problems include history of hypertension, GERD, BPH, chronic kidney disease, adenocarcinoma of the prostate    ROS: See pertinent positives and negatives per HPI.  Past Medical History:  Diagnosis Date  . Arthritis    "right knee" (07/22/2016)  . BENIGN PROSTATIC HYPERTROPHY, HX OF 04/04/2009  . Blood transfusion   . Childhood asthma   . Chronic kidney disease (CKD), stage III (moderate) (HCC)    cysts on kidneys, 3rd stage kidney disease  . Complication of anesthesia    woke up "fighting"  confusion  . GERD 01/18/2009  . HYPERTENSION 01/18/2009  . HYPOGONADISM 01/18/2009    Past Surgical History:  Procedure Laterality Date  . COLONOSCOPY    . INGUINAL HERNIA REPAIR Right 1997  . NASAL SINUS SURGERY  1997  . PARTIAL KNEE ARTHROPLASTY Right 07/22/2016   Procedure: UNICOMPARTMENTAL KNEE ARTHROPLASTY;  Surgeon: Renette Butters, MD;  Location: [place];  Service: Orthopedics;  Laterality: Right;  . PROSTATE BIOPSY  X 5  . 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 TRANSURETHRAL RESECTION OF PROSTATE  12/2014    Family History  Problem Relation Age of Onset  . Stomach cancer Paternal Aunt   . Colon cancer Neg Hx   .  Esophageal cancer Neg Hx   . Rectal cancer Neg Hx     SOCIAL HX: Former smoker   Current Outpatient Medications:  .  chlorthalidone (HYGROTON) 25 MG tablet, , Disp: , Rfl:  .  losartan (COZAAR) 50 MG tablet, Take 1 tablet (50 mg total) by mouth daily., Disp: 90 tablet, Rfl: 3  EXAM:  VITALS per patient if applicable:  GENERAL: alert, oriented, appears well and in no acute distress  HEENT: atraumatic, conjunttiva clear, no  obvious abnormalities on inspection of external nose and ears  NECK: normal movements of the head and neck  LUNGS: on inspection no signs of respiratory distress, breathing rate appears normal, no obvious gross SOB, gasping or wheezing  CV: no obvious cyanosis  MS: moves all visible extremities without noticeable abnormality  PSYCH/NEURO: pleasant and cooperative, no obvious depression or anxiety, speech and thought processing grossly intact  ASSESSMENT AND PLAN:  Discussed the following assessment and plan:  #1 recent issues with balance and lightheadedness.-Not clear the symptoms are related to remote right cerebellar infarct -He has pending follow-up with neurology June 1  #2 evidence for remote right cerebellar infarct on MRI scan. -We will address issues of antiplatelet therapy with neurologist  #3 hypertension which is controlled by recent home readings -Continue losartan and chlorthalidone  #4 mild hyperlipidemia.  Most recent LDL cholesterol 106 with a goal less than 70 -Repeat fasting lipid panel tomorrow.  Will likely start statin based on results  #5 history of hyperglycemia.  Recent increase symptoms of thirst and urine frequency -Repeat future labs with A1c and basic metabolic panel tomorrow     I discussed the assessment and treatment plan with the patient. The patient was provided an opportunity to ask questions and all were answered. The patient agreed with the plan and demonstrated an understanding of the instructions.   The patient was advised to call back or seek an in-person evaluation if the symptoms worsen or if the condition fails to improve as anticipated.    Carolann Littler, MD

## 2019-03-02 ENCOUNTER — Other Ambulatory Visit (INDEPENDENT_AMBULATORY_CARE_PROVIDER_SITE_OTHER): Payer: Medicare HMO

## 2019-03-02 ENCOUNTER — Other Ambulatory Visit: Payer: Self-pay

## 2019-03-02 ENCOUNTER — Encounter: Payer: Self-pay | Admitting: Family Medicine

## 2019-03-02 DIAGNOSIS — I10 Essential (primary) hypertension: Secondary | ICD-10-CM | POA: Diagnosis not present

## 2019-03-02 DIAGNOSIS — R739 Hyperglycemia, unspecified: Secondary | ICD-10-CM | POA: Diagnosis not present

## 2019-03-02 DIAGNOSIS — Z8673 Personal history of transient ischemic attack (TIA), and cerebral infarction without residual deficits: Secondary | ICD-10-CM

## 2019-03-02 LAB — HEPATIC FUNCTION PANEL
ALT: 12 U/L (ref 0–53)
AST: 12 U/L (ref 0–37)
Albumin: 4 g/dL (ref 3.5–5.2)
Alkaline Phosphatase: 62 U/L (ref 39–117)
Bilirubin, Direct: 0.2 mg/dL (ref 0.0–0.3)
Total Bilirubin: 0.8 mg/dL (ref 0.2–1.2)
Total Protein: 7 g/dL (ref 6.0–8.3)

## 2019-03-02 LAB — BASIC METABOLIC PANEL
BUN: 24 mg/dL — ABNORMAL HIGH (ref 6–23)
CO2: 29 mEq/L (ref 19–32)
Calcium: 9.2 mg/dL (ref 8.4–10.5)
Chloride: 102 mEq/L (ref 96–112)
Creatinine, Ser: 1.69 mg/dL — ABNORMAL HIGH (ref 0.40–1.50)
GFR: 48.32 mL/min — ABNORMAL LOW (ref >60.00)
Glucose, Bld: 105 mg/dL — ABNORMAL HIGH (ref 70–99)
Potassium: 3.7 mEq/L (ref 3.5–5.1)
Sodium: 141 mEq/L (ref 135–145)

## 2019-03-02 LAB — LIPID PANEL
Cholesterol: 192 mg/dL (ref 0–200)
HDL: 46.5 mg/dL (ref >39.00)
LDL Cholesterol: 127 mg/dL — ABNORMAL HIGH (ref 0–99)
NonHDL: 145.95
Total CHOL/HDL Ratio: 4
Triglycerides: 94 mg/dL (ref 0.0–149.0)
VLDL: 18.8 mg/dL (ref 0.0–40.0)

## 2019-03-02 LAB — HEMOGLOBIN A1C: Hgb A1c MFr Bld: 5.4 % (ref 4.6–6.5)

## 2019-03-03 DIAGNOSIS — N529 Male erectile dysfunction, unspecified: Secondary | ICD-10-CM | POA: Diagnosis not present

## 2019-03-03 DIAGNOSIS — N189 Chronic kidney disease, unspecified: Secondary | ICD-10-CM | POA: Diagnosis not present

## 2019-03-03 DIAGNOSIS — H04129 Dry eye syndrome of unspecified lacrimal gland: Secondary | ICD-10-CM | POA: Diagnosis not present

## 2019-03-03 DIAGNOSIS — Z841 Family history of disorders of kidney and ureter: Secondary | ICD-10-CM | POA: Diagnosis not present

## 2019-03-03 DIAGNOSIS — M1990 Unspecified osteoarthritis, unspecified site: Secondary | ICD-10-CM | POA: Diagnosis not present

## 2019-03-03 DIAGNOSIS — I129 Hypertensive chronic kidney disease with stage 1 through stage 4 chronic kidney disease, or unspecified chronic kidney disease: Secondary | ICD-10-CM | POA: Diagnosis not present

## 2019-03-03 DIAGNOSIS — Z8546 Personal history of malignant neoplasm of prostate: Secondary | ICD-10-CM | POA: Diagnosis not present

## 2019-03-03 DIAGNOSIS — R252 Cramp and spasm: Secondary | ICD-10-CM | POA: Diagnosis not present

## 2019-03-03 DIAGNOSIS — Z803 Family history of malignant neoplasm of breast: Secondary | ICD-10-CM | POA: Diagnosis not present

## 2019-03-03 DIAGNOSIS — Z7722 Contact with and (suspected) exposure to environmental tobacco smoke (acute) (chronic): Secondary | ICD-10-CM | POA: Diagnosis not present

## 2019-03-04 ENCOUNTER — Other Ambulatory Visit: Payer: Self-pay

## 2019-03-04 ENCOUNTER — Telehealth: Payer: Self-pay

## 2019-03-04 DIAGNOSIS — E7800 Pure hypercholesterolemia, unspecified: Secondary | ICD-10-CM

## 2019-03-04 MED ORDER — ATORVASTATIN CALCIUM 20 MG PO TABS: 20.0000 mg | ORAL_TABLET | Freq: Every day | ORAL | 1 refills | Status: DC

## 2019-03-04 NOTE — Telephone Encounter (Signed)
Copied from [place] [id number]. Topic: General - Inquiry >> Mar 04, 2019  9:48 AM Richardo Priest, NT wrote: Reason for CRM: Patient is requesting a call back from [facility] to discuss the prescription for Lipitor. Call back is [phone].

## 2019-03-04 NOTE — Telephone Encounter (Signed)
Called patient and let him know that I was sending Lipitor 20 mg to [facility] and I have scheduled him for lab and ordered his repeat labs.  Mr. Knightly wants Dr. Elease Hashimoto to know that he is very appreciative of Dr. Elease Hashimoto and very thankful that he is his doctor!!

## 2019-03-07 ENCOUNTER — Telehealth: Payer: Self-pay | Admitting: Neurology

## 2019-03-07 ENCOUNTER — Other Ambulatory Visit: Payer: Self-pay

## 2019-03-07 ENCOUNTER — Encounter: Payer: Self-pay | Admitting: Neurology

## 2019-03-07 ENCOUNTER — Telehealth (INDEPENDENT_AMBULATORY_CARE_PROVIDER_SITE_OTHER): Payer: Medicare HMO | Admitting: Neurology

## 2019-03-07 DIAGNOSIS — Z8673 Personal history of transient ischemic attack (TIA), and cerebral infarction without residual deficits: Secondary | ICD-10-CM

## 2019-03-07 DIAGNOSIS — R42 Dizziness and giddiness: Secondary | ICD-10-CM

## 2019-03-07 NOTE — Telephone Encounter (Signed)
Cardiac monitor ordered.

## 2019-03-07 NOTE — Telephone Encounter (Signed)
Left message with after hour answering service on 03-07-19 @ 12:34   Caller states that he just a had a virtual visit with Posey Pronto and she wants him to get a heart monitor, but he forgot to ask what he has to do to get it   Please call

## 2019-03-07 NOTE — Progress Notes (Signed)
New Patient Virtual Visit via Video Note The purpose of this virtual visit is to provide medical care while limiting exposure to the novel coronavirus.    Consent was obtained for video visit:  Yes.   Answered questions that patient had about telehealth interaction:  Yes.   I discussed the limitations, risks, security and privacy concerns of performing an evaluation and management service by telemedicine. I also discussed with the patient that there may be a patient responsible charge related to this service. The patient expressed understanding and agreed to proceed.  Pt location: Home Physician Location: office Name of referring provider:  Sherwood Gambler, MD I connected with [name] at patients initiation/request on 03/07/2019 at 11:10 AM EDT by video enabled telemedicine application and verified that I am speaking with the correct person using two identifiers. Pt MRN:  [id number] Pt DOB:  1946-07-31 Video Participants:  [name]    History of Present Illness: Jimmy Rice is a 73 y.o. right-handed African American male with hyperlipidemia, hypertension, prostate caner, and polycystic kidney disease complicated by CKD stage 3 presenting for evaluation of lighteadendess.   Starting on 5/18-5/19, he began having brief episodes as if he could pass out, these were last few seconds and occur 2-3 times per day.  He also had associated imbalance, but was not falling and did not use any support for gait.  He went to the ER on 02/23/2019 for the symptoms.  He underwent MRI brain which did not show any acute findings, however, there was note of an old small right cerebellar stroke.  Vessel imaging of the head and neck was normal.  He was referred here for further evaluation.  About a month ago, he reports hitting his head on his mantle while reaching for something and lost consciousness for a few seconds.  He has some lightheadedness following this. A few weeks later, he hit his  head again reaching for something in his trunk, there was no loss of consciousness.  He does denies sensation of room spinning, nausea, or vomiting.  He does not have any limb weakness or numbness or tingling.  Of note, he previously had nerve conduction study performed by myself in 2019 which showed left ulnar neuropathy at the elbow. He reports improvement in left hand paresthesias and is currently asymptomatic.   Out-side paper records, electronic medical record, and images have been reviewed where available and summarized as:  CTA head and neck 02/23/2019: 1. Patent carotid and vertebral arteries. No dissection, aneurysm, or hemodynamically significant stenosis utilizing NASCET criteria. 2. Patent anterior and posterior intracranial circulation period. No large vessel occlusion, aneurysm, or significant stenosis.  MRI brain wwo contrast 02/23/2019: 1. No acute intracranial infarct or other abnormality identified. 2. Subcentimeter remote right cerebellar infarct. 3. Underlying age-related cerebral atrophy with moderate chronic microvascular ischemic disease.  Lab Results  Component Value Date   HGBA1C 5.4 03/02/2019   No results found for: VITAMINB12 Lab Results  Component Value Date   TSH 2.51 05/19/2018   No results found for: ESRSEDRATE, POCTSEDRATE  Past Medical History:  Diagnosis Date  . Arthritis    "right knee" (07/22/2016)  . BENIGN PROSTATIC HYPERTROPHY, HX OF 04/04/2009  . Blood transfusion   . Childhood asthma   . Chronic kidney disease (CKD), stage III (moderate) (HCC)    cysts on kidneys, 3rd stage kidney disease  . Complication of anesthesia    woke up "fighting"  confusion  . GERD 01/18/2009  . HYPERTENSION  01/18/2009  . HYPOGONADISM 01/18/2009    Past Surgical History:  Procedure Laterality Date  . COLONOSCOPY    . INGUINAL HERNIA REPAIR Right 1997  . NASAL SINUS SURGERY  1997  . PARTIAL KNEE ARTHROPLASTY Right 07/22/2016   Procedure: UNICOMPARTMENTAL  KNEE ARTHROPLASTY;  Surgeon: Renette Butters, MD;  Location: [place];  Service: Orthopedics;  Laterality: Right;  . PROSTATE BIOPSY  X 5  . 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 TRANSURETHRAL RESECTION OF PROSTATE  12/2014     Medications:  Outpatient Encounter Medications as of 03/07/2019  Medication Sig  . atorvastatin (LIPITOR) 20 MG tablet Take 1 tablet (20 mg total) by mouth daily.  Marland Kitchen losartan (COZAAR) 50 MG tablet Take 1 tablet (50 mg total) by mouth daily.   No facility-administered encounter medications on file as of 03/07/2019.     Allergies:  Allergies  Allergen Reactions  . Flomax [Tamsulosin Hcl] Other (See Comments)    DIFFICULTY BREATHING NASAL STUFFINESS  . Amlodipine Hives    Family History: Family History  Problem Relation Age of Onset  . Stomach cancer Paternal Aunt   . Colon cancer Neg Hx   . Esophageal cancer Neg Hx   . Rectal cancer Neg Hx     Social History: Social History   Tobacco Use  . Smoking status: Former Smoker    Types: Cigarettes  . Smokeless tobacco: Never Used  . Tobacco comment: "quit smoking in the late 1960's; smoked < 1 pack cigarettes TOTAL"  Substance Use Topics  . Alcohol use: Yes    Alcohol/week: 1.0 standard drinks    Types: 1 Cans of beer per week    Comment: none  . Drug use: No   Social History   Social History Narrative  . Not on file    Review of Systems:  CONSTITUTIONAL: No fevers, chills, night sweats, or weight loss.   EYES: No visual changes or eye pain ENT: No hearing changes.  No history of nose bleeds.   RESPIRATORY: No cough, wheezing and shortness of breath.   CARDIOVASCULAR: Negative for chest pain, and palpitations.   GI: Negative for abdominal discomfort, blood in stools or black stools.  No recent change in bowel habits.   GU:  No history of incontinence.   MUSCLOSKELETAL: No history of joint pain or swelling.  No myalgias.   SKIN: Negative for lesions, rash, and itching.    HEMATOLOGY/ONCOLOGY: Negative for prolonged bleeding, bruising easily, and swollen nodes.  No history of cancer.   ENDOCRINE: Negative for cold or heat intolerance, polydipsia or goiter.   PSYCH:  No depression or anxiety symptoms.   NEURO: As Above.   General Medical Exam:  Well appearing, comfortable.  Nonlabored breathing.    Neurological Exam: MENTAL STATUS including orientation to time, place, person, recent and remote memory, attention span and concentration, language, and fund of knowledge is normal.  Speech is not dysarthric.  CRANIAL NERVES:  Normal conjugate, extra-ocular eye movements in all directions of gaze.  No ptosis.  Mild flattening of the right nasolabial fold, symmetric smile.  Normal facial movements.  Normal shoulder shrug and head rotation.  Tongue is midline.  MOTOR:  Antigravity in all extremities.  No abnormal movements.  No pronator drift.   COORDINATION/GAIT: Normal finger to nose bilaterally.  Intact rapid alternating movements bilaterally.  Able to rise from a chair without using arms.  Gait narrow based and stable.   IMPRESSION: Episodic lightheadedness.  Unlikely to be related to  old right cerebellar stroke as he does not have classic cerebellar findings and symptoms are too brief for this to be residual from a stroke.  He describes more of presyncope, so I will check 30-day cardiac monitor for cardiac arrhythmia.  With his worsening renal disease (stage 3 - 4 CKD) and lack of findings to support his current presentation to be cerebrovascular event, I don't think he needs to be on aspirin.   Follow Up Instructions:  I discussed the assessment and treatment plan with the patient. The patient was provided an opportunity to ask questions and all were answered. The patient agreed with the plan and demonstrated an understanding of the instructions.   The patient was advised to call back or seek an in-person evaluation if the symptoms worsen or if the condition  fails to improve as anticipated.  Return to clinic in 3 months  Total Time spent:  45 min   [place], DO

## 2019-03-09 ENCOUNTER — Telehealth: Payer: Self-pay | Admitting: Radiology

## 2019-03-09 NOTE — Telephone Encounter (Signed)
Enrolled patient for a 30 Day Preventice Event Monitor to be mailed. Brief instructions were gone over and patient knows to expect the monitor to arrive in 3-4 days

## 2019-03-17 ENCOUNTER — Ambulatory Visit (INDEPENDENT_AMBULATORY_CARE_PROVIDER_SITE_OTHER): Payer: Medicare HMO

## 2019-03-17 DIAGNOSIS — Z8673 Personal history of transient ischemic attack (TIA), and cerebral infarction without residual deficits: Secondary | ICD-10-CM

## 2019-03-17 DIAGNOSIS — R42 Dizziness and giddiness: Secondary | ICD-10-CM

## 2019-03-30 DIAGNOSIS — Z20828 Contact with and (suspected) exposure to other viral communicable diseases: Secondary | ICD-10-CM | POA: Diagnosis not present

## 2019-04-25 ENCOUNTER — Other Ambulatory Visit: Payer: Self-pay | Admitting: Neurology

## 2019-04-25 DIAGNOSIS — R42 Dizziness and giddiness: Secondary | ICD-10-CM

## 2019-04-25 DIAGNOSIS — Z8673 Personal history of transient ischemic attack (TIA), and cerebral infarction without residual deficits: Secondary | ICD-10-CM

## 2019-04-26 ENCOUNTER — Telehealth: Payer: Self-pay | Admitting: *Deleted

## 2019-04-26 NOTE — Telephone Encounter (Signed)
Copied from [place] [id number]. Topic: Appointment Scheduling - Scheduling Inquiry for Clinic >> Apr 26, 2019 10:01 AM Richardo Priest, NT wrote: Reason for CRM: Attempted to contact FC line 3x, was unsuccessful. Patient in need of rescheduling appointment on 7/29. Please advise.  Patient verbalizes he is going out of town to see Nephrologist and will be gone for a couple of weeks. Patient rescheduled for 04/29/2019 for labs.

## 2019-04-29 ENCOUNTER — Other Ambulatory Visit: Payer: Self-pay

## 2019-04-29 ENCOUNTER — Other Ambulatory Visit (INDEPENDENT_AMBULATORY_CARE_PROVIDER_SITE_OTHER): Payer: Medicare HMO

## 2019-04-29 DIAGNOSIS — E7800 Pure hypercholesterolemia, unspecified: Secondary | ICD-10-CM | POA: Diagnosis not present

## 2019-04-29 LAB — HEPATIC FUNCTION PANEL
ALT: 17 U/L (ref 0–53)
AST: 16 U/L (ref 0–37)
Albumin: 4.3 g/dL (ref 3.5–5.2)
Alkaline Phosphatase: 59 U/L (ref 39–117)
Bilirubin, Direct: 0.2 mg/dL (ref 0.0–0.3)
Total Bilirubin: 0.9 mg/dL (ref 0.2–1.2)
Total Protein: 7.3 g/dL (ref 6.0–8.3)

## 2019-04-29 LAB — LIPID PANEL
Cholesterol: 139 mg/dL (ref 0–200)
HDL: 47.8 mg/dL (ref >39.00)
LDL Cholesterol: 76 mg/dL (ref 0–99)
NonHDL: 91.37
Total CHOL/HDL Ratio: 3
Triglycerides: 76 mg/dL (ref 0.0–149.0)
VLDL: 15.2 mg/dL (ref 0.0–40.0)

## 2019-05-04 ENCOUNTER — Other Ambulatory Visit: Payer: Medicare HMO

## 2019-05-09 ENCOUNTER — Other Ambulatory Visit: Payer: Self-pay

## 2019-05-09 DIAGNOSIS — I472 Ventricular tachycardia: Secondary | ICD-10-CM

## 2019-05-09 DIAGNOSIS — I4729 Other ventricular tachycardia: Secondary | ICD-10-CM

## 2019-05-11 DIAGNOSIS — E559 Vitamin D deficiency, unspecified: Secondary | ICD-10-CM | POA: Diagnosis not present

## 2019-05-11 DIAGNOSIS — E611 Iron deficiency: Secondary | ICD-10-CM | POA: Diagnosis not present

## 2019-05-11 DIAGNOSIS — R808 Other proteinuria: Secondary | ICD-10-CM | POA: Diagnosis not present

## 2019-05-11 DIAGNOSIS — N189 Chronic kidney disease, unspecified: Secondary | ICD-10-CM | POA: Diagnosis not present

## 2019-05-19 DIAGNOSIS — R3121 Asymptomatic microscopic hematuria: Secondary | ICD-10-CM | POA: Diagnosis not present

## 2019-05-19 DIAGNOSIS — R319 Hematuria, unspecified: Secondary | ICD-10-CM | POA: Diagnosis not present

## 2019-05-19 DIAGNOSIS — N401 Enlarged prostate with lower urinary tract symptoms: Secondary | ICD-10-CM | POA: Diagnosis not present

## 2019-05-19 DIAGNOSIS — R109 Unspecified abdominal pain: Secondary | ICD-10-CM | POA: Diagnosis not present

## 2019-05-19 DIAGNOSIS — R3129 Other microscopic hematuria: Secondary | ICD-10-CM | POA: Diagnosis not present

## 2019-05-19 DIAGNOSIS — N3943 Post-void dribbling: Secondary | ICD-10-CM | POA: Diagnosis not present

## 2019-05-19 DIAGNOSIS — N138 Other obstructive and reflux uropathy: Secondary | ICD-10-CM | POA: Diagnosis not present

## 2019-05-19 DIAGNOSIS — N183 Chronic kidney disease, stage 3 (moderate): Secondary | ICD-10-CM | POA: Diagnosis not present

## 2019-05-19 DIAGNOSIS — C61 Malignant neoplasm of prostate: Secondary | ICD-10-CM | POA: Diagnosis not present

## 2019-05-19 DIAGNOSIS — I129 Hypertensive chronic kidney disease with stage 1 through stage 4 chronic kidney disease, or unspecified chronic kidney disease: Secondary | ICD-10-CM | POA: Diagnosis not present

## 2019-05-20 DIAGNOSIS — N281 Cyst of kidney, acquired: Secondary | ICD-10-CM | POA: Diagnosis not present

## 2019-05-20 DIAGNOSIS — N400 Enlarged prostate without lower urinary tract symptoms: Secondary | ICD-10-CM | POA: Diagnosis not present

## 2019-05-20 DIAGNOSIS — R109 Unspecified abdominal pain: Secondary | ICD-10-CM | POA: Diagnosis not present

## 2019-05-24 ENCOUNTER — Ambulatory Visit: Payer: Medicare HMO

## 2019-06-02 ENCOUNTER — Telehealth: Payer: Self-pay | Admitting: Family Medicine

## 2019-06-02 NOTE — Telephone Encounter (Signed)
Patient requesting colonoscopy orders, please advise when orders are placed.

## 2019-06-03 NOTE — Telephone Encounter (Signed)
Last colonoscopy 2013 said 10-year follow-up.  Is he having any new symptoms?.  If not, needs follow-up in 3 years

## 2019-06-03 NOTE — Telephone Encounter (Signed)
Called patient and gave him message from Dr. Elease Hashimoto. Patient stated that he is not having any symptoms and verbalized an understanding.

## 2019-06-03 NOTE — Telephone Encounter (Signed)
Please see message. Do you need a Doxy to review for this request?

## 2019-06-07 DIAGNOSIS — R69 Illness, unspecified: Secondary | ICD-10-CM | POA: Diagnosis not present

## 2019-06-08 ENCOUNTER — Ambulatory Visit: Payer: Medicare HMO | Admitting: Neurology

## 2019-06-16 DIAGNOSIS — R0602 Shortness of breath: Secondary | ICD-10-CM | POA: Diagnosis not present

## 2019-06-16 DIAGNOSIS — I472 Ventricular tachycardia: Secondary | ICD-10-CM | POA: Diagnosis not present

## 2019-06-24 DIAGNOSIS — I472 Ventricular tachycardia: Secondary | ICD-10-CM | POA: Diagnosis not present

## 2019-06-24 DIAGNOSIS — R0602 Shortness of breath: Secondary | ICD-10-CM | POA: Diagnosis not present

## 2019-06-28 ENCOUNTER — Ambulatory Visit: Payer: Medicare HMO

## 2019-07-02 DIAGNOSIS — R002 Palpitations: Secondary | ICD-10-CM | POA: Diagnosis not present

## 2019-07-05 ENCOUNTER — Other Ambulatory Visit: Payer: Self-pay | Admitting: Family Medicine

## 2019-07-05 DIAGNOSIS — R002 Palpitations: Secondary | ICD-10-CM | POA: Diagnosis not present

## 2019-07-08 ENCOUNTER — Encounter: Payer: Self-pay | Admitting: Family Medicine

## 2019-07-08 ENCOUNTER — Other Ambulatory Visit: Payer: Self-pay

## 2019-07-08 ENCOUNTER — Ambulatory Visit (INDEPENDENT_AMBULATORY_CARE_PROVIDER_SITE_OTHER): Payer: Medicare HMO | Admitting: Family Medicine

## 2019-07-08 VITALS — BP 130/80 | HR 76 | Temp 97.8°F | Wt 202.2 lb

## 2019-07-08 DIAGNOSIS — I10 Essential (primary) hypertension: Secondary | ICD-10-CM | POA: Diagnosis not present

## 2019-07-08 DIAGNOSIS — I493 Ventricular premature depolarization: Secondary | ICD-10-CM | POA: Diagnosis not present

## 2019-07-08 DIAGNOSIS — N183 Chronic kidney disease, stage 3 unspecified: Secondary | ICD-10-CM | POA: Diagnosis not present

## 2019-07-08 NOTE — Progress Notes (Signed)
Subjective:     Patient ID: Jimmy Rice, male   DOB: [birth year], 73 y.o.   MRN: [id number]  HPI Patient is here basically to discuss our opinion regarding recent prescription of beta-blocker by cardiologist up in [place].  He had episode of syncope and had complained of some lightheadedness.  Was seen by neurologist here.  Was referred for event monitoring which showed PVCs and one 7 beat run of V. tach.  He has scheduled follow-up with cardiologist October 8 in [place].  In the meantime, he went to a cardiologist up in [place] and reportedly had another event monitor which showed "arrhythmia ".  Patient does not have results and cannot be more specific.  He was prescribed Toprol-XL 25 mg but has not yet started.  He denies any recent syncope.  No dizziness.  No chest pain.  No dyspnea.  Not describing any consistent orthostatic symptoms.  Does drink occasional beer but no binging.  Minimal caffeine use.  Past Medical History:  Diagnosis Date  . Arthritis    "right knee" (07/22/2016)  . BENIGN PROSTATIC HYPERTROPHY, HX OF 04/04/2009  . Blood transfusion   . Childhood asthma   . Chronic kidney disease (CKD), stage III (moderate)    cysts on kidneys, 3rd stage kidney disease  . Complication of anesthesia    woke up "fighting"  confusion  . GERD 01/18/2009  . HYPERTENSION 01/18/2009  . HYPOGONADISM 01/18/2009   Past Surgical History:  Procedure Laterality Date  . COLONOSCOPY    . INGUINAL HERNIA REPAIR Right 1997  . NASAL SINUS SURGERY  1997  . PARTIAL KNEE ARTHROPLASTY Right 07/22/2016   Procedure: UNICOMPARTMENTAL KNEE ARTHROPLASTY;  Surgeon: Renette Butters, MD;  Location: [place];  Service: Orthopedics;  Laterality: Right;  . PROSTATE BIOPSY  X 5  . 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 TRANSURETHRAL RESECTION OF PROSTATE  12/2014    reports that he has quit smoking. His smoking use included cigarettes. He has never used smokeless tobacco. He reports  current alcohol use of about 1.0 standard drinks of alcohol per week. He reports that he does not use drugs. family history includes Bladder Cancer in his brother; Breast cancer in his mother and sister; Kidney disease in his father; Stomach cancer in his paternal aunt. Allergies  Allergen Reactions  . Flomax [Tamsulosin Hcl] Other (See Comments)    DIFFICULTY BREATHING NASAL STUFFINESS  . Amlodipine Hives     Review of Systems  Constitutional: Negative for chills, fatigue, fever and unexpected weight change.  Eyes: Negative for visual disturbance.  Respiratory: Negative for cough, chest tightness and shortness of breath.   Cardiovascular: Negative for chest pain and leg swelling.  Neurological: Negative for dizziness, seizures, syncope, weakness, light-headedness and headaches.       Objective:   Physical Exam Constitutional:      Appearance: He is well-developed.  HENT:     Right Ear: External ear normal.     Left Ear: External ear normal.  Eyes:     Pupils: Pupils are equal, round, and reactive to light.  Neck:     Musculoskeletal: Neck supple.     Thyroid: No thyromegaly.  Cardiovascular:     Rate and Rhythm: Normal rate and regular rhythm.  Pulmonary:     Effort: Pulmonary effort is normal. No respiratory distress.     Breath sounds: Normal breath sounds. No wheezing or rales.  Musculoskeletal:     Right lower leg: No edema.  Left lower leg: No edema.  Neurological:     General: No focal deficit present.     Mental Status: He is alert and oriented to person, place, and time.        Assessment:     #1 history of PVCs and reported 7 beat run of V. tach.  He has had some past dizziness but none recently.  #2 hypertension which is stable and at goal  #3 history of stage III chronic kidney disease    Plan:     -He is advised to keep follow-up with cardiology as scheduled -We answered questions he had regarding beta-blocker.  We explained that be difficult  for [place] to answer questions regarding his evaluation in [place] as we have no records at this time. -He does have reported history of asthma and we explained caution with beta-blocker use -Stressed the importance of minimal caffeine and alcohol use with PVC history  Eulas Post MD [facility]

## 2019-07-08 NOTE — Patient Instructions (Signed)
Palpitations Palpitations are feelings that your heartbeat is irregular or is faster than normal. It may feel like your heart is fluttering or skipping a beat. Palpitations are usually not a serious problem. They may be caused by many things, including smoking, caffeine, alcohol, stress, and certain medicines or drugs. Most causes of palpitations are not serious. However, some palpitations can be a sign of a serious problem. You may need further tests to rule out serious medical problems. Follow these instructions at home:     Pay attention to any changes in your condition. Take these actions to help manage your symptoms: Eating and drinking  Avoid foods and drinks that may cause palpitations. These may include: ? Caffeinated coffee, tea, soft drinks, diet pills, and energy drinks. ? Chocolate. ? Alcohol. Lifestyle  Take steps to reduce your stress and anxiety. Things that can help you relax include: ? Yoga. ? Mind-body activities, such as deep breathing, meditation, or using words and images to create positive thoughts (guided imagery). ? Physical activity, such as swimming, jogging, or walking. Tell your health care provider if your palpitations increase with activity. If you have chest pain or shortness of breath with activity, do not continue the activity until you are seen by your health care provider. ? Biofeedback. This is a method that helps you learn to use your mind to control things in your body, such as your heartbeat.  Do not use drugs, including cocaine or ecstasy. Do not use marijuana.  Get plenty of rest and sleep. Keep a regular bed time. General instructions  Take over-the-counter and prescription medicines only as told by your health care provider.  Do not use any products that contain nicotine or tobacco, such as cigarettes and e-cigarettes. If you need help quitting, ask your health care provider.  Keep all follow-up visits as told by your health care provider. This  is important. These may include visits for further testing if palpitations do not go away or get worse. Contact a health care provider if you:  Continue to have a fast or irregular heartbeat after 24 hours.  Notice that your palpitations occur more often. Get help right away if you:  Have chest pain or shortness of breath.  Have a severe headache.  Feel dizzy or you faint. Summary  Palpitations are feelings that your heartbeat is irregular or is faster than normal. It may feel like your heart is fluttering or skipping a beat.  Palpitations may be caused by many things, including smoking, caffeine, alcohol, stress, certain medicines, and drugs.  Although most causes of palpitations are not serious, some causes can be a sign of a serious medical problem.  Get help right away if you faint or have chest pain, shortness of breath, a severe headache, or dizziness. This information is not intended to replace advice given to you by your health care provider. Make sure you discuss any questions you have with your health care provider. Document Released: 09/19/2000 Document Revised: 11/04/2017 Document Reviewed: 11/04/2017 Elsevier Patient Education  2020 [employer].

## 2019-07-14 ENCOUNTER — Other Ambulatory Visit: Payer: Self-pay

## 2019-07-14 ENCOUNTER — Ambulatory Visit (INDEPENDENT_AMBULATORY_CARE_PROVIDER_SITE_OTHER): Payer: Medicare HMO | Admitting: Cardiovascular Disease

## 2019-07-14 VITALS — BP 130/82 | HR 62 | Ht 72.0 in | Wt 208.5 lb

## 2019-07-14 DIAGNOSIS — I10 Essential (primary) hypertension: Secondary | ICD-10-CM

## 2019-07-14 DIAGNOSIS — Z20822 Contact with and (suspected) exposure to covid-19: Secondary | ICD-10-CM

## 2019-07-14 DIAGNOSIS — I493 Ventricular premature depolarization: Secondary | ICD-10-CM

## 2019-07-14 DIAGNOSIS — Z20828 Contact with and (suspected) exposure to other viral communicable diseases: Secondary | ICD-10-CM | POA: Diagnosis not present

## 2019-07-14 DIAGNOSIS — E785 Hyperlipidemia, unspecified: Secondary | ICD-10-CM | POA: Diagnosis not present

## 2019-07-14 NOTE — Progress Notes (Signed)
Cardiology Office Note   Date:  07/14/2019   ID:  Jimmy Rice, DOB 07/16/1946, MRN [id number]  PCP:  Jimmy Post, MD  Cardiologist:   Kathlyn Sacramento, MD   Chief Complaint  Patient presents with   New Patient (Initial Visit)    referred by PCP for fast HR. Patient c./o some SOB and tingling in toes. Meds reviewed verbally with patient.       History of Present Illness: Jimmy Rice is a 73 y.o. male who was referred for evaluation of palpitations and syncope. 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 In May, he started having episodes of palpitations and orthostatic dizziness.  He had one incident where he hit his head hard on the fireplace mantle and then he found himself on the floor after an apparent loss of consciousness. He was evaluated by neurology in June.  He had MRI of the brain which showed remote right cerebellar stroke which was not felt to be the cause of his symptoms.  CTA of the neck and brain did not show evidence of a vascular etiology. The patient was seen by a cardiologist in [place] and had extensive work-up including an echo, stress test and a monitor.  He was told about PVCs and was started on Toprol.  Since then, he reports significant improvement in palpitations and dizziness.  No chest pain or shortness of breath.  He is not a smoker and there is no family history of coronary artery disease, arrhythmia or heart failure.     Past Medical History:  Diagnosis Date   Arthritis    "right knee" (07/22/2016)   BENIGN PROSTATIC HYPERTROPHY, HX OF 04/04/2009   Blood transfusion    Childhood asthma    Chronic kidney disease (CKD), stage III (moderate)    cysts on kidneys, 3rd stage kidney disease   Complication of anesthesia    woke up "fighting"  confusion   GERD 01/18/2009   HYPERTENSION 01/18/2009   HYPOGONADISM 01/18/2009     Past Surgical History:  Procedure Laterality Date   COLONOSCOPY     INGUINAL HERNIA REPAIR Right 1997   NASAL SINUS SURGERY  1997   PARTIAL KNEE ARTHROPLASTY Right 07/22/2016   Procedure: UNICOMPARTMENTAL KNEE ARTHROPLASTY;  Surgeon: Renette Butters, MD;  Location: [facility];  Service: Orthopedics;  Laterality: Right;   PROSTATE BIOPSY  X 5   REPLACEMENT UNICONDYLAR JOINT KNEE Right 07/22/2016   TRANSURETHRAL RESECTION OF PROSTATE  12/2014     Current Outpatient Medications  Medication Sig Dispense Refill   atorvastatin (LIPITOR) 20 MG tablet TAKE 1 TABLET BY MOUTH EVERY DAY 90 tablet 1   chlorthalidone (HYGROTON) 25 MG tablet Take 25 mg by mouth daily.     losartan-hydrochlorothiazide (HYZAAR) 100-12.5 MG tablet      metoprolol succinate (TOPROL-XL) 25 MG 24 hr tablet Take 25 mg by mouth daily.     No current facility-administered medications for this visit.     Allergies:   Flomax [tamsulosin hcl] and Amlodipine    Social History:  The patient  reports that he has quit smoking. His smoking use included cigarettes. He has never used smokeless tobacco. He reports current alcohol use of about 1.0 standard drinks of alcohol per week. He reports that he does not use drugs.   Family History:  The patient's family history includes Bladder Cancer in his brother; Breast cancer in  his mother and sister; Kidney disease in his father; Stomach cancer in his paternal aunt.    ROS:  Please see the history of present illness.   Otherwise, review of systems are positive for none.   All other systems are reviewed and negative.    PHYSICAL EXAM: VS:  BP 130/82 (BP Location: Right Arm, Patient Position: Sitting, Cuff Size: Normal)    Pulse 62    Ht 6' (1.829 m)    Wt 208 lb 8 oz (94.6 kg)    BMI 28.28 kg/m  , BMI Body mass index is 28.28 kg/m. GEN: Well nourished, well developed, in no acute distress  HEENT: normal  Neck: no JVD, carotid bruits, or masses Cardiac: RRR; no  murmurs, rubs, or gallops,no edema  Respiratory:  clear to auscultation bilaterally, normal work of breathing GI: soft, nontender, nondistended, + BS MS: no deformity or atrophy  Skin: warm and dry, no rash Neuro:  Strength and sensation are intact Psych: euthymic mood, full affect   EKG:  EKG is ordered today. The ekg ordered today demonstrates normal sinus rhythm with a PVC, LVH.   Recent Labs: 02/23/2019: Hemoglobin 13.9; Platelets 173 03/02/2019: BUN 24; Creatinine, Ser 1.69; Potassium 3.7; Sodium 141 04/29/2019: ALT 17    Lipid Panel    Component Value Date/Time   CHOL 139 04/29/2019 1018   TRIG 76.0 04/29/2019 1018   HDL 47.80 04/29/2019 1018   CHOLHDL 3 04/29/2019 1018   VLDL 15.2 04/29/2019 1018   LDLCALC 76 04/29/2019 1018   LDLDIRECT 106.0 05/19/2018 1510      Wt Readings from Last 3 Encounters:  07/14/19 208 lb 8 oz (94.6 kg)  07/08/19 202 lb 3.2 oz (91.7 kg)  02/23/19 194 lb (88 kg)     PAD Screen 07/14/2019  Previous PAD dx? No  Previous surgical procedure? No  Pain with walking? No  Feet/toe relief with dangling? No  Painful, non-healing ulcers? No  Extremities discolored? No      ASSESSMENT AND PLAN:  1.  Symptomatic PVCs: The patient underwent work-up in [place] recently and thus I am going to request these records so we do not repeat any of the testing.  It appears that he responded very well to small dose Toprol and he reports significant improvement in symptoms. I do not think the episode of loss of consciousness was related to an arrhythmia issue given that it happened shortly after a significant head trauma.  Most likely that was related to concussion.  2.  Essential hypertension: Blood pressure is reasonably controlled on current medications.  3.  Hyperlipidemia: Currently on atorvastatin 20 mg daily.    Disposition:   FU with me in 6 months  Signed,  Kathlyn Sacramento, MD  07/14/2019 8:47 AM    [name]

## 2019-07-14 NOTE — Patient Instructions (Signed)
Medication Instructions:  Your physician recommends that you continue on your current medications as directed. Please refer to the Current Medication list given to you today.  If you need a refill on your cardiac medications before your next appointment, please call your pharmacy.   Lab work: None ordered If you have labs (blood work) drawn today and your tests are completely normal, you will receive your results only by: Marland Kitchen MyChart Message (if you have MyChart) OR . A paper copy in the mail If you have any lab test that is abnormal or we need to change your treatment, we will call you to review the results.  Testing/Procedures: None ordered  Follow-Up: At [facility], you and your health needs are our priority.  As part of our continuing mission to provide you with exceptional heart care, we have created designated Provider Care Teams.  These Care Teams include your primary Cardiologist (physician) and Advanced Practice Providers (APPs -  Physician Assistants and Nurse Practitioners) who all work together to provide you with the care you need, when you need it. You will need a follow up appointment in 6 months.  Please call our office 2 months in advance to schedule this appointment.  You may see  Dr. Fletcher Anon or one of the following Advanced Practice Providers on your designated Care Team:   Murray Hodgkins, NP Christell Faith, PA-C . Marrianne Mood, PA-C  Any Other Special Instructions Will Be Listed Below (If Applicable). We will request your cardiology records from your previous Cardiologist in [place].

## 2019-07-15 LAB — NOVEL CORONAVIRUS, NAA: SARS-CoV-2, NAA: NOT DETECTED

## 2019-08-01 ENCOUNTER — Ambulatory Visit: Payer: Medicare HMO | Admitting: Neurology

## 2019-08-01 NOTE — Progress Notes (Deleted)
Follow-up Visit   Date: 08/01/19   Jimmy Rice MRN: [id number] DOB: [birth year]   Interim History: Jimmy Rice is a 73 y.o. right-handed African American male with hyperlipidemia, hypertension, prostate cancer, and polycystic kidney disease (CKD stage 3) returning to the clinic for follow-up of lightheadedness.  The patient was accompanied to the clinic by *** who also provides collateral information.    History of present illness: Starting on 5/18-5/19, he began having brief episodes as if he could pass out, these were last few seconds and occur 2-3 times per day.  He also had associated imbalance, but was not falling and did not use any support for gait.  He went to the ER on 02/23/2019 for the symptoms.  He underwent MRI brain which did not show any acute findings, however, there was note of an old small right cerebellar stroke.  Vessel imaging of the head and neck was normal.  He was referred here for further evaluation.  In June, he reports hitting his head on his mantle while reaching for something and lost consciousness for a few seconds.  He has some lightheadedness following this. A few weeks later, he hit his head again reaching for something in his trunk, there was no loss of consciousness.  He does denies sensation of room spinning, nausea, or vomiting.  He does not have any limb weakness or numbness or tingling.  Of note, he previously had nerve conduction study performed by myself in 2019 which showed left ulnar neuropathy at the elbow. He reports improvement in left hand paresthesias and is currently asymptomatic.  UPDATE ***:  Medications:  Current Outpatient Medications on File Prior to Visit  Medication Sig Dispense Refill  . atorvastatin (LIPITOR) 20 MG tablet TAKE 1 TABLET BY MOUTH EVERY DAY 90 tablet 1  . chlorthalidone (HYGROTON) 25 MG tablet Take 25 mg by mouth daily.    Marland Kitchen losartan-hydrochlorothiazide (HYZAAR) 100-12.5 MG tablet     . metoprolol  succinate (TOPROL-XL) 25 MG 24 hr tablet Take 25 mg by mouth daily.     No current facility-administered medications on file prior to visit.     Allergies:  Allergies  Allergen Reactions  . Flomax [Tamsulosin Hcl] Other (See Comments)    DIFFICULTY BREATHING NASAL STUFFINESS  . Amlodipine Hives    Review of Systems:  CONSTITUTIONAL: No fevers, chills, night sweats, or weight loss.  EYES: No visual changes or eye pain ENT: No hearing changes.  No history of nose bleeds.   RESPIRATORY: No cough, wheezing and shortness of breath.   CARDIOVASCULAR: Negative for chest pain, and palpitations.   GI: Negative for abdominal discomfort, blood in stools or black stools.  No recent change in bowel habits.   GU:  No history of incontinence.   MUSCLOSKELETAL: No history of joint pain or swelling.  No myalgias.   SKIN: Negative for lesions, rash, and itching.   ENDOCRINE: Negative for cold or heat intolerance, polydipsia or goiter.   PSYCH:  *** depression or anxiety symptoms.   NEURO: As Above.   Vital Signs:  There were no vitals taken for this visit.   General Medical Exam:   General:  Well appearing, comfortable  Eyes/ENT: see cranial nerve examination.   Neck:  No carotid bruits. Respiratory:  Clear to auscultation, good air entry bilaterally.   Cardiac:  Regular rate and rhythm, no murmur.   Ext:  No edema ***  Neurological Exam: MENTAL STATUS including orientation to time, place, person, recent  and remote memory, attention span and concentration, language, and fund of knowledge is ***normal.  Speech is not dysarthric.  CRANIAL NERVES:  No visual field defects.  Pupils equal round and reactive to light.  Normal conjugate, extra-ocular eye movements in all directions of gaze.  No ptosis ***.  Face is symmetric. Palate elevates symmetrically.  Tongue is midline.  MOTOR:  Motor strength is 5/5 in all extremities, ***.  No atrophy, fasciculations or abnormal movements.  No pronator  drift.  Tone is normal.    MSRs:  Reflexes are 2+/4 throughout ***.  SENSORY:  Intact to vibration throughout ***.  COORDINATION/GAIT:  Normal finger-to- nose-finger.  Intact rapid alternating movements bilaterally.  Gait narrow based and stable.   Data:*** CTA head and neck 02/23/2019: 1. Patent carotid and vertebral arteries. No dissection, aneurysm, or hemodynamically significant stenosis utilizing NASCET criteria. 2. Patent anterior and posterior intracranial circulation period. No large vessel occlusion, aneurysm, or significant stenosis.  MRI brain wwo contrast 02/23/2019: 1. No acute intracranial infarct or other abnormality identified. 2. Subcentimeter remote right cerebellar infarct. 3. Underlying age-related cerebral atrophy with moderate chronic microvascular ischemic disease.  Cardiac event monitor 03/17/2019: Study Highlights   Normal sinus rhythm  PVCs are noted.  1 instance of 7 beat nonsustained VT  No correlation between ventricular ectopy and symptoms.  No atrial fibrillation or excessive bradycardia.      IMPRESSION/PLAN: *** Episodic lightheadedness due to symptomatic PVCs, followed by cardiology ***  PLAN/RECOMMENDATIONS:  ***  Return to clinic in ***.    Thank you for allowing me to participate in patient's care.  If I can answer any additional questions, I would be pleased to do so.    Sincerely,    Donika K. Posey Pronto, DO

## 2019-08-19 ENCOUNTER — Other Ambulatory Visit: Payer: Self-pay | Admitting: *Deleted

## 2019-08-19 DIAGNOSIS — Z20822 Contact with and (suspected) exposure to covid-19: Secondary | ICD-10-CM

## 2019-08-21 LAB — NOVEL CORONAVIRUS, NAA: SARS-CoV-2, NAA: NOT DETECTED

## 2019-08-22 ENCOUNTER — Telehealth: Payer: Self-pay | Admitting: Family Medicine

## 2019-08-22 NOTE — Telephone Encounter (Signed)
Patient is calling back to receive his negative COVID 19 test results. Patient expressed understanding.

## 2019-08-26 DIAGNOSIS — I129 Hypertensive chronic kidney disease with stage 1 through stage 4 chronic kidney disease, or unspecified chronic kidney disease: Secondary | ICD-10-CM | POA: Diagnosis not present

## 2019-08-26 DIAGNOSIS — N281 Cyst of kidney, acquired: Secondary | ICD-10-CM | POA: Diagnosis not present

## 2019-08-26 DIAGNOSIS — R809 Proteinuria, unspecified: Secondary | ICD-10-CM | POA: Diagnosis not present

## 2019-08-26 DIAGNOSIS — N1832 Chronic kidney disease, stage 3b: Secondary | ICD-10-CM | POA: Diagnosis not present

## 2019-08-29 ENCOUNTER — Ambulatory Visit: Payer: Medicare HMO | Admitting: Neurology

## 2019-09-12 ENCOUNTER — Telehealth: Payer: Medicare HMO | Admitting: Neurology

## 2019-09-12 ENCOUNTER — Other Ambulatory Visit: Payer: Self-pay

## 2019-09-15 ENCOUNTER — Encounter: Payer: Self-pay | Admitting: Neurology

## 2019-09-16 ENCOUNTER — Other Ambulatory Visit: Payer: Self-pay

## 2019-09-16 ENCOUNTER — Telehealth (INDEPENDENT_AMBULATORY_CARE_PROVIDER_SITE_OTHER): Payer: Medicare HMO | Admitting: Neurology

## 2019-09-16 VITALS — Ht 72.0 in | Wt 200.0 lb

## 2019-09-16 DIAGNOSIS — R42 Dizziness and giddiness: Secondary | ICD-10-CM

## 2019-09-16 NOTE — Progress Notes (Signed)
    Virtual Visit via Telephone Note The purpose of this virtual visit is to provide medical care while limiting exposure to the novel coronavirus.    Consent was obtained for phone visit:  Yes.   Answered questions that patient had about telehealth interaction:  Yes.   I discussed the limitations, risks, security and privacy concerns of performing an evaluation and management service by telephone. I also discussed with the patient that there may be a patient responsible charge related to this service. The patient expressed understanding and agreed to proceed.  Pt location: Home Physician Location: office Name of referring provider:  Eulas Post, MD I connected with .Jimmy Rice at patients initiation/request on 09/16/2019 at 11:30 AM EST by telephone and verified that I am speaking with the correct person using two identifiers.  Pt MRN:  [id number] Pt DOB:  [birth year]   History of Present Illness: This 73 year old man returning for follow-up of episodic lightheadedness.  As part of his evaluation, patient had 30-day cardiac monitor which showed brief episode of nonsustained ventricular arrhythmia.  He was then evaluated by cardiology in [place] as well as here locally.  He was found to have symptomatic PVCs and was started on metoprolol 25mg  which has been very effective in resolving his spells of lightheadedness.  He is very grateful for my assistance in coordinating his care and making a referral to cardiology. He will continue to follow-up with cardiology and is welcome to come back and see me for any new neurological issues.   Follow Up Instructions:   I discussed the assessment and treatment plan with the patient. The patient was provided an opportunity to ask questions and all were answered. The patient agreed with the plan and demonstrated an understanding of the instructions.   The patient was advised to call back or seek an in-person evaluation if the symptoms worsen  or if the condition fails to improve as anticipated.    Total Time spent in visit with the patient was:  3 min, of which 100% of the time was spent in counseling and/or coordinating care.   Pt understands and agrees with the plan of care outlined.     Alda Berthold, DO

## 2019-09-26 DIAGNOSIS — N281 Cyst of kidney, acquired: Secondary | ICD-10-CM | POA: Diagnosis not present

## 2019-09-26 DIAGNOSIS — R109 Unspecified abdominal pain: Secondary | ICD-10-CM | POA: Diagnosis not present

## 2019-09-27 DIAGNOSIS — H2513 Age-related nuclear cataract, bilateral: Secondary | ICD-10-CM | POA: Diagnosis not present

## 2019-09-27 DIAGNOSIS — H47233 Glaucomatous optic atrophy, bilateral: Secondary | ICD-10-CM | POA: Diagnosis not present

## 2019-09-27 DIAGNOSIS — H40013 Open angle with borderline findings, low risk, bilateral: Secondary | ICD-10-CM | POA: Diagnosis not present

## 2019-09-27 DIAGNOSIS — H04123 Dry eye syndrome of bilateral lacrimal glands: Secondary | ICD-10-CM | POA: Diagnosis not present

## 2019-10-05 ENCOUNTER — Encounter: Payer: Self-pay | Admitting: Family Medicine

## 2019-10-05 DIAGNOSIS — H35033 Hypertensive retinopathy, bilateral: Secondary | ICD-10-CM | POA: Diagnosis not present

## 2019-10-05 DIAGNOSIS — H43813 Vitreous degeneration, bilateral: Secondary | ICD-10-CM | POA: Diagnosis not present

## 2019-10-12 DIAGNOSIS — N281 Cyst of kidney, acquired: Secondary | ICD-10-CM | POA: Diagnosis not present

## 2019-10-12 DIAGNOSIS — Z01818 Encounter for other preprocedural examination: Secondary | ICD-10-CM | POA: Diagnosis not present

## 2019-10-13 ENCOUNTER — Telehealth: Payer: Self-pay | Admitting: *Deleted

## 2019-10-13 NOTE — Telephone Encounter (Signed)
Copied from [place] [id number]. Topic: General - Inquiry >> Oct 13, 2019  9:24 AM Jimmy Rice, NT wrote: Reason for CRM: Patient called in and states he will be having kidney surgery on 1/21 in [place]. Please advise.

## 2019-10-14 NOTE — Telephone Encounter (Signed)
Called patient and he just wanted to make sure Dr. Elease Hashimoto knew about his surgery. Patient stated he will let us know if he needs anything else. Sending as Juluis Rainier

## 2019-10-22 DIAGNOSIS — Z20822 Contact with and (suspected) exposure to covid-19: Secondary | ICD-10-CM | POA: Diagnosis not present

## 2019-10-24 DIAGNOSIS — N281 Cyst of kidney, acquired: Secondary | ICD-10-CM | POA: Diagnosis not present

## 2019-10-27 DIAGNOSIS — K219 Gastro-esophageal reflux disease without esophagitis: Secondary | ICD-10-CM | POA: Diagnosis not present

## 2019-10-27 DIAGNOSIS — N189 Chronic kidney disease, unspecified: Secondary | ICD-10-CM | POA: Diagnosis not present

## 2019-10-27 DIAGNOSIS — N281 Cyst of kidney, acquired: Secondary | ICD-10-CM | POA: Diagnosis not present

## 2019-10-27 DIAGNOSIS — E785 Hyperlipidemia, unspecified: Secondary | ICD-10-CM | POA: Diagnosis not present

## 2019-10-27 DIAGNOSIS — I499 Cardiac arrhythmia, unspecified: Secondary | ICD-10-CM | POA: Diagnosis not present

## 2019-10-27 DIAGNOSIS — I129 Hypertensive chronic kidney disease with stage 1 through stage 4 chronic kidney disease, or unspecified chronic kidney disease: Secondary | ICD-10-CM | POA: Diagnosis not present

## 2019-10-27 DIAGNOSIS — K660 Peritoneal adhesions (postprocedural) (postinfection): Secondary | ICD-10-CM | POA: Diagnosis not present

## 2019-11-08 ENCOUNTER — Other Ambulatory Visit: Payer: Self-pay

## 2019-11-09 ENCOUNTER — Ambulatory Visit (INDEPENDENT_AMBULATORY_CARE_PROVIDER_SITE_OTHER): Payer: Medicare HMO | Admitting: Family Medicine

## 2019-11-09 ENCOUNTER — Encounter: Payer: Self-pay | Admitting: Family Medicine

## 2019-11-09 VITALS — BP 120/80 | HR 80 | Temp 97.2°F | Wt 205.7 lb

## 2019-11-09 DIAGNOSIS — N281 Cyst of kidney, acquired: Secondary | ICD-10-CM | POA: Diagnosis not present

## 2019-11-09 DIAGNOSIS — I10 Essential (primary) hypertension: Secondary | ICD-10-CM

## 2019-11-09 NOTE — Progress Notes (Signed)
  Subjective:     Patient ID: Jimmy Rice, male   DOB: 09/21/1946, 74 y.o.   MRN: [id number]  HPI Jimmy Rice is here basically to bring [place] up to speed after recent right renal surgery.  He had been having some ongoing right flank pain.  He underwent right kidney decortication procedure January 21 up in [place].  His surgery was uneventful.  He has had good improvement with resolution of his pain since the surgery.  This was a laparoscopic procedure.  No complications.  He was inquiring about colonoscopy.  His last was 2013 with recommended 10-year follow-up.  Hypertension treated with Hyzaar and Metoprolol.   BP stable.    He is in process of trying to get Covid vaccine and asking for resources to help with that  Past Medical History:  Diagnosis Date  . Arthritis    "right knee" (07/22/2016)  . BENIGN PROSTATIC HYPERTROPHY, HX OF 04/04/2009  . Blood transfusion   . Childhood asthma   . Chronic kidney disease (CKD), stage III (moderate)    cysts on kidneys, 3rd stage kidney disease  . Complication of anesthesia    woke up "fighting"  confusion  . GERD 01/18/2009  . HYPERTENSION 01/18/2009  . HYPOGONADISM 01/18/2009   Past Surgical History:  Procedure Laterality Date  . COLONOSCOPY    . INGUINAL HERNIA REPAIR Right 1997  . NASAL SINUS SURGERY  1997  . PARTIAL KNEE ARTHROPLASTY Right 07/22/2016   Procedure: UNICOMPARTMENTAL KNEE ARTHROPLASTY;  Surgeon: Renette Butters, MD;  Location: [facility];  Service: Orthopedics;  Laterality: Right;  . PROSTATE BIOPSY  X 5  . 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 TRANSURETHRAL RESECTION OF PROSTATE  12/2014    reports that he has quit smoking. His smoking use included cigarettes. He has never used smokeless tobacco. He reports previous alcohol use of about 1.0 standard drinks of alcohol per week. He reports that he does not use drugs. family history includes Bladder Cancer in his brother; Breast cancer in his mother and  sister; Kidney disease in his father; Stomach cancer in his paternal aunt. Allergies  Allergen Reactions  . Flomax [Tamsulosin Hcl] Other (See Comments)    DIFFICULTY BREATHING NASAL STUFFINESS  . Amlodipine Hives     Review of Systems  Constitutional: Negative for chills and fever.  Gastrointestinal: Negative for abdominal pain.  Genitourinary: Negative for dysuria, flank pain and hematuria.       Objective:   Physical Exam Vitals reviewed.  Constitutional:      Appearance: Normal appearance.  Cardiovascular:     Rate and Rhythm: Normal rate and regular rhythm.  Pulmonary:     Effort: Pulmonary effort is normal.     Breath sounds: Normal breath sounds.  Abdominal:     Comments: Wounds from recent laparoscopic surgery appear to be healing well.  Neurological:     Mental Status: He is alert.        Assessment:     #1  Recent complex right renal cyst.  He had decortication procedure and that went well.  No complications  #2 hypertension- stable.     Plan:     -He was given some resources to try to set up Covid vaccine  -continue current medications.  -follow up for yearly physical.  Jimmy Post MD [facility]

## 2019-12-09 ENCOUNTER — Telehealth (INDEPENDENT_AMBULATORY_CARE_PROVIDER_SITE_OTHER): Payer: Medicare HMO | Admitting: Family Medicine

## 2019-12-09 ENCOUNTER — Telehealth: Payer: Self-pay | Admitting: Family Medicine

## 2019-12-09 ENCOUNTER — Other Ambulatory Visit: Payer: Self-pay

## 2019-12-09 DIAGNOSIS — I10 Essential (primary) hypertension: Secondary | ICD-10-CM

## 2019-12-09 NOTE — Progress Notes (Signed)
This visit type was conducted due to national recommendations for restrictions regarding the COVID-19 pandemic in an effort to limit this patient's exposure and mitigate transmission in our community.   Virtual Visit via Video Note  I connected with Jimmy Rice on 12/09/19 at  3:00 PM EST by a video enabled telemedicine application and verified that I am speaking with the correct person using two identifiers.  Location patient: home Location provider:work or home office Persons participating in the virtual visit: patient, provider  I discussed the limitations of evaluation and management by telemedicine and the availability of in person appointments. The patient expressed understanding and agreed to proceed.   HPI:  Jimmy Rice called with concern for elevated blood pressure.  On Monday he went for eye exam and had blood pressure reading 150/79.  Also had some intermittent headaches that have been somewhat in the center of his head and bifrontal.  He takes Hyzaar already regularly and low-dose metoprolol.  He had previous hives with amlodipine.  No nonsteroidal use.  No recent nausea or vomiting.  No chest pains.  Not monitoring blood pressure currently at home.  No peripheral edema.  Some chronic kidney disease.  No alcohol use.  ROS: See pertinent positives and negatives per HPI.  Past Medical History:  Diagnosis Date  . Arthritis    "right knee" (07/22/2016)  . BENIGN PROSTATIC HYPERTROPHY, HX OF 04/04/2009  . Blood transfusion   . Childhood asthma   . Chronic kidney disease (CKD), stage III (moderate)    cysts on kidneys, 3rd stage kidney disease  . Complication of anesthesia    woke up "fighting"  confusion  . GERD 01/18/2009  . HYPERTENSION 01/18/2009  . HYPOGONADISM 01/18/2009    Past Surgical History:  Procedure Laterality Date  . COLONOSCOPY    . INGUINAL HERNIA REPAIR Right 1997  . NASAL SINUS SURGERY  1997  . PARTIAL KNEE ARTHROPLASTY Right 07/22/2016   Procedure:  UNICOMPARTMENTAL KNEE ARTHROPLASTY;  Surgeon: Renette Butters, MD;  Location: [facility];  Service: Orthopedics;  Laterality: Right;  . PROSTATE BIOPSY  X 5  . 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 TRANSURETHRAL RESECTION OF PROSTATE  12/2014    Family History  Problem Relation Age of Onset  . Breast cancer Mother   . Kidney disease Father   . Stomach cancer Paternal Aunt   . Breast cancer Sister   . Bladder Cancer Brother   . Colon cancer Neg Hx   . Esophageal cancer Neg Hx   . Rectal cancer Neg Hx     SOCIAL HX: Non-smoker   Current Outpatient Medications:  .  atorvastatin (LIPITOR) 20 MG tablet, TAKE 1 TABLET BY MOUTH EVERY DAY, Disp: 90 tablet, Rfl: 1 .  losartan-hydrochlorothiazide (HYZAAR) 100-12.5 MG tablet, , Disp: , Rfl:  .  metoprolol succinate (TOPROL-XL) 25 MG 24 hr tablet, Take 25 mg by mouth daily., Disp: , Rfl:   EXAM:  VITALS per patient if applicable:  GENERAL: alert, oriented, appears well and in no acute distress  HEENT: atraumatic, conjunttiva clear, no obvious abnormalities on inspection of external nose and ears  NECK: normal movements of the head and neck  LUNGS: on inspection no signs of respiratory distress, breathing rate appears normal, no obvious gross SOB, gasping or wheezing  CV: no obvious cyanosis  MS: moves all visible extremities without noticeable abnormality  PSYCH/NEURO: pleasant and cooperative, no obvious depression or anxiety, speech and thought processing grossly intact  ASSESSMENT AND PLAN:  Discussed  the following assessment and plan:  Hypertension.  Has had some recent mild elevations.  He has been controlled well on recent visits here  -We recommend he monitor over the next 1 to 2 weeks.  If consistently over 140/90 after that time be in touch.  We may need to look at additional medication such as hydralazine.     I discussed the assessment and treatment plan with the patient. The patient was provided an  opportunity to ask questions and all were answered. The patient agreed with the plan and demonstrated an understanding of the instructions.   The patient was advised to call back or seek an in-person evaluation if the symptoms worsen or if the condition fails to improve as anticipated.     Carolann Littler, MD

## 2019-12-09 NOTE — Telephone Encounter (Signed)
Called patient and he stated that he went to the eye doctor on Monday and had an elevated BP level and has been having some elevated BP levels and his headaches are becoming worse. Patient stated he is staying well hydrated. I have scheduled him for a Doxy visit at 3pm today. Sending as Juluis Rainier

## 2019-12-09 NOTE — Telephone Encounter (Signed)
Pt has been having headaches and believes it's because of his high blood pressure medication. Pt would like to know if his blood pressure medication can be changed? Thanks

## 2019-12-22 DIAGNOSIS — Z8249 Family history of ischemic heart disease and other diseases of the circulatory system: Secondary | ICD-10-CM | POA: Diagnosis not present

## 2019-12-22 DIAGNOSIS — M1990 Unspecified osteoarthritis, unspecified site: Secondary | ICD-10-CM | POA: Diagnosis not present

## 2019-12-22 DIAGNOSIS — C61 Malignant neoplasm of prostate: Secondary | ICD-10-CM | POA: Diagnosis not present

## 2019-12-22 DIAGNOSIS — Z803 Family history of malignant neoplasm of breast: Secondary | ICD-10-CM | POA: Diagnosis not present

## 2019-12-22 DIAGNOSIS — I10 Essential (primary) hypertension: Secondary | ICD-10-CM | POA: Diagnosis not present

## 2019-12-22 DIAGNOSIS — E785 Hyperlipidemia, unspecified: Secondary | ICD-10-CM | POA: Diagnosis not present

## 2019-12-22 DIAGNOSIS — I739 Peripheral vascular disease, unspecified: Secondary | ICD-10-CM | POA: Diagnosis not present

## 2019-12-22 DIAGNOSIS — K219 Gastro-esophageal reflux disease without esophagitis: Secondary | ICD-10-CM | POA: Diagnosis not present

## 2019-12-22 DIAGNOSIS — Z008 Encounter for other general examination: Secondary | ICD-10-CM | POA: Diagnosis not present

## 2019-12-22 DIAGNOSIS — Z8673 Personal history of transient ischemic attack (TIA), and cerebral infarction without residual deficits: Secondary | ICD-10-CM | POA: Diagnosis not present

## 2020-01-03 ENCOUNTER — Other Ambulatory Visit: Payer: Self-pay

## 2020-01-03 ENCOUNTER — Ambulatory Visit (INDEPENDENT_AMBULATORY_CARE_PROVIDER_SITE_OTHER): Payer: Medicare HMO | Admitting: Family Medicine

## 2020-01-03 ENCOUNTER — Encounter: Payer: Self-pay | Admitting: Family Medicine

## 2020-01-03 VITALS — BP 148/82 | HR 96 | Temp 97.7°F | Wt 209.3 lb

## 2020-01-03 DIAGNOSIS — I10 Essential (primary) hypertension: Secondary | ICD-10-CM

## 2020-01-03 MED ORDER — HYDRALAZINE HCL 25 MG PO TABS: 25.0000 mg | ORAL_TABLET | Freq: Three times a day (TID) | ORAL | 3 refills | Status: DC

## 2020-01-03 MED ORDER — LOSARTAN POTASSIUM-HCTZ 100-12.5 MG PO TABS: 1.0000 | ORAL_TABLET | Freq: Every day | ORAL | 3 refills | Status: DC

## 2020-01-03 NOTE — Progress Notes (Signed)
Acute Office Visit  Subjective:    Patient ID: Jimmy Rice, male    DOB: 02-09-1946, 74 y.o.   MRN: [id number]  Chief Complaint  Patient presents with  . Hypertension    Pt states his systolic number has been in 140s to 673A and dystolic has been 80 to 79 pt states he started taking 1.5 tablets and his dystolic got down to in the 50s  . Knee Pain    left knee keeps giving out     HPI Patient is in today for elevated BP for several weeks. States he self-treated with an additional 1/2 tablet of losartan-hctz 100-12.5 mg and BP did improve. He has a hx of CKD and goes to [place] for management with nephrologist there. He thinks his last serum creatinine was 1.8. He previously took chlorthalidone for BP but was advised by nephrologist to stop. He is getting back to exercising regularly after removal of kidney cysts in January. States he is careful with sodium in his diet, does most of his own cooking. Reports very good water consumption due to his kidney disease. His home BP readings are generally >140 mmHg although they did improve some when he increased his losartan-hctz. He denies recent weight gain, denies headache. Denies recent cardiac symptoms.   Past Medical History:  Diagnosis Date  . Arthritis    "right knee" (07/22/2016)  . BENIGN PROSTATIC HYPERTROPHY, HX OF 04/04/2009  . Blood transfusion   . Childhood asthma   . Chronic kidney disease (CKD), stage III (moderate)    cysts on kidneys, 3rd stage kidney disease  . Complication of anesthesia    woke up "fighting"  confusion  . GERD 01/18/2009  . HYPERTENSION 01/18/2009  . HYPOGONADISM 01/18/2009    Past Surgical History:  Procedure Laterality Date  . COLONOSCOPY    . INGUINAL HERNIA REPAIR Right 1997  . NASAL SINUS SURGERY  1997  . PARTIAL KNEE ARTHROPLASTY Right 07/22/2016   Procedure: UNICOMPARTMENTAL KNEE ARTHROPLASTY;  Surgeon: Renette Butters, MD;  Location: [place];  Service: Orthopedics;  Laterality: Right;  .  PROSTATE BIOPSY  X 5  . 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 TRANSURETHRAL RESECTION OF PROSTATE  12/2014    Family History  Problem Relation Age of Onset  . Breast cancer Mother   . Kidney disease Father   . Stomach cancer Paternal Aunt   . Breast cancer Sister   . Bladder Cancer Brother   . Colon cancer Neg Hx   . Esophageal cancer Neg Hx   . Rectal cancer Neg Hx     Social History   Socioeconomic History  . Marital status: Single    Spouse name: Not on file  . Number of children: Not on file  . Years of education: Not on file  . Highest education level: Not on file  Occupational History  . Not on file  Tobacco Use  . Smoking status: Former Smoker    Types: Cigarettes  . Smokeless tobacco: Never Used  . Tobacco comment: "quit smoking in the late 1960's; smoked < 1 pack cigarettes TOTAL"  Substance and Sexual Activity  . Alcohol use: Not Currently    Alcohol/week: 1.0 standard drinks    Types: 1 Cans of beer per week    Comment: none  . Drug use: No  . Sexual activity: Yes  Other Topics Concern  . Not on file  Social History Narrative   Retired from Economist  handed   One story home   Social Determinants of Health   Financial Resource Strain:   . Difficulty of Paying Living Expenses:   Food Insecurity:   . Worried About Charity fundraiser in the Last Year:   . Arboriculturist in the Last Year:   Transportation Needs:   . Film/video editor (Medical):   Marland Kitchen Lack of Transportation (Non-Medical):   Physical Activity:   . Days of Exercise per Week:   . Minutes of Exercise per Session:   Stress:   . Feeling of Stress :   Social Connections:   . Frequency of Communication with Friends and Family:   . Frequency of Social Gatherings with Friends and Family:   . Attends Religious Services:   . Active Member of Clubs or Organizations:   . Attends Archivist Meetings:   Marland Kitchen Marital Status:   Intimate  Partner Violence:   . Fear of Current or Ex-Partner:   . Emotionally Abused:   Marland Kitchen Physically Abused:   . Sexually Abused:     Outpatient Medications Prior to Visit  Medication Sig Dispense Refill  . atorvastatin (LIPITOR) 20 MG tablet TAKE 1 TABLET BY MOUTH EVERY DAY 90 tablet 1  . losartan-hydrochlorothiazide (HYZAAR) 100-12.5 MG tablet 1 tablet. Pt taking 1.5 tablets    . metoprolol succinate (TOPROL-XL) 25 MG 24 hr tablet Take 25 mg by mouth daily.     No facility-administered medications prior to visit.    Allergies  Allergen Reactions  . Flomax [Tamsulosin Hcl] Other (See Comments)    DIFFICULTY BREATHING NASAL STUFFINESS  . Amlodipine Hives    Review of Systems  Constitutional: Negative for activity change, appetite change, chills, fatigue and fever.  HENT: Negative.   Eyes: Negative.   Respiratory: Negative for chest tightness and shortness of breath.   Cardiovascular: Negative for chest pain and palpitations.  Gastrointestinal: Negative for abdominal distention and nausea.  Endocrine: Negative.   Genitourinary: Negative.   Musculoskeletal: Negative for gait problem.  Skin: Negative.   Neurological: Negative for dizziness, light-headedness and headaches.  Hematological: Negative.   Psychiatric/Behavioral: Negative.        Objective:    Physical Exam Constitutional:      Appearance: Normal appearance.  HENT:     Head: Normocephalic and atraumatic.     Mouth/Throat:     Mouth: Mucous membranes are moist.  Eyes:     Extraocular Movements: Extraocular movements intact.  Cardiovascular:     Rate and Rhythm: Normal rate and regular rhythm.     Pulses: Normal pulses.     Heart sounds: Normal heart sounds. No murmur. No friction rub. No gallop.   Pulmonary:     Effort: Pulmonary effort is normal.     Breath sounds: Normal breath sounds.  Abdominal:     Palpations: Abdomen is soft.  Musculoskeletal:        General: No swelling. Normal range of motion.      Cervical back: Normal range of motion.  Skin:    General: Skin is warm and dry.  Neurological:     General: No focal deficit present.     Mental Status: He is alert and oriented to person, place, and time.  Psychiatric:        Mood and Affect: Mood normal.        Behavior: Behavior normal.        Thought Content: Thought content normal.  Judgment: Judgment normal.     BP (!) 148/82 (BP Location: Right Arm, Patient Position: Sitting, Cuff Size: Normal)   Pulse 96   Temp 97.7 F (36.5 C) (Temporal)   Wt 209 lb 4.8 oz (94.9 kg)   SpO2 99%   BMI 28.39 kg/m  Wt Readings from Last 3 Encounters:  01/03/20 209 lb 4.8 oz (94.9 kg)  11/09/19 205 lb 11.2 oz (93.3 kg)  09/15/19 200 lb (90.7 kg)    Health Maintenance Due  Topic Date Due  . TETANUS/TDAP  10/07/2015    There are no preventive care reminders to display for this patient.   Lab Results  Component Value Date   TSH 2.51 05/19/2018   Lab Results  Component Value Date   WBC 4.4 02/23/2019   HGB 13.9 02/23/2019   HCT 42.7 02/23/2019   MCV 90.7 02/23/2019   PLT 173 02/23/2019   Lab Results  Component Value Date   NA 141 03/02/2019   K 3.7 03/02/2019   CO2 29 03/02/2019   GLUCOSE 105 (H) 03/02/2019   BUN 24 (H) 03/02/2019   CREATININE 1.69 (H) 03/02/2019   BILITOT 0.9 04/29/2019   ALKPHOS 59 04/29/2019   AST 16 04/29/2019   ALT 17 04/29/2019   PROT 7.3 04/29/2019   ALBUMIN 4.3 04/29/2019   CALCIUM 9.2 03/02/2019   ANIONGAP 11 02/23/2019   GFR 48.32 (L) 03/02/2019   Lab Results  Component Value Date   CHOL 139 04/29/2019   Lab Results  Component Value Date   HDL 47.80 04/29/2019   Lab Results  Component Value Date   LDLCALC 76 04/29/2019   Lab Results  Component Value Date   TRIG 76.0 04/29/2019   Lab Results  Component Value Date   CHOLHDL 3 04/29/2019   Lab Results  Component Value Date   HGBA1C 5.4 03/02/2019       Assessment & Plan:   Problem List Items Addressed This  Visit    None     Assessment: Repeat BP in left arm is 138/80 mmHg. Due to CKD and allergy to amlodipine, need to be careful about which agents to start. No lower extremity swelling noted.    Plan: Will add hydralazine 25 mg three times daily.  Advised that he cannot increase his current losartan-hctz dose due to exceeding maximum daily dose of losartan.  Will check BMET today. Follow up in 2 weeks.    No orders of the defined types were placed in this encounter.    Emmaline Life, RN, BSN AGPCNP Student, [employer] [facility]  Pt examined with student and agree with notes above.   He has had several recent BP readings > 882 systolic.  CKD as above.  Prior intolerance with CCB.   Will add Hydralazine 25 mg tid and follow up in 2-3 weeks to reassess.   Eulas Post MD [facility]

## 2020-01-03 NOTE — Patient Instructions (Addendum)
We will check your kidney function today  Start hydralazine (Apresoline) 25 mg 3 times per day (morning, lunch, and dinner)  We have refilled your Losartan-HCTZ 100-12.5 mg once daily  Follow up in 2 weeks

## 2020-01-04 LAB — BASIC METABOLIC PANEL
BUN: 23 mg/dL (ref 6–23)
CO2: 27 mEq/L (ref 19–32)
Calcium: 9.6 mg/dL (ref 8.4–10.5)
Chloride: 101 mEq/L (ref 96–112)
Creatinine, Ser: 1.71 mg/dL — ABNORMAL HIGH (ref 0.40–1.50)
GFR: 47.56 mL/min — ABNORMAL LOW (ref >60.00)
Glucose, Bld: 133 mg/dL — ABNORMAL HIGH (ref 70–99)
Potassium: 4 mEq/L (ref 3.5–5.1)
Sodium: 138 mEq/L (ref 135–145)

## 2020-01-16 ENCOUNTER — Other Ambulatory Visit: Payer: Self-pay

## 2020-01-16 ENCOUNTER — Encounter: Payer: Self-pay | Admitting: Family

## 2020-01-16 ENCOUNTER — Ambulatory Visit (INDEPENDENT_AMBULATORY_CARE_PROVIDER_SITE_OTHER): Payer: Medicare HMO | Admitting: Family

## 2020-01-16 VITALS — BP 120/80 | HR 109 | Ht 72.0 in | Wt 204.4 lb

## 2020-01-16 DIAGNOSIS — I493 Ventricular premature depolarization: Secondary | ICD-10-CM | POA: Diagnosis not present

## 2020-01-16 DIAGNOSIS — I10 Essential (primary) hypertension: Secondary | ICD-10-CM | POA: Diagnosis not present

## 2020-01-16 DIAGNOSIS — E782 Mixed hyperlipidemia: Secondary | ICD-10-CM | POA: Diagnosis not present

## 2020-01-16 MED ORDER — METOPROLOL SUCCINATE ER 50 MG PO TB24: 50.0000 mg | ORAL_TABLET | Freq: Every day | ORAL | 2 refills | Status: DC

## 2020-01-16 NOTE — Patient Instructions (Addendum)
Medication Instructions:  1- INCREASE Toprol to 1 tablet (50 mg total) once daily *If you need a refill on your cardiac medications before your next appointment, please call your pharmacy*   Lab Work: None ordered  If you have labs (blood work) drawn today and your tests are completely normal, you will receive your results only by: Marland Kitchen MyChart Message (if you have MyChart) OR . A paper copy in the mail If you have any lab test that is abnormal or we need to change your treatment, we will call you to review the results.   Testing/Procedures: None ordered  Follow-Up: At [facility], you and your health needs are our priority.  As part of our continuing mission to provide you with exceptional heart care, we have created designated Provider Care Teams.  These Care Teams include your primary Cardiologist (physician) and Advanced Practice Providers (APPs -  Physician Assistants and Nurse Practitioners) who all work together to provide you with the care you need, when you need it.  We recommend signing up for the patient portal called "MyChart".  Sign up information is provided on this After Visit Summary.  MyChart is used to connect with patients for Virtual Visits (Telemedicine).  Patients are able to view lab/test results, encounter notes, upcoming appointments, etc.  Non-urgent messages can be sent to your provider as well.   To learn more about what you can do with MyChart, go to [email or web address].    Your next appointment:  1 month Please establish primary cardiologist at your next visit.

## 2020-01-16 NOTE — Progress Notes (Signed)
Office Visit    Patient Name: Jimmy Rice Date of Encounter: 01/16/2020  Primary Care Provider:  Eulas Post, MD Primary Cardiologist:  Kathlyn Sacramento, MD Electrophysiologist:  None   Chief Complaint    Jimmy Rice is a 74 y.o. 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 Arthritis    "right knee" (07/22/2016)  . BENIGN PROSTATIC HYPERTROPHY, HX OF 04/04/2009  . Blood transfusion   . Childhood asthma   . Chronic kidney disease (CKD), stage III (moderate)    cysts on kidneys, 3rd stage kidney disease  . Complication of anesthesia    woke up "fighting"  confusion  . GERD 01/18/2009  . HYPERTENSION 01/18/2009  . HYPOGONADISM 01/18/2009   Past Surgical History:  Procedure Laterality Date  . COLONOSCOPY    . INGUINAL HERNIA REPAIR Right 1997  . NASAL SINUS SURGERY  1997  . PARTIAL KNEE ARTHROPLASTY Right 07/22/2016   Procedure: UNICOMPARTMENTAL KNEE ARTHROPLASTY;  Surgeon: Renette Butters, MD;  Location: [place];  Service: Orthopedics;  Laterality: Right;  . PROSTATE BIOPSY  X 5  . 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 TRANSURETHRAL RESECTION OF PROSTATE  12/2014    Allergies  Allergies  Allergen Reactions  . Amlodipine Hives  . Flomax [Tamsulosin Hcl] Other (See Comments)    DIFFICULTY BREATHING NASAL STUFFINESS  . Tamsulosin Other (See Comments)    DIFFICULTY BREATHING NASAL STUFFINESS DIFFICULTY BREATHING NASAL STUFFINESS    History of Present Illness    Jimmy Rice is a 74 y.o. 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 Fletcher Anon.  He was initially referred to cardiology for evaluation palpitations and syncope.  May 2020 new episodes palpitations  orthostatic dizziness.  One episode of brief loss consciousness after hitting his head on the mantle. See by neurology June 2020 with MRI brain with remote right cerebellar stroke not felt to be causing symptoms.  CTA neck and brain did not show evidence of vascular etiology.  Monitor ordered by primary care July 2020 with NSR, PVC, and 1 instance of 7 beat run of NSVT.   Previously seen by cardiologist in [place] with extensive work-up including echo, stress test, monitor. He was told about PVCs and started on Toprol with improvement in symptoms.  Per Dr. Verne Grain notes [facility] in [place]) and [place] stress echo 06/2019 with no evidence of ischemia, Echo 06/2019 LVEF 55-60%, mild LVH, moderate LA enlargement, RV normal size and function, no significant valvular dysfunction.   Reports feeling well. Enjoys working in his garden and walking for physical activity. Also goes to the gym.  Tells me when he walks on the treadmill he feels he gets fatigued more quickly than he should.  Overall feeling more fatigued over the last 9 months-tells me he used to be able to do 18 holes of golf but now finds he has to do 9 holes at a time.  Was recently started on hydralazine by his primary care provider.  Tells me his blood pressure has been much improved since starting this medication.  Checks regularly at home.  Tells me while sitting in the waiting room his heart began pounding.  Notices his  palpitations have been happening more often.  Drinks caffeine intermittently.  No alcohol use.  Reports no recent increase stress.  No over-the-counter nonprescription proarrhythmic medications noted.  Asks about the connection between his kidney function and his heart.  We reviewed how the kidneys filter medications.  Reviewed how high blood pressure can affect kidney function.  Also reviewed renal duplex from his work-up in [place] that showed no evidence of renal artery stenosis.   EKGs/Labs/Other Studies  Reviewed:   The following studies were reviewed today:  Zio 06/16/19, 04:34pm to 06/27/19, 09:03am   Patient had a min HR of 39 bpm, max HR of 179 bpm, and avg HR of 76 bpm. Predominant underlying rhythm was Sinus Rhythm. Slight P wave morphology changes were noted. 5 Ventricular Tachycardia runs occurred, the run with the fastest interval lasting 7 beats with a max rate of 179 bpm (avg 134 bpm); the run with the fastest interval was also the longest. 14 Supraventricular Tachycardia runs occurred, the run with the fastest interval lasting 4 beats with a max rate of 150 bpm, the longest lasting 14 beats with an avg rate of 98 bpm. Some episodes of Supraventricular Tachycardia may be possible Atrial Tachycardia with variable block. Isolated SVEs were frequent (10.8%, [id number]), SVE Couplets were rare (<1.0%, 15), and SVE Triplets were rare (<1.0%, 9). Isolated VEs were rare (<1.0%, 3394), VE Couplets were rare (<1.0%, 60), and VE Triplets were rare (<1.0%, 1). Ventricular Bigeminy was present.  Stress echo 06/2019 negative for ischemia Mild LVH, normal LVEF, RV normal, moderate LAE, no significant valve disease   EKG:  EKG is ordered today.  The ekg ordered today demonstrates sinus tachycardia 109 bpm with occasional PVC and PAC  Recent Labs: 02/23/2019: Hemoglobin 13.9; Platelets 173 04/29/2019: ALT 17 01/03/2020: BUN 23; Creatinine, Ser 1.71; Potassium 4.0; Sodium 138  Recent Lipid Panel    Component Value Date/Time   CHOL 139 04/29/2019 1018   TRIG 76.0 04/29/2019 1018   HDL 47.80 04/29/2019 1018   CHOLHDL 3 04/29/2019 1018   VLDL 15.2 04/29/2019 1018   LDLCALC 76 04/29/2019 1018   LDLDIRECT 106.0 05/19/2018 1510    Home Medications   Current Meds  Medication Sig  . atorvastatin (LIPITOR) 20 MG tablet TAKE 1 TABLET BY MOUTH EVERY DAY  . hydrALAZINE (APRESOLINE) 25 MG tablet Take 1 tablet (25 mg total) by mouth 3 (three) times daily.  Marland Kitchen losartan-hydrochlorothiazide (HYZAAR) 100-12.5 MG  tablet 1 tablet. Pt taking 1.5 tablets  . [DISCONTINUED] metoprolol succinate (TOPROL-XL) 25 MG 24 hr tablet Take 25 mg by mouth daily.    Review of Systems    Review of Systems  Constitution: Positive for malaise/fatigue. Negative for chills and fever.  Cardiovascular: Positive for palpitations. Negative for chest pain, dyspnea on exertion, leg swelling, near-syncope, orthopnea and syncope.  Respiratory: Negative for cough, shortness of breath and wheezing.   Gastrointestinal: Negative for nausea and vomiting.  Neurological: Negative for dizziness, light-headedness and weakness.   All other systems reviewed and are otherwise negative except as noted above.  Physical Exam    VS:  BP 120/80 (BP Location: Left Arm, Patient Position: Sitting, Cuff Size: Normal)   Pulse (!) 109   Ht 6' (1.829 m)   Wt 204 lb 6 oz (92.7 kg)   SpO2 98%   BMI 27.72 kg/m  , BMI Body mass index is 27.72 kg/m. GEN: Well nourished, well developed, in no acute distress. HEENT: normal. Neck: Supple, no JVD, carotid bruits, or  masses. Cardiac: Tachycardic and regular, no murmurs, rubs, or gallops. No clubbing, cyanosis, edema.  Radials/DP/PT 2+ and equal bilaterally.  Respiratory:  Respirations regular and unlabored, clear to auscultation bilaterally. GI: Soft, nontender, nondistended, BS + x 4. MS: No deformity or atrophy. Skin: Warm and dry, no rash. Neuro:  Strength and sensation are intact. Psych: Normal affect.  Accessory Clinical Findings    ECG personally reviewed by me today -sinus tachycardia 109 bpm with occasional PVC and PAC.- no acute changes.  Assessment & Plan    1. Symptomatic PVCs -Underwent work-up in [place] around August 2020 with Zio monitor, normal stress echo, and TTE. Started on Toprol 25mg  with improvement in symptoms. Reports increased palpitations over the last few months. Recent labs with normal electrolytes. EKG today ST 109 bpm with occasional PAC and PVC.  Increase Toprol to  50mg  daily. Recommend avoid caffeine, etoh.   2. Syncope -Previous loss of consciousness likely related to concussion has happened after significant head trauma. No recurrent lightheadedness, dizziness, near-syncope, syncope. No indication for further evaluation.   3. HTN - Blood pressure well controlled today.  Continue hydralazine 25 mg 3 times daily, Hyzaar 100-12.5. He was previously self-medicating elevated BP  By taking 1.5 tablet of his Hyzaar which is more than maximum dose. Has follow up with primary care regarding BP tomorrow. Unclear whether he is taking 1.5 or 1 tablet of Hyzaar - will route to PCP for clarification during his appiontment tomorrow.   4. Fatigue - Reports fatigue over the last nine months. Tells me he feels he doesn't have the energy he used to. Tachycardia and PVC may be contributory - increase Metoprolol, as above. If symptoms remain, could consider transition to different beta blocker such as Acebutolol.   5. HLD -lipid panel July 2020 with LDL 76.  Continue atorvastatin 20 mg daily per PCP.  Disposition: Follow up in 1 month(s) with Arida.    Loel Dubonnet, NP 01/16/2020, 3:12 PM

## 2020-01-17 ENCOUNTER — Ambulatory Visit (INDEPENDENT_AMBULATORY_CARE_PROVIDER_SITE_OTHER): Payer: Medicare HMO | Admitting: Family Medicine

## 2020-01-17 ENCOUNTER — Encounter: Payer: Self-pay | Admitting: Family Medicine

## 2020-01-17 VITALS — BP 112/70 | HR 86 | Temp 98.0°F | Wt 203.6 lb

## 2020-01-17 DIAGNOSIS — I10 Essential (primary) hypertension: Secondary | ICD-10-CM

## 2020-01-17 DIAGNOSIS — N183 Chronic kidney disease, stage 3 unspecified: Secondary | ICD-10-CM

## 2020-01-17 NOTE — Progress Notes (Signed)
Subjective:     Patient ID: Jimmy Rice, male   DOB: 07/19/1946, 74 y.o.   MRN: [id number]  HPI Ms. Demore is seen for follow-up regarding hypertension.  He had very poor control when seen here recently.  We started hydralazine which he is tolerating well and blood pressure has improved.  He saw cardiologist yesterday and pulse was still up at 109 and they increase his metoprolol to 50 mg daily.  There was some question regarding his dosing of hydralazine.  He is currently taking 1 daily though his chart says 1-1/2 daily.  He had a headache Saturday which he attributed to some allergies.  None since then.  No chest pains.  No dizziness.  Past Medical History:  Diagnosis Date  . Arthritis    "right knee" (07/22/2016)  . BENIGN PROSTATIC HYPERTROPHY, HX OF 04/04/2009  . Blood transfusion   . Childhood asthma   . Chronic kidney disease (CKD), stage III (moderate)    cysts on kidneys, 3rd stage kidney disease  . Complication of anesthesia    woke up "fighting"  confusion  . GERD 01/18/2009  . HYPERTENSION 01/18/2009  . HYPOGONADISM 01/18/2009   Past Surgical History:  Procedure Laterality Date  . COLONOSCOPY    . INGUINAL HERNIA REPAIR Right 1997  . NASAL SINUS SURGERY  1997  . PARTIAL KNEE ARTHROPLASTY Right 07/22/2016   Procedure: UNICOMPARTMENTAL KNEE ARTHROPLASTY;  Surgeon: Renette Butters, MD;  Location: [place];  Service: Orthopedics;  Laterality: Right;  . PROSTATE BIOPSY  X 5  . 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 TRANSURETHRAL RESECTION OF PROSTATE  12/2014    reports that he has quit smoking. His smoking use included cigarettes. He has never used smokeless tobacco. He reports previous alcohol use of about 1.0 standard drinks of alcohol per week. He reports that he does not use drugs. family history includes Bladder Cancer in his brother; Breast cancer in his mother and sister; Kidney disease in his father; Stomach cancer in his paternal aunt. Allergies   Allergen Reactions  . Amlodipine Hives  . Flomax [Tamsulosin Hcl] Other (See Comments)    DIFFICULTY BREATHING NASAL STUFFINESS  . Tamsulosin Other (See Comments)    DIFFICULTY BREATHING NASAL STUFFINESS DIFFICULTY BREATHING NASAL STUFFINESS     Review of Systems  Constitutional: Negative for fatigue.  Eyes: Negative for visual disturbance.  Respiratory: Negative for cough, chest tightness and shortness of breath.   Cardiovascular: Negative for chest pain, palpitations and leg swelling.  Neurological: Negative for dizziness, syncope, weakness, light-headedness and headaches.       Objective:   Physical Exam Constitutional:      Appearance: He is well-developed.  HENT:     Right Ear: External ear normal.     Left Ear: External ear normal.  Eyes:     Pupils: Pupils are equal, round, and reactive to light.  Neck:     Thyroid: No thyromegaly.  Cardiovascular:     Rate and Rhythm: Normal rate and regular rhythm.  Pulmonary:     Effort: Pulmonary effort is normal. No respiratory distress.     Breath sounds: Normal breath sounds. No wheezing or rales.  Musculoskeletal:     Cervical back: Neck supple.  Neurological:     Mental Status: He is alert and oriented to person, place, and time.        Assessment:     Hypertension-improved with recent addition of hydralazine    Plan:     -We  clarified his medications and his Hyzaar is once daily  -Continue his metoprolol 50 mg daily and pulse does seem to be improved today compared with visit yesterday at cardiology  -Continue hydralazine 25 mg 3 times daily  -Routine medical follow-up in 3 months and sooner as needed  Eulas Post MD [facility] at [facility]

## 2020-01-20 DIAGNOSIS — I129 Hypertensive chronic kidney disease with stage 1 through stage 4 chronic kidney disease, or unspecified chronic kidney disease: Secondary | ICD-10-CM | POA: Diagnosis not present

## 2020-01-20 DIAGNOSIS — N1832 Chronic kidney disease, stage 3b: Secondary | ICD-10-CM | POA: Diagnosis not present

## 2020-01-20 DIAGNOSIS — E559 Vitamin D deficiency, unspecified: Secondary | ICD-10-CM | POA: Diagnosis not present

## 2020-02-06 ENCOUNTER — Other Ambulatory Visit: Payer: Self-pay | Admitting: Family Medicine

## 2020-02-21 ENCOUNTER — Encounter: Payer: Self-pay | Admitting: Cardiovascular Disease

## 2020-02-21 ENCOUNTER — Other Ambulatory Visit: Payer: Self-pay

## 2020-02-21 ENCOUNTER — Ambulatory Visit (INDEPENDENT_AMBULATORY_CARE_PROVIDER_SITE_OTHER): Payer: Medicare HMO | Admitting: Cardiovascular Disease

## 2020-02-21 VITALS — BP 124/62 | HR 77 | Ht 72.0 in | Wt 204.4 lb

## 2020-02-21 DIAGNOSIS — E785 Hyperlipidemia, unspecified: Secondary | ICD-10-CM | POA: Diagnosis not present

## 2020-02-21 DIAGNOSIS — I493 Ventricular premature depolarization: Secondary | ICD-10-CM | POA: Diagnosis not present

## 2020-02-21 DIAGNOSIS — I10 Essential (primary) hypertension: Secondary | ICD-10-CM | POA: Diagnosis not present

## 2020-02-21 NOTE — Patient Instructions (Signed)

## 2020-02-21 NOTE — Progress Notes (Signed)
Cardiology Office Note   Date:  02/21/2020   ID:  BOLIVAR KORANDA, DOB 04/08/1946, MRN [id number]  PCP:  Eulas Post, MD  Cardiologist:   Kathlyn Sacramento, MD   Chief Complaint  Patient presents with  . office visit    Pt has no complaints. Meds verbally reviewed w/ pt.       History of Present Illness: Jimmy Rice is a 74 y.o. male who is here today for follow-up visit regarding symptomatic PVCs.  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 In May, he started having episodes of palpitations and orthostatic dizziness.  He had one incident where he hit his head hard on the fireplace mantle and then he found himself on the floor after an apparent loss of consciousness. He was evaluated by neurology in June.  He had MRI of the brain which showed remote right cerebellar stroke which was not felt to be the cause of his symptoms.  CTA of the neck and brain did not show evidence of a vascular etiology. The patient was seen by a cardiologist in [place] and had extensive work-up including an echo, stress test and a monitor.  Stress echo showed no evidence of ischemia.  Echocardiogram showed an EF of 55 to 60%, mild LVH, moderate left atrial enlargement and no significant valvular abnormalities.  Outpatient monitor showed PVCs and short NSVT. The dose of Toprol was increased by Laurann Montana last month of 50 mg daily.  No further palpitations.  He does complain of fatigue but no chest pain or shortness of breath.  He does not sleep well at night.  Past Medical History:  Diagnosis Date  . Arthritis    "right knee" (07/22/2016)  . BENIGN PROSTATIC HYPERTROPHY, HX OF 04/04/2009  . Blood transfusion   . Childhood asthma   . Chronic kidney disease (CKD), stage III (moderate)    cysts on kidneys, 3rd stage kidney disease  . Complication of anesthesia    woke up "fighting"   confusion  . GERD 01/18/2009  . HYPERTENSION 01/18/2009  . HYPOGONADISM 01/18/2009    Past Surgical History:  Procedure Laterality Date  . COLONOSCOPY    . INGUINAL HERNIA REPAIR Right 1997  . NASAL SINUS SURGERY  1997  . PARTIAL KNEE ARTHROPLASTY Right 07/22/2016   Procedure: UNICOMPARTMENTAL KNEE ARTHROPLASTY;  Surgeon: Renette Butters, MD;  Location: [facility];  Service: Orthopedics;  Laterality: Right;  . PROSTATE BIOPSY  X 5  . 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 TRANSURETHRAL RESECTION OF PROSTATE  12/2014     Current Outpatient Medications  Medication Sig Dispense Refill  . atorvastatin (LIPITOR) 20 MG tablet TAKE 1 TABLET BY MOUTH EVERY DAY 90 tablet 1  . hydrALAZINE (APRESOLINE) 25 MG tablet Take 1 tablet (25 mg total) by mouth 3 (three) times daily. 270 tablet 3  . losartan-hydrochlorothiazide (HYZAAR) 100-12.5 MG tablet 1 tablet. Take one tablet by mouth once daily    . metoprolol succinate (TOPROL XL) 50 MG 24 hr tablet Take 1 tablet (50 mg total) by mouth daily. Take with or immediately following a meal. 30 tablet 2   No current facility-administered medications for this visit.    Allergies:   Amlodipine, Flomax [tamsulosin hcl], and Tamsulosin    Social History:  The patient  reports that he has quit smoking. His smoking use included cigarettes. He has never used smokeless  tobacco. He reports previous alcohol use of about 1.0 standard drinks of alcohol per week. He reports that he does not use drugs.   Family History:  The patient's family history includes Bladder Cancer in his brother; Breast cancer in his mother and sister; Kidney disease in his father; Stomach cancer in his paternal aunt.    ROS:  Please see the history of present illness.   Otherwise, review of systems are positive for none.   All other systems are reviewed and negative.    PHYSICAL EXAM: VS:  BP 124/62 (BP Location: Left Arm, Patient Position: Sitting, Cuff Size: Normal)    Pulse 77   Ht 6' (1.829 m)   Wt 204 lb 6 oz (92.7 kg)   SpO2 97%   BMI 27.72 kg/m  , BMI Body mass index is 27.72 kg/m. GEN: Well nourished, well developed, in no acute distress  HEENT: normal  Neck: no JVD, carotid bruits, or masses Cardiac: RRR; no murmurs, rubs, or gallops,no edema  Respiratory:  clear to auscultation bilaterally, normal work of breathing GI: soft, nontender, nondistended, + BS MS: no deformity or atrophy  Skin: warm and dry, no rash Neuro:  Strength and sensation are intact Psych: euthymic mood, full affect   EKG:  EKG is ordered today. The ekg ordered today demonstrates normal sinus rhythm with a PVC, LVH.   Recent Labs: 02/23/2019: Hemoglobin 13.9; Platelets 173 04/29/2019: ALT 17 01/03/2020: BUN 23; Creatinine, Ser 1.71; Potassium 4.0; Sodium 138    Lipid Panel    Component Value Date/Time   CHOL 139 04/29/2019 1018   TRIG 76.0 04/29/2019 1018   HDL 47.80 04/29/2019 1018   CHOLHDL 3 04/29/2019 1018   VLDL 15.2 04/29/2019 1018   LDLCALC 76 04/29/2019 1018   LDLDIRECT 106.0 05/19/2018 1510      Wt Readings from Last 3 Encounters:  02/21/20 204 lb 6 oz (92.7 kg)  01/17/20 203 lb 9.6 oz (92.4 kg)  01/16/20 204 lb 6 oz (92.7 kg)     PAD Screen 07/14/2019  Previous PAD dx? No  Previous surgical procedure? No  Pain with walking? No  Feet/toe relief with dangling? No  Painful, non-healing ulcers? No  Extremities discolored? No      ASSESSMENT AND PLAN:  1.  Symptomatic PVCs: Symptoms are now well controlled on current dose of Toprol.  I elected to make no changes.  I do not think his excessive fatigue is related to treatment with a beta-blocker.  His resting heart rate continues to be in the 70s.  2.  Essential hypertension: Blood pressure is reasonably controlled on current medications.  3.  Hyperlipidemia: Currently on atorvastatin 20 mg daily.  Most recent lipid profile showed an LDL of 76.  4.  Chronic kidney disease: Followed by  nephrology and has been stable.   Disposition:   FU with me in 6 months  Signed,  Kathlyn Sacramento, MD  02/21/2020 3:03 PM    [facility]

## 2020-04-07 ENCOUNTER — Other Ambulatory Visit: Payer: Self-pay | Admitting: Family

## 2020-05-13 DIAGNOSIS — Z20828 Contact with and (suspected) exposure to other viral communicable diseases: Secondary | ICD-10-CM | POA: Diagnosis not present

## 2020-06-04 LAB — VITAMIN B12: Vitamin B-12: 865

## 2020-06-04 LAB — CBC AND DIFFERENTIAL
HCT: 41 (ref 41–53)
Hemoglobin: 13.8 (ref 13.5–17.5)
Neutrophils Absolute: 2
Platelets: 185 (ref 150–399)
WBC: 4.1

## 2020-06-04 LAB — CBC: RBC: 4.58 (ref 3.87–5.11)

## 2020-06-04 LAB — BASIC METABOLIC PANEL
BUN: 22 — AB (ref 4–21)
CO2: 27 — AB (ref 13–22)
Chloride: 104 (ref 99–108)
Creatinine: 1.9 — AB (ref <=1.3)
Glucose: 118
Potassium: 3.7 (ref 3.4–5.3)
Sodium: 137 (ref 137–147)

## 2020-06-04 LAB — HEPATIC FUNCTION PANEL
ALT: 59 — AB (ref 10–40)
AST: 18 (ref 14–40)
Alkaline Phosphatase: 76 (ref 25–125)
Bilirubin, Direct: 0.3
Bilirubin, Total: 1.2

## 2020-06-04 LAB — HEMOGLOBIN A1C: Hemoglobin A1C: 5.7

## 2020-06-04 LAB — COMPREHENSIVE METABOLIC PANEL
Albumin: 4.2 (ref 3.5–5.0)
Calcium: 9.4 (ref 8.7–10.7)
GFR calc Af Amer: 42

## 2020-06-04 LAB — LIPID PANEL
HDL: 55 (ref 35–70)
LDL Cholesterol: 80
Triglycerides: 86 (ref 40–160)

## 2020-06-04 LAB — VITAMIN D 25 HYDROXY (VIT D DEFICIENCY, FRACTURES): Vit D, 25-Hydroxy: 45.68

## 2020-06-04 LAB — TSH: TSH: 1.99 (ref <=5.90)

## 2020-06-04 LAB — PSA: PSA: 9.38

## 2020-06-07 ENCOUNTER — Other Ambulatory Visit: Payer: Self-pay

## 2020-06-07 ENCOUNTER — Ambulatory Visit (INDEPENDENT_AMBULATORY_CARE_PROVIDER_SITE_OTHER): Payer: Medicare HMO

## 2020-06-07 DIAGNOSIS — H9190 Unspecified hearing loss, unspecified ear: Secondary | ICD-10-CM

## 2020-06-07 DIAGNOSIS — Z0000 Encounter for general adult medical examination without abnormal findings: Secondary | ICD-10-CM

## 2020-06-07 DIAGNOSIS — Z0100 Encounter for examination of eyes and vision without abnormal findings: Secondary | ICD-10-CM

## 2020-06-07 NOTE — Patient Instructions (Signed)
Mr. Jimmy Rice , Thank you for taking time to come for your Medicare Wellness Visit. I appreciate your ongoing commitment to your health goals. Please review the following plan we discussed and let me know if I can assist you in the future.   Screening recommendations/referrals: Colonoscopy: Up to date, next due 06/24/2022 Recommended yearly ophthalmology/optometry visit for glaucoma screening and checkup Recommended yearly dental visit for hygiene and checkup  Vaccinations: Influenza vaccine: Up to date, next due this fall 2021 Pneumococcal vaccine: Completed series Tdap vaccine: Currently due, you may contact your insurance company to discuss cost or you may await and injury to receive  Shingles vaccine: Currently due, please contact your pharmacy to discuss cost and to receive the vaccine    Advanced directives: Please bring a copy of your medical advanced directives to the office so that we may scan them into your chart.  Conditions/risks identified: None   Next appointment: None   Preventive Care 65 Years and Older, Male Preventive care refers to lifestyle choices and visits with your health care provider that can promote health and wellness. What does preventive care include?  A yearly physical exam. This is also called an annual well check.  Dental exams once or twice a year.  Routine eye exams. Ask your health care provider how often you should have your eyes checked.  Personal lifestyle choices, including:  Daily care of your teeth and gums.  Regular physical activity.  Eating a healthy diet.  Avoiding tobacco and drug use.  Limiting alcohol use.  Practicing safe sex.  Taking low doses of aspirin every day.  Taking vitamin and mineral supplements as recommended by your health care provider. What happens during an annual well check? The services and screenings done by your health care provider during your annual well check will depend on your age, overall health,  lifestyle risk factors, and family history of disease. Counseling  Your health care provider may ask you questions about your:  Alcohol use.  Tobacco use.  Drug use.  Emotional well-being.  Home and relationship well-being.  Sexual activity.  Eating habits.  History of falls.  Memory and ability to understand (cognition).  Work and work Statistician. Screening  You may have the following tests or measurements:  Height, weight, and BMI.  Blood pressure.  Lipid and cholesterol levels. These may be checked every 5 years, or more frequently if you are over 45 years old.  Skin check.  Lung cancer screening. You may have this screening every year starting at age 84 if you have a 30-pack-year history of smoking and currently smoke or have quit within the past 15 years.  Fecal occult blood test (FOBT) of the stool. You may have this test every year starting at age 23.  Flexible sigmoidoscopy or colonoscopy. You may have a sigmoidoscopy every 5 years or a colonoscopy every 10 years starting at age 74.  Prostate cancer screening. Recommendations will vary depending on your family history and other risks.  Hepatitis C blood test.  Hepatitis B blood test.  Sexually transmitted disease (STD) testing.  Diabetes screening. This is done by checking your blood sugar (glucose) after you have not eaten for a while (fasting). You may have this done every 1-3 years.  Abdominal aortic aneurysm (AAA) screening. You may need this if you are a current or former smoker.  Osteoporosis. You may be screened starting at age 44 if you are at high risk. Talk with your health care provider about your test  results, treatment options, and if necessary, the need for more tests. Vaccines  Your health care provider may recommend certain vaccines, such as:  Influenza vaccine. This is recommended every year.  Tetanus, diphtheria, and acellular pertussis (Tdap, Td) vaccine. You may need a Td booster  every 10 years.  Zoster vaccine. You may need this after age 27.  Pneumococcal 13-valent conjugate (PCV13) vaccine. One dose is recommended after age 74.  Pneumococcal polysaccharide (PPSV23) vaccine. One dose is recommended after age 30. Talk to your health care provider about which screenings and vaccines you need and how often you need them. This information is not intended to replace advice given to you by your health care provider. Make sure you discuss any questions you have with your health care provider. Document Released: 10/19/2015 Document Revised: 06/11/2016 Document Reviewed: 07/24/2015 Elsevier Interactive Patient Education  2017 [employer] Prevention in the Home Falls can cause injuries. They can happen to people of all ages. There are many things you can do to make your home safe and to help prevent falls. What can I do on the outside of my home?  Regularly fix the edges of walkways and driveways and fix any cracks.  Remove anything that might make you trip as you walk through a door, such as a raised step or threshold.  Trim any bushes or trees on the path to your home.  Use bright outdoor lighting.  Clear any walking paths of anything that might make someone trip, such as rocks or tools.  Regularly check to see if handrails are loose or broken. Make sure that both sides of any steps have handrails.  Any raised decks and porches should have guardrails on the edges.  Have any leaves, snow, or ice cleared regularly.  Use sand or salt on walking paths during winter.  Clean up any spills in your garage right away. This includes oil or grease spills. What can I do in the bathroom?  Use night lights.  Install grab bars by the toilet and in the tub and shower. Do not use towel bars as grab bars.  Use non-skid mats or decals in the tub or shower.  If you need to sit down in the shower, use a plastic, non-slip stool.  Keep the floor dry. Clean up any  water that spills on the floor as soon as it happens.  Remove soap buildup in the tub or shower regularly.  Attach bath mats securely with double-sided non-slip rug tape.  Do not have throw rugs and other things on the floor that can make you trip. What can I do in the bedroom?  Use night lights.  Make sure that you have a light by your bed that is easy to reach.  Do not use any sheets or blankets that are too big for your bed. They should not hang down onto the floor.  Have a firm chair that has side arms. You can use this for support while you get dressed.  Do not have throw rugs and other things on the floor that can make you trip. What can I do in the kitchen?  Clean up any spills right away.  Avoid walking on wet floors.  Keep items that you use a lot in easy-to-reach places.  If you need to reach something above you, use a strong step stool that has a grab bar.  Keep electrical cords out of the way.  Do not use floor polish or wax that makes  floors slippery. If you must use wax, use non-skid floor wax.  Do not have throw rugs and other things on the floor that can make you trip. What can I do with my stairs?  Do not leave any items on the stairs.  Make sure that there are handrails on both sides of the stairs and use them. Fix handrails that are broken or loose. Make sure that handrails are as long as the stairways.  Check any carpeting to make sure that it is firmly attached to the stairs. Fix any carpet that is loose or worn.  Avoid having throw rugs at the top or bottom of the stairs. If you do have throw rugs, attach them to the floor with carpet tape.  Make sure that you have a light switch at the top of the stairs and the bottom of the stairs. If you do not have them, ask someone to add them for you. What else can I do to help prevent falls?  Wear shoes that:  Do not have high heels.  Have rubber bottoms.  Are comfortable and fit you well.  Are closed  at the toe. Do not wear sandals.  If you use a stepladder:  Make sure that it is fully opened. Do not climb a closed stepladder.  Make sure that both sides of the stepladder are locked into place.  Ask someone to hold it for you, if possible.  Clearly mark and make sure that you can see:  Any grab bars or handrails.  First and last steps.  Where the edge of each step is.  Use tools that help you move around (mobility aids) if they are needed. These include:  Canes.  Walkers.  Scooters.  Crutches.  Turn on the lights when you go into a dark area. Replace any light bulbs as soon as they burn out.  Set up your furniture so you have a clear path. Avoid moving your furniture around.  If any of your floors are uneven, fix them.  If there are any pets around you, be aware of where they are.  Review your medicines with your doctor. Some medicines can make you feel dizzy. This can increase your chance of falling. Ask your doctor what other things that you can do to help prevent falls. This information is not intended to replace advice given to you by your health care provider. Make sure you discuss any questions you have with your health care provider. Document Released: 07/19/2009 Document Revised: 02/28/2016 Document Reviewed: 10/27/2014 Elsevier Interactive Patient Education  2017 [employer].

## 2020-06-07 NOTE — Progress Notes (Signed)
Subjective:   Jimmy Rice is a 74 y.o. male who presents for Medicare Annual/Subsequent preventive examination.  I connected with Jimmy Rice today by telephone and verified that I am speaking with the correct person using two identifiers. Location patient: home Location provider: work Persons participating in the virtual visit: patient, provider.   I discussed the limitations, risks, security and privacy concerns of performing an evaluation and management service by telephone and the availability of in person appointments. I also discussed with the patient that there may be a patient responsible charge related to this service. The patient expressed understanding and verbally consented to this telephonic visit.    Interactive audio and video telecommunications were attempted between this provider and patient, however failed, due to patient having technical difficulties OR patient did not have access to video capability.  We continued and completed visit with audio only.     Review of Systems    N/A Cardiac Risk Factors include: advanced age (>9men, >47 women);male gender;hypertension     Objective:    Today's Vitals   There is no height or weight on file to calculate BMI.  Advanced Directives 06/07/2020 09/15/2019 03/07/2019 02/23/2019 05/19/2018 08/12/2016 07/22/2016  Does Patient Have a Medical Advance Directive? Yes Yes Yes Yes Yes Yes Yes  Type of Paramedic of [place];Living will - - [place];Living will - Living will Living will  Does patient want to make changes to medical advance directive? No - Patient declined - - - - - No - Patient declined  Copy of [place] in Chart? No - copy requested - - - - - No - copy requested    Current Medications (verified) Outpatient Encounter Medications as of 06/07/2020  Medication Sig  . atorvastatin (LIPITOR) 20 MG tablet TAKE 1 TABLET BY MOUTH EVERY DAY  . hydrALAZINE  (APRESOLINE) 25 MG tablet Take 1 tablet (25 mg total) by mouth 3 (three) times daily.  Marland Kitchen losartan-hydrochlorothiazide (HYZAAR) 100-12.5 MG tablet 1 tablet. Take one tablet by mouth once daily  . metoprolol succinate (TOPROL-XL) 50 MG 24 hr tablet TAKE 1 TABLET (50 MG TOTAL) BY MOUTH DAILY. TAKE WITH OR IMMEDIATELY FOLLOWING A MEAL.   No facility-administered encounter medications on file as of 06/07/2020.    Allergies (verified) Amlodipine, Flomax [tamsulosin hcl], and Tamsulosin   History: Past Medical History:  Diagnosis Date  . Arthritis    "right knee" (07/22/2016)  . BENIGN PROSTATIC HYPERTROPHY, HX OF 04/04/2009  . Blood transfusion   . Childhood asthma   . Chronic kidney disease (CKD), stage III (moderate)    cysts on kidneys, 3rd stage kidney disease  . Complication of anesthesia    woke up "fighting"  confusion  . GERD 01/18/2009  . HYPERTENSION 01/18/2009  . HYPOGONADISM 01/18/2009   Past Surgical History:  Procedure Laterality Date  . COLONOSCOPY    . INGUINAL HERNIA REPAIR Right 1997  . NASAL SINUS SURGERY  1997  . PARTIAL KNEE ARTHROPLASTY Right 07/22/2016   Procedure: UNICOMPARTMENTAL KNEE ARTHROPLASTY;  Surgeon: Renette Butters, MD;  Location: [facility];  Service: Orthopedics;  Laterality: Right;  . PROSTATE BIOPSY  X 5  . 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 TRANSURETHRAL RESECTION OF PROSTATE  12/2014   Family History  Problem Relation Age of Onset  . Breast cancer Mother   . Kidney disease Father   . Stomach cancer Paternal Aunt   . Breast cancer Sister   . Bladder  Cancer Brother   . Colon cancer Neg Hx   . Esophageal cancer Neg Hx   . Rectal cancer Neg Hx    Social History   Socioeconomic History  . Marital status: Single    Spouse name: Not on file  . Number of children: Not on file  . Years of education: Not on file  . Highest education level: Not on file  Occupational History  . Not on file  Tobacco Use  . Smoking status:  Former Smoker    Types: Cigarettes  . Smokeless tobacco: Never Used  . Tobacco comment: "quit smoking in the late 1960's; smoked < 1 pack cigarettes TOTAL"  Vaping Use  . Vaping Use: Never used  Substance and Sexual Activity  . Alcohol use: Not Currently    Alcohol/week: 1.0 standard drink    Types: 1 Cans of beer per week    Comment: none  . Drug use: No  . Sexual activity: Yes  Other Topics Concern  . 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 Difficulty of Paying Living Expenses: Not hard at all  Food Insecurity: No Food Insecurity  . Worried About Charity fundraiser in the Last Year: Never true  . Ran Out of Food in the Last Year: Never true  Transportation Needs: No Transportation Needs  . Lack of Transportation (Medical): No  . Lack of Transportation (Non-Medical): No  Physical Activity: Inactive  . Days of Exercise per Week: 0 days  . Minutes of Exercise per Session: 0 min  Stress: No Stress Concern Present  . Feeling of Stress : Not at all  Social Connections: Moderately Integrated  . Frequency of Communication with Friends and Family: More than three times a week  . Frequency of Social Gatherings with Friends and Family: More than three times a week  . Attends Religious Services: Never  . Active Member of Clubs or Organizations: Yes  . Attends Archivist Meetings: More than 4 times per year  . Marital Status: Married    Tobacco Counseling Counseling given: Not Answered Comment: "quit smoking in the late 1960's; smoked < 1 pack cigarettes TOTAL"   Clinical Intake:  Pre-visit preparation completed: Yes  Pain : No/denies pain     Nutritional Risks: None Diabetes: No  How often do you need to have someone help you when you read instructions, pamphlets, or other written materials from your doctor  or pharmacy?: 1 - Never What is the last grade level you completed in school?: Graduate School  Diabetic?No  Interpreter Needed?: No  Information entered by :: [place] of Daily Living In your present state of health, do you have any difficulty performing the following activities: 06/07/2020  Hearing? Y  Comment has some issues with hearing at times  Vision? N  Difficulty concentrating or making decisions? Y  Comment Has some issues with forgetting  Walking or climbing stairs? N  Dressing or bathing? N  Doing errands, shopping? N  Preparing Food and eating ? N  Using the Toilet? N  In the past six months, have you accidently leaked urine? Y  Comment Does have a bladder leakage  Do you have problems with loss of bowel control? N  Managing your Medications? N  Managing your Finances? N  Housekeeping or managing your  Housekeeping? N  Some recent data might be hidden    Patient Care Team: Eulas Post, MD as PCP - General Wellington Hampshire, MD as PCP - Cardiology (Cardiology) Alda Berthold, DO as Consulting Physician (Neurology)  Indicate any recent Medical Services you may have received from other than [facility] providers in the past year (date may be approximate).     Assessment:   This is a routine wellness examination for [place].  Hearing/Vision screen  Hearing Screening   125Hz  250Hz  500Hz  1000Hz  2000Hz  3000Hz  4000Hz  6000Hz  8000Hz   Right ear:           Left ear:           Vision Screening Comments: Patient states has not had eyes checked in 3 years  Dietary issues and exercise activities discussed: Current Exercise Habits: The patient does not participate in regular exercise at present  Goals    . Patient Stated     Wants to get back to the gym Early am or after work or weekends - stopped x 6 months  Start back swimming   Moving back to [place]; still has a place in [place]  When settled in home in [place], you will go back to the  gym  Continue to play golf everyday!    . Patient Stated     I would like to exercise more       Depression Screen PHQ 2/9 Scores 06/07/2020 11/09/2019 05/19/2018 05/19/2018 03/31/2017 01/14/2016 11/02/2015  PHQ - 2 Score 0 0 0 0 0 0 0  PHQ- 9 Score 0 0 - - - - -    Fall Risk Fall Risk  06/07/2020 09/15/2019 03/07/2019 05/19/2018 05/19/2018  Falls in the past year? 0 0 0 No No  Number falls in past yr: 0 0 - - -  Injury with Fall? 0 0 - - -  Risk for fall due to : Medication side effect - - - -  Follow up Falls evaluation completed;Falls prevention discussed - - - -    Any stairs in or around the home? No  If so, are there any without handrails? No  Home free of loose throw rugs in walkways, pet beds, electrical cords, etc? Yes  Adequate lighting in your home to reduce risk of falls? Yes   ASSISTIVE DEVICES UTILIZED TO PREVENT FALLS:  Life alert? No  Use of a cane, walker or w/c? No  Grab bars in the bathroom? No  Shower chair or bench in shower? No  Elevated toilet seat or a handicapped toilet? No     Cognitive Function: MMSE - Mini Mental State Exam 05/19/2018  Not completed: (No Data)     6CIT Screen 06/07/2020  What Year? 0 points  What month? 0 points  What time? 0 points  Count back from 20 0 points  Months in reverse 0 points  Repeat phrase 2 points  Total Score 2    Immunizations Immunization History  Administered Date(s) Administered  . Fluad Quad(high Dose 65+) 06/07/2019  . Influenza Split 06/17/2018  . Influenza, High Dose Seasonal PF 06/17/2018  . Influenza,inj,Quad PF,6+ Mos 06/17/2013  . Influenza-Unspecified 07/07/2015, 07/08/2016, 07/06/2017  . Moderna SARS-COVID-2 Vaccination 11/27/2019, 12/28/2019  . Pneumococcal Conjugate-13 08/14/2014  . Pneumococcal Polysaccharide-23 02/10/2011  . Td 10/06/2005  . Zoster 05/12/2012    TDAP status: Due, Education has been provided regarding the importance of this vaccine. Advised may receive this vaccine at local  pharmacy or Health Dept. Aware to provide  a copy of the vaccination record if obtained from local pharmacy or Health Dept. Verbalized acceptance and understanding. Flu Vaccine status: Up to date Pneumococcal vaccine status: Up to date Covid-19 vaccine status: Completed vaccines  Qualifies for Shingles Vaccine? Yes   Zostavax completed Yes   Shingrix Completed?: No.    Education has been provided regarding the importance of this vaccine. Patient has been advised to call insurance company to determine out of pocket expense if they have not yet received this vaccine. Advised may also receive vaccine at local pharmacy or Health Dept. Verbalized acceptance and understanding.  Screening Tests Health Maintenance  Topic Date Due  . TETANUS/TDAP  10/07/2015  . INFLUENZA VACCINE  05/06/2020  . COLONOSCOPY  06/24/2022  . COVID-19 Vaccine  Completed  . Hepatitis C Screening  Completed  . PNA vac Low Risk Adult  Completed    Health Maintenance  Health Maintenance Due  Topic Date Due  . TETANUS/TDAP  10/07/2015  . INFLUENZA VACCINE  05/06/2020    Colorectal cancer screening: Completed 06/24/2012. Repeat every 10 years  Lung Cancer Screening: (Low Dose CT Chest recommended if Age 14-80 years, 30 pack-year currently smoking OR have quit w/in 15years.) does not qualify.   Lung Cancer Screening Referral: N/A  Additional Screening:  Hepatitis C Screening: does qualify; Completed 11/03/2016  Vision Screening: Recommended annual ophthalmology exams for early detection of glaucoma and other disorders of the eye. Is the patient up to date with their annual eye exam?  No  Who is the provider or what is the name of the office in which the patient attends annual eye exams? Does not have a current eye doctor  If pt is not established with a provider, would they like to be referred to a provider to establish care? Yes .   Dental Screening: Recommended annual dental exams for proper oral  hygiene  Community Resource Referral / Chronic Care Management: CRR required this visit?  No   CCM required this visit?  No      Plan:     I have personally reviewed and noted the following in the patient's chart:   . Medical and social history . Use of alcohol, tobacco or illicit drugs  . Current medications and supplements . Functional ability and status . Nutritional status . Physical activity . Advanced directives . List of other physicians . Hospitalizations, surgeries, and ER visits in previous 12 months . Vitals . Screenings to include cognitive, depression, and falls . Referrals and appointments  In addition, I have reviewed and discussed with patient certain preventive protocols, quality metrics, and best practice recommendations. A written personalized care plan for preventive services as well as general preventive health recommendations were provided to patient.     Ofilia Neas, LPN   12/10/1060   Nurse Notes: Patient has requested a referral to audiology for hearing loss and referral for eye exam. Referrals orders placed.

## 2020-06-12 ENCOUNTER — Ambulatory Visit: Payer: Medicare HMO | Admitting: Audiologist

## 2020-06-15 ENCOUNTER — Ambulatory Visit: Payer: Medicare HMO | Attending: Audiologist | Admitting: Audiologist

## 2020-06-15 ENCOUNTER — Other Ambulatory Visit: Payer: Self-pay

## 2020-06-15 DIAGNOSIS — H903 Sensorineural hearing loss, bilateral: Secondary | ICD-10-CM | POA: Diagnosis present

## 2020-06-15 NOTE — Procedures (Signed)
Outpatient Audiology and [facility] [place], [place]  [phone]  AUDIOLOGICAL  EVALUATION  NAME: Jimmy Rice     DOB:   1945-12-24      MRN: [id number]                                                                                     DATE: 06/15/2020     REFERENT: Eulas Post, MD STATUS: Outpatient DIAGNOSIS: Sensory hearing loss, bilateral  History: Jimmy Rice was seen for an audiological evaluation. Jimmy Rice is receiving a hearing evaluation due to concerns for hearing loss in both ears. Jimmy Rice has difficulty hearing over the phone and is asking people to repeat themselves more often. This difficulty began gradually. No pain or pressure reported in either ear. No tinnitus in either ear. Jimmy Rice has a history of noise exposure from serving in the [employer].  Medical history positive for CKD stage 3 which is a risk factor for hearing loss. No other relevant case history reported.    Evaluation:   Otoscopy showed a clear view of the tympanic membrane in the left ear and significant cerumen buildup in the right ear with no visual of the tympanic membrane  Tympanometry results were consistent with normal middle ear function, bilaterally    Audiometric testing was completed using conventional audiometry with supraural transducer. Speech Recognition Thresholds were consistent with pure tone averages. Word Recognition was excellent at conversation level. Pure tone thresholds show normal hearing in low pitches with a mild sensorineural hearing loss in both ears at 3k and 8k Hz only. Test results are consistent with mild sensorineural hearing loss.   Results:  The test results were reviewed with [name]. He only has a mild loss in both ears. At this time the amount of hearing loss does not warrant amplification. However his hearing needs to be monitored as kidney disease can increase the rate of hearing decline. He also has a history of  noise exposure causing a mild noise notch in his hearing at 3k Hz. He needs to protect his hearing from loud noise and be seen for audiometric testing annually.   At this time hearing aids are not recommended. Jimmy Rice is missing the highest pitch speech sounds, /s/ /f/ /th/. These sounds are visible in the lips, so he can fill in the blanks when he misses a sound if he is face to face and within 5 feet of his communication partner. However due to masks he can no longer lip read, making it harder for him to fill in the missing speech sounds. This is also why he has more difficulty on the phone.  Jimmy Rice uses Qtips to clean his ears, this can cause cerumen impaction. For the significant wax build up in the right ear [name] was advised to use Debrox drops. See below for how to use these drops.    Jimmy Rice reported understanding all that was covered today.    Recommendations: 1. Due to medical risk factor for hearing loss annual audiometric testing is recommended.  Debrox Earwax Removal Drops are a safe and inexpensive in-home solution for wax removal. Debrox Earwax Removal Kit includes a soft  rubber bulb syringe to rinse your ear after using Debrox Earwax Removal Drops. Excessive earwax build-up can lead to ear discomfort and reduced hearing, which can affect your day-to-day life. The kit can be purchased over the counter at [facility], [facility], [facility], and most other pharmacies.   How to use the Debrox Earwax Removal Drops Kit:   tilt head sideways.  place 5 to 10 drops into ear.  tip of applicator should not enter ear canal.  keep drops in ear for several minutes by keeping head tilted or placing cotton in the ear.  use twice daily for up to four days   gently flush ear with water, using soft rubber bulb syringe after final treatment (on 4th day)     Jimmy Rice  Audiologist, Au.D., CCC-A 06/15/2020  8:45 AM  Cc: Eulas Post, MD

## 2020-06-22 DIAGNOSIS — C61 Malignant neoplasm of prostate: Secondary | ICD-10-CM | POA: Diagnosis not present

## 2020-06-23 DIAGNOSIS — R69 Illness, unspecified: Secondary | ICD-10-CM | POA: Diagnosis not present

## 2020-07-10 ENCOUNTER — Encounter: Payer: Self-pay | Admitting: Family Medicine

## 2020-07-10 ENCOUNTER — Ambulatory Visit (INDEPENDENT_AMBULATORY_CARE_PROVIDER_SITE_OTHER): Payer: Medicare HMO | Admitting: Family Medicine

## 2020-07-10 ENCOUNTER — Other Ambulatory Visit: Payer: Self-pay

## 2020-07-10 VITALS — BP 130/72 | HR 86 | Temp 98.3°F | Ht 72.0 in | Wt 206.4 lb

## 2020-07-10 DIAGNOSIS — K644 Residual hemorrhoidal skin tags: Secondary | ICD-10-CM | POA: Diagnosis not present

## 2020-07-10 NOTE — Patient Instructions (Addendum)

## 2020-07-10 NOTE — Progress Notes (Signed)
Established Patient Office Visit  Subjective:  Patient ID: Jimmy Rice, male    DOB: Feb 26, 1946  Age: 74 y.o. MRN: [id number]  CC: No chief complaint on file.   HPI Jimmy Rice presents for concern for some recent "hemorrhoids ".  He states around September 1 he noticed some swelling and itching in the anal area.  He noticed some mild discomfort.  No bleeding.  He used some Preparation H and thinks it may have helped some.  He denies any recent constipation or straining.  Last colonoscopy was 2013.  He denies any prior history of hemorrhoids.  He brings in copy of labs from the [place].  He had recent creatinine 1.8 which is stable.  PSA 9.3.  Followed by urologist in [place].  He does have prostate cancer but this is confined to the capsule of the prostate .  They are electing to observe for now  Past Medical History:  Diagnosis Date  . Arthritis    "right knee" (07/22/2016)  . BENIGN PROSTATIC HYPERTROPHY, HX OF 04/04/2009  . Blood transfusion   . Childhood asthma   . Chronic kidney disease (CKD), stage III (moderate) (HCC)    cysts on kidneys, 3rd stage kidney disease  . Complication of anesthesia    woke up "fighting"  confusion  . GERD 01/18/2009  . HYPERTENSION 01/18/2009  . HYPOGONADISM 01/18/2009    Past Surgical History:  Procedure Laterality Date  . COLONOSCOPY    . INGUINAL HERNIA REPAIR Right 1997  . NASAL SINUS SURGERY  1997  . PARTIAL KNEE ARTHROPLASTY Right 07/22/2016   Procedure: UNICOMPARTMENTAL KNEE ARTHROPLASTY;  Surgeon: Renette Butters, MD;  Location: [facility];  Service: Orthopedics;  Laterality: Right;  . PROSTATE BIOPSY  X 5  . 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 TRANSURETHRAL RESECTION OF PROSTATE  12/2014    Family History  Problem Relation Age of Onset  . Breast cancer Mother   . Kidney disease Father   . Stomach cancer Paternal Aunt   . Breast cancer Sister   . Bladder Cancer Brother   . Colon cancer Neg Hx   .  Esophageal cancer Neg Hx   . Rectal cancer Neg Hx     Social History   Socioeconomic History  . Marital status: Single    Spouse name: Not on file  . Number of children: Not on file  . Years of education: Not on file  . Highest education level: Not on file  Occupational History  . Not on file  Tobacco Use  . Smoking status: Former Smoker    Types: Cigarettes  . Smokeless tobacco: Never Used  . Tobacco comment: "quit smoking in the late 1960's; smoked < 1 pack cigarettes TOTAL"  Vaping Use  . Vaping Use: Never used  Substance and Sexual Activity  . Alcohol use: Not Currently    Alcohol/week: 1.0 standard drink    Types: 1 Cans of beer per week    Comment: none  . Drug use: No  . Sexual activity: Yes  Other Topics Concern  . 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 Difficulty of Paying Living Expenses: Not hard at all  Food Insecurity: No Food Insecurity  . Worried About Charity fundraiser in the Last Year: Never true  .  Ran Out of Food in the Last Year: Never true  Transportation Needs: No Transportation Needs  . Lack of Transportation (Medical): No  . Lack of Transportation (Non-Medical): No  Physical Activity: Inactive  . Days of Exercise per Week: 0 days  . Minutes of Exercise per Session: 0 min  Stress: No Stress Concern Present  . Feeling of Stress : Not at all  Social Connections: Moderately Integrated  . Frequency of Communication with Friends and Family: More than three times a week  . Frequency of Social Gatherings with Friends and Family: More than three times a week  . Attends Religious Services: Never  . Active Member of Clubs or Organizations: Yes  . Attends Archivist Meetings: More than 4 times per year  . Marital Status: Married  Human resources officer Violence: Not At Risk  . Fear of Current  or Ex-Partner: No  . Emotionally Abused: No  . Physically Abused: No  . Sexually Abused: No    Outpatient Medications Prior to Visit  Medication Sig Dispense Refill  . atorvastatin (LIPITOR) 20 MG tablet TAKE 1 TABLET BY MOUTH EVERY DAY 90 tablet 1  . hydrALAZINE (APRESOLINE) 25 MG tablet Take 1 tablet (25 mg total) by mouth 3 (three) times daily. 270 tablet 3  . losartan-hydrochlorothiazide (HYZAAR) 100-12.5 MG tablet 1 tablet. Take one tablet by mouth once daily    . metoprolol succinate (TOPROL-XL) 50 MG 24 hr tablet TAKE 1 TABLET (50 MG TOTAL) BY MOUTH DAILY. TAKE WITH OR IMMEDIATELY FOLLOWING A MEAL. 90 tablet 3   No facility-administered medications prior to visit.    Allergies  Allergen Reactions  . Amlodipine Hives  . Flomax [Tamsulosin Hcl] Other (See Comments)    DIFFICULTY BREATHING NASAL STUFFINESS  . Tamsulosin Other (See Comments)    DIFFICULTY BREATHING NASAL STUFFINESS DIFFICULTY BREATHING NASAL STUFFINESS    ROS Review of Systems  Constitutional: Negative for appetite change, chills, fever and unexpected weight change.  Gastrointestinal: Negative for abdominal pain, anal bleeding, blood in stool, constipation, diarrhea and rectal pain.      Objective:    Physical Exam Vitals reviewed.  Constitutional:      Appearance: Normal appearance.  Cardiovascular:     Rate and Rhythm: Normal rate and regular rhythm.  Pulmonary:     Effort: Pulmonary effort is normal.     Breath sounds: Normal breath sounds.  Genitourinary:    Comments: He has some external skin tags.  No thrombosis.  No visible anal fissure.  Digital exam reveals no rectal mass. Neurological:     Mental Status: He is alert.     BP 130/72 (BP Location: Left Arm, Patient Position: Sitting, Cuff Size: Large)   Pulse 86   Temp 98.3 F (36.8 C) (Oral)   Ht 6' (1.829 m)   Wt 206 lb 6 oz (93.6 kg)   SpO2 96%   BMI 27.99 kg/m  Wt Readings from Last 3 Encounters:  07/10/20 206 lb 6 oz  (93.6 kg)  02/21/20 204 lb 6 oz (92.7 kg)  01/17/20 203 lb 9.6 oz (92.4 kg)     Health Maintenance Due  Topic Date Due  . TETANUS/TDAP  10/07/2015    There are no preventive care reminders to display for this patient.  Lab Results  Component Value Date   TSH 1.99 06/04/2020   Lab Results  Component Value Date   WBC 4.1 06/04/2020   HGB 13.8 06/04/2020   HCT 41 06/04/2020   MCV 90.7 02/23/2019  PLT 185 06/04/2020   Lab Results  Component Value Date   NA 137 06/04/2020   K 3.7 06/04/2020   CO2 27 (A) 06/04/2020   GLUCOSE 133 (H) 01/03/2020   BUN 22 (A) 06/04/2020   CREATININE 1.9 (A) 06/04/2020   BILITOT 0.9 04/29/2019   ALKPHOS 76 06/04/2020   AST 18 06/04/2020   ALT 59 (A) 06/04/2020   PROT 7.3 04/29/2019   ALBUMIN 4.2 06/04/2020   CALCIUM 9.4 06/04/2020   ANIONGAP 11 02/23/2019   GFR 47.56 (L) 01/03/2020   Lab Results  Component Value Date   CHOL 139 04/29/2019   Lab Results  Component Value Date   HDL 55 06/04/2020   Lab Results  Component Value Date   LDLCALC 80 06/04/2020   Lab Results  Component Value Date   TRIG 86 06/04/2020   Lab Results  Component Value Date   CHOLHDL 3 04/29/2019   Lab Results  Component Value Date   HGBA1C 5.7 06/04/2020      Assessment & Plan:   External hemorrhoids.  Minimally symptomatic at this time.  -Consider Anusol HC cream as needed for symptoms -Recommend daily warm sitz bath as -Discussed measures to reduce constipation and straining.  He has no major constipation issues now  No orders of the defined types were placed in this encounter.   Follow-up: No follow-ups on file.    Carolann Littler, MD

## 2020-08-03 ENCOUNTER — Other Ambulatory Visit: Payer: Self-pay | Admitting: Family Medicine

## 2020-08-07 ENCOUNTER — Ambulatory Visit: Payer: Medicare HMO | Admitting: Internal Medicine

## 2020-08-28 ENCOUNTER — Ambulatory Visit: Payer: Medicare HMO | Admitting: Cardiovascular Disease

## 2020-08-28 ENCOUNTER — Encounter: Payer: Self-pay | Admitting: Cardiovascular Disease

## 2020-08-28 ENCOUNTER — Other Ambulatory Visit: Payer: Self-pay

## 2020-08-28 VITALS — BP 122/76 | HR 69 | Ht 72.0 in | Wt 206.0 lb

## 2020-08-28 DIAGNOSIS — I10 Essential (primary) hypertension: Secondary | ICD-10-CM

## 2020-08-28 DIAGNOSIS — I493 Ventricular premature depolarization: Secondary | ICD-10-CM

## 2020-08-28 DIAGNOSIS — E785 Hyperlipidemia, unspecified: Secondary | ICD-10-CM | POA: Diagnosis not present

## 2020-08-28 NOTE — Patient Instructions (Signed)
Medication Instructions:  Your physician recommends that you continue on your current medications as directed. Please refer to the Current Medication list given to you today.  *If you need a refill on your cardiac medications before your next appointment, please call your pharmacy*   Lab Work: None ordered If you have labs (blood work) drawn today and your tests are completely normal, you will receive your results only by: . MyChart Message (if you have MyChart) OR . A paper copy in the mail If you have any lab test that is abnormal or we need to change your treatment, we will call you to review the results.   Testing/Procedures: None ordered   Follow-Up: At [facility], you and your health needs are our priority.  As part of our continuing mission to provide you with exceptional heart care, we have created designated Provider Care Teams.  These Care Teams include your primary Cardiologist (physician) and Advanced Practice Providers (APPs -  Physician Assistants and Nurse Practitioners) who all work together to provide you with the care you need, when you need it.  We recommend signing up for the patient portal called "MyChart".  Sign up information is provided on this After Visit Summary.  MyChart is used to connect with patients for Virtual Visits (Telemedicine).  Patients are able to view lab/test results, encounter notes, upcoming appointments, etc.  Non-urgent messages can be sent to your provider as well.   To learn more about what you can do with MyChart, go to [email or web address].    Your next appointment:   12 month(s)  The format for your next appointment:   In Person  Provider:   You may see Muhammad Arida, MD or one of the following Advanced Practice Providers on your designated Care Team:    Christopher Berge, NP  Ryan Dunn, PA-C  Jacquelyn Visser, PA-C  Cadence Furth, PA-C  Caitlin Walker, NP    Other Instructions N/A  

## 2020-08-28 NOTE — Progress Notes (Signed)
Cardiology Office Note   Date:  08/28/2020   ID:  Jimmy Rice, DOB 1946/09/25, MRN [id number]  PCP:  Eulas Post, MD  Cardiologist:   Kathlyn Sacramento, MD   Chief Complaint  Patient presents with  . Follow-up    6 months  Pt states no new Sx.      History of Present Illness: Jimmy Rice is a 74 y.o. male who is here today for follow-up visit regarding symptomatic PVCs.  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 Previous cardiac work-up including stress testing and echocardiogram were unremarkable.  PVCs are being treated with Toprol with excellent control.  He has been doing well with no recent chest pain, shortness of breath or palpitations.  He takes his medications regularly.  He has chronic kidney disease but his creatinine has been stable most recently was 1.89.   Past Medical History:  Diagnosis Date  . Arthritis    "right knee" (07/22/2016)  . BENIGN PROSTATIC HYPERTROPHY, HX OF 04/04/2009  . Blood transfusion   . Childhood asthma   . Chronic kidney disease (CKD), stage III (moderate) (HCC)    cysts on kidneys, 3rd stage kidney disease  . Complication of anesthesia    woke up "fighting"  confusion  . GERD 01/18/2009  . HYPERTENSION 01/18/2009  . HYPOGONADISM 01/18/2009    Past Surgical History:  Procedure Laterality Date  . COLONOSCOPY    . INGUINAL HERNIA REPAIR Right 1997  . NASAL SINUS SURGERY  1997  . PARTIAL KNEE ARTHROPLASTY Right 07/22/2016   Procedure: UNICOMPARTMENTAL KNEE ARTHROPLASTY;  Surgeon: Renette Butters, MD;  Location: [facility];  Service: Orthopedics;  Laterality: Right;  . PROSTATE BIOPSY  X 5  . 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 TRANSURETHRAL RESECTION OF PROSTATE  12/2014     Current Outpatient Medications  Medication Sig Dispense Refill  . atorvastatin (LIPITOR) 20 MG tablet TAKE 1  TABLET BY MOUTH EVERY DAY 90 tablet 1  . hydrALAZINE (APRESOLINE) 25 MG tablet Take 1 tablet (25 mg total) by mouth 3 (three) times daily. 270 tablet 3  . losartan-hydrochlorothiazide (HYZAAR) 100-12.5 MG tablet 1 tablet. Take one tablet by mouth once daily    . metoprolol succinate (TOPROL-XL) 50 MG 24 hr tablet TAKE 1 TABLET (50 MG TOTAL) BY MOUTH DAILY. TAKE WITH OR IMMEDIATELY FOLLOWING A MEAL. 90 tablet 3   No current facility-administered medications for this visit.    Allergies:   Amlodipine, Flomax [tamsulosin hcl], and Tamsulosin    Social History:  The patient  reports that he has quit smoking. His smoking use included cigarettes. He has never used smokeless tobacco. He reports previous alcohol use of about 1.0 standard drink of alcohol per week. He reports that he does not use drugs.   Family History:  The patient's family history includes Bladder Cancer in his brother; Breast cancer in his mother and sister; Kidney disease in his father; Stomach cancer in his paternal aunt.    ROS:  Please see the history of present illness.   Otherwise, review of systems are positive for none.   All other systems are reviewed and negative.    PHYSICAL EXAM: VS:  BP 122/76   Pulse 69   Ht 6' (1.829 m)   Wt 206 lb (93.4 kg)   BMI 27.94 kg/m  , BMI Body mass index is 27.94 kg/m. GEN: Well  nourished, well developed, in no acute distress  HEENT: normal  Neck: no JVD, carotid bruits, or masses Cardiac: RRR; no murmurs, rubs, or gallops,no edema  Respiratory:  clear to auscultation bilaterally, normal work of breathing GI: soft, nontender, nondistended, + BS MS: no deformity or atrophy  Skin: warm and dry, no rash Neuro:  Strength and sensation are intact Psych: euthymic mood, full affect   EKG:  EKG is ordered today. The ekg ordered today demonstrates normal sinus rhythm with no significant ST or T wave changes.  No PVCs.   Recent Labs: 06/04/2020: ALT 59; BUN 22; Creatinine 1.9;  Hemoglobin 13.8; Platelets 185; Potassium 3.7; Sodium 137; TSH 1.99    Lipid Panel    Component Value Date/Time   CHOL 139 04/29/2019 1018   TRIG 86 06/04/2020 0000   HDL 55 06/04/2020 0000   CHOLHDL 3 04/29/2019 1018   VLDL 15.2 04/29/2019 1018   LDLCALC 80 06/04/2020 0000   LDLDIRECT 106.0 05/19/2018 1510      Wt Readings from Last 3 Encounters:  08/28/20 206 lb (93.4 kg)  07/10/20 206 lb 6 oz (93.6 kg)  02/21/20 204 lb 6 oz (92.7 kg)     PAD Screen 07/14/2019  Previous PAD dx? No  Previous surgical procedure? No  Pain with walking? No  Feet/toe relief with dangling? No  Painful, non-healing ulcers? No  Extremities discolored? No      ASSESSMENT AND PLAN:  1.  Symptomatic PVCs: Symptoms are  well controlled on current dose of Toprol.   2.  Essential hypertension: Blood pressure is reasonably controlled on current medications.  3.  Hyperlipidemia: Currently on atorvastatin 20 mg daily.  Most recent lipid profile showed an LDL of 76.  4.  Chronic kidney disease: Followed by nephrology and has been stable.    Disposition:   FU with me in 12 months  Signed,  Kathlyn Sacramento, MD  08/28/2020 5:08 PM    [name]

## 2020-10-12 ENCOUNTER — Other Ambulatory Visit: Payer: Self-pay

## 2020-10-15 ENCOUNTER — Ambulatory Visit (INDEPENDENT_AMBULATORY_CARE_PROVIDER_SITE_OTHER): Payer: Medicare Other | Admitting: Family Medicine

## 2020-10-15 ENCOUNTER — Encounter: Payer: Self-pay | Admitting: Family Medicine

## 2020-10-15 ENCOUNTER — Other Ambulatory Visit: Payer: Self-pay

## 2020-10-15 VITALS — BP 138/78 | HR 74 | Ht 72.0 in | Wt 207.0 lb

## 2020-10-15 DIAGNOSIS — G5621 Lesion of ulnar nerve, right upper limb: Secondary | ICD-10-CM

## 2020-10-15 DIAGNOSIS — N183 Chronic kidney disease, stage 3 unspecified: Secondary | ICD-10-CM | POA: Diagnosis not present

## 2020-10-15 DIAGNOSIS — I10 Essential (primary) hypertension: Secondary | ICD-10-CM

## 2020-10-15 DIAGNOSIS — C61 Malignant neoplasm of prostate: Secondary | ICD-10-CM | POA: Diagnosis not present

## 2020-10-15 NOTE — Progress Notes (Signed)
Established Patient Office Visit  Subjective:  Patient ID: Jimmy Rice, male    DOB: November 07, 1945  Age: 75 y.o. MRN: [id number]  CC:  Chief Complaint  Patient presents with  . Annual Exam    HPI Jimmy Rice presents for medical follow-up. He has history of prostate cancer. He was seen at [facility] and had negative biopsies back in 2015 2016. 2018 he had positive biopsy and has been followed expectantly since then. Gleason score was 3+3  (grade group I).   He is more recently been followed by urologist in [place]. He had recent labs done in November with PSA total of 8.0 with percent free PSA of 32.9%. He does have some nocturia and occasionally slow stream. Prior intolerance with Flomax. Apparently had rash. He apparently has not been tried on Avodart, finasteride, Cardura, Hytrin, or Cialis. He specifically would like to get referral to urologist closer to home in [place]  He has hypertension currently treated with losartan HCTZ, metoprolol, and hydralazine. Compliant with medications. Blood pressure stable. Does have chronic kidney disease stage III. He has had recent labs.  He has had some ongoing intermittent issues with left ulnar neuropathy. Occasional sharp pains right upper back. He has intermittent numbness and tingling of the right fifth digit and a half of the fourth. He had nerve conduction velocities back in 2019 which confirmed moderate left ulnar nerve involvement. No pain around the elbow region.  Past Medical History:  Diagnosis Date  . Arthritis    "right knee" (07/22/2016)  . BENIGN PROSTATIC HYPERTROPHY, HX OF 04/04/2009  . Blood transfusion   . Childhood asthma   . Chronic kidney disease (CKD), stage III (moderate) (HCC)    cysts on kidneys, 3rd stage kidney disease  . Complication of anesthesia    woke up "fighting"  confusion  . GERD 01/18/2009  . HYPERTENSION 01/18/2009  . HYPOGONADISM 01/18/2009    Past Surgical History:  Procedure Laterality  Date  . COLONOSCOPY    . INGUINAL HERNIA REPAIR Right 1997  . NASAL SINUS SURGERY  1997  . PARTIAL KNEE ARTHROPLASTY Right 07/22/2016   Procedure: UNICOMPARTMENTAL KNEE ARTHROPLASTY;  Surgeon: Renette Butters, MD;  Location: [facility];  Service: Orthopedics;  Laterality: Right;  . PROSTATE BIOPSY  X 5  . 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 TRANSURETHRAL RESECTION OF PROSTATE  12/2014    Family History  Problem Relation Age of Onset  . Breast cancer Mother   . Kidney disease Father   . Stomach cancer Paternal Aunt   . Breast cancer Sister   . Bladder Cancer Brother   . Colon cancer Neg Hx   . Esophageal cancer Neg Hx   . Rectal cancer Neg Hx     Social History   Socioeconomic History  . Marital status: Single    Spouse name: Not on file  . Number of children: Not on file  . Years of education: Not on file  . Highest education level: Not on file  Occupational History  . Not on file  Tobacco Use  . Smoking status: Former Smoker    Types: Cigarettes  . Smokeless tobacco: Never Used  . Tobacco comment: "quit smoking in the late 1960's; smoked < 1 pack cigarettes TOTAL"  Vaping Use  . Vaping Use: Never used  Substance and Sexual Activity  . Alcohol use: Not Currently    Alcohol/week: 1.0 standard drink    Types: 1 Cans of beer per week  Comment: none  . Drug use: No  . Sexual activity: Yes  Other Topics Concern  . 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 Difficulty of Paying Living Expenses: Not hard at all  Food Insecurity: No Food Insecurity  . Worried About Charity fundraiser in the Last Year: Never true  . Ran Out of Food in the Last Year: Never true  Transportation Needs: No Transportation Needs  . Lack of Transportation (Medical): No  . Lack of Transportation (Non-Medical): No  Physical  Activity: Inactive  . Days of Exercise per Week: 0 days  . Minutes of Exercise per Session: 0 min  Stress: No Stress Concern Present  . Feeling of Stress : Not at all  Social Connections: Moderately Integrated  . Frequency of Communication with Friends and Family: More than three times a week  . Frequency of Social Gatherings with Friends and Family: More than three times a week  . Attends Religious Services: Never  . Active Member of Clubs or Organizations: Yes  . Attends Archivist Meetings: More than 4 times per year  . Marital Status: Married  Human resources officer Violence: Not At Risk  . Fear of Current or Ex-Partner: No  . Emotionally Abused: No  . Physically Abused: No  . Sexually Abused: No    Outpatient Medications Prior to Visit  Medication Sig Dispense Refill  . atorvastatin (LIPITOR) 20 MG tablet TAKE 1 TABLET BY MOUTH EVERY DAY 90 tablet 1  . hydrALAZINE (APRESOLINE) 25 MG tablet Take 1 tablet (25 mg total) by mouth 3 (three) times daily. 270 tablet 3  . losartan-hydrochlorothiazide (HYZAAR) 100-12.5 MG tablet 1 tablet. Take one tablet by mouth once daily    . metoprolol succinate (TOPROL-XL) 50 MG 24 hr tablet TAKE 1 TABLET (50 MG TOTAL) BY MOUTH DAILY. TAKE WITH OR IMMEDIATELY FOLLOWING A MEAL. 90 tablet 3   No facility-administered medications prior to visit.    Allergies  Allergen Reactions  . Amlodipine Hives  . Aspirin Hives  . Flomax [Tamsulosin Hcl] Other (See Comments)    DIFFICULTY BREATHING NASAL STUFFINESS  . Tamsulosin Other (See Comments)    DIFFICULTY BREATHING NASAL STUFFINESS DIFFICULTY BREATHING NASAL STUFFINESS    ROS Review of Systems  Constitutional: Negative for appetite change, fatigue and unexpected weight change.  Eyes: Negative for visual disturbance.  Respiratory: Negative for cough, chest tightness and shortness of breath.   Cardiovascular: Negative for chest pain, palpitations and leg swelling.  Genitourinary: Positive  for difficulty urinating. Negative for hematuria.  Neurological: Negative for dizziness, syncope, weakness, light-headedness and headaches.  Hematological: Negative for adenopathy.  Psychiatric/Behavioral: Negative for confusion.      Objective:    Physical Exam Vitals reviewed.  Constitutional:      Appearance: Normal appearance.  Cardiovascular:     Rate and Rhythm: Normal rate and regular rhythm.  Pulmonary:     Effort: Pulmonary effort is normal.     Breath sounds: Normal breath sounds.  Musculoskeletal:     Right lower leg: No edema.     Left lower leg: No edema.  Neurological:     General: No focal deficit present.     Mental Status: He is alert.  Psychiatric:        Mood and Affect: Mood normal.  Thought Content: Thought content normal.     BP 138/78   Pulse 74   Ht 6' (1.829 m)   Wt 207 lb (93.9 kg)   SpO2 96%   BMI 28.07 kg/m  Wt Readings from Last 3 Encounters:  10/15/20 207 lb (93.9 kg)  08/28/20 206 lb (93.4 kg)  07/10/20 206 lb 6 oz (93.6 kg)     Health Maintenance Due  Topic Date Due  . TETANUS/TDAP  10/07/2015  . COVID-19 Vaccine (3 - Moderna risk 4-dose series) 01/25/2020    There are no preventive care reminders to display for this patient.  Lab Results  Component Value Date   TSH 1.99 06/04/2020   Lab Results  Component Value Date   WBC 4.1 06/04/2020   HGB 13.8 06/04/2020   HCT 41 06/04/2020   MCV 90.7 02/23/2019   PLT 185 06/04/2020   Lab Results  Component Value Date   NA 137 06/04/2020   K 3.7 06/04/2020   CO2 27 (A) 06/04/2020   GLUCOSE 133 (H) 01/03/2020   BUN 22 (A) 06/04/2020   CREATININE 1.9 (A) 06/04/2020   BILITOT 0.9 04/29/2019   ALKPHOS 76 06/04/2020   AST 18 06/04/2020   ALT 59 (A) 06/04/2020   PROT 7.3 04/29/2019   ALBUMIN 4.2 06/04/2020   CALCIUM 9.4 06/04/2020   ANIONGAP 11 02/23/2019   GFR 47.56 (L) 01/03/2020   Lab Results  Component Value Date   CHOL 139 04/29/2019   Lab Results   Component Value Date   HDL 55 06/04/2020   Lab Results  Component Value Date   LDLCALC 80 06/04/2020   Lab Results  Component Value Date   TRIG 86 06/04/2020   Lab Results  Component Value Date   CHOLHDL 3 04/29/2019   Lab Results  Component Value Date   HGBA1C 5.7 06/04/2020      Assessment & Plan:   #1 history of adenocarcinoma the prostate. Recent percent free PSA 32.9. -Set up referral to local urologist. He lives over in [place] and would like to see someone over there.  #2 probable BPH. Does have some nocturia and possible incomplete emptying of bladder. Previous intolerance to Flomax. We discussed multiple other options including Cialis, Hytrin, Cardura, Avodart, finasteride. He will discuss with urologist that he establishes with  #3 hypertension currently stable. Continue metoprolol, hydralazine, and losartan HCTZ  #4 ulnar neuropathy. Pain symptoms are transient and very intermittent. We discussed possible referral to hand surgeon and at this point he wishes to wait  No orders of the defined types were placed in this encounter.   Follow-up: No follow-ups on file.    Carolann Littler, MD

## 2020-10-17 ENCOUNTER — Telehealth: Payer: Self-pay | Admitting: Family Medicine

## 2020-10-17 MED ORDER — METOPROLOL SUCCINATE ER 50 MG PO TB24: 50.0000 mg | ORAL_TABLET | Freq: Every day | ORAL | 3 refills | Status: DC

## 2020-10-17 MED ORDER — LOSARTAN POTASSIUM-HCTZ 100-12.5 MG PO TABS: 1.0000 | ORAL_TABLET | Freq: Every day | ORAL | 0 refills | Status: DC

## 2020-10-17 MED ORDER — ATORVASTATIN CALCIUM 20 MG PO TABS: 20.0000 mg | ORAL_TABLET | Freq: Every day | ORAL | 1 refills | Status: DC

## 2020-10-17 NOTE — Telephone Encounter (Addendum)
Patient is calling and requesting a refill for metoprolol succinate (TOPROL-XL) 50 MG 24 hr tablet,  losartan-hydrochlorothiazide (HYZAAR) 100-12.5 MG tablet,  hydrALAZINE (APRESOLINE) 25 MG tablet and atorvastatin (LIPITOR) 20 MG tablet   sent to [facility], please advise. CB is [phone]

## 2020-10-23 ENCOUNTER — Encounter: Payer: Self-pay | Admitting: Urology

## 2020-10-23 ENCOUNTER — Ambulatory Visit: Payer: Medicare Other | Admitting: Urology

## 2020-10-23 ENCOUNTER — Other Ambulatory Visit: Payer: Self-pay

## 2020-10-23 VITALS — BP 154/80 | HR 81 | Ht 72.0 in | Wt 207.0 lb

## 2020-10-23 DIAGNOSIS — N281 Cyst of kidney, acquired: Secondary | ICD-10-CM

## 2020-10-23 DIAGNOSIS — C61 Malignant neoplasm of prostate: Secondary | ICD-10-CM

## 2020-10-23 DIAGNOSIS — R972 Elevated prostate specific antigen [PSA]: Secondary | ICD-10-CM | POA: Diagnosis not present

## 2020-10-23 DIAGNOSIS — N401 Enlarged prostate with lower urinary tract symptoms: Secondary | ICD-10-CM

## 2020-10-23 DIAGNOSIS — R350 Frequency of micturition: Secondary | ICD-10-CM

## 2020-10-23 NOTE — Progress Notes (Signed)
10/23/2020 1:12 PM   Jimmy Rice [birth year] [id number]  Referring provider: Eulas Post, MD [place],   [address]  Chief Complaint  Patient presents with   Prostate Cancer    HPI: 75 year old male who presents to establish care.  He has a personal history of low risk prostate cancer, BPH, renal cysts and is seen multiple urologist in the past.  He has a personal history of low risk prostate cancer Gleason 3+3 (dx 2018) on active surveillance.  He was being followed both at [facility] as well as the [place] in [facility] for this.  Review of records from [place] in [place] where he sought a third opinion.  PSA generally in the range of 5-7 for many years.  He has had multiple prostate biopsies (six) of which only the most recent had a single focus of Gleason 3+3, less than 5%.  He also has a personal history of right renal cyst and underwent laparoscopic cyst decortication for an 8.6 cm cyst and January 2021.  He reports that he was symptomatic from the cyst.  Cyst wall surgical pathology was benign.  He does also have a history of BPH.  He underwent TURP in 2016.  He reports that this was effective for 6 months or more but then he started to have weakening of his urinary stream and recurrence of his symptoms including urgency and frequency both in the daytime and nighttime.  More recently, he sought another opinion by Dr. Eda Keys in [place] and underwent UroLift in 2020.  He does not have those records for [place] today.  He reports that he had 9 implants placed.  Prostate volume is unknown.  He is not currently on any BPH medications.  His most recent PSA was 9.38 on 06/04/2020.  This was performed at the [facility].  Most recent rectal exam in 06/2020 unremarkable by his urologist, Dr. Leretha Pol.  He reports today that his symptoms are not well controlled.  He continues to have both obstructive and irritative urinary  symptoms including weak stream, urgency, frequency and nocturia.  He does not feel like his symptoms improve much with the UroLift.  He is currently on no BPH medications.  He is not able to tolerate Flomax.  PMH: 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   Complication of anesthesia    woke up "fighting"  confusion   GERD 01/18/2009   HYPERTENSION 01/18/2009   HYPOGONADISM 01/18/2009    Surgical History: Past Surgical History:  Procedure Laterality Date   COLONOSCOPY     INGUINAL HERNIA REPAIR Right [place]   PARTIAL KNEE ARTHROPLASTY Right 07/22/2016   Procedure: UNICOMPARTMENTAL KNEE ARTHROPLASTY;  Surgeon: Renette Butters, MD;  Location: [place];  Service: Orthopedics;  Laterality: Right;   PROSTATE BIOPSY  X 5   REPLACEMENT UNICONDYLAR JOINT KNEE Right 07/22/2016   TRANSURETHRAL RESECTION OF PROSTATE  12/2014    Home Medications:  Allergies as of 10/23/2020      Reactions   Amlodipine Hives   Aspirin Hives   Flomax [tamsulosin Hcl] Other (See Comments)   DIFFICULTY BREATHING NASAL STUFFINESS   Tamsulosin Other (See Comments)  DIFFICULTY BREATHING NASAL STUFFINESS DIFFICULTY BREATHING NASAL STUFFINESS      Medication List       Accurate as of October 23, 2020  1:12 PM. If you have any questions, ask your nurse or doctor.        atorvastatin 20 MG tablet Commonly known as: LIPITOR Take 1 tablet (20 mg total) by mouth daily.   hydrALAZINE 25 MG tablet Commonly known as: APRESOLINE Take 1 tablet (25 mg total) by mouth 3 (three) times daily.   losartan-hydrochlorothiazide 100-12.5 MG tablet Commonly known as: HYZAAR Take 1 tablet by mouth daily. Take one tablet by mouth once daily    metoprolol succinate 50 MG 24 hr tablet Commonly known as: TOPROL-XL Take 1 tablet (50 mg total) by mouth daily. Take with or immediately following a meal.       Allergies:  Allergies  Allergen Reactions   Amlodipine Hives   Aspirin Hives   Flomax [Tamsulosin Hcl] Other (See Comments)    DIFFICULTY BREATHING NASAL STUFFINESS   Tamsulosin Other (See Comments)    DIFFICULTY BREATHING NASAL STUFFINESS DIFFICULTY BREATHING NASAL STUFFINESS    Family History: Family History  Problem Relation Age of Onset   Breast cancer Mother    Kidney disease Father    Stomach cancer Paternal Aunt    Breast cancer Sister    Bladder Cancer Brother    Colon cancer Neg Hx    Esophageal cancer Neg Hx    Rectal cancer Neg Hx     Social History:  reports that he has quit smoking. His smoking use included cigarettes. He has never used smokeless tobacco. He reports previous alcohol use of about 1.0 standard drink of alcohol per week. He reports that he does not use drugs.   Physical Exam: BP (!) 154/80    Pulse 81    Ht 6' (1.829 m)    Wt 207 lb (93.9 kg)    BMI 28.07 kg/m   Constitutional:  Alert and oriented, No acute distress. HEENT: [place] AT, moist mucus membranes.  Trachea midline, no masses. Cardiovascular: No clubbing, cyanosis, or edema. Respiratory: Normal respiratory effort, no increased work of breathing. Skin: No rashes, bruises or suspicious lesions. Neurologic: Grossly intact, no focal deficits, moving all 4 extremities. Psychiatric: Normal mood and affect.  Laboratory Data: Lab Results  Component Value Date   WBC 4.1 06/04/2020   HGB 13.8 06/04/2020   HCT 41 06/04/2020   MCV 90.7 02/23/2019   PLT 185 06/04/2020    Lab Results  Component Value Date   CREATININE 1.9 (A) 06/04/2020    Lab Results  Component Value Date   PSA 9.380 06/04/2020   PSA 7.41 (H) 05/19/2018   PSA 6.54 (H) 03/31/2017    Lab Results  Component Value Date   TESTOSTERONE 305.21  10/26/2015    Lab Results  Component Value Date   HGBA1C 5.7 06/04/2020    Urinalysis    Component Value Date/Time   COLORURINE YELLOW 02/23/2019 1845   APPEARANCEUR CLEAR 02/23/2019 1845   LABSPEC 1.016 02/23/2019 1845   PHURINE 5.0 02/23/2019 1845   GLUCOSEU NEGATIVE 02/23/2019 1845   GLUCOSEU NEGATIVE 10/12/2018 0904   HGBUR SMALL (A) 02/23/2019 1845   HGBUR trace-intact 02/08/2009 1401   BILIRUBINUR NEGATIVE 02/23/2019 1845   BILIRUBINUR neg 08/10/2017 1720   KETONESUR NEGATIVE 02/23/2019 1845   PROTEINUR NEGATIVE 02/23/2019 1845   UROBILINOGEN 1.0 10/12/2018 0904   NITRITE NEGATIVE 02/23/2019 1845   LEUKOCYTESUR NEGATIVE 02/23/2019 1845  Lab Results  Component Value Date   BACTERIA NONE SEEN 02/23/2019     Assessment & Plan:    1. Prostate cancer [facility]) Personal history of low risk prostate cancer on active surveillance  His PSA has been trending upwards  Reviewed numerous records including from [facility] as well as a few that he brought with him from the [place] and [place].  I agree that initially active surveillance was the most appropriate.  He is not had a follow-up biopsy since.  His PSA is in fact rising.  We discussed repeat biopsy versus prostate MRI as the next step to ensuring that active surveillance is the most appropriate.  He is very interested in prostate MRI.  We will order this and have him follow-up with [place] thereafter.  - MR PROSTATE W WO CONTRAST; Future  2. Rising PSA level As above  3. Renal cyst Personal history of multiple bilateral renal cyst which are fairly large and CKD?  Polycystic kidney disease  Status post cyst decortication with symptomatic relief.  We will follow clinically.  4. Benign prostatic hyperplasia with urinary frequency He is now status post TURP as well as UroLift.  We requested records from Dr. Rosezena Sensor office to get a sense of his prostate anatomy and volume.  He may benefit from treatment  of his storage symptoms, will discuss further at his follow-up visit.    Lastly, there were numerous records to review today.  The patient brought several of these with him.  He has had many urologist over the past several years including the [place], [facility], second opinions at [facility] as well as [place].  We discussed today that although I do think it is wise on occasion to get second opinions especially when there is a diagnostic question or controversy, it would behoove him to stick with a single urologist to manage all of these issues to ensure that all aspects of his urologic care are being managed appropriately and work is not being duplicated.  He lives here in [place].  He would like to continue to follow here as his primary urologist to which I agreed.   Follow-up after prostate MRI  Hollice Espy, MD  [facility] [address], [place],  [address] [phone]  I spent 60 total minutes on the day of the encounter including pre-visit review of the medical record, face-to-face time with the patient, and post visit ordering of labs/imaging/tests.  The majority of the time was spent reviewing records available in care everywhere as well as records that the patient physically brought to the appointment today.

## 2020-11-02 ENCOUNTER — Ambulatory Visit: Payer: Medicare Other

## 2020-11-14 ENCOUNTER — Ambulatory Visit
Admission: RE | Admit: 2020-11-14 | Discharge: 2020-11-14 | Disposition: A | Discharge status: Home / Self Care | Payer: Medicare Other | Source: Ambulatory Visit | Attending: Urology | Admitting: Urology

## 2020-11-14 ENCOUNTER — Other Ambulatory Visit: Payer: Self-pay

## 2020-11-14 DIAGNOSIS — C61 Malignant neoplasm of prostate: Secondary | ICD-10-CM | POA: Diagnosis not present

## 2020-11-14 IMAGING — MR MR PROSTATE WO/W CM
56 series · 56 of 56 positions shown · IV contrast (gadavist)
Comparison: Report from prostate MRI from [facility] [REDACTED] dated [DATE] (obtained
through [REDACTED] [REDACTED])

CLINICAL DATA: Chronic elevated PSA levels, most recent PSA of
on [DATE]. Most recent biopsy had small volume Gleason 3+3=6
adenocarcinoma. Prior TURP in [2N]. Lower urinary tract symptoms.
Urolift in [2N].

EXAM:
MR PROSTATE WITHOUT AND WITH CONTRAST
TECHNIQUE: Multiplanar multisequence MRI images were obtained of the pelvis
centered about the prostate. Pre and post contrast images were
obtained.
CONTRAST:  9mL GADAVIST GADOBUTROL 1 MMOL/ML IV SOLN

[Series 3: ax in&out whole · axial · 6.0mm · 0.74mm/px · 1 of 35 slices shown (1 of 2)]
[im 1/35]
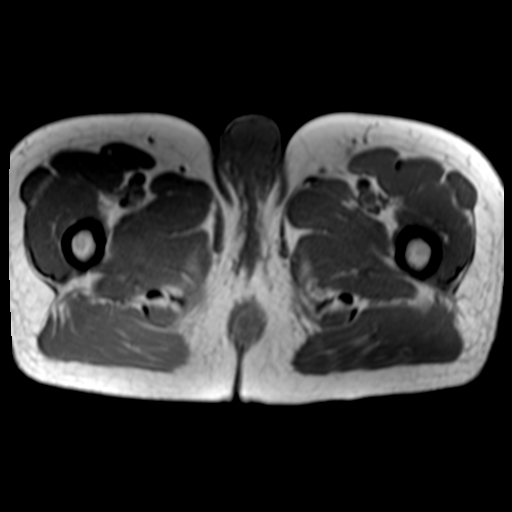

[Series 3: ax in&out whole · axial · 6.0mm · 0.74mm/px · 1 of 35 slices shown (2 of 2)]
[im 1/35]
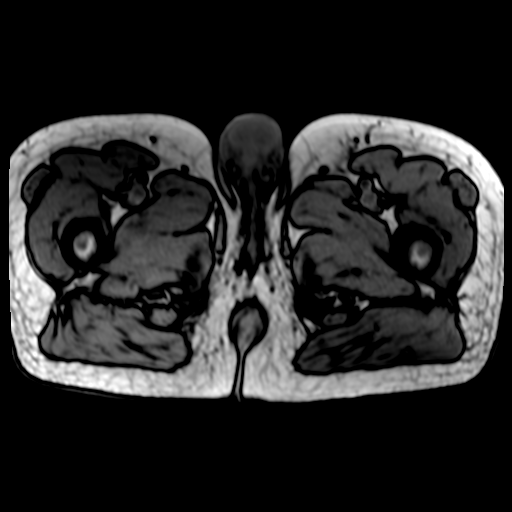

[Series 4: T2 · axial · 3.0mm · 0.56mm/px · 1 of 29 slices shown (1 of 3)]
[im 1/29]
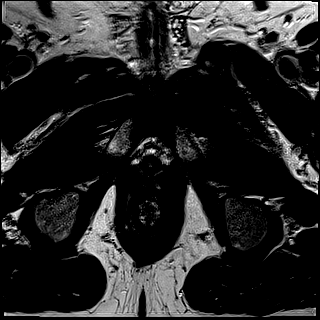

[Series 5: T2 · coronal · 3.0mm · 0.70mm/px · 1 of 35 slices shown (2 of 3)]
[im 1/35]
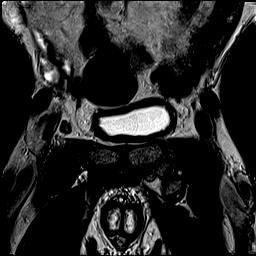

[Series 6: DWI · axial · 3.0mm · 0.86mm/px · 1 of 87 slices shown (1 of 3)]
[im 1/87]
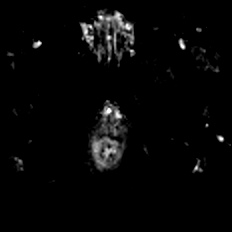

[Series 7: DWI · axial · 3.0mm · 0.86mm/px · 1 of 29 slices shown (2 of 3)]
[im 1/29]
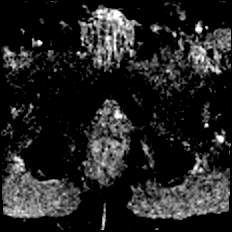

[Series 8: DWI · axial · 3.0mm · 0.86mm/px · 1 of 29 slices shown (3 of 3)]
[im 1/29]
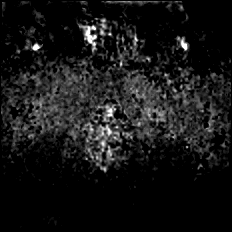

[Series 9: T2 · axial · 1.0mm · 1.04mm/px · 1 of 96 slices shown (3 of 3)]
[im 1/96]
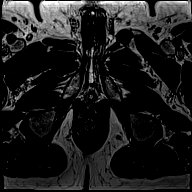

[Series 10: T1 · axial · 3.0mm · 1.15mm/px · 1 of 30 slices shown (1 of 48)]
[im 1/30]
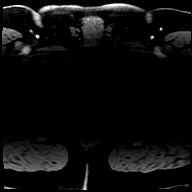

[Series 11: T1 · axial · 3.0mm · 1.15mm/px · 1 of 30 slices shown (2 of 48)]
[im 1/30]
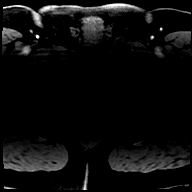

[Series 12: T1 · axial · 3.0mm · 1.15mm/px · 1 of 30 slices shown (3 of 48)]
[im 1/30]
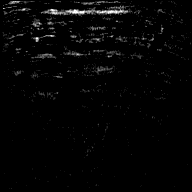

[Series 13: T1 · axial · 3.0mm · 1.15mm/px · 1 of 30 slices shown (4 of 48)]
[im 1/30]
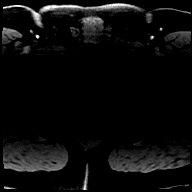

[Series 14: T1 · axial · 3.0mm · 1.15mm/px · 1 of 30 slices shown (5 of 48)]
[im 1/30]
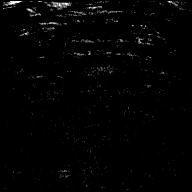

[Series 15: T1 · axial · 3.0mm · 1.15mm/px · 1 of 30 slices shown (6 of 48)]
[im 1/30]
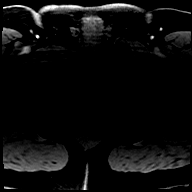

[Series 16: T1 · axial · 3.0mm · 1.15mm/px · 1 of 30 slices shown (7 of 48)]
[im 1/30]
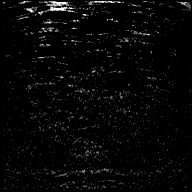

[Series 17: T1 · axial · 3.0mm · 1.15mm/px · 1 of 30 slices shown (8 of 48)]
[im 1/30]
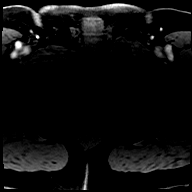

[Series 18: T1 · axial · 3.0mm · 1.15mm/px · 1 of 29 slices shown (9 of 48)]
[im 1/29]
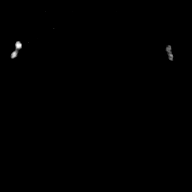

[Series 19: T1 · axial · 3.0mm · 1.15mm/px · 1 of 30 slices shown (10 of 48)]
[im 1/30]
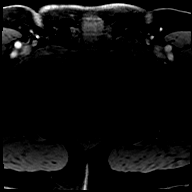

[Series 20: T1 · axial · 3.0mm · 1.15mm/px · 1 of 30 slices shown (11 of 48)]
[im 1/30]
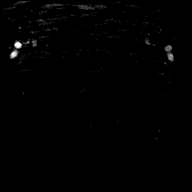

[Series 21: T1 · axial · 3.0mm · 1.15mm/px · 1 of 30 slices shown (12 of 48)]
[im 1/30]
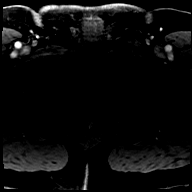

[Series 22: T1 · axial · 3.0mm · 1.15mm/px · 1 of 30 slices shown (13 of 48)]
[im 1/30]
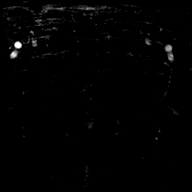

[Series 23: T1 · axial · 3.0mm · 1.15mm/px · 1 of 30 slices shown (14 of 48)]
[im 1/30]
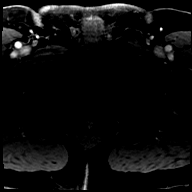

[Series 24: T1 · axial · 3.0mm · 1.15mm/px · 1 of 30 slices shown (15 of 48)]
[im 1/30]
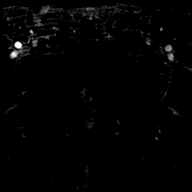

[Series 25: T1 · axial · 3.0mm · 1.15mm/px · 1 of 30 slices shown (16 of 48)]
[im 1/30]
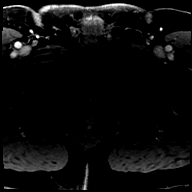

[Series 26: T1 · axial · 3.0mm · 1.15mm/px · 1 of 30 slices shown (17 of 48)]
[im 1/30]
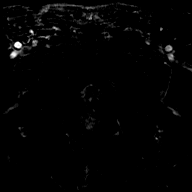

[Series 27: T1 · axial · 3.0mm · 1.15mm/px · 1 of 30 slices shown (18 of 48)]
[im 1/30]
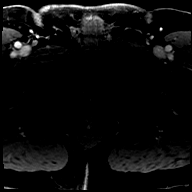

[Series 28: T1 · axial · 3.0mm · 1.15mm/px · 1 of 30 slices shown (19 of 48)]
[im 1/30]
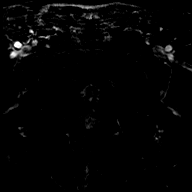

[Series 29: T1 · axial · 3.0mm · 1.15mm/px · 1 of 30 slices shown (20 of 48)]
[im 1/30]
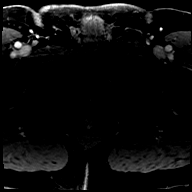

[Series 30: T1 · axial · 3.0mm · 1.15mm/px · 1 of 30 slices shown (21 of 48)]
[im 1/30]
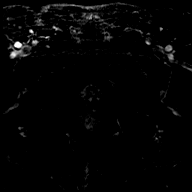

[Series 31: T1 · axial · 3.0mm · 1.15mm/px · 1 of 30 slices shown (22 of 48)]
[im 1/30]
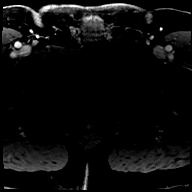

[Series 32: T1 · axial · 3.0mm · 1.15mm/px · 1 of 30 slices shown (23 of 48)]
[im 1/30]
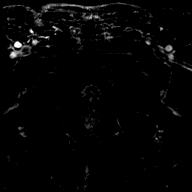

[Series 33: T1 · axial · 3.0mm · 1.15mm/px · 1 of 30 slices shown (24 of 48)]
[im 1/30]
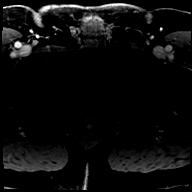

[Series 34: T1 · axial · 3.0mm · 1.15mm/px · 1 of 30 slices shown (25 of 48)]
[im 1/30]
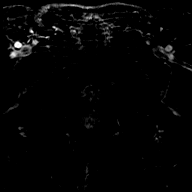

[Series 35: T1 · axial · 3.0mm · 1.15mm/px · 1 of 30 slices shown (26 of 48)]
[im 1/30]
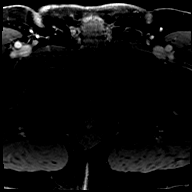

[Series 36: T1 · axial · 3.0mm · 1.15mm/px · 1 of 30 slices shown (27 of 48)]
[im 1/30]
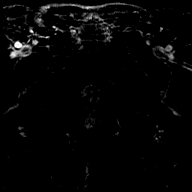

[Series 37: T1 · axial · 3.0mm · 1.15mm/px · 1 of 30 slices shown (28 of 48)]
[im 1/30]
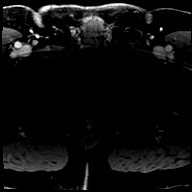

[Series 38: T1 · axial · 3.0mm · 1.15mm/px · 1 of 30 slices shown (29 of 48)]
[im 1/30]
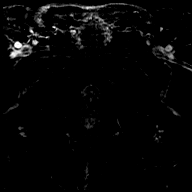

[Series 39: T1 · axial · 3.0mm · 1.15mm/px · 1 of 30 slices shown (30 of 48)]
[im 1/30]
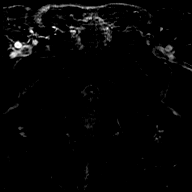

[Series 40: T1 · axial · 3.0mm · 1.15mm/px · 1 of 30 slices shown (31 of 48)]
[im 1/30]
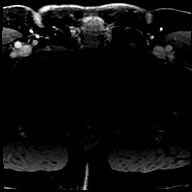

[Series 41: T1 · axial · 3.0mm · 1.15mm/px · 1 of 30 slices shown (32 of 48)]
[im 1/30]
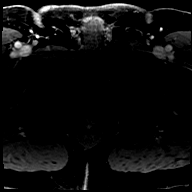

[Series 42: T1 · axial · 3.0mm · 1.15mm/px · 1 of 30 slices shown (33 of 48)]
[im 1/30]
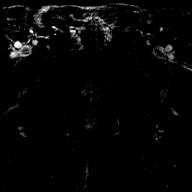

[Series 43: T1 · axial · 3.0mm · 1.15mm/px · 1 of 30 slices shown (34 of 48)]
[im 1/30]
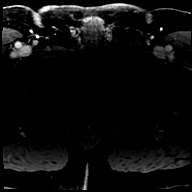

[Series 44: T1 · axial · 3.0mm · 1.15mm/px · 1 of 30 slices shown (35 of 48)]
[im 1/30]
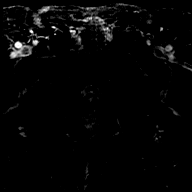

[Series 45: T1 · axial · 3.0mm · 1.15mm/px · 1 of 30 slices shown (36 of 48)]
[im 1/30]
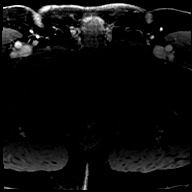

[Series 46: T1 · axial · 3.0mm · 1.15mm/px · 1 of 30 slices shown (37 of 48)]
[im 1/30]
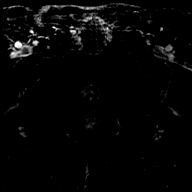

[Series 47: T1 · axial · 3.0mm · 1.15mm/px · 1 of 30 slices shown (38 of 48)]
[im 1/30]
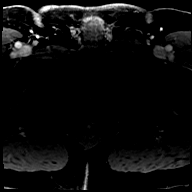

[Series 48: T1 · axial · 3.0mm · 1.15mm/px · 1 of 30 slices shown (39 of 48)]
[im 1/30]
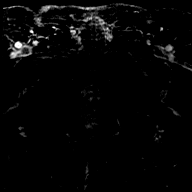

[Series 49: T1 · axial · 3.0mm · 1.15mm/px · 1 of 30 slices shown (40 of 48)]
[im 1/30]
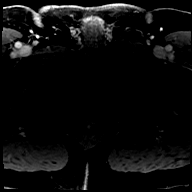

[Series 50: T1 · axial · 3.0mm · 1.15mm/px · 1 of 30 slices shown (41 of 48)]
[im 1/30]
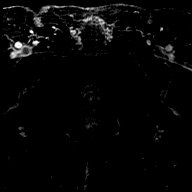

[Series 51: T1 · axial · 3.0mm · 1.15mm/px · 1 of 30 slices shown (42 of 48)]
[im 1/30]
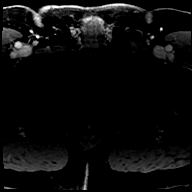

[Series 52: T1 · axial · 3.0mm · 1.15mm/px · 1 of 30 slices shown (43 of 48)]
[im 1/30]
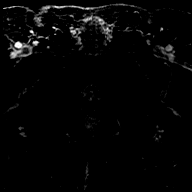

[Series 53: T1 · axial · 3.0mm · 1.15mm/px · 1 of 30 slices shown (44 of 48)]
[im 1/30]
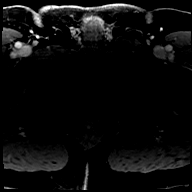

[Series 54: T1 · axial · 3.0mm · 1.15mm/px · 1 of 30 slices shown (45 of 48)]
[im 1/30]
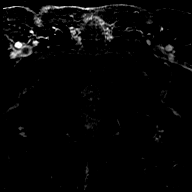

[Series 55: T1 · axial · 3.0mm · 1.15mm/px · 1 of 30 slices shown (46 of 48)]
[im 1/30]
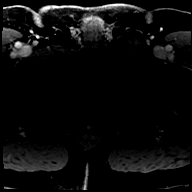

[Series 56: T1 · axial · 3.0mm · 1.15mm/px · 1 of 30 slices shown (47 of 48)]
[im 1/30]
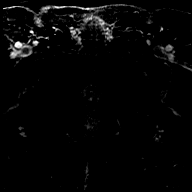

[Series 57: T1 · axial · 3.0mm · 1.15mm/px · 1 of 30 slices shown (48 of 48)]
[im 1/30]
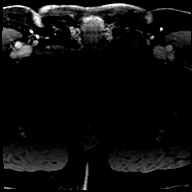

[56 of 56 positions shown; findings below may reference images not displayed]

FINDINGS: Prostate: Bilateral low signal structures in the upper portion of
the transition zone compatible with prior uro lift.

Considerable encapsulated nodularity of the transition zone
indicating benign prostate hypertrophy. Prior TURP.

Region of interest # 1: PI-RADS category 4 lesion of the left
posterior transition zone at the apex, with ill-defined partial
encapsulation and considerably accentuated restriction of diffusion
with low ADC map activity. This lesion measures 0.90 cubic cm (1.5
by 0.8 by 0.8 cm) and is shown for example on image 22 series 8 and
image 22 series 7.

Region of interest # 2: PI-RADS category 3 lesion of the left
posterolateral peripheral zone at the apex, with reduced T2 signal
but without definite focal early enhancement. This measures
cubic cm (1.9 by 0.6 by 0.7 cm) and is shown for example on image 19
of series 4.

Region of interest # 3: PI-RADS category 3 lesion of the right
posterolateral peripheral zone in the mid gland, with reduced T2
signal but no definite early enhancement. This measures 0.64 cubic
cm (1.4 by 0.4 by 1.1 cm) and is shown for example on image 16 of
series 4.

The peripheral zone is somewhat thinned, with streaky low T2 signal
which is probably mostly postinflammatory.

Volume: 3D volumetric analysis: Prostate volume 120.73 cubic cm (6.5
by 6.4 by 5.8 cm).

Transcapsular spread:  Absent

Seminal vesicle involvement: Absent

Neurovascular bundle involvement: Absent

Pelvic adenopathy: Absent

Bone metastasis: Absent

Other findings: Sigmoid colon diverticulosis.
IMPRESSION: 1. PI-RADS category 4 lesion of the left posterior transition zone
at the apex.
2. PI-RADS category 3 lesion of the left posterolateral peripheral
zone at the apex.
3. PI-RADS category 3 lesion of the right posterolateral peripheral
zone in the mid gland.
4. Region of interest # 3: 0.64 cubic cm lesion of the right
posterolateral peripheral zone in the mid gland.
5. Prior UroLift and TURP.
6. Considerable encapsulated nodularity of the transition zone
indicating benign prostate hypertrophy. Prostate volume 120.73 cubic
cm.
7. Sigmoid colon diverticulosis.

## 2020-11-14 MED ORDER — GADOBUTROL 1 MMOL/ML IV SOLN
9.0000 mL | Freq: Once | INTRAVENOUS | Status: AC | PRN
  Administered 2020-11-14: 9 mL via INTRAVENOUS

## 2020-11-28 ENCOUNTER — Ambulatory Visit: Payer: Self-pay | Admitting: Urology

## 2020-11-30 ENCOUNTER — Other Ambulatory Visit: Payer: Self-pay

## 2020-11-30 ENCOUNTER — Encounter: Payer: Self-pay | Admitting: Emergency Medicine

## 2020-11-30 ENCOUNTER — Emergency Department
Admission: EM | Admit: 2020-11-30 | Discharge: 2020-11-30 | Disposition: A | Discharge status: Home / Self Care | Payer: Medicare Other | Attending: Emergency Medicine | Admitting: Emergency Medicine

## 2020-11-30 DIAGNOSIS — I129 Hypertensive chronic kidney disease with stage 1 through stage 4 chronic kidney disease, or unspecified chronic kidney disease: Secondary | ICD-10-CM | POA: Diagnosis not present

## 2020-11-30 DIAGNOSIS — Z96651 Presence of right artificial knee joint: Secondary | ICD-10-CM | POA: Insufficient documentation

## 2020-11-30 DIAGNOSIS — Z87891 Personal history of nicotine dependence: Secondary | ICD-10-CM | POA: Insufficient documentation

## 2020-11-30 DIAGNOSIS — R197 Diarrhea, unspecified: Secondary | ICD-10-CM | POA: Insufficient documentation

## 2020-11-30 DIAGNOSIS — J45909 Unspecified asthma, uncomplicated: Secondary | ICD-10-CM | POA: Insufficient documentation

## 2020-11-30 DIAGNOSIS — E860 Dehydration: Secondary | ICD-10-CM

## 2020-11-30 DIAGNOSIS — N183 Chronic kidney disease, stage 3 unspecified: Secondary | ICD-10-CM | POA: Diagnosis not present

## 2020-11-30 DIAGNOSIS — Z79899 Other long term (current) drug therapy: Secondary | ICD-10-CM | POA: Insufficient documentation

## 2020-11-30 DIAGNOSIS — Z8546 Personal history of malignant neoplasm of prostate: Secondary | ICD-10-CM | POA: Diagnosis not present

## 2020-11-30 LAB — URINALYSIS, COMPLETE (UACMP) WITH MICROSCOPIC
Bilirubin Urine: NEGATIVE
Glucose, UA: NEGATIVE mg/dL
Hgb urine dipstick: NEGATIVE
Ketones, ur: NEGATIVE mg/dL
Leukocytes,Ua: NEGATIVE
Nitrite: NEGATIVE
Protein, ur: 100 mg/dL — AB
Specific Gravity, Urine: 1.02 (ref 1.005–1.030)
pH: 5 (ref 5.0–8.0)

## 2020-11-30 LAB — COMPREHENSIVE METABOLIC PANEL
ALT: 24 U/L (ref 0–44)
AST: 23 U/L (ref 15–41)
Albumin: 4.4 g/dL (ref 3.5–5.0)
Alkaline Phosphatase: 61 U/L (ref 38–126)
Anion gap: 9 (ref 5–15)
BUN: 27 mg/dL — ABNORMAL HIGH (ref 8–23)
CO2: 22 mmol/L (ref 22–32)
Calcium: 9.1 mg/dL (ref 8.9–10.3)
Chloride: 104 mmol/L (ref 98–111)
Creatinine, Ser: 2.26 mg/dL — ABNORMAL HIGH (ref 0.61–1.24)
GFR, Estimated: 30 mL/min — ABNORMAL LOW (ref >60)
Glucose, Bld: 135 mg/dL — ABNORMAL HIGH (ref 70–99)
Potassium: 3.6 mmol/L (ref 3.5–5.1)
Sodium: 135 mmol/L (ref 135–145)
Total Bilirubin: 1.4 mg/dL — ABNORMAL HIGH (ref 0.3–1.2)
Total Protein: 8.4 g/dL — ABNORMAL HIGH (ref 6.5–8.1)

## 2020-11-30 LAB — CBC
HCT: 43.4 % (ref 39.0–52.0)
Hemoglobin: 14.2 g/dL (ref 13.0–17.0)
MCH: 29.7 pg (ref 26.0–34.0)
MCHC: 32.7 g/dL (ref 30.0–36.0)
MCV: 90.8 fL (ref 80.0–100.0)
Platelets: 152 10*3/uL (ref 150–400)
RBC: 4.78 MIL/uL (ref 4.22–5.81)
RDW: 12.3 % (ref 11.5–15.5)
WBC: 3.4 10*3/uL — ABNORMAL LOW (ref 4.0–10.5)
nRBC: 0 % (ref 0.0–0.2)

## 2020-11-30 LAB — LIPASE, BLOOD: Lipase: 28 U/L (ref 11–51)

## 2020-11-30 MED ORDER — SODIUM CHLORIDE 0.9 % IV BOLUS
1000.0000 mL | Freq: Once | INTRAVENOUS | Status: AC
  Administered 2020-11-30: 1000 mL via INTRAVENOUS

## 2020-11-30 MED ORDER — LOPERAMIDE HCL 2 MG PO CAPS
4.0000 mg | ORAL_CAPSULE | Freq: Once | ORAL | Status: AC
  Administered 2020-11-30: 4 mg via ORAL
  Filled 2020-11-30: qty 2

## 2020-11-30 NOTE — ED Provider Notes (Signed)
[facility] Emergency Department Provider Note  Time seen: 2:17 PM  I have reviewed the triage vital signs and the nursing notes.   HISTORY  Chief Complaint Diarrhea   HPI Jimmy Rice is a 75 y.o. male with a past medical history of hypertension, CKD presents to the emergency department for diarrhea.  According to patient since last night he has been experiencing loose stool.  States he has had diarrhea throughout the night and several times today.  Has not taken any over-the-counter medications.  Denies any nausea or vomiting.  No fever cough or shortness of breath.  States he was concerned that he could be dehydrated so he came to the emergency department for evaluation.  Past Medical History:  Diagnosis Date  . Arthritis    "right knee" (07/22/2016)  . BENIGN PROSTATIC HYPERTROPHY, HX OF 04/04/2009  . Blood transfusion   . Childhood asthma   . Chronic kidney disease (CKD), stage III (moderate) (HCC)    cysts on kidneys, 3rd stage kidney disease  . Complication of anesthesia    woke up "fighting"  confusion  . GERD 01/18/2009  . HYPERTENSION 01/18/2009  . HYPOGONADISM 01/18/2009    Patient Active Problem List   Diagnosis Date Noted  . 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 Bilateral renal cysts 08/20/2017  . Adenocarcinoma of prostate ([place]) 03/31/2017  . Primary osteoarthritis of right knee 06/11/2016  . Increased frequency of urination 01/11/2014  . Nocturia 01/11/2014  . Acute constipation 12/20/2013  . CKD (chronic kidney disease) stage 3, GFR 30-59 ml/min (HCC) 12/02/2012  . Elevated PSA 12/27/2010  . CHRONIC RHINITIS 10/09/2010  . COUGH 10/09/2010  . ALLERGIC RHINITIS 09/03/2010  . BPH (benign prostatic hyperplasia) 04/04/2009  . Low testosterone 01/18/2009  . Essential hypertension 01/18/2009  . Asthma 01/18/2009  . GERD 01/18/2009    Past Surgical History:  Procedure Laterality Date  .  COLONOSCOPY    . INGUINAL HERNIA REPAIR Right 1997  . NASAL SINUS SURGERY  1997  . PARTIAL KNEE ARTHROPLASTY Right 07/22/2016   Procedure: UNICOMPARTMENTAL KNEE ARTHROPLASTY;  Surgeon: Renette Butters, MD;  Location: [place];  Service: Orthopedics;  Laterality: Right;  . PROSTATE BIOPSY  X 5  . 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 TRANSURETHRAL RESECTION OF PROSTATE  12/2014    Prior to Admission medications   Medication Sig Start Date End Date Taking? Authorizing Provider  atorvastatin (LIPITOR) 20 MG tablet Take 1 tablet (20 mg total) by mouth daily. 10/17/20   Burchette, Alinda Sierras, MD  hydrALAZINE (APRESOLINE) 25 MG tablet Take 1 tablet (25 mg total) by mouth 3 (three) times daily. 01/03/20   Burchette, Alinda Sierras, MD  losartan-hydrochlorothiazide (HYZAAR) 100-12.5 MG tablet Take 1 tablet by mouth daily. Take one tablet by mouth once daily 10/17/20   Burchette, Alinda Sierras, MD  metoprolol succinate (TOPROL-XL) 50 MG 24 hr tablet Take 1 tablet (50 mg total) by mouth daily. Take with or immediately following a meal. 10/17/20   Burchette, Alinda Sierras, MD    Allergies  Allergen Reactions  . Amlodipine Hives  . Aspirin Hives  . Flomax [Tamsulosin Hcl] Other (See Comments)    DIFFICULTY BREATHING NASAL STUFFINESS  . Tamsulosin Other (See Comments)    DIFFICULTY BREATHING NASAL STUFFINESS DIFFICULTY BREATHING NASAL STUFFINESS    Family History  Problem Relation Age of Onset  . Breast cancer Mother   . Kidney disease Father   . Stomach cancer Paternal Aunt   .  Breast cancer Sister   . Bladder Cancer Brother   . Colon cancer Neg Hx   . Esophageal cancer Neg Hx   . Rectal cancer Neg Hx     Social History Social History   Tobacco Use  . Smoking status: Former Smoker    Types: Cigarettes  . Smokeless tobacco: Never Used  . Tobacco comment: "quit smoking in the late 1960's; smoked < 1 pack cigarettes TOTAL"  Vaping Use  . Vaping Use: Never used  Substance Use Topics  .  Alcohol use: Not Currently    Alcohol/week: 1.0 standard drink    Types: 1 Cans of beer per week    Comment: none  . Drug use: No    Review of Systems Constitutional: Negative for fever. Cardiovascular: Negative for chest pain. Respiratory: Negative for shortness of breath. Gastrointestinal: Negative for abdominal pain positive for diarrhea but negative for nausea vomiting Genitourinary: Negative for urinary compaints Musculoskeletal: Negative for musculoskeletal complaints Neurological: Negative for headache All other ROS negative  ____________________________________________   PHYSICAL EXAM:  VITAL SIGNS: ED Triage Vitals  Enc Vitals Group     BP 11/30/20 1128 103/76     Pulse Rate 11/30/20 1128 99     Resp 11/30/20 1128 18     Temp 11/30/20 1128 97.6 F (36.4 C)     Temp Source 11/30/20 1128 Oral     SpO2 11/30/20 1129 99 %     Weight --      Height --      Head Circumference --      Peak Flow --      Pain Score 11/30/20 1129 0     Pain Loc --      Pain Edu? --      Excl. in [place]? --     Constitutional: Alert and oriented. Well appearing and in no distress. Eyes: Normal exam ENT      Head: Normocephalic and atraumatic.      Mouth/Throat: Mucous membranes are moist. Cardiovascular: Normal rate, regular rhythm.  Respiratory: Normal respiratory effort without tachypnea nor retractions. Breath sounds are clear  Gastrointestinal: Soft and nontender. No distention.   Musculoskeletal: Nontender with normal range of motion in all extremities.  Neurologic:  Normal speech and language. No gross focal neurologic deficits Skin:  Skin is warm, dry and intact.  Psychiatric: Mood and affect are normal.   ____________________________________________   INITIAL IMPRESSION / ASSESSMENT AND PLAN / ED COURSE  Pertinent labs & imaging results that were available during my care of the patient were reviewed by me and considered in my medical decision making (see chart for  details).   Patient presents emergency department for diarrhea since last night.  Overall the patient appears well has a completely benign abdominal exam.  Patient's creatinine is slightly increased over baseline likely due to dehydration.  We will dose IV fluids, oral loperamide.  We will reassess after complete.  Overall patient appears well and I suspect he will be able to be discharged home with outpatient follow-up.  Jimmy Rice was evaluated in Emergency Department on 11/30/2020 for the symptoms described in the history of present illness. He was evaluated in the context of the global COVID-19 pandemic, which necessitated consideration that the patient might be at risk for infection with the SARS-CoV-2 virus that causes COVID-19. Institutional protocols and algorithms that pertain to the evaluation of patients at risk for COVID-19 are in a state of rapid change based on information released by regulatory  bodies including the CDC and federal and state organizations. These policies and algorithms were followed during the patient's care in the ED.  ____________________________________________   FINAL CLINICAL IMPRESSION(S) / ED DIAGNOSES  Diarrhea   Harvest Dark, MD 11/30/20 1421

## 2020-11-30 NOTE — ED Triage Notes (Signed)
Pt states he has been having diarrhea since last night, states he thinks he has food poisoning. Denies pain, states he is also very weak and tired "all over." NAD.

## 2020-12-06 ENCOUNTER — Ambulatory Visit (INDEPENDENT_AMBULATORY_CARE_PROVIDER_SITE_OTHER): Payer: Medicare Other | Admitting: Urology

## 2020-12-06 ENCOUNTER — Other Ambulatory Visit: Payer: Self-pay

## 2020-12-06 VITALS — BP 155/83 | HR 73 | Ht 72.0 in | Wt 200.0 lb

## 2020-12-06 DIAGNOSIS — R350 Frequency of micturition: Secondary | ICD-10-CM | POA: Diagnosis not present

## 2020-12-06 DIAGNOSIS — N401 Enlarged prostate with lower urinary tract symptoms: Secondary | ICD-10-CM | POA: Diagnosis not present

## 2020-12-06 DIAGNOSIS — C61 Malignant neoplasm of prostate: Secondary | ICD-10-CM

## 2020-12-06 LAB — BLADDER SCAN AMB NON-IMAGING: Scan Result: 0

## 2020-12-06 NOTE — Progress Notes (Signed)
12/06/2020 11:09 AM   Jimmy Rice 01/19/1946 [id number]  Referring provider: Eulas Post, MD [place],  [place] [address]  Chief Complaint  Patient presents with  . Follow-up    HPI: 75 year old male with history of BPH status post TURP and UroLift as well as incidental low risk prostate cancer returns today with rising PSA.  Please see previous notes for details.  In the interim, he underwent prostate MRI.  This indicates a large prostate, 120 g with a capsular nodularity of the transition zone.  There are 3 discrete areas of interest, PI-RADS 4 lesion in the left posterior transition zone at the apex, PI-RADS 3 in the left posterior lateral peripheral zone as well as a PI-RADS 3 in the right posterior lateral peripheral zone at the mid gland.  He continues to have significant urinary symptoms.   IPSS    Row Name 12/06/20 1000         International Prostate Symptom Score   How often have you had the sensation of not emptying your bladder? More than half the time     How often have you had to urinate less than every two hours? More than half the time     How often have you found you stopped and started again several times when you urinated? More than half the time     How often have you found it difficult to postpone urination? More than half the time     How often have you had a weak urinary stream? Less than half the time     How often have you had to strain to start urination? About half the time     How many times did you typically get up at night to urinate? 3 Times     Total IPSS Score 24           Quality of Life due to urinary symptoms   If you were to spend the rest of your life with your urinary condition just the way it is now how would you feel about that? Terrible            Score:  1-7 Mild 8-19 Moderate 20-35 Severe   PMH: Past Medical History:  Diagnosis Date  . Arthritis    "right knee" (07/22/2016)  . BENIGN  PROSTATIC HYPERTROPHY, HX OF 04/04/2009  . Blood transfusion   . Childhood asthma   . Chronic kidney disease (CKD), stage III (moderate) (HCC)    cysts on kidneys, 3rd stage kidney disease  . Complication of anesthesia    woke up "fighting"  confusion  . GERD 01/18/2009  . HYPERTENSION 01/18/2009  . HYPOGONADISM 01/18/2009    Surgical History: Past Surgical History:  Procedure Laterality Date  . COLONOSCOPY    . INGUINAL HERNIA REPAIR Right 1997  . NASAL SINUS SURGERY  1997  . PARTIAL KNEE ARTHROPLASTY Right 07/22/2016   Procedure: UNICOMPARTMENTAL KNEE ARTHROPLASTY;  Surgeon: Renette Butters, MD;  Location: [facility];  Service: Orthopedics;  Laterality: Right;  . PROSTATE BIOPSY  X 5  . 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 TRANSURETHRAL RESECTION OF PROSTATE  12/2014    Home Medications:  Allergies as of 12/06/2020      Reactions   Amlodipine Hives   Aspirin Hives   Flomax [tamsulosin Hcl] Other (See Comments)   DIFFICULTY BREATHING NASAL STUFFINESS   Tamsulosin Other (See Comments)   DIFFICULTY BREATHING NASAL STUFFINESS DIFFICULTY BREATHING NASAL STUFFINESS  Medication List       Accurate as of December 06, 2020 11:09 AM. If you have any questions, ask your nurse or doctor.        atorvastatin 20 MG tablet Commonly known as: LIPITOR Take 1 tablet (20 mg total) by mouth daily.   hydrALAZINE 25 MG tablet Commonly known as: APRESOLINE Take 1 tablet (25 mg total) by mouth 3 (three) times daily.   losartan-hydrochlorothiazide 100-12.5 MG tablet Commonly known as: HYZAAR Take 1 tablet by mouth daily. Take one tablet by mouth once daily   metoprolol succinate 50 MG 24 hr tablet Commonly known as: TOPROL-XL Take 1 tablet (50 mg total) by mouth daily. Take with or immediately following a meal.   omeprazole 40 MG capsule Commonly known as: PRILOSEC TAKE ONE CAPSULE BY MOUTH DAILY (TAKE ON AN EMPTY STOMACH 30 MINUTES PRIOR TO A MEAL)   Saw Palmetto  1000 MG Caps TAKE 1 CAP/TAB BY MOUTH EVERY DAY       Allergies:  Allergies  Allergen Reactions  . Amlodipine Hives  . Aspirin Hives  . Flomax [Tamsulosin Hcl] Other (See Comments)    DIFFICULTY BREATHING NASAL STUFFINESS  . Tamsulosin Other (See Comments)    DIFFICULTY BREATHING NASAL STUFFINESS DIFFICULTY BREATHING NASAL STUFFINESS    Family History: Family History  Problem Relation Age of Onset  . Breast cancer Mother   . Kidney disease Father   . Stomach cancer Paternal Aunt   . Breast cancer Sister   . Bladder Cancer Brother   . Colon cancer Neg Hx   . Esophageal cancer Neg Hx   . Rectal cancer Neg Hx     Social History:  reports that he has quit smoking. His smoking use included cigarettes. He has never used smokeless tobacco. He reports previous alcohol use of about 1.0 standard drink of alcohol per week. He reports that he does not use drugs.   Physical Exam: BP (!) 155/83   Pulse 73   Ht 6' (1.829 m)   Wt 200 lb (90.7 kg)   BMI 27.12 kg/m   Constitutional:  Alert and oriented, No acute distress. HEENT: [place] AT, moist mucus membranes.  Trachea midline, no masses. Cardiovascular: No clubbing, cyanosis, or edema. Respiratory: Normal respiratory effort, no increased work of breathing. Skin: No rashes, bruises or suspicious lesions. Neurologic: Grossly intact, no focal deficits, moving all 4 extremities. Psychiatric: Normal mood and affect.   Pertinent Imaging: Narrative & Impression  CLINICAL DATA:  Chronic elevated PSA levels, most recent PSA of 9.38 on 06/04/2020. 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 Prior TURP in 2016. Lower urinary tract symptoms. Urolift in 2020.  EXAM: MR PROSTATE WITHOUT AND WITH CONTRAST  TECHNIQUE: Multiplanar multisequence MRI images were obtained of the pelvis centered about the prostate. Pre and post contrast images were obtained.  CONTRAST:  48mL GADAVIST GADOBUTROL 1 MMOL/ML IV  SOLN  COMPARISON:  Report from prostate MRI from [facility] dated 02/04/2018 (obtained through [facility] everywhere)  FINDINGS: Prostate: Bilateral low signal structures in the upper portion of the transition zone compatible with prior uro lift.  Considerable encapsulated nodularity of the transition zone indicating benign prostate hypertrophy. Prior TURP.  Region of interest # 1: PI-RADS category 4 lesion of the left posterior transition zone at the apex, with ill-defined partial encapsulation and considerably accentuated restriction of diffusion with low ADC map activity. This lesion measures 0.90 cubic cm (1.5 by 0.8 by 0.8 cm) and  is shown for example on image 22 series 8 and image 22 series 7.  Region of interest # 2: PI-RADS category 3 lesion of the left posterolateral peripheral zone at the apex, with reduced T2 signal but without definite focal early enhancement. This measures 0.52 cubic cm (1.9 by 0.6 by 0.7 cm) and is shown for example on image 19 of series 4.  Region of interest # 3: PI-RADS category 3 lesion of the right posterolateral peripheral zone in the mid gland, with reduced T2 signal but no definite early enhancement. This measures 0.64 cubic cm (1.4 by 0.4 by 1.1 cm) and is shown for example on image 16 of series 4.  The peripheral zone is somewhat thinned, with streaky low T2 signal which is probably mostly postinflammatory.  Volume: 3D volumetric analysis: Prostate volume 120.73 cubic cm (6.5 by 6.4 by 5.8 cm).  Transcapsular spread:  Absent  Seminal vesicle involvement: Absent  Neurovascular bundle involvement: Absent  Pelvic adenopathy: Absent  Bone metastasis: Absent  Other findings: Sigmoid colon diverticulosis.  IMPRESSION: 1. PI-RADS category 4 lesion of the left posterior transition zone at the apex. 2. PI-RADS category 3 lesion of the left posterolateral peripheral zone at  the apex. 3. PI-RADS category 3 lesion of the right posterolateral peripheral zone in the mid gland. 4. Region of interest # 3: 0.64 cubic cm lesion of the right posterolateral peripheral zone in the mid gland. 5. Prior UroLift and TURP. 6. Considerable encapsulated nodularity of the transition zone indicating benign prostate hypertrophy. Prostate volume 120.73 cubic cm. 7. Sigmoid colon diverticulosis.   Electronically Signed   By: Van Clines M.D.   On: 11/14/2020 12:59   MRI was personally reviewed today.  Agree with radiologic interpretation.  Results for orders placed or performed in visit on 12/06/20  BLADDER SCAN AMB NON-IMAGING  Result Value Ref Range   Scan Result 0 ml     Assessment & Plan:    1. Prostate cancer [facility]) Personal history of low risk prostate cancer on active surveillance with rising PSA  MRI was reviewed with the patient today, multiple areas of interest including one lesion of PI-RADS 4  Given the prostamegaly as well as the suspicious lesions and rising PSA, recommended prostate fusion biopsy to target these lesions.  We discussed the risk and benefits in detail.  He is agreeable to proceed.  We will have him have a stent in [place] and follow-up with me for the results thereafter.  This will guide our management of his BPH and ongoing outlet obstruction.  2. Benign prostatic hyperplasia with urinary frequency Multiple previous treatments, will complete work-up for #1 prior to addressing this more extensively - BLADDER SCAN AMB NON-IMAGING - Ambulatory referral to Urology  F/u after fusion biopsy   Hollice Espy, MD  [place] [address], [place], [place] [address] [phone]  I spent 30 total minutes on the day of the encounter including pre-visit review of the medical record, face-to-face time with the patient, and post visit ordering of labs/imaging/tests.

## 2020-12-06 NOTE — Patient Instructions (Signed)
You will be contacted to get your fusion biopsy scheduled

## 2020-12-06 NOTE — Progress Notes (Deleted)
12/06/2020 9:10 AM   Jimmy Rice [birth year] [id number]  Referring provider: Eulas Post, MD [place],  [place] [address]  No chief complaint on file.   HPI: 75 year old male with history of BPH status post TURP and UroLift as well as incidental low risk prostate cancer returns today with rising PSA.  Please see previous notes for details.  In the interim, he underwent prostate MRI.  This indicates a large prostate, 120 g with a capsular nodularity of the transition zone.  There are 3 discrete areas of interest, PI-RADS 4 lesion in the left posterior transition zone at the apex, PI-RADS 3 in the left posterior lateral peripheral zone as well as a PI-RADS 3 in the right posterior lateral peripheral zone at the mid gland.     PMH: Past Medical History:  Diagnosis Date  . Arthritis    "right knee" (07/22/2016)  . BENIGN PROSTATIC HYPERTROPHY, HX OF 04/04/2009  . Blood transfusion   . Childhood asthma   . Chronic kidney disease (CKD), stage III (moderate) (HCC)    cysts on kidneys, 3rd stage kidney disease  . Complication of anesthesia    woke up "fighting"  confusion  . GERD 01/18/2009  . HYPERTENSION 01/18/2009  . HYPOGONADISM 01/18/2009    Surgical History: Past Surgical History:  Procedure Laterality Date  . COLONOSCOPY    . INGUINAL HERNIA REPAIR Right 1997  . NASAL SINUS SURGERY  1997  . PARTIAL KNEE ARTHROPLASTY Right 07/22/2016   Procedure: UNICOMPARTMENTAL KNEE ARTHROPLASTY;  Surgeon: Renette Butters, MD;  Location: [facility];  Service: Orthopedics;  Laterality: Right;  . PROSTATE BIOPSY  X 5  . 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 TRANSURETHRAL RESECTION OF PROSTATE  12/2014    Home Medications:  Allergies as of 12/06/2020      Reactions   Amlodipine Hives   Aspirin Hives   Flomax [tamsulosin Hcl] Other (See Comments)   DIFFICULTY BREATHING NASAL STUFFINESS   Tamsulosin Other (See Comments)   DIFFICULTY  BREATHING NASAL STUFFINESS DIFFICULTY BREATHING NASAL STUFFINESS      Medication List       Accurate as of December 06, 2020  9:10 AM. If you have any questions, ask your nurse or doctor.        atorvastatin 20 MG tablet Commonly known as: LIPITOR Take 1 tablet (20 mg total) by mouth daily.   hydrALAZINE 25 MG tablet Commonly known as: APRESOLINE Take 1 tablet (25 mg total) by mouth 3 (three) times daily.   losartan-hydrochlorothiazide 100-12.5 MG tablet Commonly known as: HYZAAR Take 1 tablet by mouth daily. Take one tablet by mouth once daily   metoprolol succinate 50 MG 24 hr tablet Commonly known as: TOPROL-XL Take 1 tablet (50 mg total) by mouth daily. Take with or immediately following a meal.       Allergies:  Allergies  Allergen Reactions  . Amlodipine Hives  . Aspirin Hives  . Flomax [Tamsulosin Hcl] Other (See Comments)    DIFFICULTY BREATHING NASAL STUFFINESS  . Tamsulosin Other (See Comments)    DIFFICULTY BREATHING NASAL STUFFINESS DIFFICULTY BREATHING NASAL STUFFINESS    Family History: Family History  Problem Relation Age of Onset  . Breast cancer Mother   . Kidney disease Father   . Stomach cancer Paternal Aunt   . Breast cancer Sister   . Bladder Cancer Brother   . Colon cancer Neg Hx   . Esophageal cancer Neg Hx   . Rectal  cancer Neg Hx     Social History:  reports that he has quit smoking. His smoking use included cigarettes. He has never used smokeless tobacco. He reports previous alcohol use of about 1.0 standard drink of alcohol per week. He reports that he does not use drugs.   Physical Exam: There were no vitals taken for this visit.  Constitutional:  Alert and oriented, No acute distress. HEENT: [place] AT, moist mucus membranes.  Trachea midline, no masses. Cardiovascular: No clubbing, cyanosis, or edema. Respiratory: Normal respiratory effort, no increased work of breathing. GI: Abdomen is soft, nontender, nondistended, no  abdominal masses GU: No CVA tenderness Lymph: No cervical or inguinal lymphadenopathy. Skin: No rashes, bruises or suspicious lesions. Neurologic: Grossly intact, no focal deficits, moving all 4 extremities. Psychiatric: Normal mood and affect.  Laboratory Data: Lab Results  Component Value Date   WBC 3.4 (L) 11/30/2020   HGB 14.2 11/30/2020   HCT 43.4 11/30/2020   MCV 90.8 11/30/2020   PLT 152 11/30/2020    Lab Results  Component Value Date   CREATININE 2.26 (H) 11/30/2020    Lab Results  Component Value Date   PSA 9.380 06/04/2020   PSA 7.41 (H) 05/19/2018   PSA 6.54 (H) 03/31/2017    Lab Results  Component Value Date   TESTOSTERONE 305.21 10/26/2015    Lab Results  Component Value Date   HGBA1C 5.7 06/04/2020    Urinalysis    Component Value Date/Time   COLORURINE AMBER (A) 11/30/2020 1133   APPEARANCEUR HAZY (A) 11/30/2020 1133   LABSPEC 1.020 11/30/2020 1133   PHURINE 5.0 11/30/2020 1133   GLUCOSEU NEGATIVE 11/30/2020 1133   GLUCOSEU NEGATIVE 10/12/2018 0904   HGBUR NEGATIVE 11/30/2020 1133   HGBUR trace-intact 02/08/2009 1401   BILIRUBINUR NEGATIVE 11/30/2020 1133   BILIRUBINUR neg 08/10/2017 1720   KETONESUR NEGATIVE 11/30/2020 1133   PROTEINUR 100 (A) 11/30/2020 1133   UROBILINOGEN 1.0 10/12/2018 0904   NITRITE NEGATIVE 11/30/2020 1133   LEUKOCYTESUR NEGATIVE 11/30/2020 1133    Lab Results  Component Value Date   BACTERIA FEW (A) 11/30/2020    Pertinent Imaging: *** No results found for this or any previous visit.  No results found for this or any previous visit.  No results found for this or any previous visit.  No results found for this or any previous visit.  No results found for this or any previous visit.  No results found for this or any previous visit.  No results found for this or any previous visit.  Results for orders placed during the hospital encounter of 08/14/17  CT RENAL STONE STUDY  Narrative CLINICAL DATA:   75 year old male with right flank pain radiating to the groin for the past 6 weeks. Positive for microscopic hematuria.  EXAM: CT ABDOMEN AND PELVIS WITHOUT CONTRAST  TECHNIQUE: Multidetector CT imaging of the abdomen and pelvis was performed following the standard protocol without IV contrast.  COMPARISON:  None.  FINDINGS: Lower chest: The lung bases are clear. Visualized cardiac structures are within normal limits for size. No pericardial effusion. Small hiatal hernia.  Hepatobiliary: Normal hepatic contour and morphology. No discrete hepatic lesions. Normal appearance of the gallbladder. No intra or extrahepatic biliary ductal dilatation.  Pancreas: Unremarkable. No pancreatic ductal dilatation or surrounding inflammatory changes.  Spleen: Normal in size without focal abnormality.  Adrenals/Urinary Tract: Normal adrenal glands. Numerous circumscribed low-attenuation cystic lesions present in both kidneys. Although incompletely characterized in the absence of intravenous contrast, these lesions are  circumscribed and water attenuation and most consistent with simple cysts. The largest on the right is exophytic from the lower pole and measures up to 8 cm in diameter. The largest on the left is exophytic from the interpolar region and measures up to 4.4 cm in diameter. No evidence of hydronephrosis or nephrolithiasis. Circumferential bladder wall thickening likely reflects trabeculation from chronic bladder outlet obstruction in the setting of marked prostatomegaly.  Stomach/Bowel: Colonic diverticular disease without CT evidence of active inflammation. No focal bowel wall thickening or evidence of obstruction.  Vascular/Lymphatic: Limited evaluation in the absence of intravenous contrast. No evidence of aneurysm, significant atherosclerotic plaque or suspicious lymphadenopathy.  Reproductive: Marked prostatomegaly. The prostate gland measures 6 x 6.7 x 5 cm (volume =  100 cm^3)  Other: No abdominal wall hernia or abnormality. No abdominopelvic ascites.  Musculoskeletal: No acute fracture or aggressive appearing lytic or blastic osseous lesion. Lower lumbar degenerative disc disease and facet arthropathy.  IMPRESSION: 1. Marked prostatomegaly (estimated prostatic volume of 100 cm^3) all with evidence of bladder wall trabeculation suggesting chronic outlet obstruction. This may represent the source of the patient's microscopic hematuria. 2. No evidence of hydronephrosis or nephrolithiasis. 3. Large bilateral renal cysts measuring up to 8 cm on the right. This may represent the source of the patient's right flank pain. Although incompletely imaged in the absence of intravenous contrast, this cysts are circumscribed and water in attenuation and most consistent with simple cysts. If this is felt to be the source of the patient's pain, consider referral to Interventional Radiology for ultrasound-guided cyst aspiration. 4. Small hiatal hernia. 5. 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   Electronically Signed By: Jacqulynn Cadet M.D. On: 08/14/2017 08:56   Assessment & Plan:    There are no diagnoses linked to this encounter.  No follow-ups on file.  Hollice Espy, MD  [facility] [address] [place], [place] [address] [phone]

## 2021-01-16 ENCOUNTER — Other Ambulatory Visit: Payer: Self-pay | Admitting: Family Medicine

## 2021-01-25 ENCOUNTER — Telehealth: Payer: Self-pay | Admitting: Urology

## 2021-01-25 NOTE — Telephone Encounter (Signed)
Patient has declined to have his fusion BX right now. Tammy from [facility] has tried to reach him several times to schedule and had been unable to reach him. She contacted me and asked if I could try to reach him, I was not able to reach him so I reached out to his wife but due to no DPR on file I was limited in what I could discuss with her. I did ask her if he could give me a call back to discuss having his Fusion BX and that is when she informed me that he felt it was not needed. She did say that she would speak with him and try to explain why it was needed and try to get him to call me back.   Sharyn Lull

## 2021-03-22 ENCOUNTER — Other Ambulatory Visit: Payer: Self-pay | Admitting: Family Medicine

## 2021-04-03 ENCOUNTER — Encounter: Payer: Self-pay | Admitting: Family Medicine

## 2021-04-03 ENCOUNTER — Other Ambulatory Visit: Payer: Self-pay

## 2021-04-03 ENCOUNTER — Ambulatory Visit: Payer: Medicare (Managed Care) | Admitting: Family Medicine

## 2021-04-03 VITALS — BP 118/68 | HR 71 | Temp 98.4°F | Wt 203.2 lb

## 2021-04-03 DIAGNOSIS — H9193 Unspecified hearing loss, bilateral: Secondary | ICD-10-CM

## 2021-04-03 DIAGNOSIS — N183 Chronic kidney disease, stage 3 unspecified: Secondary | ICD-10-CM

## 2021-04-03 DIAGNOSIS — C61 Malignant neoplasm of prostate: Secondary | ICD-10-CM

## 2021-04-03 DIAGNOSIS — R972 Elevated prostate specific antigen [PSA]: Secondary | ICD-10-CM | POA: Diagnosis not present

## 2021-04-03 DIAGNOSIS — E785 Hyperlipidemia, unspecified: Secondary | ICD-10-CM | POA: Diagnosis not present

## 2021-04-03 LAB — LIPID PANEL
Cholesterol: 148 mg/dL (ref 0–200)
HDL: 41.3 mg/dL (ref >39.00)
LDL Cholesterol: 74 mg/dL (ref 0–99)
NonHDL: 106.22
Total CHOL/HDL Ratio: 4
Triglycerides: 159 mg/dL — ABNORMAL HIGH (ref 0.0–149.0)
VLDL: 31.8 mg/dL (ref 0.0–40.0)

## 2021-04-03 LAB — BASIC METABOLIC PANEL
BUN: 21 mg/dL (ref 6–23)
CO2: 29 mEq/L (ref 19–32)
Calcium: 9.6 mg/dL (ref 8.4–10.5)
Chloride: 104 mEq/L (ref 96–112)
Creatinine, Ser: 1.88 mg/dL — ABNORMAL HIGH (ref 0.40–1.50)
GFR: 34.53 mL/min — ABNORMAL LOW (ref >60.00)
Glucose, Bld: 118 mg/dL — ABNORMAL HIGH (ref 70–99)
Potassium: 4.1 mEq/L (ref 3.5–5.1)
Sodium: 141 mEq/L (ref 135–145)

## 2021-04-03 NOTE — Progress Notes (Signed)
Established Patient Office Visit  Subjective:  Patient ID: Jimmy Rice, male    DOB: 1946-07-21  Age: 75 y.o. MRN: [id number]  CC:  Chief Complaint  Patient presents with   Referral    Wants referral to ENT for a bulging tympanic membrane, would like PSA checked today    HPI Jimmy Rice presents for follow-up regarding multiple items.  He has chronic problems including history of hypertension, GERD, osteoarthritis, adenocarcinoma prostate, history of benign renal cyst, chronic kidney disease stage III.  Recently seen at the [facility] over in [place].  He had ear irrigation for cerumen.  He states he was told that he had "bulging "of the left TM.  He has had some fullness of both ears.  He has some decline in hearing and actually plans to see audiologist over at the [facility] in July.  No ear pain.  No drainage.  Does have some chronic sinus congestion.  He thinks this may be allergy related.  Still followed by urology.  He had MRI of the prostate back in February.  This was reviewed.  He was recently placed on Gemtesa but has not seen any improvement in urinary urgency symptoms.  Patient requesting follow-up PSA today.  Chronic kidney disease.  Last labs on record were last winter and he had slight decline in kidney function at that time.  No regular nonsteroidal use.  Hypertension treated with multiple medications including hydralazine, metoprolol, losartan HCTZ.  Blood pressure stable.  He has hyperlipidemia treated with atorvastatin 20 mg daily.  Due for follow-up labs.  He did have liver panel in February which was normal  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   Complication of anesthesia    woke up "fighting"  confusion   GERD 01/18/2009    HYPERTENSION 01/18/2009   HYPOGONADISM 01/18/2009    Past Surgical History:  Procedure Laterality Date   COLONOSCOPY     INGUINAL HERNIA REPAIR Right [place]   PARTIAL KNEE ARTHROPLASTY Right 07/22/2016   Procedure: UNICOMPARTMENTAL KNEE ARTHROPLASTY;  Surgeon: Renette Butters, MD;  Location: [facility];  Service: Orthopedics;  Laterality: Right;   PROSTATE BIOPSY  X 5   REPLACEMENT UNICONDYLAR JOINT KNEE Right 07/22/2016   TRANSURETHRAL RESECTION OF PROSTATE  12/2014    Family History  Problem Relation Age of Onset   Breast cancer Mother    Kidney disease Father    Stomach cancer Paternal Aunt    Breast cancer Sister    Bladder Cancer Brother    Colon cancer Neg Hx    Esophageal cancer Neg Hx    Rectal cancer Neg Hx     Social History   Socioeconomic History   Marital status: Single    Spouse name: Not on file   Number of children: Not on file   Years of education: Not on file   Highest education level: Not on file  Occupational History   Not on file  Tobacco Use   Smoking status: Former    Pack years: 0.00    Types: Cigarettes   Smokeless tobacco: Never   Tobacco comments:    "quit smoking in the late 1960's; smoked < 1 pack  cigarettes TOTAL"  Vaping Use   Vaping Use: Never used  Substance and Sexual Activity   Alcohol use: Not Currently    Alcohol/week: 1.0 standard drink    Types: 1 Cans of beer per week    Comment: none   Drug use: No   Sexual activity: Yes  Other Topics Concern   Not on file  Social History Narrative   Retired from Retail buyer transportation   Right handed   One story home   Social Determinants of Health   Financial Resource Strain: Low Risk    Difficulty of Paying Living Expenses: Not hard at all  Food Insecurity: No Food Insecurity   Worried About Charity fundraiser in the Last Year: Never true   Arboriculturist in the Last Year: Never true  Transportation Needs: No Transportation Needs   Lack of  Transportation (Medical): No   Lack of Transportation (Non-Medical): No  Physical Activity: Inactive   Days of Exercise per Week: 0 days   Minutes of Exercise per Session: 0 min  Stress: No Stress Concern Present   Feeling of Stress : Not at all  Social Connections: Moderately Integrated   Frequency of Communication with Friends and Family: More than three times a week   Frequency of Social Gatherings with Friends and Family: More than three times a week   Attends Religious Services: Never   Marine scientist or Organizations: Yes   Attends Music therapist: More than 4 times per year   Marital Status: Married  Human resources officer Violence: Not At Risk   Fear of Current or Ex-Partner: No   Emotionally Abused: No   Physically Abused: No   Sexually Abused: No    Outpatient Medications Prior to Visit  Medication Sig Dispense Refill   atorvastatin (LIPITOR) 20 MG tablet TAKE 1 TABLET BY MOUTH EVERY DAY 90 tablet 1   hydrALAZINE (APRESOLINE) 25 MG tablet Take 1 tablet (25 mg total) by mouth 3 (three) times daily. 270 tablet 3   losartan-hydrochlorothiazide (HYZAAR) 100-12.5 MG tablet TAKE 1 TABLET DAILY 90 tablet 3   metoprolol succinate (TOPROL-XL) 50 MG 24 hr tablet Take 1 tablet (50 mg total) by mouth daily. Take with or immediately following a meal. 90 tablet 3   omeprazole (PRILOSEC) 40 MG capsule TAKE ONE CAPSULE BY MOUTH DAILY (TAKE ON AN EMPTY STOMACH 30 MINUTES PRIOR TO A MEAL)     Saw Palmetto 1000 MG CAPS TAKE 1 CAP/TAB BY MOUTH EVERY DAY     Vibegron (GEMTESA) 75 MG TABS Take 1 tablet by mouth daily.     No facility-administered medications prior to visit.    Allergies  Allergen Reactions   Amlodipine Hives   Aspirin Hives   Flomax [Tamsulosin Hcl] Other (See Comments)    DIFFICULTY BREATHING NASAL STUFFINESS   Tamsulosin Other (See Comments)    DIFFICULTY BREATHING NASAL STUFFINESS DIFFICULTY BREATHING NASAL STUFFINESS    ROS Review of Systems   Constitutional:  Negative for fatigue and unexpected weight change.  HENT:  Positive for hearing loss.   Eyes:  Negative for visual disturbance.  Respiratory:  Negative for cough, chest tightness and shortness of breath.   Cardiovascular:  Negative for chest pain, palpitations and leg swelling.  Genitourinary:  Positive for frequency and urgency.  Neurological:  Negative for dizziness, syncope, weakness, light-headedness and headaches.     Objective:    Physical Exam Vitals reviewed.  Constitutional:      Appearance:  Normal appearance.  HENT:     Ears:     Comments: TMs appear normal.  No cerumen.  No visible bulging.  No visible effusion. Cardiovascular:     Rate and Rhythm: Normal rate and regular rhythm.  Pulmonary:     Effort: Pulmonary effort is normal.     Breath sounds: Normal breath sounds.  Musculoskeletal:     Right lower leg: No edema.     Left lower leg: No edema.  Neurological:     Mental Status: He is alert.    BP 118/68 (BP Location: Left Arm, Patient Position: Sitting, Cuff Size: Normal)   Pulse 71   Temp 98.4 F (36.9 C) (Oral)   Wt 203 lb 3.2 oz (92.2 kg)   SpO2 98%   BMI 27.56 kg/m  Wt Readings from Last 3 Encounters:  04/03/21 203 lb 3.2 oz (92.2 kg)  12/06/20 200 lb (90.7 kg)  10/23/20 207 lb (93.9 kg)     Health Maintenance Due  Topic Date Due   Zoster Vaccines- Shingrix (1 of 2) Never done   COVID-19 Vaccine (4 - Booster for Moderna series) 11/20/2020    There are no preventive care reminders to display for this patient.  Lab Results  Component Value Date   TSH 1.99 06/04/2020   Lab Results  Component Value Date   WBC 3.4 (L) 11/30/2020   HGB 14.2 11/30/2020   HCT 43.4 11/30/2020   MCV 90.8 11/30/2020   PLT 152 11/30/2020   Lab Results  Component Value Date   NA 135 11/30/2020   K 3.6 11/30/2020   CO2 22 11/30/2020   GLUCOSE 135 (H) 11/30/2020   BUN 27 (H) 11/30/2020   CREATININE 2.26 (H) 11/30/2020   BILITOT 1.4 (H)  11/30/2020   ALKPHOS 61 11/30/2020   AST 23 11/30/2020   ALT 24 11/30/2020   PROT 8.4 (H) 11/30/2020   ALBUMIN 4.4 11/30/2020   CALCIUM 9.1 11/30/2020   ANIONGAP 9 11/30/2020   GFR 47.56 (L) 01/03/2020   Lab Results  Component Value Date   CHOL 139 04/29/2019   Lab Results  Component Value Date   HDL 55 06/04/2020   Lab Results  Component Value Date   LDLCALC 80 06/04/2020   Lab Results  Component Value Date   TRIG 86 06/04/2020   Lab Results  Component Value Date   CHOLHDL 3 04/29/2019   Lab Results  Component Value Date   HGBA1C 5.7 06/04/2020      Assessment & Plan:   #1 decline in hearing bilaterally.  No evidence for cerumen and no visible effusion on exam today.  He is encouraged to follow through with audiology assessment at the [facility] in July.  #2 hypertension stable and at goal -Continue current blood pressure medications as listed above.  #3 chronic kidney disease -Recheck basic metabolic panel -Avoid all non-steroidals  #4 history of elevated PSA with history of adenocarcinoma the prostate.  Patient requesting PSA. -We will check total and percent free PSA  #5 hyperlipidemia treated with atorvastatin -Recheck lipid panel.  Recent hepatic panel normal.     No orders of the defined types were placed in this encounter.   Follow-up: No follow-ups on file.    Carolann Littler, MD

## 2021-04-04 LAB — PSA, TOTAL AND FREE
PSA, % Free: 36 % (calc) (ref >25)
PSA, Free: 3.5 ng/mL
PSA, Total: 9.6 ng/mL — ABNORMAL HIGH (ref <=4.0)

## 2021-05-21 ENCOUNTER — Telehealth: Payer: Self-pay | Admitting: Family Medicine

## 2021-05-21 NOTE — Telephone Encounter (Signed)
Left message for patient to call back and schedule Medicare Annual Wellness Visit (AWV) either virtually or in office.  Left both  my jabber number [phone] and office number    Last AWV 06/07/20  please schedule at anytime with [facility] Nurse Health Advisor 1 or 2   This should be a 45 minute visit.

## 2021-06-11 ENCOUNTER — Other Ambulatory Visit: Payer: Self-pay

## 2021-06-11 ENCOUNTER — Ambulatory Visit (INDEPENDENT_AMBULATORY_CARE_PROVIDER_SITE_OTHER): Payer: Medicare (Managed Care)

## 2021-06-11 VITALS — BP 124/76 | HR 63 | Temp 98.4°F | Wt 204.9 lb

## 2021-06-11 DIAGNOSIS — Z0000 Encounter for general adult medical examination without abnormal findings: Secondary | ICD-10-CM | POA: Diagnosis not present

## 2021-06-11 NOTE — Patient Instructions (Signed)
Jimmy Rice , Thank you for taking time to come for your Medicare Wellness Visit. I appreciate your ongoing commitment to your health goals. Please review the following plan we discussed and let me know if I can assist you in the future.   Screening recommendations/referrals: Colonoscopy: Done 06/24/12 repeat every 10 years due 06/24/22 Recommended yearly ophthalmology/optometry visit for glaucoma screening and checkup Recommended yearly dental visit for hygiene and checkup  Vaccinations: Influenza vaccine: Due Pneumococcal vaccine: Completed  Tdap vaccine: Done 07/15/17 repeat every 10 years Due 07/16/27 Shingles vaccine: Shingrix discussed. Please contact your pharmacy for coverage information.    Covid-19: Completed 2/21, 3/24, & 08/20/20  Advanced directives: Please bring a copy of your health care power of attorney and living will to the office at your convenience.  Conditions/risks identified: get a handle on this kidney disease   Next appointment: Follow up in one year for your annual wellness visit.   Preventive Care 71 Years and Older, Male Preventive care refers to lifestyle choices and visits with your health care provider that can promote health and wellness. What does preventive care include? A yearly physical exam. This is also called an annual well check. Dental exams once or twice a year. Routine eye exams. Ask your health care provider how often you should have your eyes checked. Personal lifestyle choices, including: Daily care of your teeth and gums. Regular physical activity. Eating a healthy diet. Avoiding tobacco and drug use. Limiting alcohol use. Practicing safe sex. Taking low doses of aspirin every day. Taking vitamin and mineral supplements as recommended by your health care provider. What happens during an annual well check? The services and screenings done by your health care provider during your annual well check will depend on your age, overall health,  lifestyle risk factors, and family history of disease. Counseling  Your health care provider may ask you questions about your: Alcohol use. Tobacco use. Drug use. Emotional well-being. Home and relationship well-being. Sexual activity. Eating habits. History of falls. Memory and ability to understand (cognition). Work and work Statistician. Screening  You may have the following tests or measurements: Height, weight, and BMI. Blood pressure. Lipid and cholesterol levels. These may be checked every 5 years, or more frequently if you are over 48 years old. Skin check. Lung cancer screening. You may have this screening every year starting at age 62 if you have a 30-pack-year history of smoking and currently smoke or have quit within the past 15 years. Fecal occult blood test (FOBT) of the stool. You may have this test every year starting at age 30. Flexible sigmoidoscopy or colonoscopy. You may have a sigmoidoscopy every 5 years or a colonoscopy every 10 years starting at age 74. Prostate cancer screening. Recommendations will vary depending on your family history and other risks. Hepatitis C blood test. Hepatitis B blood test. Sexually transmitted disease (STD) testing. Diabetes screening. This is done by checking your blood sugar (glucose) after you have not eaten for a while (fasting). You may have this done every 1-3 years. Abdominal aortic aneurysm (AAA) screening. You may need this if you are a current or former smoker. Osteoporosis. You may be screened starting at age 74 if you are at high risk. Talk with your health care provider about your test results, treatment options, and if necessary, the need for more tests. Vaccines  Your health care provider may recommend certain vaccines, such as: Influenza vaccine. This is recommended every year. Tetanus, diphtheria, and acellular pertussis (Tdap, Td)  vaccine. You may need a Td booster every 10 years. Zoster vaccine. You may need this  after age 60. Pneumococcal 13-valent conjugate (PCV13) vaccine. One dose is recommended after age 74. Pneumococcal polysaccharide (PPSV23) vaccine. One dose is recommended after age 46. Talk to your health care provider about which screenings and vaccines you need and how often you need them. This information is not intended to replace advice given to you by your health care provider. Make sure you discuss any questions you have with your health care provider. Document Released: 10/19/2015 Document Revised: 06/11/2016 Document Reviewed: 07/24/2015 Elsevier Interactive Patient Education  2017 [employer] [employer] Prevention in the Home Falls can cause injuries. They can happen to people of all ages. There are many things you can do to make your home safe and to help prevent falls. What can I do on the outside of my home? Regularly fix the edges of walkways and driveways and fix any cracks. Remove anything that might make you trip as you walk through a door, such as a raised step or threshold. Trim any bushes or trees on the path to your home. Use bright outdoor lighting. Clear any walking paths of anything that might make someone trip, such as rocks or tools. Regularly check to see if handrails are loose or broken. Make sure that both sides of any steps have handrails. Any raised decks and porches should have guardrails on the edges. Have any leaves, snow, or ice cleared regularly. Use sand or salt on walking paths during winter. Clean up any spills in your garage right away. This includes oil or grease spills. What can I do in the bathroom? Use night lights. Install grab bars by the toilet and in the tub and shower. Do not use towel bars as grab bars. Use non-skid mats or decals in the tub or shower. If you need to sit down in the shower, use a plastic, non-slip stool. Keep the floor dry. Clean up any water that spills on the floor as soon as it happens. Remove soap buildup in the tub or  shower regularly. Attach bath mats securely with double-sided non-slip rug tape. Do not have throw rugs and other things on the floor that can make you trip. What can I do in the bedroom? Use night lights. Make sure that you have a light by your bed that is easy to reach. Do not use any sheets or blankets that are too big for your bed. They should not hang down onto the floor. Have a firm chair that has side arms. You can use this for support while you get dressed. Do not have throw rugs and other things on the floor that can make you trip. What can I do in the kitchen? Clean up any spills right away. Avoid walking on wet floors. Keep items that you use a lot in easy-to-reach places. If you need to reach something above you, use a strong step stool that has a grab bar. Keep electrical cords out of the way. Do not use floor polish or wax that makes floors slippery. If you must use wax, use non-skid floor wax. Do not have throw rugs and other things on the floor that can make you trip. What can I do with my stairs? Do not leave any items on the stairs. Make sure that there are handrails on both sides of the stairs and use them. Fix handrails that are broken or loose. Make sure that handrails are as long  as the stairways. Check any carpeting to make sure that it is firmly attached to the stairs. Fix any carpet that is loose or worn. Avoid having throw rugs at the top or bottom of the stairs. If you do have throw rugs, attach them to the floor with carpet tape. Make sure that you have a light switch at the top of the stairs and the bottom of the stairs. If you do not have them, ask someone to add them for you. What else can I do to help prevent falls? Wear shoes that: Do not have high heels. Have rubber bottoms. Are comfortable and fit you well. Are closed at the toe. Do not wear sandals. If you use a stepladder: Make sure that it is fully opened. Do not climb a closed stepladder. Make  sure that both sides of the stepladder are locked into place. Ask someone to hold it for you, if possible. Clearly mark and make sure that you can see: Any grab bars or handrails. First and last steps. Where the edge of each step is. Use tools that help you move around (mobility aids) if they are needed. These include: Canes. Walkers. Scooters. Crutches. Turn on the lights when you go into a dark area. Replace any light bulbs as soon as they burn out. Set up your furniture so you have a clear path. Avoid moving your furniture around. If any of your floors are uneven, fix them. If there are any pets around you, be aware of where they are. Review your medicines with your doctor. Some medicines can make you feel dizzy. This can increase your chance of falling. Ask your doctor what other things that you can do to help prevent falls. This information is not intended to replace advice given to you by your health care provider. Make sure you discuss any questions you have with your health care provider. Document Released: 07/19/2009 Document Revised: 02/28/2016 Document Reviewed: 10/27/2014 Elsevier Interactive Patient Education  2017 [employer].

## 2021-06-11 NOTE — Progress Notes (Addendum)
Subjective:   Jimmy Rice is a 75 y.o. male who presents for Medicare Annual/Subsequent preventive examination.  Review of Systems     Cardiac Risk Factors include: advanced age (>15men, >45 women);hypertension;male gender     Objective:    Today's Vitals   06/11/21 1015  BP: 124/76  Pulse: 63  Temp: 98.4 F (36.9 C)  SpO2: 99%  Weight: 204 lb 14.4 oz (92.9 kg)   Body mass index is 27.79 kg/m.  Advanced Directives 06/11/2021 11/30/2020 06/07/2020 09/15/2019 03/07/2019 02/23/2019 05/19/2018  Does Patient Have a Medical Advance Directive? Yes No Yes Yes Yes Yes Yes  Type of Advance Directive Living will - [place];Living will - - [place];Living will -  Does patient want to make changes to medical advance directive? - - No - Patient declined - - - -  Copy of [place] in Chart? - - No - copy requested - - - -    Current Medications (verified) Outpatient Encounter Medications as of 06/11/2021  Medication Sig   atorvastatin (LIPITOR) 20 MG tablet TAKE 1 TABLET BY MOUTH EVERY DAY   hydrALAZINE (APRESOLINE) 25 MG tablet Take 1 tablet (25 mg total) by mouth 3 (three) times daily.   losartan-hydrochlorothiazide (HYZAAR) 100-12.5 MG tablet TAKE 1 TABLET DAILY   metoprolol succinate (TOPROL-XL) 50 MG 24 hr tablet Take 1 tablet (50 mg total) by mouth daily. Take with or immediately following a meal.   omeprazole (PRILOSEC) 40 MG capsule TAKE ONE CAPSULE BY MOUTH DAILY (TAKE ON AN EMPTY STOMACH 30 MINUTES PRIOR TO A MEAL)   Saw Palmetto 1000 MG CAPS TAKE 1 CAP/TAB BY MOUTH EVERY DAY   Vibegron (GEMTESA) 75 MG TABS Take 1 tablet by mouth daily.   No facility-administered encounter medications on file as of 06/11/2021.    Allergies (verified) Amlodipine, Aspirin, Flomax [tamsulosin hcl], and Tamsulosin   History: Past Medical History:  Diagnosis Date   Arthritis    "right knee" (07/22/2016)   BENIGN PROSTATIC HYPERTROPHY, HX  OF 04/04/2009   Blood transfusion    Childhood asthma    Chronic kidney disease (CKD), stage III (moderate) (HCC)    cysts on kidneys, 3rd stage kidney disease   Complication of anesthesia    woke up "fighting"  confusion   GERD 01/18/2009   HYPERTENSION 01/18/2009   HYPOGONADISM 01/18/2009   Past Surgical History:  Procedure Laterality Date   COLONOSCOPY     INGUINAL HERNIA REPAIR Right [place]   PARTIAL KNEE ARTHROPLASTY Right 07/22/2016   Procedure: UNICOMPARTMENTAL KNEE ARTHROPLASTY;  Surgeon: Renette Butters, MD;  Location: [facility];  Service: Orthopedics;  Laterality: Right;   PROSTATE BIOPSY  X 5   REPLACEMENT UNICONDYLAR JOINT KNEE Right 07/22/2016   TRANSURETHRAL RESECTION OF PROSTATE  12/2014   Family History  Problem Relation Age of Onset   Breast cancer Mother    Kidney disease Father    Stomach cancer Paternal Aunt    Breast cancer Sister    Bladder Cancer Brother    Colon cancer Neg Hx    Esophageal cancer Neg Hx    Rectal cancer Neg Hx    Social History   Socioeconomic History   Marital status: Married    Spouse name: Not on file   Number of children: Not on file   Years of education: Not on file   Highest education level: Not on file  Occupational History  Not on file  Tobacco Use   Smoking status: Former    Types: Cigarettes   Smokeless tobacco: Never   Tobacco comments:    "quit smoking in the late 1960's; smoked < 1 pack cigarettes TOTAL"  Vaping Use   Vaping Use: Never used  Substance and Sexual Activity   Alcohol use: Not Currently    Alcohol/week: 1.0 standard drink    Types: 1 Cans of beer per week    Comment: none   Drug use: No   Sexual activity: Yes  Other Topics Concern   Not on file  Social History Narrative   Retired from Retail buyer transportation   Right handed   One story home   Social Determinants of Health   Financial Resource Strain: Low Risk    Difficulty of Paying Living Expenses: Not hard  at all  Food Insecurity: No Food Insecurity   Worried About Charity fundraiser in the Last Year: Never true   Arboriculturist in the Last Year: Never true  Transportation Needs: No Transportation Needs   Lack of Transportation (Medical): No   Lack of Transportation (Non-Medical): No  Physical Activity: Sufficiently Active   Days of Exercise per Week: 5 days   Minutes of Exercise per Session: 120 min  Stress: No Stress Concern Present   Feeling of Stress : Not at all  Social Connections: Socially Integrated   Frequency of Communication with Friends and Family: Twice a week   Frequency of Social Gatherings with Friends and Family: More than three times a week   Attends Religious Services: 1 to 4 times per year   Active Member of [employer] or Organizations: Yes   Attends Archivist Meetings: 1 to 4 times per year   Marital Status: Married    Tobacco Counseling Counseling given: Not Answered Tobacco comments: "quit smoking in the late 1960's; smoked < 1 pack cigarettes TOTAL"   Clinical Intake:  Pre-visit preparation completed: Yes  Pain : No/denies pain     BMI - recorded: 27.79 Nutritional Status: BMI 25 -29 Overweight Nutritional Risks: None Diabetes: No  How often do you need to have someone help you when you read instructions, pamphlets, or other written materials from your doctor or pharmacy?: 1 - Never  Diabetic?No  Interpreter Needed?: No  Information entered by :: Charlott Rakes, LPN   Activities of Daily Living In your present state of health, do you have any difficulty performing the following activities: 06/11/2021  Vision? N  Difficulty concentrating or making decisions? Y  Comment forget at times  Walking or climbing stairs? N  Dressing or bathing? N  Doing errands, shopping? N  Preparing Food and eating ? N  Using the Toilet? N  In the past six months, have you accidently leaked urine? Y  Comment leaking at times  Do you have problems with  loss of bowel control? N  Managing your Medications? N  Managing your Finances? N  Housekeeping or managing your Housekeeping? N  Some recent data might be hidden    Patient Care Team: Eulas Post, MD as PCP - General Wellington Hampshire, MD as PCP - Cardiology (Cardiology) Alda Berthold, DO as Consulting Physician (Neurology)  Indicate any recent Medical Services you may have received from other than [facility] providers in the past year (date may be approximate).     Assessment:   This is a routine wellness examination for [place].  Hearing/Vision screen Hearing Screening - Comments::  Denies any hearing issues  Vision Screening - Comments:: [facility]   Dietary issues and exercise activities discussed: Current Exercise Habits: Home exercise routine, Type of exercise: walking;Other - see comments (golfing), Time (Minutes): > 60, Frequency (Times/Week): 5, Weekly Exercise (Minutes/Week): 0   Goals Addressed             This Visit's Progress    Patient Stated       Get kidney disease under control        Depression Screen PHQ 2/9 Scores 06/11/2021 06/07/2020 11/09/2019 05/19/2018 05/19/2018 03/31/2017 01/14/2016  PHQ - 2 Score 0 0 0 0 0 0 0  PHQ- 9 Score - 0 0 - - - -    Fall Risk Fall Risk  06/11/2021 06/07/2020 09/15/2019 03/07/2019 05/19/2018  Falls in the past year? 0 0 0 0 No  Number falls in past yr: 0 0 0 - -  Injury with Fall? 0 0 0 - -  Risk for fall due to : Impaired vision Medication side effect - - -  Follow up Falls prevention discussed Falls evaluation completed;Falls prevention discussed - - -    FALL RISK PREVENTION PERTAINING TO THE HOME:  Any stairs in or around the home? No  If so, are there any without handrails? No  Home free of loose throw rugs in walkways, pet beds, electrical cords, etc? Yes  Adequate lighting in your home to reduce risk of falls? Yes   ASSISTIVE DEVICES UTILIZED TO PREVENT FALLS:  Life  alert? No  Use of a cane, walker or w/c? No  Grab bars in the bathroom? No  Shower chair or bench in shower? Yes  Elevated toilet seat or a handicapped toilet? No   TIMED UP AND GO:  Was the test performed? Yes .  Length of time to ambulate 10 feet: 10 sec.   Gait steady and fast without use of assistive device  Cognitive Function: MMSE - Mini Mental State Exam 05/19/2018  Not completed: (No Data)     6CIT Screen 06/11/2021 06/07/2020  What Year? 0 points 0 points  What month? 0 points 0 points  What time? 0 points 0 points  Count back from 20 0 points 0 points  Months in reverse 0 points 0 points  Repeat phrase 0 points 2 points  Total Score 0 2    Immunizations Immunization History  Administered Date(s) Administered   Fluad Quad(high Dose 65+) 06/07/2019   Influenza Split 06/17/2018   Influenza, High Dose Seasonal PF 06/18/2011, 07/10/2016, 06/17/2018, 06/26/2020   Influenza, Seasonal, Injecte, Preservative Fre 12/17/2015   Influenza,inj,Quad PF,6+ Mos 06/17/2013   Influenza-Unspecified 07/12/2001, 08/19/2001, 07/06/2005, 08/18/2006, 08/10/2007, 07/25/2008, 08/15/2009, 08/06/2010, 07/15/2012, 05/06/2013, 07/28/2014, 07/07/2015, 07/08/2016, 07/06/2017, 07/07/2019, 06/23/2020   Moderna Sars-Covid-2 Vaccination 11/27/2019, 12/28/2019, 08/20/2020   Pneumococcal Conjugate-13 08/14/2014, 05/08/2015, 07/15/2017   Pneumococcal Polysaccharide-23 02/10/2011, 07/07/2015   Pneumococcal-Unspecified 08/19/2001   Td 10/06/2005   Tdap 05/08/2015, 07/15/2017   Zoster, Live 05/12/2012    TDAP status: Up to date  Flu Vaccine status: Due, Education has been provided regarding the importance of this vaccine. Advised may receive this vaccine at local pharmacy or Health Dept. Aware to provide a copy of the vaccination record if obtained from local pharmacy or Health Dept. Verbalized acceptance and understanding.  Pneumococcal vaccine status: Up to date  Covid-19 vaccine status: Completed  vaccines  Qualifies for Shingles Vaccine? Yes   Zostavax completed Yes   Shingrix Completed?:  No.    Education has been provided regarding the importance of this vaccine. Patient has been advised to call insurance company to determine out of pocket expense if they have not yet received this vaccine. Advised may also receive vaccine at local pharmacy or Health Dept. Verbalized acceptance and understanding.  Screening Tests Health Maintenance  Topic Date Due   Zoster Vaccines- Shingrix (1 of 2) Never done   COVID-19 Vaccine (4 - Booster for Moderna series) 11/20/2020   INFLUENZA VACCINE  05/06/2021   COLONOSCOPY (Pts 45-22yrs Insurance coverage will need to be confirmed)  06/24/2022   TETANUS/TDAP  07/16/2027   Hepatitis C Screening  Completed   PNA vac Low Risk Adult  Completed   HPV VACCINES  Aged Out    Health Maintenance  Health Maintenance Due  Topic Date Due   Zoster Vaccines- Shingrix (1 of 2) Never done   COVID-19 Vaccine (4 - Booster for Moderna series) 11/20/2020   INFLUENZA VACCINE  05/06/2021    Colorectal cancer screening: Type of screening: Colonoscopy. Completed 06/24/12. Repeat every 10 years  Additional Screening:  Hepatitis C Screening:  Completed 07/15/17  Vision Screening: Recommended annual ophthalmology exams for early detection of glaucoma and other disorders of the eye. Is the patient up to date with their annual eye exam?  No  Who is the provider or what is the name of the office in which the patient attends annual eye exams? [facility] If pt is not established with a provider, would they like to be referred to a provider to establish care? No .   Dental Screening: Recommended annual dental exams for proper oral hygiene  Community Resource Referral / Chronic Care Management: CRR required this visit?  No   CCM required this visit?  No      Plan:     I have personally reviewed and noted the following in the patient's chart:    Medical and social history Use of alcohol, tobacco or illicit drugs  Current medications and supplements including opioid prescriptions. Patient is not currently taking opioid prescriptions. Functional ability and status Nutritional status Physical activity Advanced directives List of other physicians Hospitalizations, surgeries, and ER visits in previous 12 months Vitals Screenings to include cognitive, depression, and falls Referrals and appointments  In addition, I have reviewed and discussed with patient certain preventive protocols, quality metrics, and best practice recommendations. A written personalized care plan for preventive services as well as general preventive health recommendations were provided to patient.     Willette Brace, LPN   05/13/4165   Nurse Notes: Pt very [facility] stated it showed Increase creatine and decrease in GFR pt is requesting information on new Kidney drug he has seen on TV starting with the letter K and wants to know if this will help him with his kidney's pt is requesting you reach out to him to discuss. Please advise

## 2021-06-25 ENCOUNTER — Ambulatory Visit (INDEPENDENT_AMBULATORY_CARE_PROVIDER_SITE_OTHER): Payer: Medicare (Managed Care) | Admitting: Family Medicine

## 2021-06-25 ENCOUNTER — Other Ambulatory Visit: Payer: Self-pay

## 2021-06-25 ENCOUNTER — Encounter: Payer: Self-pay | Admitting: Family Medicine

## 2021-06-25 VITALS — BP 136/82 | HR 84 | Temp 98.3°F | Wt 202.8 lb

## 2021-06-25 DIAGNOSIS — C61 Malignant neoplasm of prostate: Secondary | ICD-10-CM

## 2021-06-25 DIAGNOSIS — N1832 Chronic kidney disease, stage 3b: Secondary | ICD-10-CM | POA: Diagnosis not present

## 2021-06-25 NOTE — Progress Notes (Signed)
Established Patient Office Visit  Subjective:  Patient ID: Jimmy Rice, male    DOB: Oct 30, 1945  Age: 75 y.o. MRN: [id number]  CC:  Chief Complaint  Patient presents with   Follow-up    HPI Jimmy Rice presents to discuss a couple of items.  He specifically would like referrals to urology and nephrology.  He has history of adenocarcinoma prostate.  He was followed initially by urologist in [place] who retired and then sought care in [place] and was followed there for a few years.  Was being monitored observantly.  He has not had any treatments for his prostate cancer such as surgery or radiation or hormonal therapy.  He would like to reestablish with a local urologist.  He has family history of polycystic kidney disease in his father.  Patient has chronic kidney disease stage III.  He avoids nonsteroidals.  He has no history of diabetes.  He does have history of hypertension which has been fairly well controlled.  Patient had CT scan 2018 which showed multiple cysts in both kidneys.  He currently does not see nephrologist.  He had some recent lab work at the [place] and had GFR less than 30.  This was the first time this has been below 30.  He has been in the 30-40 range for some time.  No history of anemia by recent labs.  He specifically had questions about medication needs seen advertised called Corky Sox is approved to slow progression of kidney disease and type II diabetics.  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   Complication of anesthesia    woke up "fighting"  confusion   GERD 01/18/2009   HYPERTENSION 01/18/2009   HYPOGONADISM 01/18/2009    Past Surgical History:  Procedure Laterality Date   COLONOSCOPY     INGUINAL HERNIA  REPAIR Right [place]   PARTIAL KNEE ARTHROPLASTY Right 07/22/2016   Procedure: UNICOMPARTMENTAL KNEE ARTHROPLASTY;  Surgeon: Renette Butters, MD;  Location: [facility];  Service: Orthopedics;  Laterality: Right;   PROSTATE BIOPSY  X 5   REPLACEMENT UNICONDYLAR JOINT KNEE Right 07/22/2016   TRANSURETHRAL RESECTION OF PROSTATE  12/2014    Family History  Problem Relation Age of Onset   Breast cancer Mother    Kidney disease Father    Stomach cancer Paternal Aunt    Breast cancer Sister    Bladder Cancer Brother    Colon cancer Neg Hx    Esophageal cancer Neg Hx    Rectal cancer Neg Hx     Social History   Socioeconomic History   Marital status: Married    Spouse name: Not on file   Number of children: Not on file   Years of education: Not on file   Highest education level: Not on file  Occupational History   Not on file  Tobacco Use   Smoking status: Former    Types: Cigarettes   Smokeless tobacco: Never   Tobacco comments:    "quit smoking in the late 1960's; smoked < 1 pack cigarettes TOTAL"  Vaping Use   Vaping Use: Never used  Substance and Sexual Activity   Alcohol use: Not Currently  Alcohol/week: 1.0 standard drink    Types: 1 Cans of beer per week    Comment: none   Drug use: No   Sexual activity: Yes  Other Topics Concern   Not on file  Social History Narrative   Retired from Retail buyer transportation   Right handed   One story home   Social Determinants of Health   Financial Resource Strain: Low Risk    Difficulty of Paying Living Expenses: Not hard at all  Food Insecurity: No Food Insecurity   Worried About Charity fundraiser in the Last Year: Never true   Arboriculturist in the Last Year: Never true  Transportation Needs: No Transportation Needs   Lack of Transportation (Medical): No   Lack of Transportation (Non-Medical): No  Physical Activity: Sufficiently Active   Days of Exercise per Week: 5 days   Minutes of  Exercise per Session: 120 min  Stress: No Stress Concern Present   Feeling of Stress : Not at all  Social Connections: Socially Integrated   Frequency of Communication with Friends and Family: Twice a week   Frequency of Social Gatherings with Friends and Family: More than three times a week   Attends Religious Services: 1 to 4 times per year   Active Member of [employer] or Organizations: Yes   Attends Archivist Meetings: 1 to 4 times per year   Marital Status: Married  Human resources officer Violence: Not At Risk   Fear of Current or Ex-Partner: No   Emotionally Abused: No   Physically Abused: No   Sexually Abused: No    Outpatient Medications Prior to Visit  Medication Sig Dispense Refill   atorvastatin (LIPITOR) 20 MG tablet TAKE 1 TABLET BY MOUTH EVERY DAY 90 tablet 1   losartan-hydrochlorothiazide (HYZAAR) 100-12.5 MG tablet TAKE 1 TABLET DAILY 90 tablet 3   metoprolol succinate (TOPROL-XL) 50 MG 24 hr tablet Take 1 tablet (50 mg total) by mouth daily. Take with or immediately following a meal. 90 tablet 3   omeprazole (PRILOSEC) 40 MG capsule TAKE ONE CAPSULE BY MOUTH DAILY (TAKE ON AN EMPTY STOMACH 30 MINUTES PRIOR TO A MEAL)     Saw Palmetto 1000 MG CAPS TAKE 1 CAP/TAB BY MOUTH EVERY DAY     hydrALAZINE (APRESOLINE) 25 MG tablet Take 1 tablet (25 mg total) by mouth 3 (three) times daily. (Patient not taking: Reported on 06/25/2021) 270 tablet 3   Vibegron (GEMTESA) 75 MG TABS Take 1 tablet by mouth daily. (Patient not taking: Reported on 06/25/2021)     No facility-administered medications prior to visit.    Allergies  Allergen Reactions   Amlodipine Hives   Aspirin Hives   Flomax [Tamsulosin Hcl] Other (See Comments)    DIFFICULTY BREATHING NASAL STUFFINESS   Tamsulosin Other (See Comments)    DIFFICULTY BREATHING NASAL STUFFINESS DIFFICULTY BREATHING NASAL STUFFINESS    ROS Review of Systems  Constitutional:  Negative for fatigue.  Eyes:  Negative for visual  disturbance.  Respiratory:  Negative for cough, chest tightness and shortness of breath.   Cardiovascular:  Negative for chest pain, palpitations and leg swelling.  Neurological:  Negative for dizziness, syncope, weakness, light-headedness and headaches.     Objective:    Physical Exam Constitutional:      Appearance: He is well-developed.  Neck:     Thyroid: No thyromegaly.  Cardiovascular:     Rate and Rhythm: Normal rate and regular rhythm.  Pulmonary:     Effort:  Pulmonary effort is normal. No respiratory distress.     Breath sounds: Normal breath sounds. No wheezing or rales.  Musculoskeletal:     Cervical back: Neck supple.     Right lower leg: No edema.     Left lower leg: No edema.  Neurological:     Mental Status: He is alert and oriented to person, place, and time.    BP 136/82 (BP Location: Left Arm, Patient Position: Sitting, Cuff Size: Normal)   Pulse 84   Temp 98.3 F (36.8 C) (Oral)   Wt 202 lb 12.8 oz (92 kg)   SpO2 96%   BMI 27.50 kg/m  Wt Readings from Last 3 Encounters:  06/25/21 202 lb 12.8 oz (92 kg)  06/11/21 204 lb 14.4 oz (92.9 kg)  04/03/21 203 lb 3.2 oz (92.2 kg)     Health Maintenance Due  Topic Date Due   Zoster Vaccines- Shingrix (1 of 2) Never done   COVID-19 Vaccine (4 - Booster for Moderna series) 11/12/2020   INFLUENZA VACCINE  05/06/2021    There are no preventive care reminders to display for this patient.  Lab Results  Component Value Date   TSH 1.99 06/04/2020   Lab Results  Component Value Date   WBC 3.4 (L) 11/30/2020   HGB 14.2 11/30/2020   HCT 43.4 11/30/2020   MCV 90.8 11/30/2020   PLT 152 11/30/2020   Lab Results  Component Value Date   NA 141 04/03/2021   K 4.1 04/03/2021   CO2 29 04/03/2021   GLUCOSE 118 (H) 04/03/2021   BUN 21 04/03/2021   CREATININE 1.88 (H) 04/03/2021   BILITOT 1.4 (H) 11/30/2020   ALKPHOS 61 11/30/2020   AST 23 11/30/2020   ALT 24 11/30/2020   PROT 8.4 (H) 11/30/2020   ALBUMIN  4.4 11/30/2020   CALCIUM 9.6 04/03/2021   ANIONGAP 9 11/30/2020   GFR 34.53 (L) 04/03/2021   Lab Results  Component Value Date   CHOL 148 04/03/2021   Lab Results  Component Value Date   HDL 41.30 04/03/2021   Lab Results  Component Value Date   LDLCALC 74 04/03/2021   Lab Results  Component Value Date   TRIG 159.0 (H) 04/03/2021   Lab Results  Component Value Date   CHOLHDL 4 04/03/2021   Lab Results  Component Value Date   HGBA1C 5.7 06/04/2020      Assessment & Plan:   #1 history of adenocarcinoma prostate.  Has been followed observantly by urologist in [place].  Patient requesting referral to urologist locally.  We will go ahead and set this up.  His recent total PSA here 9.6 with percent free 36  #2 chronic kidney disease stage IIIb.  Reported positive family history of polycystic kidney disease.  -Set up nephrology referral -Continue to avoid non-steroidals -We discussed Kerendia-explained that this medication is approved for type II diabetics with chronic kidney disease.  Patient has no known history of diabetes.   No orders of the defined types were placed in this encounter.   Follow-up: No follow-ups on file.    Carolann Littler, MD

## 2021-08-07 ENCOUNTER — Other Ambulatory Visit: Payer: Self-pay

## 2021-08-07 ENCOUNTER — Emergency Department: Payer: Medicare (Managed Care)

## 2021-08-07 ENCOUNTER — Emergency Department
Admission: EM | Admit: 2021-08-07 | Discharge: 2021-08-07 | Disposition: A | Discharge status: Home / Self Care | Payer: Medicare (Managed Care) | Attending: Emergency Medicine | Admitting: Emergency Medicine

## 2021-08-07 DIAGNOSIS — N183 Chronic kidney disease, stage 3 unspecified: Secondary | ICD-10-CM | POA: Insufficient documentation

## 2021-08-07 DIAGNOSIS — I129 Hypertensive chronic kidney disease with stage 1 through stage 4 chronic kidney disease, or unspecified chronic kidney disease: Secondary | ICD-10-CM | POA: Insufficient documentation

## 2021-08-07 DIAGNOSIS — R42 Dizziness and giddiness: Secondary | ICD-10-CM | POA: Insufficient documentation

## 2021-08-07 DIAGNOSIS — Z20822 Contact with and (suspected) exposure to covid-19: Secondary | ICD-10-CM | POA: Insufficient documentation

## 2021-08-07 DIAGNOSIS — Z87891 Personal history of nicotine dependence: Secondary | ICD-10-CM | POA: Insufficient documentation

## 2021-08-07 DIAGNOSIS — J45909 Unspecified asthma, uncomplicated: Secondary | ICD-10-CM | POA: Insufficient documentation

## 2021-08-07 DIAGNOSIS — Z8546 Personal history of malignant neoplasm of prostate: Secondary | ICD-10-CM | POA: Insufficient documentation

## 2021-08-07 DIAGNOSIS — Z96651 Presence of right artificial knee joint: Secondary | ICD-10-CM | POA: Diagnosis not present

## 2021-08-07 DIAGNOSIS — J069 Acute upper respiratory infection, unspecified: Secondary | ICD-10-CM | POA: Insufficient documentation

## 2021-08-07 DIAGNOSIS — Z79899 Other long term (current) drug therapy: Secondary | ICD-10-CM | POA: Diagnosis not present

## 2021-08-07 DIAGNOSIS — R059 Cough, unspecified: Secondary | ICD-10-CM | POA: Diagnosis present

## 2021-08-07 LAB — BASIC METABOLIC PANEL
Anion gap: 8 (ref 5–15)
BUN: 25 mg/dL — ABNORMAL HIGH (ref 8–23)
CO2: 28 mmol/L (ref 22–32)
Calcium: 9.2 mg/dL (ref 8.9–10.3)
Chloride: 102 mmol/L (ref 98–111)
Creatinine, Ser: 1.67 mg/dL — ABNORMAL HIGH (ref 0.61–1.24)
GFR, Estimated: 42 mL/min — ABNORMAL LOW (ref >60)
Glucose, Bld: 134 mg/dL — ABNORMAL HIGH (ref 70–99)
Potassium: 3.4 mmol/L — ABNORMAL LOW (ref 3.5–5.1)
Sodium: 138 mmol/L (ref 135–145)

## 2021-08-07 LAB — CBC WITH DIFFERENTIAL/PLATELET
Abs Immature Granulocytes: 0.01 10*3/uL (ref 0.00–0.07)
Basophils Absolute: 0 10*3/uL (ref 0.0–0.1)
Basophils Relative: 1 %
Eosinophils Absolute: 0.3 10*3/uL (ref 0.0–0.5)
Eosinophils Relative: 6 %
HCT: 41.5 % (ref 39.0–52.0)
Hemoglobin: 13.4 g/dL (ref 13.0–17.0)
Immature Granulocytes: 0 %
Lymphocytes Relative: 30 %
Lymphs Abs: 1.7 10*3/uL (ref 0.7–4.0)
MCH: 29.6 pg (ref 26.0–34.0)
MCHC: 32.3 g/dL (ref 30.0–36.0)
MCV: 91.8 fL (ref 80.0–100.0)
Monocytes Absolute: 0.6 10*3/uL (ref 0.1–1.0)
Monocytes Relative: 11 %
Neutro Abs: 3 10*3/uL (ref 1.7–7.7)
Neutrophils Relative %: 52 %
Platelets: 207 10*3/uL (ref 150–400)
RBC: 4.52 MIL/uL (ref 4.22–5.81)
RDW: 12.3 % (ref 11.5–15.5)
WBC: 5.6 10*3/uL (ref 4.0–10.5)
nRBC: 0 % (ref 0.0–0.2)

## 2021-08-07 LAB — RESP PANEL BY RT-PCR (FLU A&B, COVID) ARPGX2
Influenza A by PCR: NEGATIVE
Influenza B by PCR: NEGATIVE
SARS Coronavirus 2 by RT PCR: NEGATIVE

## 2021-08-07 LAB — TROPONIN I (HIGH SENSITIVITY): Troponin I (High Sensitivity): 6 ng/L (ref <18)

## 2021-08-07 IMAGING — CR DG CHEST 2V
2 series · 2 of 2 positions shown · non-contrast
Comparison: [DATE]

CLINICAL DATA: Cough 2 weeks

EXAM:
CHEST - 2 VIEW

[chest pa]
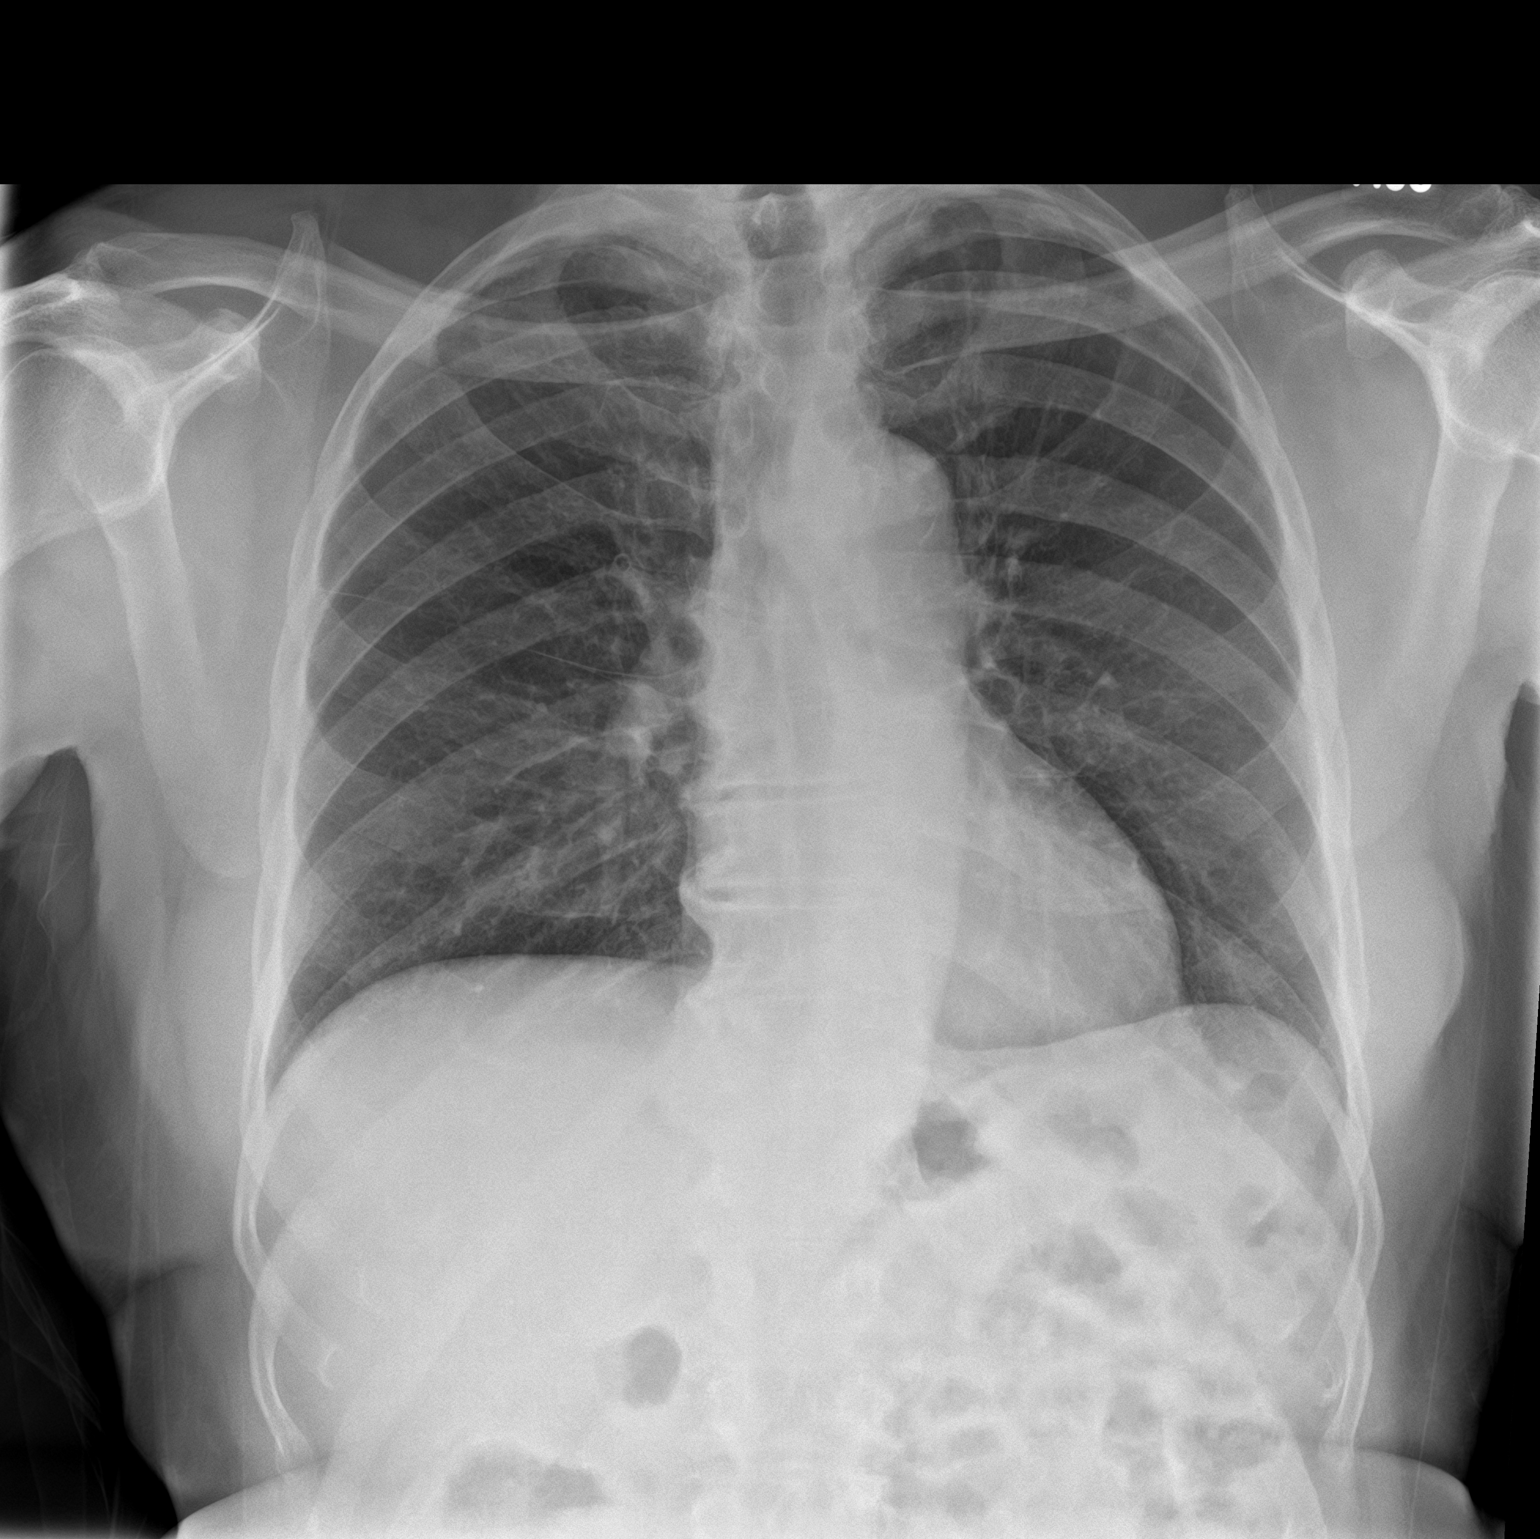

[chest lat]
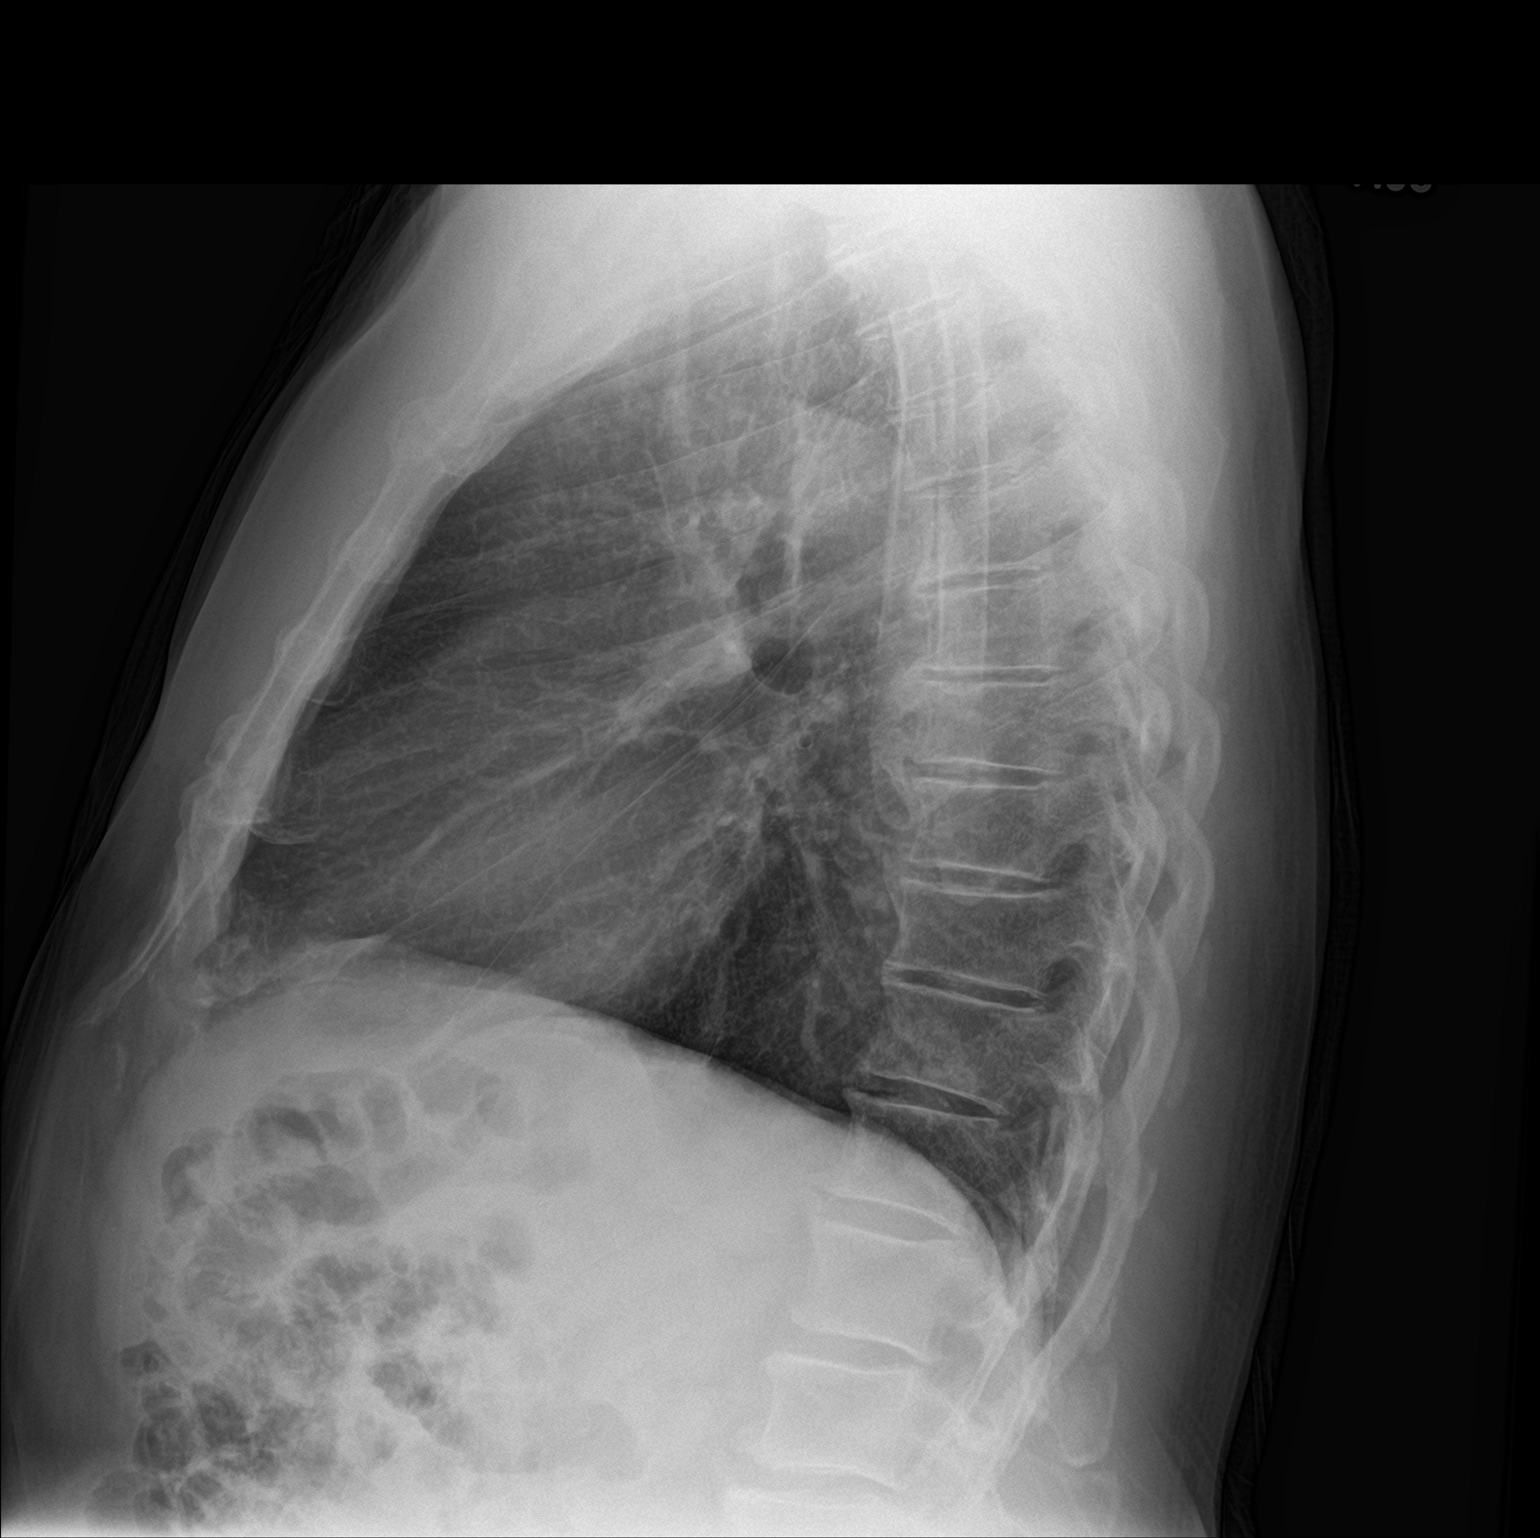

[2 of 2 positions shown; findings below may reference images not displayed]

FINDINGS: The heart size and mediastinal contours are within normal limits.
Both lungs are clear. The visualized skeletal structures are
unremarkable.
IMPRESSION: No active cardiopulmonary disease.

## 2021-08-07 NOTE — ED Provider Notes (Signed)
[facility] Emergency Department Provider Note  ____________________________________________   Event Date/Time   First MD Initiated Contact with Patient 08/07/21 1554     (approximate)  I have reviewed the triage vital signs and the nursing notes.   HISTORY  Chief Complaint Cough  HPI Jimmy Rice is a 75 y.o. male with the below medical history including CKD, prostate cancer, and hypertension, presents to the ED for evaluation of 2.5 weeks of cough and cold symptoms.  Patient also reported some mild dizziness but denies any head injury, weakness, or syncope.  Patient recently relocated to the area from [place].  He denies any sick contacts, nausea, vomiting, chest pain, abdominal pain, or diarrhea.  He has not taken any medications over-the-counter for symptom relief.  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   Complication of anesthesia    woke up "fighting"  confusion   GERD 01/18/2009   HYPERTENSION 01/18/2009   HYPOGONADISM 01/18/2009    Patient Active Problem List   Diagnosis Date Noted   History of cerebrovascular accident (CVA) involving cerebellum 03/01/2019   Bilateral renal cysts 08/20/2017   Adenocarcinoma of prostate ([place]) 03/31/2017   Primary osteoarthritis of right knee 06/11/2016   Increased frequency of urination 01/11/2014   Nocturia 01/11/2014   Acute constipation 12/20/2013   CKD (chronic kidney disease) stage 3, GFR 30-59 ml/min ([place]) 12/02/2012   Elevated PSA 12/27/2010   CHRONIC RHINITIS 10/09/2010   COUGH 10/09/2010   ALLERGIC RHINITIS 09/03/2010   BPH (benign prostatic hyperplasia) 04/04/2009   Low testosterone 01/18/2009   Essential hypertension 01/18/2009   Asthma 01/18/2009    GERD 01/18/2009    Past Surgical History:  Procedure Laterality Date   COLONOSCOPY     INGUINAL HERNIA REPAIR Right [place]   PARTIAL KNEE ARTHROPLASTY Right 07/22/2016   Procedure: UNICOMPARTMENTAL KNEE ARTHROPLASTY;  Surgeon: Renette Butters, MD;  Location: [facility];  Service: Orthopedics;  Laterality: Right;   PROSTATE BIOPSY  X 5   REPLACEMENT UNICONDYLAR JOINT KNEE Right 07/22/2016   TRANSURETHRAL RESECTION OF PROSTATE  12/2014    Prior to Admission medications   Medication Sig Start Date End Date Taking? Authorizing Provider  atorvastatin (LIPITOR) 20 MG tablet TAKE 1 TABLET BY MOUTH EVERY DAY 03/25/21   Burchette, Alinda Sierras, MD  losartan-hydrochlorothiazide [facility]) 100-12.5 MG tablet TAKE 1 TABLET DAILY 01/16/21   Burchette, Alinda Sierras, MD  metoprolol succinate (TOPROL-XL) 50 MG 24 hr tablet Take 1 tablet (50 mg total) by mouth daily. Take with or immediately following a meal. 10/17/20   Burchette, Alinda Sierras, MD  omeprazole (PRILOSEC) 40 MG capsule TAKE ONE CAPSULE BY MOUTH DAILY (TAKE ON AN EMPTY STOMACH 30 MINUTES PRIOR TO A MEAL) 08/01/20   [provider]  Saw Palmetto 1000 MG CAPS TAKE 1 CAP/TAB BY MOUTH EVERY DAY 02/19/09   [provider]    Allergies Amlodipine, Aspirin, Flomax [tamsulosin hcl], and Tamsulosin  Family History  Problem Relation Age of Onset   Breast cancer Mother    Kidney disease Father    Stomach cancer Paternal Aunt    Breast cancer Sister    Bladder Cancer Brother    Colon cancer  Neg Hx    Esophageal cancer Neg Hx    Rectal cancer Neg Hx     Social History Social History   Tobacco Use   Smoking status: Former    Types: Cigarettes   Smokeless tobacco: Never   Tobacco comments:    "quit smoking in the late 1960's; smoked < 1 pack cigarettes TOTAL"  Vaping Use   Vaping Use: Never used  Substance Use Topics   Alcohol use: Not Currently    Alcohol/week: 1.0 standard drink    Types: 1 Cans of beer per  week    Comment: none   Drug use: No    Review of Systems  Constitutional: No fever/chills Eyes: No visual changes. ENT: No sore throat. Cardiovascular: Denies chest pain. Respiratory: Denies shortness of breath. Gastrointestinal: No abdominal pain.  No nausea, no vomiting.  No diarrhea.  No constipation. Genitourinary: Negative for dysuria. Musculoskeletal: Negative for back pain. Skin: Negative for rash. Neurological: Negative for headaches, focal weakness or numbness. ____________________________________________   PHYSICAL EXAM:  VITAL SIGNS: ED Triage Vitals [08/07/21 1159]  Enc Vitals Group     BP      Pulse      Resp      Temp      Temp src      SpO2      Weight      Height      Head Circumference      Peak Flow      Pain Score 3     Pain Loc      Pain Edu?      Excl. in [place]?     Constitutional: Alert and oriented. Well appearing and in no acute distress. Eyes: Conjunctivae are normal. PERRL. EOMI. Head: Atraumatic. Nose: No congestion/rhinnorhea. Mouth/Throat: Mucous membranes are moist.  Oropharynx non-erythematous. Neck: No stridor.   Cardiovascular: Normal rate, regular rhythm. Grossly normal heart sounds.  Good peripheral circulation. Respiratory: Normal respiratory effort.  No retractions. Lungs CTAB. Gastrointestinal: Soft and nontender. No distention. No abdominal bruits. No CVA tenderness. Musculoskeletal: No lower extremity tenderness nor edema.  No joint effusions. Neurologic:  Normal speech and language. No gross focal neurologic deficits are appreciated. No gait instability. Skin:  Skin is warm, dry and intact. No rash noted. Psychiatric: Mood and affect are normal. Speech and behavior are normal.  ____________________________________________   LABS (all labs ordered are listed, but only abnormal results are displayed)  Labs Reviewed  BASIC METABOLIC PANEL - Abnormal; Notable for the following components:      Result Value   Potassium  3.4 (*)    Glucose, Bld 134 (*)    BUN 25 (*)    Creatinine, Ser 1.67 (*)    GFR, Estimated 42 (*)    All other components within normal limits  RESP PANEL BY RT-PCR (FLU A&B, COVID) ARPGX2  CBC WITH DIFFERENTIAL/PLATELET  TROPONIN I (HIGH SENSITIVITY)   ____________________________________________  EKG  ____________________________________________  RADIOLOGY I, Melvenia Needles, personally viewed and evaluated these images (plain radiographs) as part of my medical decision making, as well as reviewing the written report by the radiologist.  ED MD interpretation:  agree with report  Official radiology report(s): DG Chest 2 View  Result Date: 08/07/2021 CLINICAL DATA:  Cough 2 weeks EXAM: CHEST - 2 VIEW COMPARISON:  02/23/2019 FINDINGS: The heart size and mediastinal contours are within normal limits. Both lungs are clear. The visualized skeletal structures are unremarkable. IMPRESSION: No active cardiopulmonary disease. Electronically Signed  By: Franchot Gallo M.D.   On: 08/07/2021 17:15    ____________________________________________   PROCEDURES  Procedure(s) performed (including Critical Care):  Procedures   ____________________________________________   INITIAL IMPRESSION / ASSESSMENT AND PLAN / ED COURSE  As part of my medical decision making, I reviewed the following data within the [place] reviewed as noted, Radiograph reviewed NAD, and Notes from prior ED visits  DDX: CAP, ACS, viral URI, Covid, influenza    Geriatric patient ED evaluation of 2 weeks of cough and congestion.  Patient evaluated for his complaints, found to have a reassuring exam overall.  No signs of hypoxia, tachycardia, or fever.  Chest x-ray does not reveal any acute intrathoracic process, and labs are reassuring without signs of troponin abnormality electrolyte abnormality, or anemia.  Patient's CKD is stable at this time.  Viral panel screen is negative for  any viral etiology.  Patient overall is reassured by the work-up, and is inclined to discharge with instructions to follow-up his PCP and treat symptoms with over-the-counter medications.  Return precautions have been discussed. ____________________________________________   FINAL CLINICAL IMPRESSION(S) / ED DIAGNOSES  Final diagnoses:  Viral URI with cough     ED Discharge Orders     None        Note:  This document was prepared using Dragon voice recognition software and may include unintentional dictation errors.    Melvenia Needles, PA-C 08/07/21 1800    Nena Polio, MD 08/07/21 [phone]

## 2021-08-07 NOTE — Discharge Instructions (Signed)
Your exam, labs, chest x-ray, and viral panel test are all negative and reassuring at this time.  No signs of COVID, for her influenza.  Your chronic kidney disease remained stable.  Continue to rest, hydrate, take over-the-counter cough medicine as needed.  Follow-up with primary provider return to the ED if needed.

## 2021-08-07 NOTE — ED Triage Notes (Signed)
Pt comes with c/o cough and cold like symptoms for several weeks. Pt denies any recent covid or flu tests taken. Pt recently moved here from [place].

## 2021-08-07 NOTE — ED Notes (Signed)
D/C and reasons to return to ED discussed with pt, pt verbalized understanding. NAD noted at this time. Pt ambulatory on D/C with steady gait.

## 2021-09-11 ENCOUNTER — Other Ambulatory Visit: Payer: Self-pay

## 2021-09-11 ENCOUNTER — Ambulatory Visit (INDEPENDENT_AMBULATORY_CARE_PROVIDER_SITE_OTHER): Payer: Medicare (Managed Care) | Admitting: Cardiovascular Disease

## 2021-09-11 ENCOUNTER — Encounter: Payer: Self-pay | Admitting: Cardiovascular Disease

## 2021-09-11 VITALS — BP 126/80 | HR 65 | Ht 72.0 in | Wt 203.8 lb

## 2021-09-11 DIAGNOSIS — I493 Ventricular premature depolarization: Secondary | ICD-10-CM

## 2021-09-11 DIAGNOSIS — E782 Mixed hyperlipidemia: Secondary | ICD-10-CM

## 2021-09-11 DIAGNOSIS — I10 Essential (primary) hypertension: Secondary | ICD-10-CM | POA: Diagnosis not present

## 2021-09-11 NOTE — Progress Notes (Signed)
Cardiology Office Note   Date:  09/11/2021   ID:  Jimmy Rice, DOB 25-Sep-1946, MRN [id number]  PCP:  Jimmy Post, MD  Cardiologist:   Jimmy Sacramento, MD   No chief complaint on file.     History of Present Illness: Jimmy Rice is a 75 y.o. male who is here today for follow-up visit regarding symptomatic PVCs.  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 Previous cardiac work-up including stress testing and echocardiogram were unremarkable.  PVCs are being treated with Toprol with excellent control.   He has been doing well with no chest pain, shortness of breath or palpitations.  No syncope or presyncope.  He is very active and exercises on a regular basis with no limitations.  He he enjoys traveling and has 2 upcoming trips to [place] and [place].   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   Complication of anesthesia    woke up "fighting"  confusion   GERD 01/18/2009   HYPERTENSION 01/18/2009   HYPOGONADISM 01/18/2009    Past Surgical History:  Procedure Laterality Date   COLONOSCOPY     INGUINAL HERNIA REPAIR Right [place]   PARTIAL KNEE ARTHROPLASTY Right 07/22/2016   Procedure: UNICOMPARTMENTAL KNEE ARTHROPLASTY;  Surgeon: Jimmy Butters, MD;  Location: [facility] [place];  Service: Orthopedics;  Laterality: Right;   PROSTATE BIOPSY  X 5   REPLACEMENT UNICONDYLAR JOINT KNEE Right 07/22/2016   TRANSURETHRAL RESECTION OF PROSTATE  12/2014     Current Outpatient Medications  Medication Sig Dispense Refill   atorvastatin (LIPITOR) 20 MG tablet TAKE 1 TABLET BY MOUTH EVERY DAY 90 tablet  1   losartan-hydrochlorothiazide (HYZAAR) 100-12.5 MG tablet TAKE 1 TABLET DAILY 90 tablet 3   metoprolol succinate (TOPROL-XL) 50 MG 24 hr tablet Take 1 tablet (50 mg total) by mouth daily. Take with or immediately following a meal. 90 tablet 3   omeprazole (PRILOSEC) 40 MG capsule as needed.     Saw Palmetto 1000 MG CAPS TAKE 1 CAP/TAB BY MOUTH EVERY DAY (Patient not taking: Reported on 09/11/2021)     No current facility-administered medications for this visit.    Allergies:   Amlodipine, Flomax [tamsulosin hcl], and Tamsulosin    Social History:  The patient  reports that he has quit smoking. His smoking use included cigarettes. He has never used smokeless tobacco. He reports that he does not currently use alcohol after a past usage of about 1.0 standard drink per week. He reports that he does not use drugs.   Family History:  The patient's family history includes Bladder Cancer in his brother; Breast cancer in his mother and sister; Kidney disease in his father; Stomach cancer in his paternal aunt.    ROS:  Please see the history of present illness.   Otherwise, review of systems are positive for none.   All other systems are reviewed and negative.    PHYSICAL EXAM: VS:  BP 126/80   Pulse 65   Ht 6' (1.829 m)   Wt 203 lb 12.8 oz (92.4 kg)   SpO2  98%   BMI 27.64 kg/m  , BMI Body mass index is 27.64 kg/m. GEN: Well nourished, well developed, in no acute distress  HEENT: normal  Neck: no JVD, carotid bruits, or masses Cardiac: RRR; no murmurs, rubs, or gallops,no edema  Respiratory:  clear to auscultation bilaterally, normal work of breathing GI: soft, nontender, nondistended, + BS MS: no deformity or atrophy  Skin: warm and dry, no rash Neuro:  Strength and sensation are intact Psych: euthymic mood, full affect   EKG:  EKG is ordered today. The ekg ordered today demonstrates normal sinus rhythm with no significant ST or T wave changes.  No PVCs.  Minimal LVH.   Recent  Labs: 11/30/2020: ALT 24 08/07/2021: BUN 25; Creatinine, Ser 1.67; Hemoglobin 13.4; Platelets 207; Potassium 3.4; Sodium 138    Lipid Panel    Component Value Date/Time   CHOL 148 04/03/2021 1341   TRIG 159.0 (H) 04/03/2021 1341   HDL 41.30 04/03/2021 1341   CHOLHDL 4 04/03/2021 1341   VLDL 31.8 04/03/2021 1341   LDLCALC 74 04/03/2021 1341   LDLDIRECT 106.0 05/19/2018 1510      Wt Readings from Last 3 Encounters:  09/11/21 203 lb 12.8 oz (92.4 kg)  06/25/21 202 lb 12.8 oz (92 kg)  06/11/21 204 lb 14.4 oz (92.9 kg)     PAD Screen 07/14/2019  Previous PAD dx? No  Previous surgical procedure? No  Pain with walking? No  Feet/toe relief with dangling? No  Painful, non-healing ulcers? No  Extremities discolored? No      ASSESSMENT AND PLAN:  1.  Symptomatic PVCs: Symptoms are  well controlled on current dose of Toprol.  No PVCs by physical exam or on EKG.  2.  Essential hypertension: Blood pressure is reasonably controlled on current medications.  3.  Hyperlipidemia: Currently on atorvastatin 20 mg daily.  Most recent lipid profile showed an LDL of 74.  4.  Chronic kidney disease: Followed by nephrology and has been stable.    Disposition:   FU with me in 12 months  Signed,  Jimmy Sacramento, MD  09/11/2021 11:34 AM    [place]

## 2021-09-11 NOTE — Patient Instructions (Signed)
Medication Instructions:  Your physician recommends that you continue on your current medications as directed. Please refer to the Current Medication list given to you today.  *If you need a refill on your cardiac medications before your next appointment, please call your pharmacy*   Lab Work: None ordered If you have labs (blood work) drawn today and your tests are completely normal, you will receive your results only by: [place] (if you have MyChart) OR A paper copy in the mail If you have any lab test that is abnormal or we need to change your treatment, we will call you to review the results.   Testing/Procedures: None ordered   Follow-Up: At [facility], you and your health needs are our priority.  As part of our continuing mission to provide you with exceptional heart care, we have created designated Provider Care Teams.  These Care Teams include your primary Cardiologist (physician) and Advanced Practice Providers (APPs -  Physician Assistants and Nurse Practitioners) who all work together to provide you with the care you need, when you need it.  We recommend signing up for the patient portal called "MyChart".  Sign up information is provided on this After Visit Summary.  MyChart is used to connect with patients for Virtual Visits (Telemedicine).  Patients are able to view lab/test results, encounter notes, upcoming appointments, etc.  Non-urgent messages can be sent to your provider as well.   To learn more about what you can do with MyChart, go to [email or web address].    Your next appointment:   1 year(s)  The format for your next appointment:   In Person  Provider:   You may see Kathlyn Sacramento, MD or one of the following Advanced Practice Providers on your designated Care Team:   Murray Hodgkins, NP Christell Faith, PA-C Cadence Kathlen Mody, [place]    Other Instructions

## 2021-10-16 ENCOUNTER — Telehealth: Payer: Self-pay | Admitting: Family Medicine

## 2021-10-16 NOTE — Telephone Encounter (Signed)
Noted will keep an eye out for the forms.

## 2021-10-16 NOTE — Telephone Encounter (Signed)
Patient stated that his orthopaedic surgeon wanted Dr.Burchette to know he will be sending over a medical release form so patient could get surgery on leg.  Please advise.

## 2021-10-17 NOTE — Telephone Encounter (Signed)
Form placed in Dr. Anastasio Auerbach red folder.

## 2021-10-28 ENCOUNTER — Telehealth: Payer: Self-pay | Admitting: Cardiovascular Disease

## 2021-10-28 NOTE — Telephone Encounter (Signed)
° °  Pre-operative Risk Assessment    Patient Name: Jimmy Rice  DOB: 1945/10/27 MRN: [id number]      Request for Surgical Clearance    Procedure:   L Partial knee replacement   Date of Surgery:  Clearance 12/17/21                                 Surgeon:  Dr Kary Kos  Surgeon's Group or Practice Name:  [facility] Phone number:  [phone] Fax number:  [phone] ATT: [address] [address]134   Type of Clearance Requested:   - Medical    Type of Anesthesia:  Not Indicated   Additional requests/questions:    SignedAce Gins   10/28/2021, 2:35 PM

## 2021-10-28 NOTE — Telephone Encounter (Signed)
° °  Primary Cardiologist: Kathlyn Sacramento, MD  Chart reviewed as part of pre-operative protocol coverage. Given past medical history and time since last visit, based on ACC/AHA guidelines, Jimmy Rice would be at acceptable risk for the planned procedure without further cardiovascular testing.   His RCRI is a class I risk, 0.4% risk of major cardiac event.  I will route this recommendation to the requesting party via Epic fax function and remove from pre-op pool.  Please call with questions.  Jossie Ng. Kyndle Schlender NP-C    10/28/2021, 2:45 PM [facility] [place] [address] Office [phone] Fax [phone]

## 2021-11-01 ENCOUNTER — Encounter: Payer: Self-pay | Admitting: Cardiovascular Disease

## 2021-11-04 NOTE — Telephone Encounter (Signed)
[place] only next available

## 2021-12-02 NOTE — H&P (Signed)
KNEE ARTHROPLASTY ADMISSION H&P  Patient ID: Jimmy Rice MRN: [id number] DOB/AGE: Oct 26, 1945 76 y.o.  Chief Complaint: left knee pain.  Planned Procedure Date: 12/17/21 Medical Clearance by Dr. Carolann Littler   Cardiac Clearance by Coletta Memos NP-C Nephrology clearance by Dr. Santiago Bumpers   HPI: Jimmy Rice is a 76 y.o. 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 The patient has a history of pain and functional disability in the left knee due to arthritis and has failed non-surgical conservative treatments for greater than 12 weeks to include corticosteriod injections and activity modification.  Onset of symptoms was gradual, starting 2 years ago with gradually worsening course since that time. The patient noted no past surgery on the left knee.  Patient currently rates pain at 5 out of 10 with activity. Patient has night pain, worsening of pain with activity and weight bearing, and pain that interferes with activities of daily living.  Patient has evidence of subchondral sclerosis and joint space narrowing by imaging studies.  There is no active infection.  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   Complication of anesthesia    woke up "fighting"  confusion   GERD 01/18/2009   HYPERTENSION 01/18/2009   HYPOGONADISM 01/18/2009   Past Surgical History:  Procedure Laterality Date   COLONOSCOPY     INGUINAL HERNIA REPAIR Right [place]   PARTIAL KNEE ARTHROPLASTY Right 07/22/2016   Procedure: UNICOMPARTMENTAL KNEE ARTHROPLASTY;  Surgeon: Renette Butters, MD;  Location: [place];  Service: Orthopedics;  Laterality: Right;   PROSTATE BIOPSY  X 5   REPLACEMENT UNICONDYLAR JOINT KNEE Right 07/22/2016   TRANSURETHRAL  RESECTION OF PROSTATE  12/2014   Allergies  Allergen Reactions   Amlodipine Hives   Flomax [Tamsulosin Hcl] Other (See Comments)    DIFFICULTY BREATHING NASAL STUFFINESS   Tamsulosin Other (See Comments)    DIFFICULTY BREATHING NASAL STUFFINESS    Prior to Admission medications   Medication Sig Start Date End Date Taking? Authorizing Provider  atorvastatin (LIPITOR) 20 MG tablet TAKE 1 TABLET BY MOUTH EVERY DAY 03/25/21  Yes Burchette, Alinda Sierras, MD  losartan-hydrochlorothiazide (HYZAAR) 100-12.5 MG tablet TAKE 1 TABLET DAILY 01/16/21  Yes Burchette, Alinda Sierras, MD  metoprolol succinate (TOPROL-XL) 50 MG 24 hr tablet Take 1 tablet (50 mg total) by mouth daily. Take with or immediately following a meal. 10/17/20  Yes Burchette, Alinda Sierras, MD   Social History   Socioeconomic History   Marital status: Married    Spouse name: Not on file   Number of children: Not on file   Years of education: Not on file   Highest education level: Not on file  Occupational History   Not on file  Tobacco Use   Smoking status: Former    Types: Cigarettes   Smokeless tobacco: Never   Tobacco comments:    "quit smoking in the late 1960's; smoked < 1 pack cigarettes TOTAL"  Vaping Use   Vaping Use: Never used  Substance and Sexual Activity   Alcohol use: Not Currently    Alcohol/week: 1.0 standard drink    Types: 1 Cans of beer per week    Comment: none  Drug use: No   Sexual activity: Yes  Other Topics Concern   Not on file  Social History Narrative   Retired from hazardous waste transportation   Right handed   One story home   Social Determinants of Health   Financial Resource Strain: Low Risk    Difficulty of Paying Living Expenses: Not hard at all  Food Insecurity: No Food Insecurity   Worried About Charity fundraiser in the Last Year: Never true   Arboriculturist in the Last Year: Never true  Transportation Needs: No Transportation Needs   Lack of Transportation (Medical): No   Lack of  Transportation (Non-Medical): No  Physical Activity: Sufficiently Active   Days of Exercise per Week: 5 days   Minutes of Exercise per Session: 120 min  Stress: No Stress Concern Present   Feeling of Stress : Not at all  Social Connections: Socially Integrated   Frequency of Communication with Friends and Family: Twice a week   Frequency of Social Gatherings with Friends and Family: More than three times a week   Attends Religious Services: 1 to 4 times per year   Active Member of [employer] or Organizations: Yes   Attends Archivist Meetings: 1 to 4 times per year   Marital Status: Married   Family History  Problem Relation Age of Onset   Breast cancer Mother    Kidney disease Father    Stomach cancer Paternal Aunt    Breast cancer Sister    Bladder Cancer Brother    Colon cancer Neg Hx    Esophageal cancer Neg Hx    Rectal cancer Neg Hx     ROS: Currently denies lightheadedness, dizziness, Fever, chills, CP, SOB.   No personal history of DVT, PE, MI, or CVA. No loose teeth. + upper dentures All other systems have been reviewed and were otherwise currently negative with the exception of those mentioned in the HPI and as above.  Objective: Vitals: Ht: 5'11" Wt: 202.6 lbs Temp: 98.1 BP: 151/75 Pulse: 63 O2 99% on room air.   Physical Exam: General: Alert, NAD.  Antalgic Gait  HEENT: EOMI, Good Neck Extension  Pulm: No increased work of breathing.  Clear B/L A/P w/o crackle or wheeze.  CV: RRR, No m/g/r appreciated  GI: soft, NT, ND. BS x 4 quadrants Neuro: CN II-XII grossly intact without focal deficit.  Sensation intact distally Skin: No lesions in the area of chief complaint MSK/Surgical Site:  - JLT. ROM 0-115 degrees.  5/5 strength in extension and flexion.  +EHL/FHL.  NVI.  Stable varus and valgus stress.    Imaging Review Plain radiographs demonstrate severe degenerative joint disease of the left knee.   The overall alignment isneutral. The bone quality  appears to be fair for age and reported activity level.  Preoperative templating of the joint replacement has been completed, documented, and submitted to the Operating Room personnel in order to optimize intra-operative equipment management.  Assessment: OA LEFT KNEE Active Problems:   * No active hospital problems. *   Plan: Plan for Procedure(s): UNICOMPARTMENTAL KNEE  The patient history, physical exam, clinical judgement of the provider and imaging are consistent with end stage degenerative joint disease and partial joint arthroplasty is deemed medically necessary. The treatment options including medical management, injection therapy, and arthroplasty were discussed at length. The risks and benefits of Procedure(s): UNICOMPARTMENTAL KNEE were presented and reviewed.  The risks of nonoperative treatment, versus surgical intervention including but not  limited to continued pain, aseptic loosening, stiffness, dislocation/subluxation, infection, bleeding, nerve injury, blood clots, cardiopulmonary complications, morbidity, mortality, among others were discussed. The patient verbalizes understanding and wishes to proceed with the plan.  Patient is being admitted for inpatient treatment for surgery, pain control, PT, prophylactic antibiotics, VTE prophylaxis, progressive ambulation, ADL's and discharge planning.   Dental prophylaxis discussed and recommended for 2 years postoperatively.  The patient does meet the criteria for TXA which will be used perioperatively.   ASA 81 mg BID will be used postoperatively for DVT prophylaxis in addition to SCDs, and early ambulation. Plan for Tramadol and Gabapentin for pain. Unable to take Tylenol or NSAIDs.  Robaxin for muscle spasms.   Zofran for nausea and vomiting. Pharmacy- [facility] on [address] The patient is planning to be discharged home with OPPT and into the care of his wife Edd Fabian who can be reached at [phone] Follow up  appt 01-01-22 at 4:15pm     Alisa Graff Office [phone] 12/02/2021 5:39 PM

## 2021-12-03 ENCOUNTER — Other Ambulatory Visit: Payer: Self-pay | Admitting: Family Medicine

## 2021-12-04 ENCOUNTER — Encounter (HOSPITAL_COMMUNITY): Admission: RE | Admit: 2021-12-04 | Payer: Medicare (Managed Care) | Source: Ambulatory Visit

## 2021-12-04 NOTE — Progress Notes (Signed)
? ? Your procedure is scheduled on:  ? V 12/17/2021.  ? Report to [facility] Main  Entrance ? ? Report to admitting at    0515              AM ?DO NOT [place], PICTURE ID OR WALLET DAY OF SURGERY.  ?  ? ? Call this number if you have problems the morning of surgery [phone]  ? ? REMEMBER: NO  SOLID FOODS , CANDY, GUM OR MINTS AFTER MIDNITE THE NITE BEFORE SURGERY .       Marland Kitchen CLEAR LIQUIDS UNTIL   0430am              DAY OF SURGERY.      PLEASE FINISH ENSURE DRINK PER SURGEON ORDER  WHICH NEEDS TO BE COMPLETED AT       0430am      MORNING OF SURGERY.   ? ? ? ? ?CLEAR LIQUID DIET ? ? ?Foods Allowed      ?WATER ?BLACK COFFEE ( SUGAR OK, NO MILK, CREAM OR CREAMER) REGULAR AND DECAF  ?TEA ( SUGAR OK NO MILK, CREAM, OR CREAMER) REGULAR AND DECAF  ?PLAIN JELLO ( NO RED)  ?FRUIT ICES ( NO RED, NO FRUIT PULP)  ?POPSICLES ( NO RED)  ?JUICE- APPLE, WHITE GRAPE AND WHITE CRANBERRY  ?SPORT DRINK LIKE GATORADE ( NO RED)  ?CLEAR BROTH ( VEGETABLE , CHICKEN OR BEEF)                                                               ? ?    ? ?BRUSH YOUR TEETH MORNING OF SURGERY AND RINSE YOUR MOUTH OUT, NO CHEWING GUM CANDY OR MINTS. ?  ? ? Take these medicines the morning of surgery with A SIP OF WATER:  toprol  ? ? ?DO NOT TAKE ANY DIABETIC MEDICATIONS DAY OF YOUR SURGERY ?                  ?            You may not have any metal on your body including hair pins and  ?            piercings  Do not wear jewelry, make-up, lotions, powders or perfumes, deodorant ?            Do not wear nail polish on your fingernails.   ?           IF YOU ARE A MALE AND WANT TO SHAVE UNDER ARMS OR LEGS PRIOR TO SURGERY YOU MUST DO SO AT LEAST 48 HOURS PRIOR TO SURGERY.  ?            Men may shave face and neck. ? ? Do not bring valuables to the hospital. [place] NOT ?            RESPONSIBLE   FOR VALUABLES. ? Contacts, dentures or bridgework may not be worn into surgery. ? Leave suitcase in the car. After surgery it may  be brought to your room. ? ?  ? Patients discharged the day of surgery will not be allowed to drive home. IF YOU ARE HAVING SURGERY AND GOING HOME THE SAME DAY, YOU MUST HAVE AN ADULT  TO DRIVE YOU HOME AND BE WITH YOU FOR 24 HOURS. YOU MAY GO HOME BY TAXI OR UBER OR ORTHERWISE, BUT AN ADULT MUST ACCOMPANY YOU HOME AND STAY WITH YOU FOR 24 HOURS. ?  ? ?            Please read over the following fact sheets you were given: ?_____________________________________________________________________ ? ?[place] - Preparing for Surgery ?Before surgery, you can play an important role.  Because skin is not sterile, your skin needs to be as free of germs as possible.  You can reduce the number of germs on your skin by washing with CHG (chlorahexidine gluconate) soap before surgery.  CHG is an antiseptic cleaner which kills germs and bonds with the skin to continue killing germs even after washing. ?Please DO NOT use if you have an allergy to CHG or antibacterial soaps.  If your skin becomes reddened/irritated stop using the CHG and inform your nurse when you arrive at Short Stay. ?Do not shave (including legs and underarms) for at least 48 hours prior to the first CHG shower.  You may shave your face/neck. ?Please follow these instructions carefully: ? 1.  Shower with CHG Soap the night before surgery and the  morning of Surgery. ? 2.  If you choose to wash your hair, wash your hair first as usual with your  normal  shampoo. ? 3.  After you shampoo, rinse your hair and body thoroughly to remove the  shampoo.                           4.  Use CHG as you would any other liquid soap.  You can apply chg directly  to the skin and wash  ?                     Gently with a scrungie or clean washcloth. ? 5.  Apply the CHG Soap to your body ONLY FROM THE NECK DOWN.   Do not use on face/ open      ?                     Wound or open sores. Avoid contact with eyes, ears mouth and genitals (private parts).  ?                     Licensed conveyancer,  Genitals (private parts) with your normal soap. ?            6.  Wash thoroughly, paying special attention to the area where your surgery  will be performed. ? 7.  Thoroughly rinse your body with warm water from the neck down. ? 8.  DO NOT shower/wash with your normal soap after using and rinsing off  the CHG Soap. ?               9.  Pat yourself dry with a clean towel. ?           10.  Wear clean pajamas. ?           11.  Place clean sheets on your bed the night of your first shower and do not  sleep with pets. ?Day of Surgery : ?Do not apply any lotions/deodorants the morning of surgery.  Please wear clean clothes to the hospital/surgery center. ? ?FAILURE TO FOLLOW THESE INSTRUCTIONS MAY RESULT IN THE CANCELLATION OF YOUR SURGERY ?PATIENT SIGNATURE_________________________________ ? ?  NURSE SIGNATURE__________________________________ ? ?________________________________________________________________________  ? ? ?           ?

## 2021-12-04 NOTE — Progress Notes (Addendum)
Anesthesia Review: ? ?PCP: DR Carolann Littler  LOV 12/11/21.  ?Cardiologist : DR Fletcher Anon Nex visit on 01/31/22.  ?Clearance in telephone encounter 10/28/21 by Ty Hilts Cleaver,NP  LOV with DR Fletcher Anon 09/11/21  ?Kidney- [facility] - Requested LOV note and labs.  Followed by Dr Santiago Bumpers LOV 10/14/21 along with labs.  ?To have upcoming prostate surgery- 02/26/22 with DR Norma Fredrickson at [facility] in [place]  ?Chest x-ray : ?EKG : 09/11/21  ?Echo : ?Stress test: ?Cardiac Cath :  ?Activity level: can do a flgiht of stairs without difficulty  ?Sleep Study/ CPAP : none  ?Fasting Blood Sugar :      / Checks Blood Sugar -- times a day:   ?Blood Thinner/ Instructions /Last Dose: ?ASA / Instructions/ Last Dose :   ?11/21/21- CBC, CMP, PT AND U/A IN Epic  ?BMp repeated on 12/10/21 due to abnl on 11/21/21.   ?No covid test- ambulatory surgery  ?BMP done 12/10/21 routed to DR Percell Miller.   ?

## 2021-12-10 ENCOUNTER — Encounter (HOSPITAL_COMMUNITY): Payer: Self-pay

## 2021-12-10 ENCOUNTER — Encounter (HOSPITAL_COMMUNITY)
Admission: RE | Admit: 2021-12-10 | Discharge: 2021-12-10 | Disposition: A | Discharge status: Home / Self Care | Payer: Medicare (Managed Care) | Source: Ambulatory Visit | Attending: Orthopedic Surgery | Admitting: Orthopedic Surgery

## 2021-12-10 ENCOUNTER — Other Ambulatory Visit: Payer: Self-pay

## 2021-12-10 VITALS — BP 145/90 | HR 68 | Temp 98.3°F | Resp 16 | Ht 72.0 in | Wt 196.0 lb

## 2021-12-10 DIAGNOSIS — N400 Enlarged prostate without lower urinary tract symptoms: Secondary | ICD-10-CM | POA: Diagnosis not present

## 2021-12-10 DIAGNOSIS — I129 Hypertensive chronic kidney disease with stage 1 through stage 4 chronic kidney disease, or unspecified chronic kidney disease: Secondary | ICD-10-CM | POA: Diagnosis not present

## 2021-12-10 DIAGNOSIS — Z79899 Other long term (current) drug therapy: Secondary | ICD-10-CM | POA: Diagnosis not present

## 2021-12-10 DIAGNOSIS — M1712 Unilateral primary osteoarthritis, left knee: Secondary | ICD-10-CM | POA: Diagnosis not present

## 2021-12-10 DIAGNOSIS — Z01818 Encounter for other preprocedural examination: Secondary | ICD-10-CM

## 2021-12-10 DIAGNOSIS — Z87891 Personal history of nicotine dependence: Secondary | ICD-10-CM | POA: Insufficient documentation

## 2021-12-10 DIAGNOSIS — Z01812 Encounter for preprocedural laboratory examination: Secondary | ICD-10-CM | POA: Diagnosis not present

## 2021-12-10 DIAGNOSIS — K219 Gastro-esophageal reflux disease without esophagitis: Secondary | ICD-10-CM | POA: Insufficient documentation

## 2021-12-10 DIAGNOSIS — N183 Chronic kidney disease, stage 3 unspecified: Secondary | ICD-10-CM | POA: Insufficient documentation

## 2021-12-10 HISTORY — DX: Malignant (primary) neoplasm, unspecified: C80.1

## 2021-12-10 LAB — SURGICAL PCR SCREEN
MRSA, PCR: NEGATIVE
Staphylococcus aureus: NEGATIVE

## 2021-12-10 LAB — BASIC METABOLIC PANEL
Anion gap: 4 — ABNORMAL LOW (ref 5–15)
BUN: 29 mg/dL — ABNORMAL HIGH (ref 8–23)
CO2: 28 mmol/L (ref 22–32)
Calcium: 9.5 mg/dL (ref 8.9–10.3)
Chloride: 104 mmol/L (ref 98–111)
Creatinine, Ser: 1.69 mg/dL — ABNORMAL HIGH (ref 0.61–1.24)
GFR, Estimated: 42 mL/min — ABNORMAL LOW (ref >60)
Glucose, Bld: 121 mg/dL — ABNORMAL HIGH (ref 70–99)
Potassium: 3.8 mmol/L (ref 3.5–5.1)
Sodium: 136 mmol/L (ref 135–145)

## 2021-12-11 ENCOUNTER — Encounter: Payer: Self-pay | Admitting: Family Medicine

## 2021-12-11 ENCOUNTER — Ambulatory Visit (INDEPENDENT_AMBULATORY_CARE_PROVIDER_SITE_OTHER): Payer: Medicare (Managed Care) | Admitting: Family Medicine

## 2021-12-11 VITALS — BP 146/86 | HR 64 | Temp 99.0°F | Ht 72.0 in | Wt 203.4 lb

## 2021-12-11 DIAGNOSIS — I10 Essential (primary) hypertension: Secondary | ICD-10-CM | POA: Diagnosis not present

## 2021-12-11 DIAGNOSIS — N1832 Chronic kidney disease, stage 3b: Secondary | ICD-10-CM | POA: Diagnosis not present

## 2021-12-11 MED ORDER — DILTIAZEM HCL ER COATED BEADS 120 MG PO CP24: 120.0000 mg | ORAL_CAPSULE | Freq: Every day | ORAL | 5 refills | Status: DC

## 2021-12-11 NOTE — Anesthesia Preprocedure Evaluation (Signed)
Anesthesia Evaluation    Airway        Dental   Pulmonary former smoker,           Cardiovascular hypertension,      Neuro/Psych    GI/Hepatic   Endo/Other    Renal/GU      Musculoskeletal   Abdominal   Peds  Hematology   Anesthesia Other Findings   Reproductive/Obstetrics                             Anesthesia Physical Anesthesia Plan  ASA:   Anesthesia Plan:    Post-op Pain Management:    Induction:   PONV Risk Score and Plan:   Airway Management Planned:   Additional Equipment:   Intra-op Plan:   Post-operative Plan:   Informed Consent:   Plan Discussed with:   Anesthesia Plan Comments: (See PAT note 12/10/2021)        Anesthesia Quick Evaluation

## 2021-12-11 NOTE — Progress Notes (Addendum)
Anesthesia Chart Review ? ? Case: [id number] Date/Time: 12/17/21 0715  ? Procedure: UNICOMPARTMENTAL KNEE (Left: Knee)  ? Anesthesia type: Choice  ? Pre-op diagnosis: OA LEFT KNEE  ? Location: [facility] ROOM 08 / [facility]  ? Surgeons: Jimmy Butters, MD  ? ?  ? ? ?DISCUSSION:76 y.o. former smoker with h/o HTN, GERD, CKD Stage III followed by [facility] last seen 10/14/2021 (baseline creatinine 1.6-2), BPH, left knee OA scheduled for above procedure 12/17/2021 with Dr. Edmonia Rice.  ? ?Per cardiology preoperative evaluation 10/28/2021, "Chart reviewed as part of pre-operative protocol coverage. Given past medical history and time since last visit, based on ACC/AHA guidelines, Jimmy Rice would be at acceptable risk for the planned procedure without further cardiovascular testing.  ?  ?His RCRI is a class I risk, 0.4% risk of major cardiac event." ? ?Seen by PCP 12/11/2021. Cardizem started for better BP control.  Evaluate DOS.  ? ?Anticipate pt can proceed with planned procedure barring acute status change.   ?VS: BP (!) 145/90   Pulse 68   Temp 36.8 ?C (Oral)   Resp 16   Ht 6' (1.829 m)   Wt 88.9 kg   SpO2 98%   BMI 26.58 kg/m?  ? ?PROVIDERS: ?Jimmy Post, MD is PCP  ? ?Primary Cardiologist: Jimmy Sacramento, MD ?LABS: Labs reviewed: Acceptable for surgery. ?(all labs ordered are listed, but only abnormal results are displayed) ? ?Labs Reviewed  ?BASIC METABOLIC PANEL - Abnormal; Notable for the following components:  ?    Result Value  ? Glucose, Bld 121 (*)   ? BUN 29 (*)   ? Creatinine, Ser 1.69 (*)   ? GFR, Estimated 42 (*)   ? Anion gap 4 (*)   ? All other components within normal limits  ?SURGICAL PCR SCREEN  ? ? ? ?IMAGES: ? ? ?EKG: ?09/11/2021 ?Rate 65 bpm  ?NSR ?Moderate voltage cirteria for LVH, may be normal variant  ? ?CV: ?Echo 06/24/2019 ?1. LV size is normal. LV wall thickness is mildly increased. There is mild left ventricular hypertrophy. Left ventricular systolic function is  normal. No segmental wall motion abnormalities present.  ?2. There is moderate left atrial enlargement. Right atrial size is normal.  ?3. Right ventricular size is normal. The right ventricular systolic function is normal.  ?4. Mild aortic valve leaflet thickening. There is no aortic valve stenosis. No aortic insufficiency is present. The aortic root size is upper limits of normal to borderline dilated at the Sinuses of Valsalva. The ascending aorta is normal.  ?5. Mild thickening of the mitral valve leaflets. Trace / physiologic mitral regurgitation is present.  ?6. The tricuspid valve is structurally normal. Trace tricuspid regurgitation is present. The structure of the pulmonic valve is normal. Mild pulmonary regurgitation is present.  ?7. No pericardial effusion.  ?8. The pulmonary artery systolic pressure is estimated to be 21 mmHg plus the RA pressure.  ?Past Medical History:  ?Diagnosis Date  ? Arthritis   ? "right knee" (07/22/2016)  ? BENIGN PROSTATIC HYPERTROPHY, HX OF 04/04/2009  ? Blood transfusion   ? Cancer [facility])   ? hx of porstate cancer  ? Childhood asthma   ? pt denies on proep of 12/10/21  ? Chronic kidney disease (CKD), stage III (moderate) (HCC)   ? 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 Complication of anesthesia   ? woke up "fighting"  confusion  ? GERD 01/18/2009  ? HYPERTENSION 01/18/2009  ? HYPOGONADISM 01/18/2009  ? ? ?  Past Surgical History:  ?Procedure Laterality Date  ? COLONOSCOPY    ? INGUINAL HERNIA REPAIR Right 1997  ? NASAL SINUS SURGERY  1997  ? PARTIAL KNEE ARTHROPLASTY Right 07/22/2016  ? Procedure: UNICOMPARTMENTAL KNEE ARTHROPLASTY;  Surgeon: Jimmy Butters, MD;  Location: [facility];  Service: Orthopedics;  Laterality: Right;  ? PROSTATE BIOPSY  X 5  ? REPLACEMENT UNICONDYLAR JOINT KNEE Right 07/22/2016  ? TRANSURETHRAL RESECTION OF PROSTATE  12/2014  ? ? ?MEDICATIONS: ? atorvastatin (LIPITOR) 20 MG tablet  ? diltiazem (CARDIZEM CD) 120 MG 24 hr capsule  ?  losartan-hydrochlorothiazide (HYZAAR) 100-12.5 MG tablet  ? metoprolol succinate (TOPROL-XL) 50 MG 24 hr tablet  ? ?No current facility-administered medications for this encounter.  ? ? ? ?Jimmy Felix Ward, PA-C ?[facility] Pre-Surgical [facility] ?[phone] ? ? ? ? ?

## 2021-12-11 NOTE — Progress Notes (Signed)
Established Patient Office Visit  Subjective:  Patient ID: Jimmy Rice, male    DOB: [birth year]  Age: 76 y.o. MRN: [id number]  CC:  Chief Complaint  Patient presents with   Follow-up    HPI Jimmy Rice presents for exacerbation of hypertension.  He has longstanding history of hypertension.  Also has stage III chronic kidney disease.  Upcoming left partial left knee replacement.  He has had multiple elevated readings recently around 140/90 both by home readings and also at other offices.  He is currently on metoprolol 50 mg daily and losartan/HCTZ 100/12.5 mg 1 daily.  For years took amlodipine with fairly good success.  He did at one point developed acute hives but not clear if this is related to the amlodipine.  No anaphylaxis.  Amlodipine was labeled as possible allergy.  He has no recent nonsteroidal use.  No alcohol use.  Does not eat out a lot.  Cooks mostly at home but does add a lot of salt to his cooking.  Has had some occasional recent headaches.  No significant peripheral edema.  Still exercise some with walking but limited because of his knee.  He has history of BPH and looking at prostatectomy in May.  Plan to get robotic prostatectomy by surgeon in [facility]    Past Medical History:  Diagnosis Date   Arthritis    "right knee" (07/22/2016)   BENIGN PROSTATIC HYPERTROPHY, HX OF 04/04/2009   Blood transfusion    Cancer (HCC)    hx of porstate cancer   Childhood asthma    pt denies on proep of 12/10/21   Chronic kidney disease (CKD), stage III (moderate) (HCC)    cysts on kidneys, 3rd stage kidney disease   Complication of anesthesia    woke up "fighting"  confusion   GERD 01/18/2009   HYPERTENSION 01/18/2009   HYPOGONADISM 01/18/2009    Past Surgical History:  Procedure Laterality Date   COLONOSCOPY     INGUINAL HERNIA REPAIR Right [place]   PARTIAL KNEE ARTHROPLASTY Right 07/22/2016   Procedure: UNICOMPARTMENTAL KNEE  ARTHROPLASTY;  Surgeon: Renette Butters, MD;  Location: [facility];  Service: Orthopedics;  Laterality: Right;   PROSTATE BIOPSY  X 5   REPLACEMENT UNICONDYLAR JOINT KNEE Right 07/22/2016   TRANSURETHRAL RESECTION OF PROSTATE  12/2014    Family History  Problem Relation Age of Onset   Breast cancer Mother    Kidney disease Father    Stomach cancer Paternal Aunt    Breast cancer Sister    Bladder Cancer Brother    Colon cancer Neg Hx    Esophageal cancer Neg Hx    Rectal cancer Neg Hx     Social History   Socioeconomic History   Marital status: Married    Spouse name: Not on file   Number of children: Not on file   Years of education: Not on file   Highest education level: Not on file  Occupational History   Not on file  Tobacco Use   Smoking status: Former    Types: Cigarettes   Smokeless tobacco: Never   Tobacco comments:    "quit smoking in the late 1960's; smoked < 1 pack cigarettes TOTAL"  Vaping Use   Vaping Use: Never used  Substance and Sexual Activity   Alcohol use: Not Currently    Alcohol/week: 1.0 standard drink    Types: 1 Cans of beer per week    Comment:  none   Drug use: No   Sexual activity: Yes  Other Topics Concern   Not on file  Social History Narrative   Retired from Retail buyer transportation   Right handed   One story home   Social Determinants of Health   Financial Resource Strain: Low Risk    Difficulty of Paying Living Expenses: Not hard at all  Food Insecurity: No Food Insecurity   Worried About Charity fundraiser in the Last Year: Never true   Arboriculturist in the Last Year: Never true  Transportation Needs: No Transportation Needs   Lack of Transportation (Medical): No   Lack of Transportation (Non-Medical): No  Physical Activity: Sufficiently Active   Days of Exercise per Week: 5 days   Minutes of Exercise per Session: 120 min  Stress: No Stress Concern Present   Feeling of Stress : Not at all  Social Connections:  Socially Integrated   Frequency of Communication with Friends and Family: Twice a week   Frequency of Social Gatherings with Friends and Family: More than three times a week   Attends Religious Services: 1 to 4 times per year   Active Member of [employer] or Organizations: Yes   Attends Archivist Meetings: 1 to 4 times per year   Marital Status: Married  Human resources officer Violence: Not At Risk   Fear of Current or Ex-Partner: No   Emotionally Abused: No   Physically Abused: No   Sexually Abused: No    Outpatient Medications Prior to Visit  Medication Sig Dispense Refill   atorvastatin (LIPITOR) 20 MG tablet TAKE 1 TABLET BY MOUTH EVERY DAY 90 tablet 1   losartan-hydrochlorothiazide (HYZAAR) 100-12.5 MG tablet TAKE 1 TABLET DAILY 90 tablet 3   metoprolol succinate (TOPROL-XL) 50 MG 24 hr tablet Take 1 tablet (50 mg total) by mouth daily. Take with or immediately following a meal. 90 tablet 3   No facility-administered medications prior to visit.    Allergies  Allergen Reactions   Amlodipine Hives   Flomax [Tamsulosin Hcl] Other (See Comments)    DIFFICULTY BREATHING NASAL STUFFINESS   Tamsulosin Other (See Comments)    DIFFICULTY BREATHING NASAL STUFFINESS     ROS Review of Systems  Constitutional:  Negative for fatigue and unexpected weight change.  Eyes:  Negative for visual disturbance.  Respiratory:  Negative for cough, chest tightness and shortness of breath.   Cardiovascular:  Negative for chest pain, palpitations and leg swelling.  Neurological:  Negative for dizziness, syncope, weakness, light-headedness and headaches.     Objective:    Physical Exam Constitutional:      Appearance: He is well-developed.  HENT:     Right Ear: External ear normal.     Left Ear: External ear normal.  Eyes:     Pupils: Pupils are equal, round, and reactive to light.  Neck:     Thyroid: No thyromegaly.  Cardiovascular:     Rate and Rhythm: Normal rate and regular  rhythm.     Heart sounds: No murmur heard.   No gallop.  Pulmonary:     Effort: Pulmonary effort is normal. No respiratory distress.     Breath sounds: Normal breath sounds. No wheezing or rales.  Musculoskeletal:     Cervical back: Neck supple.     Right lower leg: No edema.     Left lower leg: No edema.  Neurological:     Mental Status: He is alert and oriented to person, place,  and time.    BP (!) 146/86 (BP Location: Left Arm, Patient Position: Sitting, Cuff Size: Normal)    Pulse 64    Temp 99 F (37.2 C) (Oral)    Ht 6' (1.829 m)    Wt 203 lb 6.4 oz (92.3 kg)    SpO2 98%    BMI 27.59 kg/m  Wt Readings from Last 3 Encounters:  12/11/21 203 lb 6.4 oz (92.3 kg)  12/10/21 196 lb (88.9 kg)  09/11/21 203 lb 12.8 oz (92.4 kg)     Health Maintenance Due  Topic Date Due   Zoster Vaccines- Shingrix (1 of 2) Never done   COVID-19 Vaccine (4 - Booster for Moderna series) 10/15/2020    There are no preventive care reminders to display for this patient.  Lab Results  Component Value Date   TSH 1.99 06/04/2020   Lab Results  Component Value Date   WBC 5.6 08/07/2021   HGB 13.4 08/07/2021   HCT 41.5 08/07/2021   MCV 91.8 08/07/2021   PLT 207 08/07/2021   Lab Results  Component Value Date   NA 136 12/10/2021   K 3.8 12/10/2021   CO2 28 12/10/2021   GLUCOSE 121 (H) 12/10/2021   BUN 29 (H) 12/10/2021   CREATININE 1.69 (H) 12/10/2021   BILITOT 1.4 (H) 11/30/2020   ALKPHOS 61 11/30/2020   AST 23 11/30/2020   ALT 24 11/30/2020   PROT 8.4 (H) 11/30/2020   ALBUMIN 4.4 11/30/2020   CALCIUM 9.5 12/10/2021   ANIONGAP 4 (L) 12/10/2021   GFR 34.53 (L) 04/03/2021   Lab Results  Component Value Date   CHOL 148 04/03/2021   Lab Results  Component Value Date   HDL 41.30 04/03/2021   Lab Results  Component Value Date   LDLCALC 74 04/03/2021   Lab Results  Component Value Date   TRIG 159.0 (H) 04/03/2021   Lab Results  Component Value Date   CHOLHDL 4 04/03/2021    Lab Results  Component Value Date   HGBA1C 5.7 06/04/2020      Assessment & Plan:   #1 hypertension.  Suboptimally controlled.  Patient currently on losartan HCTZ and metoprolol.  At 1 point took hydralazine and not sure if he had issues with that.  He took amlodipine for several years and at one point had hives but not clear if this was related to amlodipine. -We discussed possible trial of low-dose Cardizem CD.  We did explain that this is a calcium channel blocker but it was very questionable whether his hives were related amlodipine.  Cautious trial and follow-up immediately for any hives, pruritus, or other concerns -We discussed low-sodium diet with handout given.  Try to keep daily sodium intake less than 2500 mg -Set up office follow-up in 4 to 6 weeks to reassess  #2 chronic kidney disease stage III.  We explained good goal for blood pressures less than 130/80 with his chronic kidney disease.  Avoid nonsteroidals.   Meds ordered this encounter  Medications   diltiazem (CARDIZEM CD) 120 MG 24 hr capsule    Sig: Take 1 capsule (120 mg total) by mouth daily.    Dispense:  30 capsule    Refill:  5    Follow-up: Return in about 6 weeks (around 01/22/2022).    Carolann Littler, MD

## 2021-12-13 ENCOUNTER — Other Ambulatory Visit: Payer: Self-pay

## 2021-12-13 ENCOUNTER — Telehealth: Payer: Self-pay | Admitting: Family Medicine

## 2021-12-13 NOTE — Telephone Encounter (Signed)
Pt call and stated dr. Elease Hashimoto wanted to know how the new medication is working and pt stated it is working excellent. ?

## 2021-12-15 MED ORDER — DILTIAZEM HCL ER COATED BEADS 120 MG PO CP24: 120.0000 mg | ORAL_CAPSULE | Freq: Every day | ORAL | 3 refills | Status: DC

## 2021-12-15 NOTE — Telephone Encounter (Signed)
This has been sent

## 2021-12-17 ENCOUNTER — Encounter (HOSPITAL_COMMUNITY)
Admission: RE | Disposition: A | Discharge status: Home / Self Care | Payer: Self-pay | Source: Home / Self Care | Attending: Orthopedic Surgery

## 2021-12-17 ENCOUNTER — Other Ambulatory Visit: Payer: Self-pay

## 2021-12-17 ENCOUNTER — Ambulatory Visit (HOSPITAL_BASED_OUTPATIENT_CLINIC_OR_DEPARTMENT_OTHER): Payer: Medicare (Managed Care) | Admitting: Certified Registered Nurse Anesthetist

## 2021-12-17 ENCOUNTER — Ambulatory Visit (HOSPITAL_COMMUNITY): Payer: Medicare (Managed Care) | Admitting: Physician Assistant

## 2021-12-17 ENCOUNTER — Ambulatory Visit (HOSPITAL_COMMUNITY): Payer: Medicare (Managed Care)

## 2021-12-17 ENCOUNTER — Encounter (HOSPITAL_COMMUNITY): Payer: Self-pay | Admitting: Orthopedic Surgery

## 2021-12-17 ENCOUNTER — Ambulatory Visit (HOSPITAL_COMMUNITY)
Admission: RE | Admit: 2021-12-17 | Discharge: 2021-12-17 | Disposition: A | Discharge status: Home / Self Care | Payer: Medicare (Managed Care) | Attending: Orthopedic Surgery | Admitting: Orthopedic Surgery

## 2021-12-17 DIAGNOSIS — I10 Essential (primary) hypertension: Secondary | ICD-10-CM

## 2021-12-17 DIAGNOSIS — M1712 Unilateral primary osteoarthritis, left knee: Secondary | ICD-10-CM | POA: Diagnosis not present

## 2021-12-17 DIAGNOSIS — N289 Disorder of kidney and ureter, unspecified: Secondary | ICD-10-CM | POA: Insufficient documentation

## 2021-12-17 DIAGNOSIS — J45909 Unspecified asthma, uncomplicated: Secondary | ICD-10-CM | POA: Diagnosis not present

## 2021-12-17 DIAGNOSIS — Z87891 Personal history of nicotine dependence: Secondary | ICD-10-CM | POA: Insufficient documentation

## 2021-12-17 DIAGNOSIS — N281 Cyst of kidney, acquired: Secondary | ICD-10-CM | POA: Diagnosis not present

## 2021-12-17 DIAGNOSIS — N400 Enlarged prostate without lower urinary tract symptoms: Secondary | ICD-10-CM | POA: Diagnosis not present

## 2021-12-17 DIAGNOSIS — Z96652 Presence of left artificial knee joint: Secondary | ICD-10-CM

## 2021-12-17 DIAGNOSIS — K219 Gastro-esophageal reflux disease without esophagitis: Secondary | ICD-10-CM | POA: Diagnosis not present

## 2021-12-17 DIAGNOSIS — Z01818 Encounter for other preprocedural examination: Secondary | ICD-10-CM

## 2021-12-17 HISTORY — PX: PARTIAL KNEE ARTHROPLASTY: SHX2174

## 2021-12-17 LAB — CBC
HCT: 41 % (ref 39.0–52.0)
Hemoglobin: 13.2 g/dL (ref 13.0–17.0)
MCH: 30.6 pg (ref 26.0–34.0)
MCHC: 32.2 g/dL (ref 30.0–36.0)
MCV: 94.9 fL (ref 80.0–100.0)
Platelets: 155 10*3/uL (ref 150–400)
RBC: 4.32 MIL/uL (ref 4.22–5.81)
RDW: 13 % (ref 11.5–15.5)
WBC: 3.5 10*3/uL — ABNORMAL LOW (ref 4.0–10.5)
nRBC: 0 % (ref 0.0–0.2)

## 2021-12-17 IMAGING — DX DG KNEE 1-2V PORT*L*
2 series · 2 of 2 positions shown · non-contrast
Comparison: None.

CLINICAL DATA: Postop left knee surgery.

EXAM:
PORTABLE LEFT KNEE - 1-2 VIEW

[knee ap]
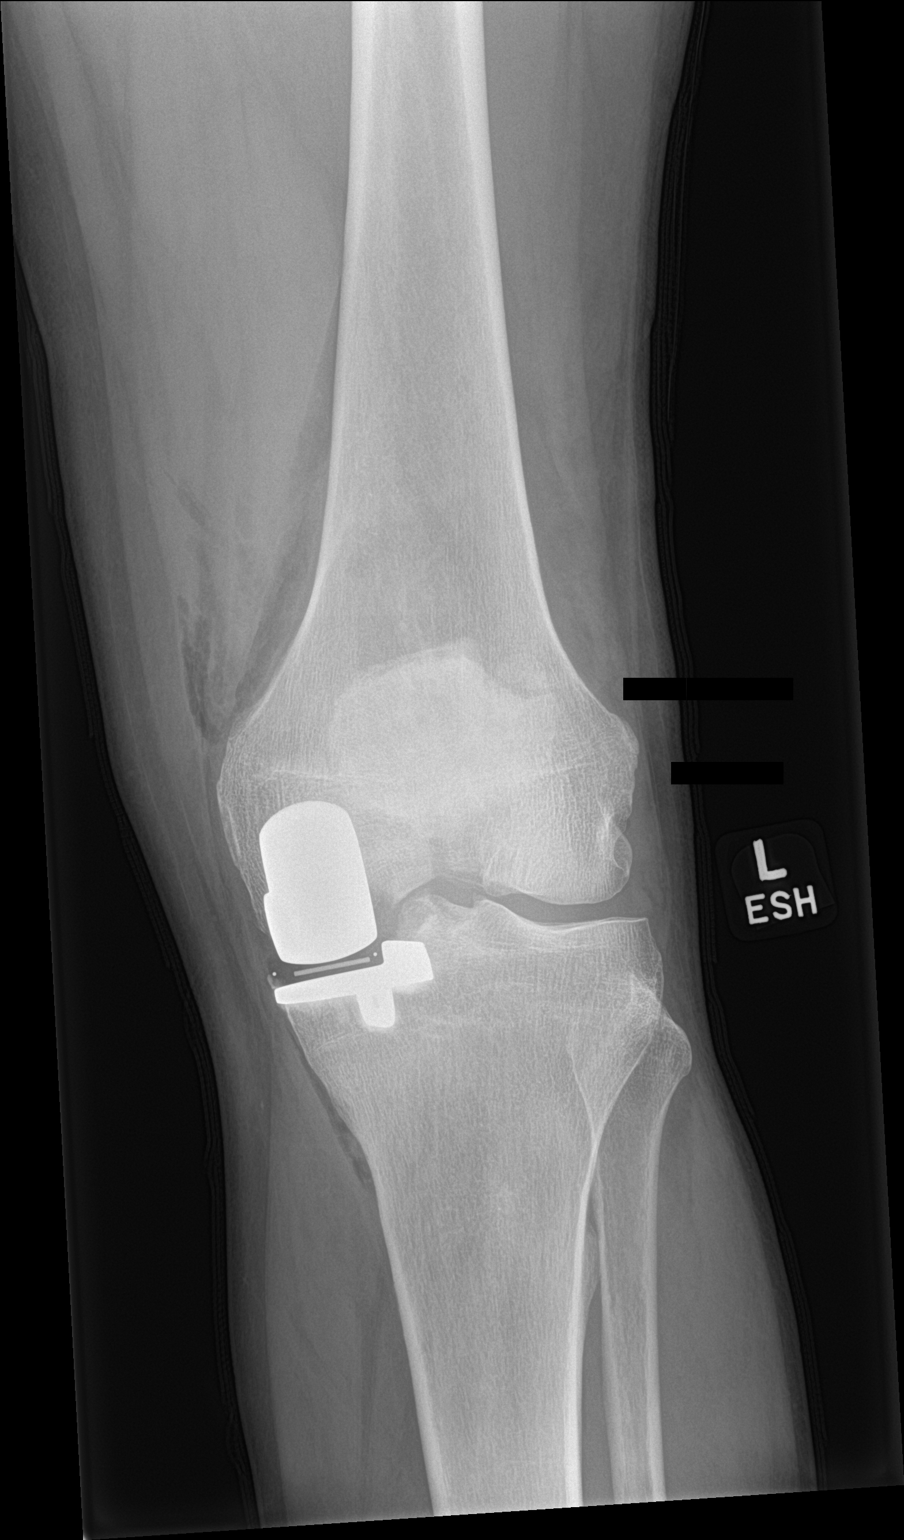

[knee lat]
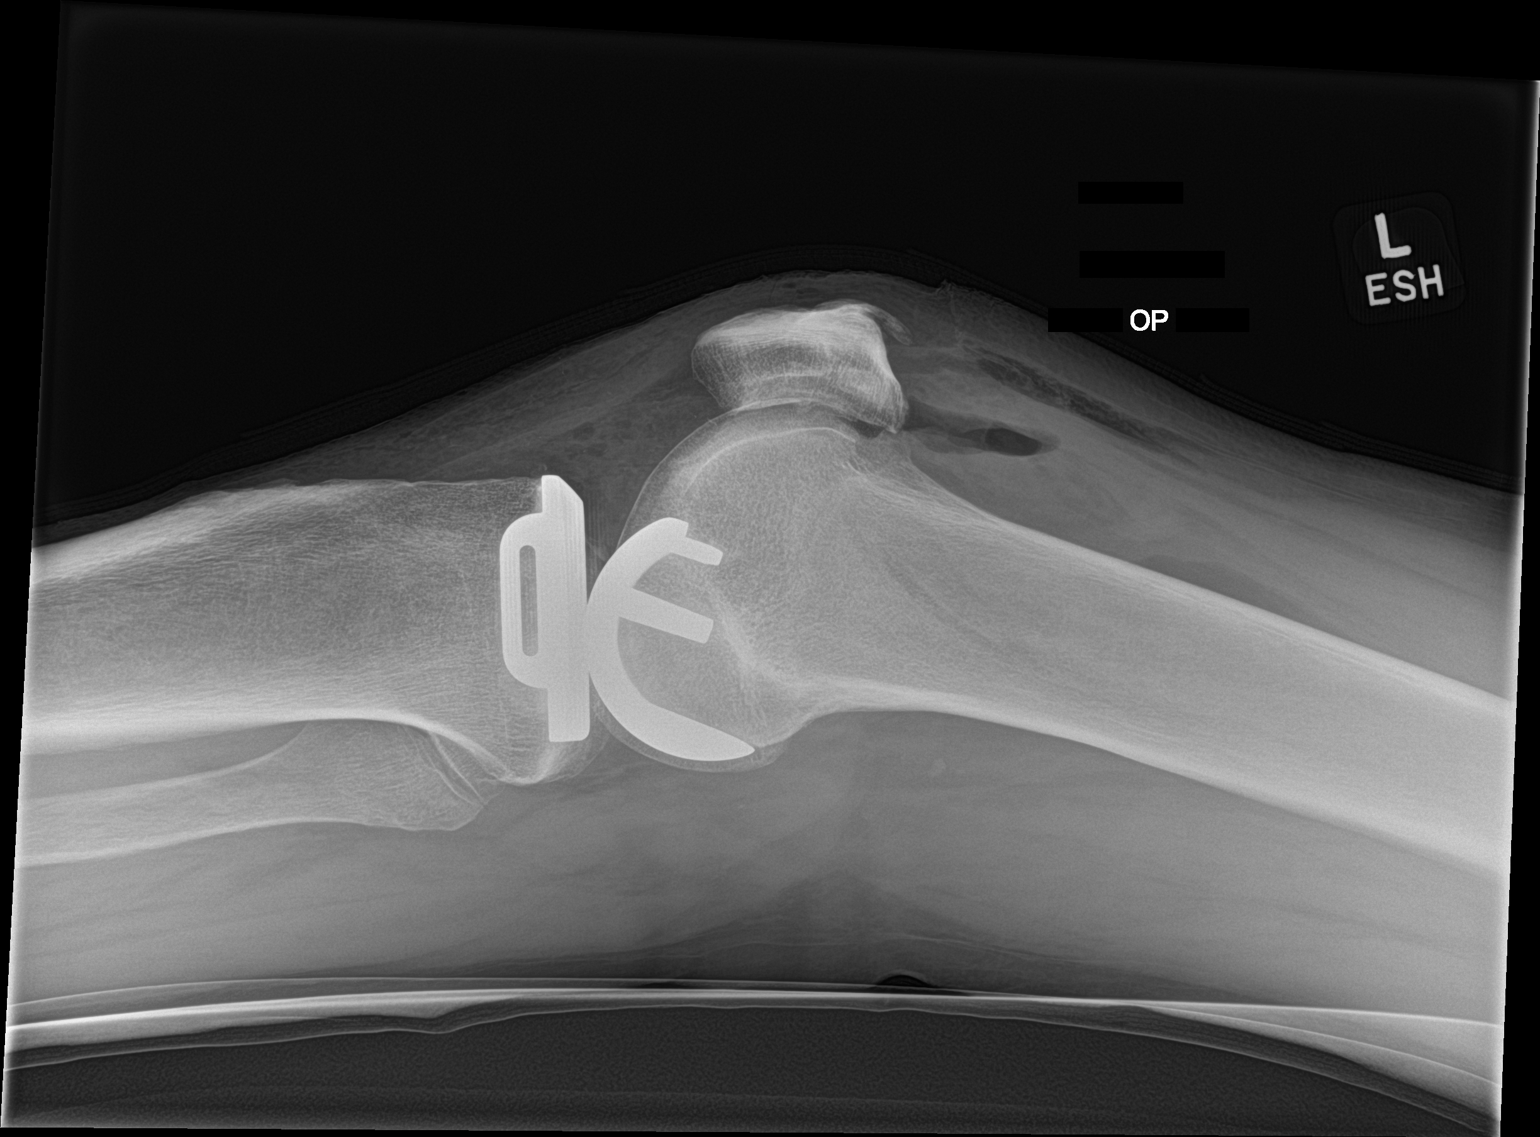

[2 of 2 positions shown; findings below may reference images not displayed]

FINDINGS: Sequelae of medial compartment arthroplasty are identified. The
prosthetic components appear well seated. Postoperative gas is noted
in the knee joint and surrounding soft tissues. No acute fracture or
dislocation is identified. A superior patellar enthesophyte is
noted.
IMPRESSION: Medial compartment arthroplasty without evidence of acute osseous
abnormality.

## 2021-12-17 SURGERY — ARTHROPLASTY, KNEE, UNICOMPARTMENTAL
Anesthesia: Spinal | Site: Knee | Laterality: Left

## 2021-12-17 MED ORDER — CEFAZOLIN SODIUM-DEXTROSE 2-4 GM/100ML-% IV SOLN
INTRAVENOUS | Status: AC
  Administered 2021-12-17: 2 g via INTRAVENOUS
  Filled 2021-12-17: qty 100

## 2021-12-17 MED ORDER — PHENYLEPHRINE HCL-NACL 20-0.9 MG/250ML-% IV SOLN
INTRAVENOUS | Status: DC | PRN
  Administered 2021-12-17: 20 ug/min via INTRAVENOUS

## 2021-12-17 MED ORDER — DEXAMETHASONE SODIUM PHOSPHATE 10 MG/ML IJ SOLN
INTRAMUSCULAR | Status: AC
  Filled 2021-12-17: qty 1

## 2021-12-17 MED ORDER — METHOCARBAMOL 500 MG PO TABS: 500.0000 mg | ORAL_TABLET | Freq: Four times a day (QID) | ORAL | Status: DC | PRN

## 2021-12-17 MED ORDER — ONDANSETRON HCL 4 MG/2ML IJ SOLN
INTRAMUSCULAR | Status: DC | PRN
Start: 2021-12-17 — End: 2021-12-17
  Administered 2021-12-17: 4 mg via INTRAVENOUS

## 2021-12-17 MED ORDER — EPHEDRINE SULFATE-NACL 50-0.9 MG/10ML-% IV SOSY
PREFILLED_SYRINGE | INTRAVENOUS | Status: DC | PRN
  Administered 2021-12-17 (×2): 5 mg via INTRAVENOUS

## 2021-12-17 MED ORDER — DEXAMETHASONE SODIUM PHOSPHATE 10 MG/ML IJ SOLN
8.0000 mg | Freq: Once | INTRAMUSCULAR | Status: AC
  Administered 2021-12-17: 8 mg via INTRAVENOUS

## 2021-12-17 MED ORDER — SODIUM CHLORIDE (PF) 0.9 % IJ SOLN
INTRAMUSCULAR | Status: AC
  Filled 2021-12-17: qty 50

## 2021-12-17 MED ORDER — EPHEDRINE 5 MG/ML INJ
INTRAVENOUS | Status: AC
  Filled 2021-12-17: qty 5

## 2021-12-17 MED ORDER — PROPOFOL 500 MG/50ML IV EMUL
INTRAVENOUS | Status: DC | PRN
  Administered 2021-12-17: 75 ug/kg/min via INTRAVENOUS

## 2021-12-17 MED ORDER — ROPIVACAINE HCL 7.5 MG/ML IJ SOLN
INTRAMUSCULAR | Status: DC | PRN
  Administered 2021-12-17: 20 mL via PERINEURAL

## 2021-12-17 MED ORDER — PROPOFOL 1000 MG/100ML IV EMUL
INTRAVENOUS | Status: AC
  Filled 2021-12-17: qty 100

## 2021-12-17 MED ORDER — ONDANSETRON HCL 4 MG/2ML IJ SOLN: 4.0000 mg | Freq: Once | INTRAMUSCULAR | Status: DC | PRN

## 2021-12-17 MED ORDER — ORAL CARE MOUTH RINSE: 15.0000 mL | Freq: Once | OROMUCOSAL | Status: AC

## 2021-12-17 MED ORDER — LACTATED RINGERS IV BOLUS
250.0000 mL | Freq: Once | INTRAVENOUS | Status: AC
  Administered 2021-12-17: 250 mL via INTRAVENOUS

## 2021-12-17 MED ORDER — FENTANYL CITRATE (PF) 100 MCG/2ML IJ SOLN
INTRAMUSCULAR | Status: DC | PRN
  Administered 2021-12-17 (×2): 50 ug via INTRAVENOUS

## 2021-12-17 MED ORDER — LACTATED RINGERS IV SOLN: INTRAVENOUS | Status: DC

## 2021-12-17 MED ORDER — TRANEXAMIC ACID-NACL 1000-0.7 MG/100ML-% IV SOLN: 1000.0000 mg | Freq: Once | INTRAVENOUS | Status: AC

## 2021-12-17 MED ORDER — CEFAZOLIN SODIUM-DEXTROSE 2-4 GM/100ML-% IV SOLN: 2.0000 g | Freq: Four times a day (QID) | INTRAVENOUS | Status: DC

## 2021-12-17 MED ORDER — FENTANYL CITRATE (PF) 100 MCG/2ML IJ SOLN
INTRAMUSCULAR | Status: AC
  Filled 2021-12-17: qty 2

## 2021-12-17 MED ORDER — OXYCODONE HCL 5 MG PO TABS: 5.0000 mg | ORAL_TABLET | Freq: Once | ORAL | Status: DC | PRN

## 2021-12-17 MED ORDER — POVIDONE-IODINE 10 % EX SWAB: 2.0000 "application " | Freq: Once | CUTANEOUS | Status: DC

## 2021-12-17 MED ORDER — ASPIRIN EC 81 MG PO TBEC: 81.0000 mg | DELAYED_RELEASE_TABLET | Freq: Two times a day (BID) | ORAL | 0 refills | Status: DC

## 2021-12-17 MED ORDER — ONDANSETRON 4 MG PO TBDP: 4.0000 mg | ORAL_TABLET | Freq: Two times a day (BID) | ORAL | 0 refills | Status: DC | PRN

## 2021-12-17 MED ORDER — LIDOCAINE HCL (PF) 2 % IJ SOLN
INTRAMUSCULAR | Status: AC
  Filled 2021-12-17: qty 5

## 2021-12-17 MED ORDER — 0.9 % SODIUM CHLORIDE (POUR BTL) OPTIME
TOPICAL | Status: DC | PRN
  Administered 2021-12-17: 1000 mL

## 2021-12-17 MED ORDER — HYDROMORPHONE HCL 1 MG/ML IJ SOLN: 0.5000 mg | INTRAMUSCULAR | Status: DC | PRN

## 2021-12-17 MED ORDER — METHOCARBAMOL 500 MG IVPB - SIMPLE MED: 500.0000 mg | Freq: Four times a day (QID) | INTRAVENOUS | Status: DC | PRN

## 2021-12-17 MED ORDER — BUPIVACAINE LIPOSOME 1.3 % IJ SUSP
INTRAMUSCULAR | Status: AC
  Filled 2021-12-17: qty 20

## 2021-12-17 MED ORDER — TRANEXAMIC ACID-NACL 1000-0.7 MG/100ML-% IV SOLN
1000.0000 mg | INTRAVENOUS | Status: AC
  Administered 2021-12-17: 1000 mg via INTRAVENOUS
  Filled 2021-12-17: qty 100

## 2021-12-17 MED ORDER — LIDOCAINE 2% (20 MG/ML) 5 ML SYRINGE
INTRAMUSCULAR | Status: DC | PRN
Start: 2021-12-17 — End: 2021-12-17
  Administered 2021-12-17: 40 mg via INTRAVENOUS

## 2021-12-17 MED ORDER — ACETAMINOPHEN 500 MG PO TABS
1000.0000 mg | ORAL_TABLET | Freq: Once | ORAL | Status: AC
  Administered 2021-12-17: 1000 mg via ORAL
  Filled 2021-12-17: qty 2

## 2021-12-17 MED ORDER — TRAMADOL HCL 50 MG PO TABS: 50.0000 mg | ORAL_TABLET | Freq: Four times a day (QID) | ORAL | 0 refills | Status: DC | PRN

## 2021-12-17 MED ORDER — FENTANYL CITRATE PF 50 MCG/ML IJ SOSY: 25.0000 ug | PREFILLED_SYRINGE | INTRAMUSCULAR | Status: DC | PRN

## 2021-12-17 MED ORDER — OXYCODONE HCL 5 MG PO TABS: 10.0000 mg | ORAL_TABLET | ORAL | Status: DC | PRN

## 2021-12-17 MED ORDER — METHOCARBAMOL 750 MG PO TABS: 750.0000 mg | ORAL_TABLET | Freq: Three times a day (TID) | ORAL | 0 refills | Status: DC | PRN

## 2021-12-17 MED ORDER — CHLORHEXIDINE GLUCONATE 0.12 % MT SOLN
15.0000 mL | Freq: Once | OROMUCOSAL | Status: AC
  Administered 2021-12-17: 15 mL via OROMUCOSAL

## 2021-12-17 MED ORDER — CEFAZOLIN SODIUM-DEXTROSE 2-4 GM/100ML-% IV SOLN
2.0000 g | INTRAVENOUS | Status: AC
  Administered 2021-12-17: 2 g via INTRAVENOUS
  Filled 2021-12-17: qty 100

## 2021-12-17 MED ORDER — OXYCODONE HCL 5 MG/5ML PO SOLN: 5.0000 mg | Freq: Once | ORAL | Status: DC | PRN

## 2021-12-17 MED ORDER — BUPIVACAINE IN DEXTROSE 0.75-8.25 % IT SOLN
INTRATHECAL | Status: DC | PRN
  Administered 2021-12-17: 1.6 mL via INTRATHECAL

## 2021-12-17 MED ORDER — TRANEXAMIC ACID-NACL 1000-0.7 MG/100ML-% IV SOLN
INTRAVENOUS | Status: AC
  Administered 2021-12-17: 1000 mg via INTRAVENOUS
  Filled 2021-12-17: qty 100

## 2021-12-17 MED ORDER — PROPOFOL 10 MG/ML IV BOLUS
INTRAVENOUS | Status: DC | PRN
  Administered 2021-12-17: 40 mg via INTRAVENOUS

## 2021-12-17 MED ORDER — GABAPENTIN 300 MG PO CAPS: 300.0000 mg | ORAL_CAPSULE | Freq: Two times a day (BID) | ORAL | 0 refills | Status: DC

## 2021-12-17 MED ORDER — OXYCODONE HCL 5 MG PO TABS: 5.0000 mg | ORAL_TABLET | ORAL | Status: DC | PRN

## 2021-12-17 MED ORDER — METOPROLOL SUCCINATE ER 50 MG PO TB24
50.0000 mg | ORAL_TABLET | ORAL | Status: AC
  Administered 2021-12-17: 50 mg via ORAL
  Filled 2021-12-17: qty 1

## 2021-12-17 MED ORDER — POVIDONE-IODINE 10 % EX SWAB
2.0000 "application " | Freq: Once | CUTANEOUS | Status: AC
  Administered 2021-12-17: 2 via TOPICAL

## 2021-12-17 MED ORDER — LACTATED RINGERS IV BOLUS
500.0000 mL | Freq: Once | INTRAVENOUS | Status: AC
  Administered 2021-12-17: 500 mL via INTRAVENOUS

## 2021-12-17 MED ORDER — ONDANSETRON HCL 4 MG/2ML IJ SOLN
INTRAMUSCULAR | Status: AC
  Filled 2021-12-17: qty 2

## 2021-12-17 MED ORDER — BUPIVACAINE LIPOSOME 1.3 % IJ SUSP: 20.0000 mL | Freq: Once | INTRAMUSCULAR | Status: DC

## 2021-12-17 MED ORDER — SODIUM CHLORIDE 0.9 % IV SOLN
INTRAVENOUS | Status: DC | PRN
  Administered 2021-12-17: 50 mL

## 2021-12-17 MED ORDER — SODIUM CHLORIDE FLUSH 0.9 % IV SOLN
INTRAVENOUS | Status: DC | PRN
Start: 2021-12-17 — End: 2021-12-17
  Administered 2021-12-17: 30 mL

## 2021-12-17 MED ORDER — SODIUM CHLORIDE 0.9 % IR SOLN
Status: DC | PRN
  Administered 2021-12-17: 1000 mL

## 2021-12-17 SURGICAL SUPPLY — 54 items
APL PRP STRL LF DISP 70% ISPRP (MISCELLANEOUS) ×1
BAG COUNTER SPONGE SURGICOUNT (BAG) ×2 IMPLANT
BAG SPNG CNTER NS LX DISP (BAG) ×1
BEARING TIBIAL STRL SZ3 LG (Knees) ×1 IMPLANT
BLADE SURG 15 STRL LF DISP TIS (BLADE) ×1 IMPLANT
BLADE SURG 15 STRL SS (BLADE) ×2
BNDG CMPR MED 10X6 ELC LF (GAUZE/BANDAGES/DRESSINGS) ×1
BNDG ELASTIC 6X10 VLCR STRL LF (GAUZE/BANDAGES/DRESSINGS) ×3 IMPLANT
BOWL SMART MIX CTS (DISPOSABLE) ×3 IMPLANT
BRNG TIB LRG 3 PHS 3 LT MEN (Knees) ×1 IMPLANT
CEMENT BONE R 1X40 (Cement) ×1 IMPLANT
CHLORAPREP W/TINT 26 (MISCELLANEOUS) ×2 IMPLANT
CLSR STERI-STRIP ANTIMIC 1/2X4 (GAUZE/BANDAGES/DRESSINGS) ×3 IMPLANT
COVER SURGICAL LIGHT HANDLE (MISCELLANEOUS) ×2 IMPLANT
CUFF TOURN SGL QUICK 34 (TOURNIQUET CUFF) ×2
CUFF TRNQT CYL 34X4.125X (TOURNIQUET CUFF) ×2 IMPLANT
DRAPE 3/4 80X56 (DRAPES) ×3 IMPLANT
DRAPE ARTHROSCOPY W/POUCH 114 (DRAPES) ×2 IMPLANT
DRAPE INCISE IOBAN 66X45 STRL (DRAPES) ×6 IMPLANT
DRAPE U-SHAPE 47X51 STRL (DRAPES) ×3 IMPLANT
DRESSING MEPILEX FLEX 4X4 (GAUZE/BANDAGES/DRESSINGS) IMPLANT
DRSG MEPILEX BORDER 4X8 (GAUZE/BANDAGES/DRESSINGS) ×2 IMPLANT
DRSG MEPILEX FLEX 4X4 (GAUZE/BANDAGES/DRESSINGS)
ELECT REM PT RETURN 15FT ADLT (MISCELLANEOUS) ×2 IMPLANT
FORCEPS GRASP 3 PRONG 3.2FR (CUTTING FORCEPS) ×2 IMPLANT
GLOVE SRG 8 PF TXTR STRL LF DI (GLOVE) ×2 IMPLANT
GLOVE SURG ENC MOIS LTX SZ7.5 (GLOVE) ×2 IMPLANT
GLOVE SURG POLYISO LF SZ7.5 (GLOVE) ×2 IMPLANT
GLOVE SURG UNDER POLY LF SZ7.5 (GLOVE) ×3 IMPLANT
GLOVE SURG UNDER POLY LF SZ8 (GLOVE) ×2
GOWN STRL REUS W/ TWL LRG LVL3 (GOWN DISPOSABLE) ×1 IMPLANT
GOWN STRL REUS W/TWL LRG LVL3 (GOWN DISPOSABLE) ×2
HANDPIECE INTERPULSE COAX TIP (DISPOSABLE) ×2
IMMOBILIZER KNEE 20 (SOFTGOODS) ×2
IMMOBILIZER KNEE 20 THIGH 36 (SOFTGOODS) ×1 IMPLANT
IMMOBILIZER KNEE 22 UNIV (SOFTGOODS) ×1 IMPLANT
KIT TURNOVER KIT A (KITS) ×1 IMPLANT
MANIFOLD NEPTUNE II (INSTRUMENTS) ×2 IMPLANT
NS IRRIG 1000ML POUR BTL (IV SOLUTION) ×2 IMPLANT
PACK BLADE SAW RECIP 70 3 PT (BLADE) ×1 IMPLANT
PACK TOTAL KNEE CUSTOM (KITS) ×2 IMPLANT
PEG FEMORAL CEMENT STRL LRG (Knees) ×1 IMPLANT
PROTECTOR NERVE ULNAR (MISCELLANEOUS) ×3 IMPLANT
SET HNDPC FAN SPRY TIP SCT (DISPOSABLE) ×1 IMPLANT
SUCTION FRAZIER HANDLE 10FR (MISCELLANEOUS) ×2
SUCTION TUBE FRAZIER 10FR DISP (MISCELLANEOUS) ×1 IMPLANT
SUT MNCRL AB 4-0 PS2 18 (SUTURE) ×2 IMPLANT
SUT VIC AB 0 CT1 36 (SUTURE) ×2 IMPLANT
SUT VIC AB 1 CT1 36 (SUTURE) ×2 IMPLANT
SUT VIC AB 2-0 CT1 27 (SUTURE) ×2
SUT VIC AB 2-0 CT1 TAPERPNT 27 (SUTURE) ×1 IMPLANT
TRAY FOLEY MTR SLVR 16FR STAT (SET/KITS/TRAYS/PACK) ×3 IMPLANT
TRAY TIBIAL OXFORD SZ D LF (Joint) ×1 IMPLANT
WRAP KNEE MAXI GEL POST OP (GAUZE/BANDAGES/DRESSINGS) ×2 IMPLANT

## 2021-12-17 NOTE — Progress Notes (Signed)
Orthopedic Tech Progress Note ?Patient Details:  ?Jimmy Rice ?11-Jan-1946 ?[id number] ? ?Patient ID: Jimmy Rice, male   DOB: [birth year], 76 y.o.   MRN: [id number] ? ?Kennis Carina ?12/17/2021, 12:06 PM ?Bone foam applied to left leg in PACU ?

## 2021-12-17 NOTE — Op Note (Signed)
12/17/2021 ? ?8:54 AM ? ?PATIENT:  Jimmy Rice   ? ?PRE-OPERATIVE DIAGNOSIS:  OA LEFT KNEE ? ?POST-OPERATIVE DIAGNOSIS:  Same ? ?PROCEDURE:  UNICOMPARTMENTAL KNEE ? ?SURGEON:  Renette Butters, MD ? ?PHYSICIAN ASSISTANT: Aggie Moats, PA-C, he was present and scrubbed throughout the case, critical for completion in a timely fashion, and for retraction, instrumentation, and closure. ? ? ?ANESTHESIA:   General ? ?PREOPERATIVE INDICATIONS:  Jimmy Rice is a  76 y.o. male with a diagnosis of OA LEFT KNEE who failed conservative measures and elected for surgical management.   ? ?The risks benefits and alternatives were discussed with the patient preoperatively including but not limited to the risks of infection, bleeding, nerve injury, cardiopulmonary complications, blood clots, the need for revision surgery, among others, and the patient was willing to proceed. ? ?OPERATIVE IMPLANTS: Biomet [place] mobile bearing medial compartment arthroplasty. Femoral Component: lg. Tibial tray: D, Size 3 poly.  ? ?OPERATIVE FINDINGS: Endstage grade 4 medial compartment osteoarthritis. No significant changes in the lateral or patellofemoral joint ? ?OPERATIVE PROCEDURE: The patient was brought to the operating room placed in supine position. General anesthesia was administered. IV antibiotics were given. The lower extremity was placed in the legholder and prepped and draped in usual sterile fashion. ? ?Time out was performed. ? ?The leg was elevated and exsanguinated and the tourniquet was inflated. Anteromedial incision was performed, and I took care to preserve the MCL. Parapatellar incision was carried out, and the osteophytes were excised, along with the medial meniscus and a small portion of the fat pad. ? ?The extra medullary tibial cutting jig was applied, using the spoon and the 43m G-Clamp, and I took care to protect the anterior cruciate ligament insertion and the tibial spine. The medial collateral ligament was also  protected, and I resected my proximal tibia, matching the anatomic slope.  ? ?The proximal tibial bony cut was removed in one piece, and I turned my attention to the femur. ? ?The intramedullary femoral rod was placed using the drill, and then using the appropriate reference, I assembled the femoral jig, setting my posterior cutting block. I resected my posterior femur, and then measured my gap.  ? ?I then used the mill to match the extension gap to the flexion gap. The gaps were then measured again with the appropriate feeler gauges. Once I had balanced flexion and extension gaps, I then completed the preparation of the femur. ? ?I milled off the anterior aspect of the distal femur to prevent impingement. I also exposed the tibia, and selected the above-named component, and then used the cutting jig to prepare the keel slot on the tibia. I also used the awl to curette out the bone to complete the preparation of the keel. The back wall was intact. ? ?I then placed trial components, and it was found to have excellent motion, and appropriate balance. ? ?I then cemented the components into place, cementing the tibia first, removing all excess cement, and then cementing the femur.  All loose cement was removed. ? ?The real polyethylene insert was applied manually, and the knee was taken through functional range of motion, and found to have excellent stability and restoration of joint motion, with excellent balance. ? ?The wounds were irrigated copiously, and the parapatellar tissue closed with Vicryl, followed by Vicryl for the subcutaneous tissue, with routine closure with Steri-Strips and sterile gauze. ? ?The tourniquet was released, and the patient was awakened and extubated and returned to PACU in  stable and satisfactory condition. There were no complications. ? ?POSTOPERATIVE PLAN: DVT px will consist of SCD's, mobiliation and chemical px, WBAT ? ? ? ? ?Renette Butters, MD ? ?

## 2021-12-17 NOTE — Anesthesia Procedure Notes (Signed)
Spinal ? ?Patient location during procedure: OR ?Start time: 12/17/2021 7:30 AM ?End time: 12/17/2021 7:37 AM ?Reason for block: surgical anesthesia ?Preanesthetic Checklist ?Completed: patient identified, IV checked, site marked, risks and benefits discussed, surgical consent, monitors and equipment checked, pre-op evaluation and timeout performed ?Spinal Block ?Patient position: sitting ?Prep: DuraPrep ?Patient monitoring: heart rate, cardiac monitor, continuous pulse ox and blood pressure ?Approach: midline ?Location: L3-4 ?Injection technique: single-shot ?Needle ?Needle type: Pencan  ?Needle gauge: 24 G ?Needle length: 10 cm ?Needle insertion depth: 9 cm ?Assessment ?Sensory level: T4 ?Events: CSF return ? ? ? ?

## 2021-12-17 NOTE — Progress Notes (Signed)
Pt unable to void after surgery, Foley d/c'd at the end of the case. States he feels the urge to void but is unable. Bladder scan revealed 546m. Notified MAggie Moats PA for Dr. MPercell Miller She will come to assess patient. ACoolidge Breeze RN 12/17/2021 ? ?

## 2021-12-17 NOTE — Anesthesia Postprocedure Evaluation (Signed)
Anesthesia Post Note ? ?Patient: Jimmy Rice ? ?Procedure(s) Performed: UNICOMPARTMENTAL KNEE (Left: Knee) ? ?  ? ?Patient location during evaluation: PACU ?Anesthesia Type: Spinal ?Level of consciousness: oriented and awake and alert ?Pain management: pain level controlled ?Vital Signs Assessment: post-procedure vital signs reviewed and stable ?Respiratory status: spontaneous breathing, respiratory function stable and nonlabored ventilation ?Cardiovascular status: blood pressure returned to baseline and stable ?Postop Assessment: no headache, no backache, no apparent nausea or vomiting, spinal receding and patient able to bend at knees ?Anesthetic complications: no ? ? ?No notable events documented. ? ?Last Vitals:  ?Vitals:  ? 12/17/21 1045 12/17/21 1100  ?BP: 123/73 122/72  ?Pulse: (!) 59 60  ?Resp: (!) 9 16  ?Temp:  (!) 36.3 ?C  ?SpO2: 98% 99%  ?  ?Last Pain:  ?Vitals:  ? 12/17/21 1100  ?TempSrc:   ?PainSc: 0-No pain  ? ? ?  ?  ?  ?  ?  ?  ? ?Sharilynn Cassity A. ? ? ? ? ?

## 2021-12-17 NOTE — Interval H&P Note (Signed)
History and Physical Interval Note: ? ?12/17/2021 ?7:13 AM ? ?Jimmy Rice  has presented today for surgery, with the diagnosis of OA LEFT KNEE.  The various methods of treatment have been discussed with the patient and family. After consideration of risks, benefits and other options for treatment, the patient has consented to  Procedure(s): ?UNICOMPARTMENTAL KNEE (Left) as a surgical intervention.  The patient's history has been reviewed, patient examined, no change in status, stable for surgery.  I have reviewed the patient's chart and labs.  Questions were answered to the patient's satisfaction.   ? ? ?Renette Butters ? ? ?

## 2021-12-17 NOTE — Anesthesia Procedure Notes (Signed)
Anesthesia Regional Block: Adductor canal block  ? ?Pre-Anesthetic Checklist: , timeout performed,  Correct Patient, Correct Site, Correct Laterality,  Correct Procedure, Correct Position, site marked,  Risks and benefits discussed,  Surgical consent,  Pre-op evaluation,  At surgeon's request and post-op pain management ? ?Laterality: Left ? ?Prep: chloraprep     ?  ?Needles:  ?Injection technique: Single-shot ? ?Needle Type: Echogenic Stimulator Needle   ? ? ?Needle Length: 10cm  ?Needle Gauge: 21  ? ?Needle insertion depth: 6 cm ? ? ?Additional Needles: ? ? ?Procedures:,,,, ultrasound used (permanent image in chart),,    ?Narrative:  ?Start time: 12/17/2021 7:14 AM ?End time: 12/17/2021 7:19 AM ?Injection made incrementally with aspirations every 5 mL. ? ?Performed by: Personally  ?Anesthesiologist: Josephine Igo, MD ? ?Additional Notes: ?Timeout performed. Patient sedated. Relevant anatomy ID'd using [place]. Incremental 2-53m injection of LA with frequent aspiration. Patient tolerated procedure well. ? ? ? ? ?Left Adductor Canal Block ? ?

## 2021-12-17 NOTE — Evaluation (Signed)
Physical Therapy Evaluation ?Patient Details ?Name: Jimmy Rice ?MRN: [id number] ?DOB: 17-Jun-1946 ?Today's Date: 12/17/2021 ? ?History of Present Illness ? Patient is 76 y.o. male s/p Lt UKA on 12/17/21 with PMH significant for OA, BPH, prostate cancer s/p prostate resection, CKDIII, GERD, HTN, Rt UKA in 2017. ? ?  ?Clinical Impression ? Jimmy Rice is a 76 y.o. male POD 0 s/p Lt UKA. Patient reports independence with mobility at baseline. Patient is now limited by functional impairments (see PT problem list below) and requires min guard/supervision for bed mobility, transfers, and gait with RW. Patient was able to ambulate ~80 feet with RW and min guard and cues for safe walker management. Patient instructed in exercises to facilitate ROM and circulation. Patient will benefit from continued skilled PT interventions to address impairments and progress towards PLOF. Patient has met mobility goals at adequate level for discharge home; will continue to follow if pt continues acute stay to progress towards Mod I goals.    ?   ? ?Recommendations for follow up therapy are one component of a multi-disciplinary discharge planning process, led by the attending physician.  Recommendations may be updated based on patient status, additional functional criteria and insurance authorization. ? ?Follow Up Recommendations Follow physician's recommendations for discharge plan and follow up therapies ? ?  ?Assistance Recommended at Discharge Frequent or constant Supervision/Assistance  ?Patient can return home with the following ? A little help with walking and/or transfers;A little help with bathing/dressing/bathroom;Assistance with cooking/housework;Assist for transportation;Help with stairs or ramp for entrance ? ?  ?Equipment Recommendations None recommended by PT  ?Recommendations for Other Services ?    ?  ?Functional Status Assessment Patient has had a recent decline in their functional status and demonstrates the ability  to make significant improvements in function in a reasonable and predictable amount of time.  ? ?  ?Precautions / Restrictions Precautions ?Precautions: Knee;Fall ?Restrictions ?Weight Bearing Restrictions: Yes ?LLE Weight Bearing: Weight bearing as tolerated  ? ?  ? ?Mobility ? Bed Mobility ?Overal bed mobility: Needs Assistance ?Bed Mobility: Supine to Sit ?  ?  ?Supine to sit: Supervision, HOB elevated ?  ?  ?General bed mobility comments: HOB slightly elevated, pt taking extra time and supervision for safety. ?  ? ?Transfers ?Overall transfer level: Needs assistance ?Equipment used: Rolling walker (2 wheels) ?Transfers: Sit to/from Stand ?Sit to Stand: Min guard ?  ?  ?  ?  ?  ?General transfer comment: cues for technique/hand placement. no assist needed for power up and pt steady with rise. pt able to control lower to sit in recliner, cues to extend Lt knee. ?  ? ?Ambulation/Gait ?Ambulation/Gait assistance: Min guard ?Gait Distance (Feet): 80 Feet ?Assistive device: Rolling walker (2 wheels) ?Gait Pattern/deviations: Step-to pattern, Decreased stride length, Decreased weight shift to left, Decreased stance time - left, Narrow base of support ?Gait velocity: decr ?  ?  ?General Gait Details: pt required min assist at start due to slight sway 2/2 hip weakness. balance improved with pre-gait stepping at EOB. pt amb to bathroom and back, cues for step pattern/proximity to RW. no LOB noted throughout and no buckling on ? ?Stairs ?  ?  ?  ?  ?  ? ?Wheelchair Mobility ?  ? ?Modified Rankin (Stroke Patients Only) ?  ? ?  ? ?Balance   ?  ?  ?  ?  ?  ?  ?  ?  ?  ?  ?  ?  ?  ?  ?  ?  ?  ?  ?   ? ? ? ?  Pertinent Vitals/Pain Pain Assessment ?Pain Assessment: 0-10 ?Pain Score: 0-No pain ?Pain Location: left knee ?Pain Intervention(s): Limited activity within patient's tolerance, Monitored during session, Repositioned  ? ? ?Home Living Family/patient expects to be discharged to:: Private residence ?Living Arrangements:  Spouse/significant other ?Available Help at Discharge: Family ?Type of Home: House ?Home Access: Level entry ?  ?  ?  ?Home Layout: One level ?Home Equipment: Shower seat - built Medical sales representative (2 wheels) ?   ?  ?Prior Function Prior Level of Function : Independent/Modified Independent ?  ?  ?  ?  ?  ?  ?Mobility Comments: Enjoys golf ?  ?  ? ? ?Hand Dominance  ? Dominant Hand: Right ? ?  ?Extremity/Trunk Assessment  ? Upper Extremity Assessment ?Upper Extremity Assessment: Overall WFL for tasks assessed ?  ? ?Lower Extremity Assessment ?Lower Extremity Assessment: Overall WFL for tasks assessed;LLE deficits/detail ?LLE Deficits / Details: good quad activation, no extenor lag with SLR ?LLE Sensation: WNL ?LLE Coordination: WNL ?  ? ?Cervical / Trunk Assessment ?Cervical / Trunk Assessment: Normal  ?Communication  ? Communication: No difficulties  ?Cognition Arousal/Alertness: Awake/alert ?Behavior During Therapy: [facility] for tasks assessed/performed ?Overall Cognitive Status: Within Functional Limits for tasks assessed ?  ?  ?  ?  ?  ?  ?  ?  ?  ?  ?  ?  ?  ?  ?  ?  ?  ?  ?  ? ?  ?General Comments   ? ?  ?Exercises Total Joint Exercises ?Ankle Circles/Pumps: AROM, Both, 5 reps ?Quad Sets: AROM, Both, 5 reps ?Short Arc Quad: Left, 5 reps ?Heel Slides: Left, 5 reps ?Hip ABduction/ADduction: Left, 5 reps ?Straight Leg Raises: Left, 5 reps  ? ?Assessment/Plan  ?  ?PT Assessment Patient needs continued PT services  ?PT Problem List   ? ?   ?  ?PT Treatment Interventions DME instruction;Gait training;Stair training;Functional mobility training;Therapeutic activities;Balance training;Therapeutic exercise;Patient/family education;Neuromuscular re-education   ? ?PT Goals (Current goals can be found in the Care Plan section)  ?Acute Rehab PT Goals ?Patient Stated Goal: go home, be able to urinate ?PT Goal Formulation: With patient ?Time For Goal Achievement: 12/24/21 ?Potential to Achieve Goals: Good ? ?  ?Frequency 7X/week ?   ? ? ?Co-evaluation   ?  ?  ?  ?  ? ? ?  ?AM-PAC PT "6 Clicks" Mobility  ?Outcome Measure Help needed turning from your back to your side while in a flat bed without using bedrails?: A Little ?Help needed moving from lying on your back to sitting on the side of a flat bed without using bedrails?: A Little ?Help needed moving to and from a bed to a chair (including a wheelchair)?: A Little ?Help needed standing up from a chair using your arms (e.g., wheelchair or bedside chair)?: A Little ?Help needed to walk in hospital room?: A Little ?Help needed climbing 3-5 steps with a railing? : A Little ?6 Click Score: 18 ? ?  ?End of Session Equipment Utilized During Treatment: Gait belt ?Activity Tolerance: Patient tolerated treatment well ?Patient left: in chair;with call bell/phone within reach ?Nurse Communication: Mobility status (needs to void bladder) ?PT Visit Diagnosis: Muscle weakness (generalized) (M62.81);Difficulty in walking, not elsewhere classified (R26.2) ?  ? ?Time: 9179-1505 ?PT Time Calculation (min) (ACUTE ONLY): 27 min ? ? ?Charges:   PT Evaluation ?$PT Eval Low Complexity: 1 Low ?PT Treatments ?$Therapeutic Exercise: 8-22 mins ?  ?   ? ? ?Gwynneth Albright PT, DPT ?  Acute Rehabilitation Services ?Office [phone] ?Pager [phone]  ? ?Jimmy Rice ?12/17/2021, 3:29 PM ?

## 2021-12-17 NOTE — Anesthesia Procedure Notes (Signed)
Procedure Name: [place] ?Date/Time: 12/17/2021 7:37 AM ?Performed by: Claudia Desanctis, CRNA ?Pre-anesthesia Checklist: Patient identified, Emergency Drugs available, Suction available and Patient being monitored ?Patient Re-evaluated:Patient Re-evaluated prior to induction ?Oxygen Delivery Method: Simple face mask ? ? ? ? ?

## 2021-12-17 NOTE — Transfer of Care (Signed)
Immediate Anesthesia Transfer of Care Note ? ?Patient: Jimmy Rice ? ?Procedure(s) Performed: UNICOMPARTMENTAL KNEE (Left: Knee) ? ?Patient Location: PACU ? ?Anesthesia Type:Spinal ? ?Level of Consciousness: awake and patient cooperative ? ?Airway & Oxygen Therapy: Patient Spontanous Breathing and Patient connected to face mask ? ?Post-op Assessment: Report given to RN and Post -op Vital signs reviewed and stable ? ?Post vital signs: Reviewed and stable ? ?Last Vitals:  ?Vitals Value Taken Time  ?BP 143/76 12/17/21 0937  ?Temp    ?Pulse    ?Resp 25 12/17/21 0939  ?SpO2    ?Vitals shown include unvalidated device data. ? ?Last Pain:  ?Vitals:  ? 12/17/21 0607  ?TempSrc:   ?PainSc: 0-No pain  ?   ? ?  ? ?Complications: No notable events documented. ?

## 2021-12-17 NOTE — Discharge Instructions (Addendum)

## 2021-12-18 ENCOUNTER — Encounter (HOSPITAL_COMMUNITY): Payer: Self-pay | Admitting: Orthopedic Surgery

## 2021-12-24 ENCOUNTER — Other Ambulatory Visit: Payer: Self-pay | Admitting: Family Medicine

## 2022-01-01 ENCOUNTER — Other Ambulatory Visit: Payer: Self-pay | Admitting: Family Medicine

## 2022-01-31 ENCOUNTER — Ambulatory Visit (INDEPENDENT_AMBULATORY_CARE_PROVIDER_SITE_OTHER): Payer: Medicare (Managed Care) | Admitting: Cardiovascular Disease

## 2022-01-31 ENCOUNTER — Encounter: Payer: Self-pay | Admitting: Cardiovascular Disease

## 2022-01-31 VITALS — BP 128/70 | HR 69 | Ht 72.0 in | Wt 194.2 lb

## 2022-01-31 DIAGNOSIS — I493 Ventricular premature depolarization: Secondary | ICD-10-CM | POA: Diagnosis not present

## 2022-01-31 DIAGNOSIS — I10 Essential (primary) hypertension: Secondary | ICD-10-CM

## 2022-01-31 DIAGNOSIS — E785 Hyperlipidemia, unspecified: Secondary | ICD-10-CM

## 2022-01-31 NOTE — Progress Notes (Signed)
?  ?Cardiology Office Note ? ? ?Date:  01/31/2022  ? ?ID:  Jimmy Rice, DOB 01/03/1946, MRN [id number] ? ?PCP:  Eulas Post, MD  ?Cardiologist:   Kathlyn Sacramento, MD  ? ?Chief Complaint  ?Patient presents with  ? Other  ?  No complaints today. Meds reviewed verbally with pt.  ? ? ? ?  ?History of Present Illness: ?Jimmy Rice is a 76 y.o. male who is here today for follow-up visit regarding symptomatic PVCs.  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 ?Previous cardiac work-up including stress testing and echocardiogram were unremarkable.  PVCs are being treated with Toprol with excellent control.   ?Right ?He has been doing very well with no chest pain, shortness of breath or palpitations.  He is here today because he was told he has a cardiac murmur needing evaluation.  He was told about a heart murmur many years ago when he was in his 28s.  ? ? ? ?Past Medical History:  ?Diagnosis Date  ? Arthritis   ? "right knee" (07/22/2016)  ? BENIGN PROSTATIC HYPERTROPHY, HX OF 04/04/2009  ? Blood transfusion   ? Cancer [facility])   ? hx of porstate cancer  ? Childhood asthma   ? pt denies on proep of 12/10/21  ? Chronic kidney disease (CKD), stage III (moderate) (HCC)   ? 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 Complication of anesthesia   ? woke up "fighting"  confusion  ? GERD 01/18/2009  ? HYPERTENSION 01/18/2009  ? HYPOGONADISM 01/18/2009  ? ? ?Past Surgical History:  ?Procedure Laterality Date  ? COLONOSCOPY    ? INGUINAL HERNIA REPAIR Right 1997  ? NASAL SINUS SURGERY  1997  ? PARTIAL KNEE ARTHROPLASTY Right 07/22/2016  ? Procedure: UNICOMPARTMENTAL KNEE ARTHROPLASTY;  Surgeon: Renette Butters, MD;  Location: [place];  Service: Orthopedics;  Laterality: Right;  ? PARTIAL KNEE ARTHROPLASTY Left 12/17/2021  ? Procedure: UNICOMPARTMENTAL KNEE;  Surgeon: Renette Butters, MD;  Location: [facility];   Service: Orthopedics;  Laterality: Left;  ? PROSTATE BIOPSY  X 5  ? REPLACEMENT UNICONDYLAR JOINT KNEE Right 07/22/2016  ? TRANSURETHRAL RESECTION OF PROSTATE  12/2014  ? ? ? ?Current Outpatient Medications  ?Medication Sig Dispense Refill  ? atorvastatin (LIPITOR) 20 MG tablet TAKE 1 TABLET BY MOUTH EVERY DAY 90 tablet 1  ? diltiazem (CARDIZEM CD) 120 MG 24 hr capsule Take 1 capsule (120 mg total) by mouth daily. 90 capsule 3  ? losartan-hydrochlorothiazide (HYZAAR) 100-12.5 MG tablet TAKE 1 TABLET DAILY 90 tablet 3  ? metoprolol succinate (TOPROL-XL) 50 MG 24 hr tablet TAKE 1 TABLET DAILY WITH OR IMMEDIATELY FOLLOWING A MEAL 90 tablet 3  ? ?No current facility-administered medications for this visit.  ? ? ?Allergies:   Amlodipine, Flomax [tamsulosin hcl], and Tamsulosin  ? ? ?Social History:  The patient  reports that he has quit smoking. His smoking use included cigarettes. He has never used smokeless tobacco. He reports that he does not currently use alcohol after a past usage of about 1.0 standard drink per week. He reports that he does not use drugs.  ? ?Family History:  The patient's family history includes Bladder Cancer in his brother; Breast cancer in his mother and sister; Kidney disease in his father; Stomach cancer in his paternal aunt.  ? ? ?ROS:  Please see the history of present illness.  Otherwise, review of systems are positive for none.   All other systems are reviewed and negative.  ? ? ?PHYSICAL EXAM: ?VS:  BP 128/70 (BP Location: Left Arm, Patient Position: Sitting, Cuff Size: Normal)   Pulse 69   Ht 6' (1.829 m)   Wt 194 lb 4 oz (88.1 kg)   SpO2 99%   BMI 26.35 kg/m?  , BMI Body mass index is 26.35 kg/m?. ?GEN: Well nourished, well developed, in no acute distress  ?HEENT: normal  ?Neck: no JVD, carotid bruits, or masses ?Cardiac: RRR; no murmurs, rubs, or gallops,no edema  ?Respiratory:  clear to auscultation bilaterally, normal work of breathing ?GI: soft, nontender, nondistended, +  BS ?MS: no deformity or atrophy  ?Skin: warm and dry, no rash ?Neuro:  Strength and sensation are intact ?Psych: euthymic mood, full affect ? ? ?EKG:  EKG is ordered today. ?The ekg ordered today demonstrates normal sinus rhythm with no significant ST or T wave changes.  No PVCs.  Minimal LVH.  1 PAC ? ? ?Recent Labs: ?12/10/2021: BUN 29; Creatinine, Ser 1.69; Potassium 3.8; Sodium 136 ?12/17/2021: Hemoglobin 13.2; Platelets 155  ? ? ?Lipid Panel ?   ?Component Value Date/Time  ? CHOL 148 04/03/2021 1341  ? TRIG 159.0 (H) 04/03/2021 1341  ? HDL 41.30 04/03/2021 1341  ? CHOLHDL 4 04/03/2021 1341  ? VLDL 31.8 04/03/2021 1341  ? [place] 74 04/03/2021 1341  ? LDLDIRECT 106.0 05/19/2018 1510  ? ?  ? ?Wt Readings from Last 3 Encounters:  ?01/31/22 194 lb 4 oz (88.1 kg)  ?12/17/21 200 lb (90.7 kg)  ?12/11/21 203 lb 6.4 oz (92.3 kg)  ?  ? ? ?  07/14/2019  ?  8:29 AM  ?PAD Screen  ?Previous PAD dx? No  ?Previous surgical procedure? No  ?Pain with walking? No  ?Feet/toe relief with dangling? No  ?Painful, non-healing ulcers? No  ?Extremities discolored? No  ? ? ? ? ?ASSESSMENT AND PLAN: ? ?1.  Symptomatic PVCs: Symptoms are  well controlled on current dose of Toprol.  No PVCs by physical exam or on EKG. ? ?2.  Essential hypertension: Blood pressure is reasonably controlled on current medications. ? ?3.  Hyperlipidemia: Currently on atorvastatin 20 mg daily.  Most recent lipid profile showed an LDL of 74. ? ?4.  Chronic kidney disease: Followed by nephrology and has been stable.  ? ?5.  Possible functional cardiac murmur: I do not hear cardiac murmurs by exam today.  It was noted recently at the [place] but not today by exam.  No need for further work-up of this. ? ? ?Disposition:   FU with me in 12 months ? ?Signed, ? ?Kathlyn Sacramento, MD  ?01/31/2022 9:57 AM    ?[place] ?

## 2022-01-31 NOTE — Patient Instructions (Signed)

## 2022-02-07 ENCOUNTER — Encounter: Payer: Self-pay | Admitting: Family Medicine

## 2022-02-07 ENCOUNTER — Ambulatory Visit (INDEPENDENT_AMBULATORY_CARE_PROVIDER_SITE_OTHER): Payer: Medicare (Managed Care) | Admitting: Family Medicine

## 2022-02-07 VITALS — BP 126/66 | HR 67 | Temp 98.0°F | Ht 71.26 in | Wt 198.0 lb

## 2022-02-07 DIAGNOSIS — Z0000 Encounter for general adult medical examination without abnormal findings: Secondary | ICD-10-CM | POA: Diagnosis not present

## 2022-02-07 LAB — CBC WITH DIFFERENTIAL/PLATELET
Basophils Absolute: 0 10*3/uL (ref 0.0–0.1)
Basophils Relative: 0.7 % (ref 0.0–3.0)
Eosinophils Absolute: 0.2 10*3/uL (ref 0.0–0.7)
Eosinophils Relative: 4.8 % (ref 0.0–5.0)
HCT: 40.2 % (ref 39.0–52.0)
Hemoglobin: 13 g/dL (ref 13.0–17.0)
Lymphocytes Relative: 34.6 % (ref 12.0–46.0)
Lymphs Abs: 1.4 10*3/uL (ref 0.7–4.0)
MCHC: 32.4 g/dL (ref 30.0–36.0)
MCV: 93 fl (ref 78.0–100.0)
Monocytes Absolute: 0.4 10*3/uL (ref 0.1–1.0)
Monocytes Relative: 9.9 % (ref 3.0–12.0)
Neutro Abs: 2 10*3/uL (ref 1.4–7.7)
Neutrophils Relative %: 50 % (ref 43.0–77.0)
Platelets: 186 10*3/uL (ref 150.0–400.0)
RBC: 4.33 Mil/uL (ref 4.22–5.81)
RDW: 13.4 % (ref 11.5–15.5)
WBC: 4 10*3/uL (ref 4.0–10.5)

## 2022-02-07 LAB — HEPATIC FUNCTION PANEL
ALT: 15 U/L (ref 0–53)
AST: 14 U/L (ref 0–37)
Albumin: 4.3 g/dL (ref 3.5–5.2)
Alkaline Phosphatase: 63 U/L (ref 39–117)
Bilirubin, Direct: 0.2 mg/dL (ref 0.0–0.3)
Total Bilirubin: 1.2 mg/dL (ref 0.2–1.2)
Total Protein: 7.3 g/dL (ref 6.0–8.3)

## 2022-02-07 LAB — LIPID PANEL
Cholesterol: 145 mg/dL (ref 0–200)
HDL: 44.2 mg/dL (ref >39.00)
LDL Cholesterol: 76 mg/dL (ref 0–99)
NonHDL: 100.92
Total CHOL/HDL Ratio: 3
Triglycerides: 126 mg/dL (ref 0.0–149.0)
VLDL: 25.2 mg/dL (ref 0.0–40.0)

## 2022-02-07 LAB — BASIC METABOLIC PANEL
BUN: 22 mg/dL (ref 6–23)
CO2: 30 mEq/L (ref 19–32)
Calcium: 9.6 mg/dL (ref 8.4–10.5)
Chloride: 101 mEq/L (ref 96–112)
Creatinine, Ser: 1.74 mg/dL — ABNORMAL HIGH (ref 0.40–1.50)
GFR: 37.66 mL/min — ABNORMAL LOW (ref >60.00)
Glucose, Bld: 137 mg/dL — ABNORMAL HIGH (ref 70–99)
Potassium: 4 mEq/L (ref 3.5–5.1)
Sodium: 140 mEq/L (ref 135–145)

## 2022-02-07 LAB — TSH: TSH: 4.29 u[IU]/mL (ref 0.35–5.50)

## 2022-02-07 LAB — HEMOGLOBIN A1C: Hgb A1c MFr Bld: 5.8 % (ref 4.6–6.5)

## 2022-02-07 LAB — PSA: PSA: 9.73 ng/mL — ABNORMAL HIGH (ref 0.10–4.00)

## 2022-02-07 NOTE — Patient Instructions (Signed)
Consider Shingrix and check with insurance if interested     ?

## 2022-02-07 NOTE — Progress Notes (Signed)
? ?Established Patient Office Visit ? ?Subjective   ?Patient ID: Jimmy Rice, male    DOB: [birth year]  Age: 76 y.o. MRN: [id number] ? ?Chief Complaint  ?Patient presents with  ? Annual Exam  ? ? ?HPI ? ? ?Here for complete physical.  He has history of hypertension, GERD, osteoarthritis, chronic kidney disease, BPH, prostate cancer.  He had recent left total knee replacement.  Recovering fairly well.  Still has some soreness but is progressing.  He has been seen by multiple urologist in the past.  He has had previous UroLift procedure as well as TURP.  Has recently had some obstructive urinary symptoms and they have suggested him considering repeat TURP.  He is undecided at this point.  His last PSA was 9.4.  He is requesting repeat today.  He has had prediabetes range blood sugars in the past with previous A1c 5.7%. ? ?Health maintenance reviewed.  Up-to-date with exception of no Shingrix vaccine.  He did have Zostavax 2013.  Pneumonia vaccines complete.  Tetanus up-to-date.  Aged out of further colonoscopies. ? ?Family history and social history reviewed with no significant changes ? ?Review of Systems  ?Constitutional:  Negative for chills, fever, malaise/fatigue and weight loss.  ?Respiratory:  Negative for cough, hemoptysis, shortness of breath and wheezing.   ?Cardiovascular:  Negative for chest pain.  ?Gastrointestinal:  Negative for abdominal pain, heartburn and nausea.  ?Genitourinary:   ?     He has nocturia usually 2-3 times per night.  Some slow stream.  Sees urologist.  ?Neurological:  Negative for dizziness.  ?Psychiatric/Behavioral:  Negative for depression.   ? ?  ?Objective:  ?  ? ?BP 126/66 (BP Location: Left Arm, Patient Position: Sitting, Cuff Size: Normal)   Pulse 67   Temp 98 ?F (36.7 ?C) (Oral)   Ht 5' 11.26" (1.81 m)   Wt 198 lb (89.8 kg)   SpO2 97%   BMI 27.41 kg/m?  ? ? ?Physical Exam ?Vitals reviewed.  ?Constitutional:   ?   Appearance: Normal appearance.  ?HENT:  ?   Ears:  ?    Comments: Minimal cerumen both ear canals ?Cardiovascular:  ?   Rate and Rhythm: Normal rate and regular rhythm.  ?Pulmonary:  ?   Effort: Pulmonary effort is normal.  ?   Breath sounds: Normal breath sounds.  ?Abdominal:  ?   Palpations: Abdomen is soft. There is no mass.  ?   Tenderness: There is no abdominal tenderness.  ?Musculoskeletal:  ?   Cervical back: Neck supple.  ?   Right lower leg: No edema.  ?   Left lower leg: No edema.  ?Lymphadenopathy:  ?   Cervical: No cervical adenopathy.  ?Skin: ?   Findings: No rash.  ?Neurological:  ?   General: No focal deficit present.  ?   Mental Status: He is alert.  ?Psychiatric:     ?   Mood and Affect: Mood normal.     ?   Thought Content: Thought content normal.  ? ? ? ?No results found for any visits on 02/07/22. ? ? ? ?The 10-year ASCVD risk score (Arnett DK, et al., 2019) is: 32.5%* (Cholesterol units were assumed) ? ?  ?Assessment & Plan:  ? ?Problem List Items Addressed This Visit   ?None ?Visit Diagnoses   ? ? Physical exam    -  Primary  ? Relevant Orders  ? Basic metabolic panel  ? Lipid panel  ? CBC with Differential/Platelet  ? TSH  ?  Hepatic function panel  ? PSA  ? Hemoglobin A1c  ? ?  ?Patient has chronic medical problems as above.  He has elevated PSA and past history of prostate cancer and is followed by urology.  He is requesting repeat PSA today.  We will also add A1c to labs with prior history of prediabetes range blood sugars. ? ?-We have asked that he inquire about Shingrix vaccine and he will consider if he has coverage ?-Continue annual flu vaccine ?-Aged out of further colonoscopies. ? ?No follow-ups on file.  ? ? ?Carolann Littler, MD ? ?

## 2022-05-21 ENCOUNTER — Telehealth: Payer: Self-pay | Admitting: Family Medicine

## 2022-05-21 MED ORDER — LOSARTAN POTASSIUM-HCTZ 100-12.5 MG PO TABS: 1.0000 | ORAL_TABLET | Freq: Every day | ORAL | 0 refills | Status: DC

## 2022-05-21 MED ORDER — METOPROLOL SUCCINATE ER 50 MG PO TB24: ORAL_TABLET | ORAL | 0 refills | Status: DC

## 2022-05-21 NOTE — Telephone Encounter (Signed)
Pt's wife called to say Pt is in [place] and running out of medication. They are asking if MD could please send the following prescriptions to a pharmacy in [place]:  losartan-hydrochlorothiazide (HYZAAR) 100-12.5 MG tablet  metoprolol succinate (TOPROL-XL) 50 MG 24 hr tablet  [id number] - [place], [address] AT [address] Phone:  [phone]  Fax:  [phone]     Please advise.  Ms. Baker Janus (Wife) [phone]

## 2022-05-21 NOTE — Telephone Encounter (Signed)
Rx sent 

## 2022-05-30 ENCOUNTER — Other Ambulatory Visit: Payer: Self-pay | Admitting: Family Medicine

## 2022-06-24 ENCOUNTER — Ambulatory Visit (INDEPENDENT_AMBULATORY_CARE_PROVIDER_SITE_OTHER): Payer: Medicare (Managed Care)

## 2022-06-24 VITALS — BP 128/70 | HR 68 | Temp 98.5°F | Ht 71.0 in | Wt 203.8 lb

## 2022-06-24 DIAGNOSIS — Z0000 Encounter for general adult medical examination without abnormal findings: Secondary | ICD-10-CM | POA: Diagnosis not present

## 2022-06-24 NOTE — Patient Instructions (Signed)
Mr. Jimmy Rice , Thank you for taking time to come for your Medicare Wellness Visit. I appreciate your ongoing commitment to your health goals. Please review the following plan we discussed and let me know if I can assist you in the future.   Screening recommendations/referrals: Colonoscopy: not required Recommended yearly ophthalmology/optometry visit for glaucoma screening and checkup Recommended yearly dental visit for hygiene and checkup  Vaccinations: Influenza vaccine: completed 05/29/2022 Pneumococcal vaccine: completed 07/15/2017 Tdap vaccine: completed 07/15/2017, due 07/16/2027 Shingles vaccine: needs second dose   Covid-19:  08/20/2020, 12/28/2019, 11/27/2019  Advanced directives: Please bring a copy of your POA (Power of Attorney) and/or Living Will to your next appointment.    Conditions/risks identified: none  Next appointment: Follow up in one year for your annual wellness visit.   Preventive Care 76 Years and Older, Male Preventive care refers to lifestyle choices and visits with your health care provider that can promote health and wellness. What does preventive care include? A yearly physical exam. This is also called an annual well check. Dental exams once or twice a year. Routine eye exams. Ask your health care provider how often you should have your eyes checked. Personal lifestyle choices, including: Daily care of your teeth and gums. Regular physical activity. Eating a healthy diet. Avoiding tobacco and drug use. Limiting alcohol use. Practicing safe sex. Taking low doses of aspirin every day. Taking vitamin and mineral supplements as recommended by your health care provider. What happens during an annual well check? The services and screenings done by your health care provider during your annual well check will depend on your age, overall health, lifestyle risk factors, and family history of disease. Counseling  Your health care provider may ask you questions  about your: Alcohol use. Tobacco use. Drug use. Emotional well-being. Home and relationship well-being. Sexual activity. Eating habits. History of falls. Memory and ability to understand (cognition). Work and work Statistician. Screening  You may have the following tests or measurements: Height, weight, and BMI. Blood pressure. Lipid and cholesterol levels. These may be checked every 5 years, or more frequently if you are over 49 years old. Skin check. Lung cancer screening. You may have this screening every year starting at age 43 if you have a 30-pack-year history of smoking and currently smoke or have quit within the past 15 years. Fecal occult blood test (FOBT) of the stool. You may have this test every year starting at age 55. Flexible sigmoidoscopy or colonoscopy. You may have a sigmoidoscopy every 5 years or a colonoscopy every 10 years starting at age 14. Prostate cancer screening. Recommendations will vary depending on your family history and other risks. Hepatitis C blood test. Hepatitis B blood test. Sexually transmitted disease (STD) testing. Diabetes screening. This is done by checking your blood sugar (glucose) after you have not eaten for a while (fasting). You may have this done every 1-3 years. Abdominal aortic aneurysm (AAA) screening. You may need this if you are a current or former smoker. Osteoporosis. You may be screened starting at age 80 if you are at high risk. Talk with your health care provider about your test results, treatment options, and if necessary, the need for more tests. Vaccines  Your health care provider may recommend certain vaccines, such as: Influenza vaccine. This is recommended every year. Tetanus, diphtheria, and acellular pertussis (Tdap, Td) vaccine. You may need a Td booster every 10 years. Zoster vaccine. You may need this after age 49. Pneumococcal 13-valent conjugate (PCV13) vaccine. One  dose is recommended after age 22. Pneumococcal  polysaccharide (PPSV23) vaccine. One dose is recommended after age 42. Talk to your health care provider about which screenings and vaccines you need and how often you need them. This information is not intended to replace advice given to you by your health care provider. Make sure you discuss any questions you have with your health care provider. Document Released: 10/19/2015 Document Revised: 06/11/2016 Document Reviewed: 07/24/2015 Elsevier Interactive Patient Education  2017 [employer] Prevention in the Home Falls can cause injuries. They can happen to people of all ages. There are many things you can do to make your home safe and to help prevent falls. What can I do on the outside of my home? Regularly fix the edges of walkways and driveways and fix any cracks. Remove anything that might make you trip as you walk through a door, such as a raised step or threshold. Trim any bushes or trees on the path to your home. Use bright outdoor lighting. Clear any walking paths of anything that might make someone trip, such as rocks or tools. Regularly check to see if handrails are loose or broken. Make sure that both sides of any steps have handrails. Any raised decks and porches should have guardrails on the edges. Have any leaves, snow, or ice cleared regularly. Use sand or salt on walking paths during winter. Clean up any spills in your garage right away. This includes oil or grease spills. What can I do in the bathroom? Use night lights. Install grab bars by the toilet and in the tub and shower. Do not use towel bars as grab bars. Use non-skid mats or decals in the tub or shower. If you need to sit down in the shower, use a plastic, non-slip stool. Keep the floor dry. Clean up any water that spills on the floor as soon as it happens. Remove soap buildup in the tub or shower regularly. Attach bath mats securely with double-sided non-slip rug tape. Do not have throw rugs and other  things on the floor that can make you trip. What can I do in the bedroom? Use night lights. Make sure that you have a light by your bed that is easy to reach. Do not use any sheets or blankets that are too big for your bed. They should not hang down onto the floor. Have a firm chair that has side arms. You can use this for support while you get dressed. Do not have throw rugs and other things on the floor that can make you trip. What can I do in the kitchen? Clean up any spills right away. Avoid walking on wet floors. Keep items that you use a lot in easy-to-reach places. If you need to reach something above you, use a strong step stool that has a grab bar. Keep electrical cords out of the way. Do not use floor polish or wax that makes floors slippery. If you must use wax, use non-skid floor wax. Do not have throw rugs and other things on the floor that can make you trip. What can I do with my stairs? Do not leave any items on the stairs. Make sure that there are handrails on both sides of the stairs and use them. Fix handrails that are broken or loose. Make sure that handrails are as long as the stairways. Check any carpeting to make sure that it is firmly attached to the stairs. Fix any carpet that is loose or worn.  Avoid having throw rugs at the top or bottom of the stairs. If you do have throw rugs, attach them to the floor with carpet tape. Make sure that you have a light switch at the top of the stairs and the bottom of the stairs. If you do not have them, ask someone to add them for you. What else can I do to help prevent falls? Wear shoes that: Do not have high heels. Have rubber bottoms. Are comfortable and fit you well. Are closed at the toe. Do not wear sandals. If you use a stepladder: Make sure that it is fully opened. Do not climb a closed stepladder. Make sure that both sides of the stepladder are locked into place. Ask someone to hold it for you, if possible. Clearly  mark and make sure that you can see: Any grab bars or handrails. First and last steps. Where the edge of each step is. Use tools that help you move around (mobility aids) if they are needed. These include: Canes. Walkers. Scooters. Crutches. Turn on the lights when you go into a dark area. Replace any light bulbs as soon as they burn out. Set up your furniture so you have a clear path. Avoid moving your furniture around. If any of your floors are uneven, fix them. If there are any pets around you, be aware of where they are. Review your medicines with your doctor. Some medicines can make you feel dizzy. This can increase your chance of falling. Ask your doctor what other things that you can do to help prevent falls. This information is not intended to replace advice given to you by your health care provider. Make sure you discuss any questions you have with your health care provider. Document Released: 07/19/2009 Document Revised: 02/28/2016 Document Reviewed: 10/27/2014 Elsevier Interactive Patient Education  2017 [employer].

## 2022-06-24 NOTE — Progress Notes (Signed)
Subjective:   Jimmy Rice is a 76 y.o. male who presents for Medicare Annual/Subsequent preventive examination.  Review of Systems     Cardiac Risk Factors include: advanced age (>9mn, >>20women);hypertension;male gender     Objective:    Today's Vitals   06/24/22 1003 06/24/22 1008  BP: 128/70   Pulse: 68   Temp: 98.5 F (36.9 C)   TempSrc: Oral   SpO2: 97%   Weight: 203 lb 12.8 oz (92.4 kg)   Height: '5\' 11"'$  (1.803 m)   PainSc:  9    Body mass index is 28.42 kg/m.     06/24/2022   10:13 AM 12/17/2021    6:04 AM 12/10/2021   11:24 AM 08/07/2021   12:00 PM 06/11/2021   10:28 AM 11/30/2020   11:31 AM 06/07/2020   10:35 AM  Advanced Directives  Does Patient Have a Medical Advance Directive? Yes Yes Yes No Yes No Yes  Type of AParamed[place]of [place] will [place] will [place]Living will  Living will  [place] will  Does patient want to make changes to medical advance directive?  No - Patient declined     No - Patient declined  Copy of H[place]in Chart? No - copy requested No - copy requested     No - copy requested    Current Medications (verified) Outpatient Encounter Medications as of 06/24/2022  Medication Sig   atorvastatin (LIPITOR) 20 MG tablet TAKE 1 TABLET BY MOUTH EVERY DAY   diltiazem (CARDIZEM CD) 120 MG 24 hr capsule Take 1 capsule (120 mg total) by mouth daily.   losartan-hydrochlorothiazide (HYZAAR) 100-12.5 MG tablet Take 1 tablet by mouth daily.   metoprolol succinate (TOPROL-XL) 50 MG 24 hr tablet TAKE 1 TABLET DAILY WITH OR IMMEDIATELY FOLLOWING A MEAL   No facility-administered encounter medications on file as of 06/24/2022.    Allergies (verified) Amlodipine, Flomax [tamsulosin hcl], and Tamsulosin   History: Past Medical History:  Diagnosis Date   Arthritis    "right knee" (07/22/2016)   BENIGN PROSTATIC HYPERTROPHY,  HX OF 04/04/2009   Blood transfusion    Cancer (HCC)    hx of porstate cancer   Childhood asthma    pt denies on proep of 12/10/21   Chronic kidney disease (CKD), stage III (moderate) (HCC)    cysts on kidneys, 3rd stage kidney disease   Complication of anesthesia    woke up "fighting"  confusion   GERD 01/18/2009   HYPERTENSION 01/18/2009   HYPOGONADISM 01/18/2009   Past Surgical History:  Procedure Laterality Date   COLONOSCOPY     INGUINAL HERNIA REPAIR Right [place]  PARTIAL KNEE ARTHROPLASTY Right 07/22/2016   Procedure: UNICOMPARTMENTAL KNEE ARTHROPLASTY;  Surgeon: TRenette Butters MD;  Location[facility]  Service: Orthopedics;  Laterality: Right;   PARTIAL KNEE ARTHROPLASTY Left 12/17/2021   Procedure: UNICOMPARTMENTAL KNEE;  Surgeon: MRenette Butters MD;  Location: [facility];  Service: Orthopedics;  Laterality: Left;   PROSTATE BIOPSY  X 5   REPLACEMENT UNICONDYLAR JOINT KNEE Right 07/22/2016   TRANSURETHRAL RESECTION OF PROSTATE  12/2014   Family History  Problem Relation Age of Onset   Breast cancer Mother    Kidney disease Father    Stomach cancer Paternal Aunt    Breast cancer Sister    Bladder Cancer Brother    Colon cancer Neg Hx  Esophageal cancer Neg Hx    Rectal cancer Neg Hx    Social History   Socioeconomic History   Marital status: Married    Spouse name: Not on file   Number of children: Not on file   Years of education: Not on file   Highest education level: Not on file  Occupational History   Not on file  Tobacco Use   Smoking status: Former    Types: Cigarettes   Smokeless tobacco: Never   Tobacco comments:    "quit smoking in the late 1960's; smoked < 1 pack cigarettes TOTAL"  Vaping Use   Vaping Use: Never used  Substance and Sexual Activity   Alcohol use: Not Currently    Alcohol/week: 1.0 standard drink of alcohol    Types: 1 Cans of beer per week    Comment: none   Drug use: No   Sexual activity: Yes   Other Topics Concern   Not on file  Social History Narrative   Retired from hazardous waste transportation   Right handed   One story home   Social Determinants of Health   Financial Resource Strain: Low Risk  (06/24/2022)   Overall Financial Resource Strain (CARDIA)    Difficulty of Paying Living Expenses: Not hard at all  Food Insecurity: No Food Insecurity (06/24/2022)   Hunger Vital Sign    Worried About Running Out of Food in the Last Year: Never true    Ran Out of Food in the Last Year: Never true  Transportation Needs: No Transportation Needs (06/24/2022)   PRAPARE - Hydrologist (Medical): No    Lack of Transportation (Non-Medical): No  Physical Activity: Sufficiently Active (06/24/2022)   Exercise Vital Sign    Days of Exercise per Week: 3 days    Minutes of Exercise per Session: 150+ min  Stress: No Stress Concern Present (06/24/2022)   [place]    Feeling of Stress : Not at all  Social Connections: [place] (06/11/2021)   Social Connection and Isolation Panel [NHANES]    Frequency of Communication with Friends and Family: Twice a week    Frequency of Social Gatherings with Friends and Family: More than three times a week    Attends Religious Services: 1 to 4 times per year    Active Member of [employer] or Organizations: Yes    Attends Archivist Meetings: 1 to 4 times per year    Marital Status: Married    Tobacco Counseling Counseling given: Not Answered Tobacco comments: "quit smoking in the late 1960's; smoked < 1 pack cigarettes TOTAL"   Clinical Intake:  Pre-visit preparation completed: Yes  Pain : 0-10 Pain Score: 9  Pain Type: Acute pain Pain Location: Back Pain Orientation: Right, Lower Pain Descriptors / Indicators: Aching Pain Onset: 1 to 4 weeks ago Pain Frequency: Constant     Nutritional Status: BMI 25 -29 Overweight Nutritional  Risks: None Diabetes: No  How often do you need to have someone help you when you read instructions, pamphlets, or other written materials from your doctor or pharmacy?: 1 - Never What is the last grade level you completed in school?: college  Diabetic? no  Interpreter Needed?: No  Information entered by :: NAllen LPN   Activities of Daily Living    06/24/2022   10:14 AM 12/10/2021   11:25 AM  In your present state of health, do you have any difficulty performing  the following activities:  Hearing? 0   Vision? 0   Difficulty concentrating or making decisions? 1   Walking or climbing stairs? 0   Dressing or bathing? 0   Doing errands, shopping? 0 0  Preparing Food and eating ? N   Using the Toilet? N   In the past six months, have you accidently leaked urine? Y   Comment due to prostate cancer   Do you have problems with loss of bowel control? N   Managing your Medications? N   Managing your Finances? N   Housekeeping or managing your Housekeeping? N     Patient Care Team: Eulas Post, MD as PCP - General Wellington Hampshire, MD as PCP - Cardiology (Cardiology) Alda Berthold, DO as Consulting Physician (Neurology)  Indicate any recent Medical Services you may have received from other than [facility] providers in the past year (date may be approximate).     Assessment:   This is a routine wellness examination for [place].  Hearing/Vision screen Vision Screening - Comments:: [facility]  Dietary issues and exercise activities discussed: Current Exercise Habits: Home exercise routine, Type of exercise: treadmill;strength training/weights;Other - see comments (swimming), Time (Minutes): > 60, Frequency (Times/Week): 3, Weekly Exercise (Minutes/Week): 0   Goals Addressed             This Visit's Progress    Patient Stated       06/24/2022, get kidney figured out       Depression Screen    06/24/2022   10:14 AM 12/11/2021    9:20 AM 06/11/2021   10:26  AM 06/07/2020   10:38 AM 11/09/2019    2:57 PM 05/19/2018    3:28 PM 05/19/2018    2:21 PM  PHQ 2/9 Scores  PHQ - 2 Score 0 0 0 0 0 0 0  PHQ- 9 Score    0 0      Fall Risk    06/24/2022   10:13 AM 12/11/2021    9:21 AM 06/11/2021   10:29 AM 06/07/2020   10:37 AM 09/15/2019   11:51 AM  [place] in the past year? 0 0 0 0 0  Number falls in past yr: 0 0 0 0 0  Injury with Fall? 0 0 0 0 0  Risk for fall due to : Medication side effect  Impaired vision Medication side effect   Follow up Falls prevention discussed;Education provided;Falls evaluation completed  Falls prevention discussed Falls evaluation completed;Falls prevention discussed     FALL RISK PREVENTION PERTAINING TO THE HOME:  Any stairs in or around the home? No  If so, are there any without handrails? N/a Home free of loose throw rugs in walkways, pet beds, electrical cords, etc? Yes  Adequate lighting in your home to reduce risk of falls? Yes   ASSISTIVE DEVICES UTILIZED TO PREVENT FALLS:  Life alert? No  Use of a cane, walker or w/c? No  Grab bars in the bathroom? No  Shower chair or bench in shower? Yes  Elevated toilet seat or a handicapped toilet? No   TIMED UP AND GO:  Was the test performed? Yes .  Length of time to ambulate 10 feet: 5 sec.   Gait steady and fast without use of assistive device  Cognitive Function:        06/24/2022   10:15 AM 06/11/2021   10:35 AM 06/07/2020   10:40 AM  6CIT Screen  What Year? 0  points 0 points 0 points  What month? 0 points 0 points 0 points  What time? 0 points 0 points 0 points  Count back from 20 0 points 0 points 0 points  Months in reverse 0 points 0 points 0 points  Repeat phrase 0 points 0 points 2 points  Total Score 0 points 0 points 2 points    Immunizations Immunization History  Administered Date(s) Administered   Fluad Quad(high Dose 65+) 06/07/2019   Influenza Split 06/17/2018   Influenza, High Dose Seasonal PF 06/18/2011, 07/10/2016,  06/17/2018, 06/26/2020   Influenza, Seasonal, Injecte, Preservative Fre 12/17/2015   Influenza,inj,Quad PF,6+ Mos 06/17/2013   Influenza-Unspecified 07/12/2001, 08/19/2001, 07/06/2005, 08/18/2006, 08/10/2007, 07/25/2008, 08/15/2009, 08/06/2010, 07/15/2012, 05/06/2013, 07/28/2014, 07/07/2015, 07/08/2016, 07/06/2017, 07/07/2019, 06/23/2020, 06/27/2021, 05/29/2022   Moderna Sars-Covid-2 Vaccination 11/27/2019, 12/28/2019, 08/20/2020   Pneumococcal Conjugate-13 08/14/2014, 05/08/2015, 07/15/2017   Pneumococcal Polysaccharide-23 02/10/2011, 07/07/2015   Pneumococcal-Unspecified 08/19/2001   Td 10/06/2005   Tdap 01/04/2010, 05/08/2015, 07/15/2017   Zoster Recombinat (Shingrix) 05/06/2022   Zoster, Live 05/12/2012    TDAP status: Up to date  Flu Vaccine status: Up to date  Pneumococcal vaccine status: Up to date  Covid-19 vaccine status: Completed vaccines  Qualifies for Shingles Vaccine? Yes   Zostavax completed Yes   Shingrix Completed?: needs second dose  Screening Tests Health Maintenance  Topic Date Due   COVID-19 Vaccine (4 - Moderna risk series) 10/15/2020   Zoster Vaccines- Shingrix (2 of 2) 07/01/2022   TETANUS/TDAP  07/16/2027   Pneumonia Vaccine 10+ Years old  Completed   INFLUENZA VACCINE  Completed   Hepatitis C Screening  Completed   HPV VACCINES  Aged Out   COLONOSCOPY (Pts 45-20yr Insurance coverage will need to be confirmed)  Discontinued    Health Maintenance  Health Maintenance Due  Topic Date Due   COVID-19 Vaccine (4 - Moderna risk series) 10/15/2020    Colorectal cancer screening: No longer required.   Lung Cancer Screening: (Low Dose CT Chest recommended if Age 76-80years, 30 pack-year currently smoking OR have quit w/in 15years.) does not qualify.   Lung Cancer Screening Referral: no  Additional Screening:  Hepatitis C Screening: does qualify; Completed 11/03/2016  Vision Screening: Recommended annual ophthalmology exams for early detection of  glaucoma and other disorders of the eye. Is the patient up to date with their annual eye exam?  Yes  Who is the provider or what is the name of the office in which the patient attends [facility] exams[facility] If pt is not established with a provider, would they like to be referred to a provider to establish care? No .   Dental Screening: Recommended annual dental exams for proper oral hygiene  Community Resource Referral / Chronic Care Management: CRR required this visit?  No   CCM required this visit?  No      Plan:     I have personally reviewed and noted the following in the patient's chart:   Medical and social history Use of alcohol, tobacco or illicit drugs  Current medications and supplements including opioid prescriptions. Patient is not currently taking opioid prescriptions. Functional ability and status Nutritional status Physical activity Advanced directives List of other physicians Hospitalizations, surgeries, and ER visits in previous 12 months Vitals Screenings to include cognitive, depression, and falls Referrals and appointments  In addition, I have reviewed and discussed with patient certain preventive protocols, quality metrics, and best practice recommendations. A written personalized care plan for preventive services as well as general  preventive health recommendations were provided to patient.     Kellie Simmering, LPN   04/16/5270   Nurse Notes: none

## 2022-07-01 ENCOUNTER — Other Ambulatory Visit: Payer: Self-pay | Admitting: Internal Medicine

## 2022-07-01 DIAGNOSIS — N2581 Secondary hyperparathyroidism of renal origin: Secondary | ICD-10-CM

## 2022-07-01 DIAGNOSIS — N1832 Chronic kidney disease, stage 3b: Secondary | ICD-10-CM

## 2022-07-01 DIAGNOSIS — I10 Essential (primary) hypertension: Secondary | ICD-10-CM

## 2022-07-01 DIAGNOSIS — N281 Cyst of kidney, acquired: Secondary | ICD-10-CM

## 2022-07-09 ENCOUNTER — Ambulatory Visit
Admission: RE | Admit: 2022-07-09 | Discharge: 2022-07-09 | Disposition: A | Discharge status: Home / Self Care | Payer: Medicare (Managed Care) | Source: Ambulatory Visit | Attending: Internal Medicine | Admitting: Internal Medicine

## 2022-07-09 DIAGNOSIS — N2581 Secondary hyperparathyroidism of renal origin: Secondary | ICD-10-CM

## 2022-07-09 DIAGNOSIS — N1832 Chronic kidney disease, stage 3b: Secondary | ICD-10-CM

## 2022-07-09 DIAGNOSIS — I10 Essential (primary) hypertension: Secondary | ICD-10-CM

## 2022-07-09 DIAGNOSIS — N281 Cyst of kidney, acquired: Secondary | ICD-10-CM

## 2022-08-01 ENCOUNTER — Encounter: Payer: Self-pay | Admitting: Internal Medicine

## 2022-08-08 ENCOUNTER — Encounter: Payer: Self-pay | Admitting: Internal Medicine

## 2022-08-18 ENCOUNTER — Telehealth: Payer: Self-pay | Admitting: *Deleted

## 2022-08-18 NOTE — Telephone Encounter (Signed)
Pt. Scheduled for colonoscopy  09/23/22 recall do he need OV Prior to procedure please advise?

## 2022-08-19 NOTE — Telephone Encounter (Signed)
I called the patient.  Though over 75 he is only 69 and still very vigorous and wishes to have 1 more screening colonoscopy so we will proceed.  He is not having any active GI symptoms.

## 2022-08-25 ENCOUNTER — Ambulatory Visit (AMBULATORY_SURGERY_CENTER): Payer: Self-pay | Admitting: *Deleted

## 2022-08-25 VITALS — Ht 71.0 in | Wt 200.6 lb

## 2022-08-25 DIAGNOSIS — Z1211 Encounter for screening for malignant neoplasm of colon: Secondary | ICD-10-CM

## 2022-08-25 MED ORDER — NA SULFATE-K SULFATE-MG SULF 17.5-3.13-1.6 GM/177ML PO SOLN: 1.0000 | Freq: Once | ORAL | 0 refills | Status: AC

## 2022-08-25 NOTE — Progress Notes (Signed)

## 2022-09-03 ENCOUNTER — Ambulatory Visit: Payer: Medicare (Managed Care) | Admitting: Urology

## 2022-09-15 ENCOUNTER — Ambulatory Visit (INDEPENDENT_AMBULATORY_CARE_PROVIDER_SITE_OTHER): Payer: Medicare (Managed Care) | Admitting: Family Medicine

## 2022-09-15 ENCOUNTER — Encounter: Payer: Self-pay | Admitting: Family Medicine

## 2022-09-15 VITALS — BP 136/70 | HR 69 | Temp 98.0°F | Ht 71.0 in | Wt 205.1 lb

## 2022-09-15 DIAGNOSIS — I10 Essential (primary) hypertension: Secondary | ICD-10-CM

## 2022-09-15 DIAGNOSIS — N281 Cyst of kidney, acquired: Secondary | ICD-10-CM | POA: Diagnosis not present

## 2022-09-15 DIAGNOSIS — N1832 Chronic kidney disease, stage 3b: Secondary | ICD-10-CM

## 2022-09-15 NOTE — Progress Notes (Signed)
Established Patient Office Visit  Subjective   Patient ID: Jimmy Rice, male    DOB: September 24, 1946  Age: 76 y.o. MRN: [id number]  Chief Complaint  Patient presents with   Follow-up   Labs Only    HPI   Mr. Schellhase came in today basically trying to get some clarification.  He has chronic problems including chronic kidney disease had recently seen his nephrologist.  He was referred to a urologist and he had the impression that he had "hyperthyroidism ".  However, and going through notes it looks like he has secondary hyperparathyroidism related to his chronic kidney disease.  His chronic problems include history of hypertension, GERD, osteoarthritis, BPH, chronic kidney disease, adenocarcinoma of the prostate, bilateral renal cyst.  His current medications include atorvastatin, diltiazem, losartan HCTZ, and metoprolol.  He actually sees a urologist up in [place] but was recently referred by his nephrologist to urologist in [place].  Patient was confused about recent lab results because of his normal TSH here back in May.  Generally feels well.  No obstructive urinary symptoms currently.  His blood pressure has been very stable.  Lipids were well-controlled when checked back in May  Past Medical History:  Diagnosis Date   Allergy    SEASONAL   Arthritis    "right knee" (07/22/2016)   BENIGN PROSTATIC HYPERTROPHY, HX OF 04/04/2009   Blood transfusion    Cancer (HCC)    hx of porstate cancer   Cataract    BILATERAL   Childhood asthma    pt denies on proep of 12/10/21   Chronic kidney disease (CKD), stage III (moderate) (HCC)    cysts on kidneys, 3rd stage kidney disease   Complication of anesthesia    woke up "fighting"  confusion   GERD 01/18/2009   HYPERTENSION 01/18/2009   HYPOGONADISM 01/18/2009   Past Surgical History:  Procedure Laterality Date   COLONOSCOPY     2013   INGUINAL HERNIA REPAIR Right [place]   PARTIAL KNEE ARTHROPLASTY  Right 07/22/2016   Procedure: UNICOMPARTMENTAL KNEE ARTHROPLASTY;  Surgeon: Renette Butters, MD;  Location: [place];  Service: Orthopedics;  Laterality: Right;   PARTIAL KNEE ARTHROPLASTY Left 12/17/2021   Procedure: UNICOMPARTMENTAL KNEE;  Surgeon: Renette Butters, MD;  Location: [facility];  Service: Orthopedics;  Laterality: Left;   PROSTATE BIOPSY  X 5   REPLACEMENT UNICONDYLAR JOINT KNEE Right 07/22/2016   TRANSURETHRAL RESECTION OF PROSTATE  12/2014    reports that he has quit smoking. His smoking use included cigarettes. He has never been exposed to tobacco smoke. He has never used smokeless tobacco. He reports that he does not currently use alcohol after a past usage of about 1.0 standard drink of alcohol per week. He reports that he does not use drugs. family history includes Bladder Cancer in his brother; Breast cancer in his mother and sister; Kidney disease in his father; Stomach cancer in his paternal aunt. Allergies  Allergen Reactions   Amlodipine Hives   Flomax [Tamsulosin Hcl] Other (See Comments)    DIFFICULTY BREATHING NASAL STUFFINESS   Tamsulosin Other (See Comments)    DIFFICULTY BREATHING NASAL STUFFINESS     Review of Systems  Constitutional:  Negative for malaise/fatigue.  Eyes:  Negative for blurred vision.  Respiratory:  Negative for shortness of breath.   Cardiovascular:  Negative for chest pain.  Gastrointestinal:  Negative for abdominal pain.  Neurological:  Negative for dizziness, weakness and headaches.  Objective:     BP 136/70 (BP Location: Left Arm, Patient Position: Sitting, Cuff Size: Large)   Pulse 69   Temp 98 F (36.7 C) (Oral)   Ht '5\' 11"'$  (1.803 m)   Wt 205 lb 1.6 oz (93 kg)   SpO2 98%   BMI 28.61 kg/m  BP Readings from Last 3 Encounters:  09/15/22 136/70  06/24/22 128/70  02/07/22 126/66   Wt Readings from Last 3 Encounters:  09/15/22 205 lb 1.6 oz (93 kg)  08/25/22 200 lb 9.6 oz (91 kg)  06/24/22 203 lb 12.8 oz (92.4 kg)       Physical Exam Vitals reviewed.  Constitutional:      General: He is not in acute distress.    Appearance: He is not ill-appearing.  Cardiovascular:     Rate and Rhythm: Normal rate and regular rhythm.  Pulmonary:     Effort: Pulmonary effort is normal.     Breath sounds: Normal breath sounds.  Neurological:     Mental Status: He is alert.      No results found for any visits on 09/15/22.    The 10-year ASCVD risk score (Arnett DK, et al., 2019) is: 23.9%    Assessment & Plan:   #1 secondary hyperparathyroidism.  This was diagnosed by his nephrologist.  We explained the difference between this (which is frequently related to chronic kidney disease) and "hyperthyroidism ".  He has no overt symptoms of hyperthyroidism and recent TSH was normal.  We explained that hyperphosphatemia seen with chronic kidney disease can be a trigger for increased PTH Might be useful to check vitamin D levels with next blood draw.  #2 history of atypical cyst right kidney.  He had recent CT abdomen October 4 of this year which was reviewed.  This showed bilateral simple appearing renal cyst with no further evaluation recommended.  Dominant cyst noted on prior exams upper and lower pole right kidney had resolved.  He does have follow-up with his urologist in [place] soon next month  #3 hypertension currently controlled with diltiazem, losartan HCTZ, and metoprolol.  Continue current regimen.  Carolann Littler, MD

## 2022-09-17 ENCOUNTER — Encounter: Payer: Self-pay | Admitting: Internal Medicine

## 2022-09-22 ENCOUNTER — Encounter: Payer: Self-pay | Admitting: Certified Registered Nurse Anesthetist

## 2022-09-23 ENCOUNTER — Encounter: Payer: Self-pay | Admitting: Internal Medicine

## 2022-09-23 ENCOUNTER — Ambulatory Visit (AMBULATORY_SURGERY_CENTER): Payer: Medicare (Managed Care) | Admitting: Internal Medicine

## 2022-09-23 VITALS — BP 136/78 | HR 80 | Temp 96.8°F | Resp 27 | Ht 71.0 in | Wt 200.6 lb

## 2022-09-23 DIAGNOSIS — Z1211 Encounter for screening for malignant neoplasm of colon: Secondary | ICD-10-CM

## 2022-09-23 MED ORDER — SODIUM CHLORIDE 0.9 % IV SOLN: 500.0000 mL | Freq: Once | INTRAVENOUS | Status: DC

## 2022-09-23 NOTE — Op Note (Signed)
[facility] Patient Name: Jimmy Rice Procedure Date: 09/23/2022 1:24 PM MRN: [id number] Endoscopist: Gatha Mayer , MD, [id number] Age: 76 Referring MD:  Date of Birth: [birth year] Gender: Male Account #: [id number] Procedure:                Colonoscopy Indications:              Screening for colorectal malignant neoplasm, Last                            colonoscopy: 2013 Medicines:                Monitored Anesthesia Care Procedure:                Pre-Anesthesia Assessment:                           - Prior to the procedure, a History and Physical                            was performed, and patient medications and                            allergies were reviewed. The patient's tolerance of                            previous anesthesia was also reviewed. The risks                            and benefits of the procedure and the sedation                            options and risks were discussed with the patient.                            All questions were answered, and informed consent                            was obtained. Prior Anticoagulants: The patient has                            taken no anticoagulant or antiplatelet agents. ASA                            Grade Assessment: II - A patient with mild systemic                            disease. After reviewing the risks and benefits,                            the patient was deemed in satisfactory condition to                            undergo the procedure.  After obtaining informed consent, the colonoscope                            was passed under direct vision. Throughout the                            procedure, the patient's blood pressure, pulse, and                            oxygen saturations were monitored continuously. The                            Olympus CF-HQ190L [id number]) Colonoscope was                            introduced through the anus and advanced to  the the                            cecum, identified by appendiceal orifice and                            ileocecal valve. The colonoscopy was performed                            without difficulty. The patient tolerated the                            procedure well. The quality of the bowel                            preparation was good. The ileocecal valve,                            appendiceal orifice, and rectum were photographed.                            The bowel preparation used was SUPREP via split                            dose instruction. Scope In: 1:41:18 PM Scope Out: 1:51:38 PM Scope Withdrawal Time: 0 hours 7 minutes 32 seconds  Total Procedure Duration: 0 hours 10 minutes 20 seconds  Findings:                 The perianal and digital rectal examinations were                            normal.                           Many small and large diverticula were found in the                            entire colon.  The exam was otherwise without abnormality on                            direct and retroflexion views. Complications:            No immediate complications. Estimated Blood Loss:     Estimated blood loss: none. Impression:               - Severe diverticulosis in the entire examined                            colon.                           - The examination was otherwise normal on direct                            and retroflexion views.                           - No specimens collected. Recommendation:           - Patient has a contact number available for                            emergencies. The signs and symptoms of potential                            delayed complications were discussed with the                            patient. Return to normal activities tomorrow.                            Written discharge instructions were provided to the                            patient.                           - Resume  previous diet.                           - Continue present medications.                           - No repeat colonoscopy due to age and the absence                            of colonic polyps. Gatha Mayer, MD 09/23/2022 1:59:38 PM This report has been signed electronically.

## 2022-09-23 NOTE — Progress Notes (Signed)
[facility] History and Physical   Primary Care Physician:  Jimmy Post, MD   Reason for Procedure:   CRCA screening  Plan:    colonoscopy     HPI: Jimmy Rice is a 76 y.o. male for screening exam   Past Medical History:  Diagnosis Date   Allergy    SEASONAL   Arthritis    "right knee" (07/22/2016)   BENIGN PROSTATIC HYPERTROPHY, HX OF 04/04/2009   Blood transfusion    Cancer ([facility])    hx of porstate cancer   Cataract    BILATERAL   Childhood asthma    pt denies on proep of 12/10/21   Chronic kidney disease (CKD), stage III (moderate) (HCC)    cysts on kidneys, 3rd stage kidney disease   Complication of anesthesia    woke up "fighting"  confusion   GERD 01/18/2009   HYPERTENSION 01/18/2009   HYPOGONADISM 01/18/2009    Past Surgical History:  Procedure Laterality Date   COLONOSCOPY     2013   [place]   PARTIAL KNEE ARTHROPLASTY Right 07/22/2016   Procedure: UNICOMPARTMENTAL KNEE ARTHROPLASTY;  Surgeon: Renette Butters, MD;  Location: [facility];  Service: Orthopedics;  Laterality: Right;   PARTIAL KNEE ARTHROPLASTY Left 12/17/2021   Procedure: UNICOMPARTMENTAL KNEE;  Surgeon: Renette Butters, MD;  Location: [facility];  Service: Orthopedics;  Laterality: Left;   PROSTATE BIOPSY  X 5   REPLACEMENT UNICONDYLAR JOINT KNEE Right 07/22/2016   TRANSURETHRAL RESECTION OF PROSTATE  12/2014    Prior to Admission medications   Medication Sig Start Date End Date Taking? Authorizing Provider  atorvastatin (LIPITOR) 20 MG tablet TAKE 1 TABLET BY MOUTH EVERY DAY 05/30/22  Yes Burchette, Alinda Sierras, MD  diltiazem (CARDIZEM CD) 120 MG 24 hr capsule Take 1 capsule (120 mg total) by mouth daily. 12/15/21  Yes Burchette, Alinda Sierras, MD  losartan-hydrochlorothiazide (HYZAAR) 100-12.5 MG tablet Take 1 tablet by mouth daily. 05/21/22  Yes Burchette, Alinda Sierras, MD  metoprolol succinate (TOPROL-XL) 50 MG 24 hr tablet TAKE 1  TABLET DAILY WITH OR IMMEDIATELY FOLLOWING A MEAL 05/21/22  Yes Burchette, Alinda Sierras, MD    Current Outpatient Medications  Medication Sig Dispense Refill   atorvastatin (LIPITOR) 20 MG tablet TAKE 1 TABLET BY MOUTH EVERY DAY 90 tablet 1   diltiazem (CARDIZEM CD) 120 MG 24 hr capsule Take 1 capsule (120 mg total) by mouth daily. 90 capsule 3   losartan-hydrochlorothiazide (HYZAAR) 100-12.5 MG tablet Take 1 tablet by mouth daily. 30 tablet 0   metoprolol succinate (TOPROL-XL) 50 MG 24 hr tablet TAKE 1 TABLET DAILY WITH OR IMMEDIATELY FOLLOWING A MEAL 30 tablet 0   Current Facility-Administered Medications  Medication Dose Route Frequency Provider Last Rate Last Admin   0.9 %  sodium chloride infusion  500 mL Intravenous Once Gatha Mayer, MD        Allergies as of 09/23/2022 - Review Complete 09/23/2022  Allergen Reaction Noted   Amlodipine Hives 03/30/2014   Flomax [tamsulosin hcl] Other (See Comments) 07/26/2012   Tamsulosin Other (See Comments) 07/26/2012    Family History  Problem Relation Age of Onset   Breast cancer Mother    Kidney disease Father    Breast cancer Sister    Bladder Cancer Brother    Stomach cancer Paternal Aunt    Colon cancer Neg Hx    Esophageal cancer Neg Hx    Rectal  cancer Neg Hx    Colon polyps Neg Hx    Crohn's disease Neg Hx    Ulcerative colitis Neg Hx     Social History   Socioeconomic History   Marital status: Married    Spouse name: Not on file   Number of children: Not on file   Years of education: Not on file   Highest education level: Not on file  Occupational History   Not on file  Tobacco Use   Smoking status: Former    Types: Cigarettes    Passive exposure: Never   Smokeless tobacco: Never   Tobacco comments:    "quit smoking in the late 1960's; smoked < 1 pack cigarettes TOTAL"  Vaping Use   Vaping Use: Never used  Substance and Sexual Activity   Alcohol use: Not Currently    Alcohol/week: 1.0 standard drink of alcohol     Types: 1 Cans of beer per week    Comment: none   Drug use: No   Sexual activity: Yes  Other Topics Concern   Not on file  Social History Narrative   Retired from hazardous waste transportation   Right handed   One story home   Social Determinants of Health   Financial Resource Strain: Low Risk  (06/24/2022)   Overall Financial Resource Strain (CARDIA)    Difficulty of Paying Living Expenses: Not hard at all  Food Insecurity: No Food Insecurity (06/24/2022)   Hunger Vital Sign    Worried About Running Out of Food in the Last Year: Never true    Ran Out of Food in the Last Year: Never true  Transportation Needs: No Transportation Needs (06/24/2022)   PRAPARE - Hydrologist (Medical): No    Lack of Transportation (Non-Medical): No  Physical Activity: Sufficiently Active (06/24/2022)   Exercise Vital Sign    Days of Exercise per Week: 3 days    Minutes of Exercise per Session: 150+ min  Stress: No Stress Concern Present (06/24/2022)   [place]    Feeling of Stress : Not at all  Social Connections: [place] (06/11/2021)   Social Connection and Isolation Panel [NHANES]    Frequency of Communication with Friends and Family: Twice a week    Frequency of Social Gatherings with Friends and Family: More than three times a week    Attends Religious Services: 1 to 4 times per year    Active Member of [employer] or Organizations: Yes    Attends Archivist Meetings: 1 to 4 times per year    Marital Status: Married  Human resources officer Violence: Not At Risk (06/11/2021)   Humiliation, Afraid, Rape, and Kick questionnaire    Fear of Current or Ex-Partner: No    Emotionally Abused: No    Physically Abused: No    Sexually Abused: No    Review of Systems:  All other review of systems negative except as mentioned in the HPI.  Physical Exam: Vital signs BP (!) 188/84   Pulse 72    Temp (!) 96.8 F (36 C) (Temporal)   Resp 11   Ht '5\' 11"'$  (1.803 m)   Wt 200 lb 9.6 oz (91 kg)   SpO2 98%   BMI 27.98 kg/m   General:   Alert,  Well-developed, well-nourished, pleasant and cooperative in NAD Lungs:  Clear throughout to auscultation.   Heart:  Regular rate and rhythm; no murmurs, clicks, rubs,  or  gallops. Abdomen:  Soft, nontender and nondistended. Normal bowel sounds.   Neuro/Psych:  Alert and cooperative. Normal mood and affect. A and O x 3   '@Glynis Hunsucker'$  Simonne Maffucci, MD, [facility] [phone] (pager) 09/23/2022 1:36 PM@

## 2022-09-23 NOTE — Progress Notes (Signed)
Pt's states no medical or surgical changes since previsit or office visit. 

## 2022-09-23 NOTE — Patient Instructions (Addendum)
No polyps or cancer seen.  You do have diverticulosis - thickened muscle rings and pouches in the colon wall. Please read the handout about this condition.  You do not need further routine colonoscopy testing.  I appreciate the opportunity to care for you. Gatha Mayer, MD, [facility]  There were no polyps seen today!  You will NOT need another screening colonoscopy, however,  call us at [phone] if you have a change in bowel habits, change in family history of colo-rectal cancer, rectal bleeding or other GI concern in the future!  YOU HAD AN ENDOSCOPIC PROCEDURE TODAY AT [facility]:   Refer to the procedure report that was given to you for any specific questions about what was found during the examination.  If the procedure report does not answer your questions, please call your gastroenterologist to clarify.  If you requested that your care partner not be given the details of your procedure findings, then the procedure report has been included in a sealed envelope for you to review at your convenience later.  YOU SHOULD EXPECT: Some feelings of bloating in the abdomen. Passage of more gas than usual.  Walking can help get rid of the air that was put into your GI tract during the procedure and reduce the bloating. If you had a lower endoscopy (such as a colonoscopy or flexible sigmoidoscopy) you may notice spotting of blood in your stool or on the toilet paper. If you underwent a bowel prep for your procedure, you may not have a normal bowel movement for a few days.  Please Note:  You might notice some irritation and congestion in your nose or some drainage.  This is from the oxygen used during your procedure.  There is no need for concern and it should clear up in a day or so.  SYMPTOMS TO REPORT IMMEDIATELY:  Following lower endoscopy (colonoscopy):  Excessive amounts of blood in the stool  Significant tenderness or worsening of abdominal pains  Swelling of the abdomen  that is new, acute  Fever of 100F or higher  For urgent or emergent issues, a gastroenterologist can be reached at any hour by calling [phone]. Do not use MyChart messaging for urgent concerns.    DIET:  We do recommend a small meal at first, but then you may proceed to your regular diet.  Drink plenty of fluids but you should avoid alcoholic beverages for 24 hours.  ACTIVITY:  You should plan to take it easy for the rest of today and you should NOT DRIVE or use heavy machinery until tomorrow (because of the sedation medicines used during the test).    FOLLOW UP: Our staff will call the number listed on your records the next business day following your procedure.  We will call around 7:15- 8:00 am to check on you and address any questions or concerns that you may have regarding the information given to you following your procedure. If we do not reach you, we will leave a message.      SIGNATURES/CONFIDENTIALITY: You and/or your care partner have signed paperwork which will be entered into your electronic medical record.  These signatures attest to the fact that that the information above on your After Visit Summary has been reviewed and is understood.  Full responsibility of the confidentiality of this discharge information lies with you and/or your care-partner.

## 2022-09-23 NOTE — Progress Notes (Signed)
Report given to PACU, vss 

## 2022-10-13 ENCOUNTER — Other Ambulatory Visit: Payer: Self-pay

## 2022-10-13 MED ORDER — LOSARTAN POTASSIUM-HCTZ 100-12.5 MG PO TABS: 1.0000 | ORAL_TABLET | Freq: Every day | ORAL | 3 refills | Status: DC

## 2022-10-13 MED ORDER — DILTIAZEM HCL ER COATED BEADS 120 MG PO CP24: 120.0000 mg | ORAL_CAPSULE | Freq: Every day | ORAL | 3 refills | Status: DC

## 2022-10-13 MED ORDER — METOPROLOL SUCCINATE ER 50 MG PO TB24: ORAL_TABLET | ORAL | 3 refills | Status: DC

## 2022-10-13 MED ORDER — ATORVASTATIN CALCIUM 20 MG PO TABS: 20.0000 mg | ORAL_TABLET | Freq: Every day | ORAL | 3 refills | Status: DC

## 2022-10-13 NOTE — Addendum Note (Signed)
Addended by: Nilda Riggs on: 10/13/2022 03:16 PM   Modules accepted: Orders

## 2022-10-16 ENCOUNTER — Telehealth: Payer: Self-pay

## 2022-10-16 NOTE — Telephone Encounter (Signed)
--  Caller states that he had covid recently and took the antivirals and now he is constantly lethargic and weak from the medication. Last dose of paxlovid was friday.  10/14/2022 11:31:41 AM See HCP within 4 Hours (or PCP triage) Raenette Rover, RN, [facility]  Comments User: Wilson Singer, RN Date/Time Eilene Ghazi Time): 10/14/2022 11:26:05 AM Still having cough and congestion post covid medication. A lot of flem in the throat.  User: Wilson Singer, RN Date/Time Eilene Ghazi Time): 10/14/2022 11:34:51 AM No further openings today per back line. Caller plans to go to UC.  Referrals [facility] [place]  10/16/22 1105 - Pt continues to report generalized weakness. Appt scheduled with PCP for 10/20/22

## 2022-10-20 ENCOUNTER — Encounter: Payer: Self-pay | Admitting: Family Medicine

## 2022-10-20 ENCOUNTER — Ambulatory Visit (INDEPENDENT_AMBULATORY_CARE_PROVIDER_SITE_OTHER): Payer: Medicare Other | Admitting: Family Medicine

## 2022-10-20 VITALS — BP 120/60 | HR 67 | Temp 98.3°F | Ht 71.0 in | Wt 205.0 lb

## 2022-10-20 DIAGNOSIS — R5383 Other fatigue: Secondary | ICD-10-CM | POA: Diagnosis not present

## 2022-10-20 DIAGNOSIS — U071 COVID-19: Secondary | ICD-10-CM | POA: Diagnosis not present

## 2022-10-20 DIAGNOSIS — R0600 Dyspnea, unspecified: Secondary | ICD-10-CM | POA: Diagnosis not present

## 2022-10-20 NOTE — Progress Notes (Signed)
Established Patient Office Visit  Subjective   Patient ID: Jimmy Rice, male    DOB: 13-May-1946  Age: 77 y.o. MRN: [id number]  Chief Complaint  Patient presents with   Weakness    Patient complains of weakness, x4 weeks    Cough    Patient complains of cough, Non productive, x4 weeks    HPI   Jimmy Rice presents with nonspecific symptoms of some lingering dry cough and fatigue and weakness following COVID infection which was diagnosed apparently on 09-25-2022.  He went to urgent care elsewhere and was treated with Paxlovid.  He did feel some improved afterwards but has had some pervasive fatigue since then.  Appetite is good.  He had some mild diarrhea with his infection but none at this point no recurrent fever.  He has hypertension which is treated with diltiazem, losartan HCTZ, and metoprolol.  He takes atorvastatin for hyperlipidemia.  No significant myalgias.  He had history of adenocarcinoma the prostate and severe BPH.  Is considering repeat TURP up in [place] possibly later this year.  Still has frequent nocturia secondary to his prostate.  Has been less active since COVID infection a few weeks ago but hopes to build up his walking soon.  He had noted even prior to getting COVID having some dyspnea more than usual with day-to-day activities.  No associated chest pains.  Sometimes lightheadedness and his wife had noted that he seemed more winded than usual.  Last CBC was in May and no anemia noted at that time.  He never smoked. Does relate getting a follow-up chest x-ray at urgent care about 2 weeks after a COVID infection and this showed no pneumonia or other acute abnormalities.  Past Medical History:  Diagnosis Date   Allergy    SEASONAL   Arthritis    "right knee" (07/22/2016)   BENIGN PROSTATIC HYPERTROPHY, HX OF 04/04/2009   Blood transfusion    Cancer (HCC)    hx of porstate cancer   Cataract    BILATERAL   Childhood asthma    pt denies on proep of 12/10/21    Chronic kidney disease (CKD), stage III (moderate) (HCC)    cysts on kidneys, 3rd stage kidney disease   Complication of anesthesia    woke up "fighting"  confusion   GERD 01/18/2009   HYPERTENSION 01/18/2009   HYPOGONADISM 01/18/2009   Past Surgical History:  Procedure Laterality Date   COLONOSCOPY     2013   INGUINAL HERNIA REPAIR Right [place]   PARTIAL KNEE ARTHROPLASTY Right 07/22/2016   Procedure: UNICOMPARTMENTAL KNEE ARTHROPLASTY;  Surgeon: Renette Butters, MD;  Location: [facility];  Service: Orthopedics;  Laterality: Right;   PARTIAL KNEE ARTHROPLASTY Left 12/17/2021   Procedure: UNICOMPARTMENTAL KNEE;  Surgeon: Renette Butters, MD;  Location: [facility];  Service: Orthopedics;  Laterality: Left;   PROSTATE BIOPSY  X 5   REPLACEMENT UNICONDYLAR JOINT KNEE Right 07/22/2016   TRANSURETHRAL RESECTION OF PROSTATE  12/2014    reports that he has quit smoking. His smoking use included cigarettes. He has never been exposed to tobacco smoke. He has never used smokeless tobacco. He reports that he does not currently use alcohol after a past usage of about 1.0 standard drink of alcohol per week. He reports that he does not use drugs. family history includes Bladder Cancer in his brother; Breast cancer in his mother and sister; Kidney disease in his father; Stomach cancer in  his paternal aunt. Allergies  Allergen Reactions   Amlodipine Hives   Flomax [Tamsulosin Hcl] Other (See Comments)    DIFFICULTY BREATHING NASAL STUFFINESS   Tamsulosin Other (See Comments)    DIFFICULTY BREATHING NASAL STUFFINESS     Review of Systems  Constitutional:  Negative for chills and fever.  Respiratory:  Positive for cough and shortness of breath. Negative for wheezing.   Cardiovascular:  Negative for chest pain and leg swelling.  Neurological:  Negative for dizziness and focal weakness.      Objective:     BP 120/60 (BP Location: Left Arm, Patient Position: Sitting,  Cuff Size: Large)   Pulse 67   Temp 98.3 F (36.8 C) (Oral)   Ht '5\' 11"'$  (1.803 m)   Wt 205 lb (93 kg)   SpO2 100%   BMI 28.59 kg/m  BP Readings from Last 3 Encounters:  10/20/22 120/60  09/23/22 136/78  09/15/22 136/70   Wt Readings from Last 3 Encounters:  10/20/22 205 lb (93 kg)  09/23/22 200 lb 9.6 oz (91 kg)  09/15/22 205 lb 1.6 oz (93 kg)      Physical Exam Vitals reviewed.  Constitutional:      General: He is not in acute distress.    Appearance: Normal appearance.  Cardiovascular:     Rate and Rhythm: Normal rate and regular rhythm.  Pulmonary:     Effort: Pulmonary effort is normal.     Breath sounds: Normal breath sounds. No wheezing or rales.  Musculoskeletal:     Right lower leg: No edema.     Left lower leg: No edema.  Neurological:     Mental Status: He is alert.      No results found for any visits on 10/20/22.  Last CBC Lab Results  Component Value Date   WBC 4.0 02/07/2022   HGB 13.0 02/07/2022   HCT 40.2 02/07/2022   MCV 93.0 02/07/2022   MCH 30.6 12/17/2021   RDW 13.4 02/07/2022   PLT 186.0 51/11/5850   Last metabolic panel Lab Results  Component Value Date   GLUCOSE 137 (H) 02/07/2022   NA 140 02/07/2022   K 4.0 02/07/2022   CL 101 02/07/2022   CO2 30 02/07/2022   BUN 22 02/07/2022   CREATININE 1.74 (H) 02/07/2022   GFRNONAA 42 (L) 12/10/2021   CALCIUM 9.6 02/07/2022   PROT 7.3 02/07/2022   ALBUMIN 4.3 02/07/2022   BILITOT 1.2 02/07/2022   ALKPHOS 63 02/07/2022   AST 14 02/07/2022   ALT 15 02/07/2022   ANIONGAP 4 (L) 12/10/2021   Last lipids Lab Results  Component Value Date   CHOL 145 02/07/2022   HDL 44.20 02/07/2022   LDLCALC 76 02/07/2022   LDLDIRECT 106.0 05/19/2018   TRIG 126.0 02/07/2022   CHOLHDL 3 02/07/2022   Last thyroid functions Lab Results  Component Value Date   TSH 4.29 02/07/2022      The 10-year ASCVD risk score (Arnett DK, et al., 2019) is: 19.9%    Assessment & Plan:   #1 post COVID  fatigue.  Nonspecific symptoms of dry cough and fatigue.  Has not had any persistent fever.  O2 sat room air 100%.  No evidence for volume overload on exam.  This all may be post-COVID symptoms.  We did discuss some possible labs but our lab was already closed by time he was seen. -Try to slowly progress with low level activity such as walking.  If fatigue not improving over the next  week or 2 consider follow-up for some labs including CBC and c-Met  #2 dyspnea with normal ADLs.  He states that this was going on even prior to her COVID infection.  Recent chest x-ray reportedly unremarkable.  He has never had any associated exertional chest pain.  No history of smoking or chronic lung disease.  Consider echocardiogram as a starting point No evidence for volume overload on exam today.  Carolann Littler, MD

## 2022-10-23 ENCOUNTER — Telehealth: Payer: Self-pay | Admitting: Family Medicine

## 2022-10-23 NOTE — Telephone Encounter (Signed)
Patient calling to check and see if he should have the echocardiogram discussed at the previous appointment

## 2022-10-27 NOTE — Telephone Encounter (Signed)
Patient informed of the message below and verbalized understanding.  

## 2022-10-28 ENCOUNTER — Encounter: Payer: Self-pay | Admitting: Cardiovascular Disease

## 2022-10-28 NOTE — Telephone Encounter (Signed)
error 

## 2022-11-03 ENCOUNTER — Telehealth: Payer: Self-pay | Admitting: Family Medicine

## 2022-11-03 NOTE — Telephone Encounter (Signed)
FYI pt is calling to let md know he had stress test and his heart is ok

## 2022-11-04 NOTE — Telephone Encounter (Signed)
Pt called, stating he thinks CMA called him. CMA was unavailable. Pt asked that CMA call back at his earliest convenience.

## 2022-11-04 NOTE — Telephone Encounter (Signed)
Patient informed of the message below.

## 2022-11-06 DIAGNOSIS — N3281 Overactive bladder: Secondary | ICD-10-CM | POA: Diagnosis not present

## 2022-11-06 DIAGNOSIS — N138 Other obstructive and reflux uropathy: Secondary | ICD-10-CM | POA: Diagnosis not present

## 2022-11-12 DIAGNOSIS — R339 Retention of urine, unspecified: Secondary | ICD-10-CM | POA: Diagnosis not present

## 2022-11-12 DIAGNOSIS — N138 Other obstructive and reflux uropathy: Secondary | ICD-10-CM | POA: Diagnosis not present

## 2022-11-17 ENCOUNTER — Other Ambulatory Visit: Payer: Self-pay | Admitting: Family Medicine

## 2022-11-18 ENCOUNTER — Other Ambulatory Visit (HOSPITAL_COMMUNITY): Payer: Medicare Other

## 2022-12-01 DIAGNOSIS — H04123 Dry eye syndrome of bilateral lacrimal glands: Secondary | ICD-10-CM | POA: Diagnosis not present

## 2022-12-01 DIAGNOSIS — H0102B Squamous blepharitis left eye, upper and lower eyelids: Secondary | ICD-10-CM | POA: Diagnosis not present

## 2022-12-01 DIAGNOSIS — H0102A Squamous blepharitis right eye, upper and lower eyelids: Secondary | ICD-10-CM | POA: Diagnosis not present

## 2022-12-01 DIAGNOSIS — H40013 Open angle with borderline findings, low risk, bilateral: Secondary | ICD-10-CM | POA: Diagnosis not present

## 2022-12-01 DIAGNOSIS — H2513 Age-related nuclear cataract, bilateral: Secondary | ICD-10-CM | POA: Diagnosis not present

## 2022-12-19 ENCOUNTER — Other Ambulatory Visit: Payer: Self-pay | Admitting: Family Medicine

## 2022-12-22 DIAGNOSIS — H04123 Dry eye syndrome of bilateral lacrimal glands: Secondary | ICD-10-CM | POA: Diagnosis not present

## 2022-12-22 DIAGNOSIS — H2512 Age-related nuclear cataract, left eye: Secondary | ICD-10-CM | POA: Diagnosis not present

## 2022-12-22 DIAGNOSIS — H2513 Age-related nuclear cataract, bilateral: Secondary | ICD-10-CM | POA: Diagnosis not present

## 2022-12-22 DIAGNOSIS — H2511 Age-related nuclear cataract, right eye: Secondary | ICD-10-CM | POA: Diagnosis not present

## 2022-12-22 DIAGNOSIS — H0102B Squamous blepharitis left eye, upper and lower eyelids: Secondary | ICD-10-CM | POA: Diagnosis not present

## 2022-12-22 DIAGNOSIS — H0102A Squamous blepharitis right eye, upper and lower eyelids: Secondary | ICD-10-CM | POA: Diagnosis not present

## 2022-12-22 DIAGNOSIS — H40013 Open angle with borderline findings, low risk, bilateral: Secondary | ICD-10-CM | POA: Diagnosis not present

## 2022-12-22 DIAGNOSIS — H52222 Regular astigmatism, left eye: Secondary | ICD-10-CM | POA: Diagnosis not present

## 2022-12-31 ENCOUNTER — Telehealth: Payer: Self-pay | Admitting: Family Medicine

## 2022-12-31 DIAGNOSIS — I10 Essential (primary) hypertension: Secondary | ICD-10-CM | POA: Diagnosis not present

## 2022-12-31 DIAGNOSIS — N2581 Secondary hyperparathyroidism of renal origin: Secondary | ICD-10-CM | POA: Diagnosis not present

## 2022-12-31 DIAGNOSIS — N1832 Chronic kidney disease, stage 3b: Secondary | ICD-10-CM | POA: Diagnosis not present

## 2022-12-31 DIAGNOSIS — N281 Cyst of kidney, acquired: Secondary | ICD-10-CM | POA: Diagnosis not present

## 2022-12-31 NOTE — Telephone Encounter (Signed)
Surgical Clearance form to be filled out--placed in dr's folder.  Call [phone] upon completion.

## 2023-01-01 LAB — LAB REPORT - SCANNED
Albumin, Urine POC: 49.4
Albumin/Creatinine Ratio, Urine, POC: 51
Creatinine, POC: 96.8 mg/dL
EGFR: 35

## 2023-01-12 ENCOUNTER — Encounter: Payer: Self-pay | Admitting: Family Medicine

## 2023-01-12 ENCOUNTER — Ambulatory Visit (INDEPENDENT_AMBULATORY_CARE_PROVIDER_SITE_OTHER): Payer: Medicare Other | Admitting: Family Medicine

## 2023-01-12 VITALS — BP 118/76 | HR 65 | Temp 97.9°F | Ht 71.0 in | Wt 205.0 lb

## 2023-01-12 DIAGNOSIS — N281 Cyst of kidney, acquired: Secondary | ICD-10-CM | POA: Diagnosis not present

## 2023-01-12 DIAGNOSIS — H269 Unspecified cataract: Secondary | ICD-10-CM | POA: Diagnosis not present

## 2023-01-12 DIAGNOSIS — Z8673 Personal history of transient ischemic attack (TIA), and cerebral infarction without residual deficits: Secondary | ICD-10-CM | POA: Diagnosis not present

## 2023-01-12 DIAGNOSIS — N1832 Chronic kidney disease, stage 3b: Secondary | ICD-10-CM

## 2023-01-12 DIAGNOSIS — I10 Essential (primary) hypertension: Secondary | ICD-10-CM

## 2023-01-12 DIAGNOSIS — C61 Malignant neoplasm of prostate: Secondary | ICD-10-CM

## 2023-01-12 NOTE — Progress Notes (Signed)
   Subjective:    Patient ID: Jimmy Rice, male    DOB: May 02, 1946, 77 y.o.   MRN: [id number]  HPI This is a patient of Dr. Caryl Never here for preoperative clearance to have both cataracts removed. He is scheduled to have the right one done on 02-12-23 and the left one done on 02-26-23. These surgeries will be performed in [place] by Dr. Shanda Bumps. The patient's son lives there, so he plans to recuperate at his son's house. Otherwise he feels fine. He has clearance forms for [place] to fill out.    Review of Systems  Constitutional: Negative.   Eyes:  Positive for visual disturbance.  Respiratory: Negative.    Cardiovascular: Negative.   Gastrointestinal: Negative.   Genitourinary: Negative.   Neurological: Negative.        Objective:   Physical Exam Constitutional:      Appearance: Normal appearance. He is not ill-appearing.  HENT:     Right Ear: Tympanic membrane, ear canal and external ear normal.     Left Ear: Tympanic membrane, ear canal and external ear normal.     Nose: Nose normal.     Mouth/Throat:     Pharynx: Oropharynx is clear.  Eyes:     Conjunctiva/sclera: Conjunctivae normal.     Pupils: Pupils are equal, round, and reactive to light.  Cardiovascular:     Rate and Rhythm: Normal rate and regular rhythm.     Pulses: Normal pulses.     Heart sounds: Normal heart sounds.  Pulmonary:     Effort: Pulmonary effort is normal.     Breath sounds: Normal breath sounds.  Abdominal:     General: Abdomen is flat. Bowel sounds are normal. There is no distension.     Palpations: Abdomen is soft. There is no mass.     Tenderness: There is no abdominal tenderness. There is no guarding or rebound.     Hernia: No hernia is present.  Lymphadenopathy:     Cervical: No cervical adenopathy.  Skin:    General: Skin is warm and dry.  Neurological:     General: No focal deficit present.     Mental Status: He is alert and oriented to person, place, and time.            Assessment & Plan:  He is cleared medically for removal of bilateral cataracts as above. The forms were filled out. We also provided him with printed copies of his recent CXR, EKG, and treadmill stress test. He had an ECHO at the [facility] a few months ago, and he will ask them for a copy of that. He doe snot take any blood thinner medications.  We spent a total of ( 35  ) minutes reviewing records and discussing these issues.  Gershon Crane, MD

## 2023-02-12 DIAGNOSIS — H2511 Age-related nuclear cataract, right eye: Secondary | ICD-10-CM | POA: Diagnosis not present

## 2023-04-28 ENCOUNTER — Encounter: Payer: Self-pay | Admitting: Podiatry

## 2023-04-28 ENCOUNTER — Ambulatory Visit: Payer: Medicare Other | Admitting: Podiatry

## 2023-04-28 VITALS — BP 145/78 | HR 79

## 2023-04-28 DIAGNOSIS — M79675 Pain in left toe(s): Secondary | ICD-10-CM | POA: Diagnosis not present

## 2023-04-28 DIAGNOSIS — M79674 Pain in right toe(s): Secondary | ICD-10-CM | POA: Diagnosis not present

## 2023-04-28 DIAGNOSIS — B351 Tinea unguium: Secondary | ICD-10-CM

## 2023-04-28 NOTE — Progress Notes (Signed)
   Chief Complaint  Patient presents with   Nail Problem    "I want to get the black rings off from around my foot." N - toenails black L - 1-5 right D - 15 years or more O - suddenly, about the same C - black ring A - none T - periodically tried antifungal lotions and salves    SUBJECTIVE Patient presents to office today complaining of elongated, thickened nails that cause pain while ambulating in shoes.  Patient also complains of unsightly discoloration to the toenails bilateral.  This is been present for over 15 years.  He notices dark streaking on the nail plates.  He has tried different topical antifungals with no improvement.  Patient is here for further evaluation and treatment.  Past Medical History:  Diagnosis Date   Allergy    SEASONAL   Arthritis    "right knee" (07/22/2016)   BENIGN PROSTATIC HYPERTROPHY, HX OF 04/04/2009   Blood transfusion    Cancer (HCC)    hx of porstate cancer   Cataract    BILATERAL   Childhood asthma    pt denies on proep of 12/10/21   Chronic kidney disease (CKD), stage III (moderate) (HCC)    cysts on kidneys, 3rd stage kidney disease   Complication of anesthesia    woke up "fighting"  confusion   GERD 01/18/2009   HYPERTENSION 01/18/2009   HYPOGONADISM 01/18/2009    Allergies  Allergen Reactions   Amlodipine Hives   Flomax [Tamsulosin Hcl] Other (See Comments)    DIFFICULTY BREATHING NASAL STUFFINESS   Tamsulosin Other (See Comments)    DIFFICULTY BREATHING NASAL STUFFINESS      OBJECTIVE General Patient is awake, alert, and oriented x 3 and in no acute distress. Derm Skin is dry and supple bilateral. Negative open lesions or macerations. Remaining integument unremarkable. Nails are tender, long, thickened and dystrophic with subungual debris, consistent with onychomycosis, 1-5 bilateral. No signs of infection noted.  There is some hyperpigmentation along the nails consistent with melanin pigmentation.  This does not present  like any irregularity or concerning pathology Vasc  DP and PT pedal pulses palpable bilaterally. Temperature gradient within normal limits.  Neuro grossly intact via light touch.  Musculoskeletal Exam No symptomatic pedal deformities noted bilateral. Muscular strength within normal limits.  ASSESSMENT 1.  Pain due to onychomycosis of toenails both  PLAN OF CARE 1. Patient evaluated today.  2. Instructed to maintain good pedal hygiene and foot care.  3. Mechanical debridement of nails 1-5 bilaterally performed using a nail nipper. Filed with dremel without incident.  4. Return to clinic as needed   Felecia Shelling, DPM [facility]  Dr. Felecia Shelling, DPM    [address] [place], [place] [address]                Office [phone]  Fax [phone]

## 2023-05-21 ENCOUNTER — Encounter (INDEPENDENT_AMBULATORY_CARE_PROVIDER_SITE_OTHER): Payer: Self-pay

## 2023-06-11 ENCOUNTER — Other Ambulatory Visit: Payer: Self-pay | Admitting: Family Medicine

## 2023-06-12 ENCOUNTER — Other Ambulatory Visit: Payer: Self-pay | Admitting: Family Medicine

## 2023-06-26 DIAGNOSIS — Z23 Encounter for immunization: Secondary | ICD-10-CM | POA: Diagnosis not present

## 2023-06-28 ENCOUNTER — Other Ambulatory Visit: Payer: Self-pay | Admitting: Family Medicine

## 2023-06-29 DIAGNOSIS — N2581 Secondary hyperparathyroidism of renal origin: Secondary | ICD-10-CM | POA: Diagnosis not present

## 2023-06-29 DIAGNOSIS — N1832 Chronic kidney disease, stage 3b: Secondary | ICD-10-CM | POA: Diagnosis not present

## 2023-06-29 DIAGNOSIS — I129 Hypertensive chronic kidney disease with stage 1 through stage 4 chronic kidney disease, or unspecified chronic kidney disease: Secondary | ICD-10-CM | POA: Diagnosis not present

## 2023-07-01 LAB — LAB REPORT - SCANNED
Albumin, Urine POC: 106.2
Creatinine, POC: 127.2 mg/dL
EGFR: 28
Microalb Creat Ratio: 85

## 2023-07-03 ENCOUNTER — Ambulatory Visit (INDEPENDENT_AMBULATORY_CARE_PROVIDER_SITE_OTHER): Payer: Medicare Other

## 2023-07-03 ENCOUNTER — Telehealth: Payer: Self-pay | Admitting: Family Medicine

## 2023-07-03 ENCOUNTER — Ambulatory Visit (INDEPENDENT_AMBULATORY_CARE_PROVIDER_SITE_OTHER): Payer: Medicare Other | Admitting: Family Medicine

## 2023-07-03 ENCOUNTER — Encounter: Payer: Self-pay | Admitting: Family Medicine

## 2023-07-03 VITALS — BP 118/64 | HR 62 | Temp 98.7°F | Ht 71.0 in | Wt 202.6 lb

## 2023-07-03 VITALS — BP 144/86 | HR 65 | Temp 97.5°F | Ht 71.0 in | Wt 201.9 lb

## 2023-07-03 DIAGNOSIS — E785 Hyperlipidemia, unspecified: Secondary | ICD-10-CM | POA: Diagnosis not present

## 2023-07-03 DIAGNOSIS — E1122 Type 2 diabetes mellitus with diabetic chronic kidney disease: Secondary | ICD-10-CM | POA: Diagnosis not present

## 2023-07-03 DIAGNOSIS — N1832 Chronic kidney disease, stage 3b: Secondary | ICD-10-CM | POA: Diagnosis not present

## 2023-07-03 DIAGNOSIS — N184 Chronic kidney disease, stage 4 (severe): Secondary | ICD-10-CM

## 2023-07-03 DIAGNOSIS — I10 Essential (primary) hypertension: Secondary | ICD-10-CM | POA: Diagnosis not present

## 2023-07-03 DIAGNOSIS — R350 Frequency of micturition: Secondary | ICD-10-CM | POA: Diagnosis not present

## 2023-07-03 DIAGNOSIS — Z0000 Encounter for general adult medical examination without abnormal findings: Secondary | ICD-10-CM | POA: Diagnosis not present

## 2023-07-03 DIAGNOSIS — Z794 Long term (current) use of insulin: Secondary | ICD-10-CM | POA: Diagnosis not present

## 2023-07-03 LAB — POC URINALSYSI DIPSTICK (AUTOMATED)
Bilirubin, UA: NEGATIVE
Blood, UA: NEGATIVE
Glucose, UA: NEGATIVE
Ketones, UA: NEGATIVE
Leukocytes, UA: NEGATIVE
Nitrite, UA: NEGATIVE
Protein, UA: POSITIVE — AB
Spec Grav, UA: 1.015 (ref 1.010–1.025)
Urobilinogen, UA: 0.2 U/dL
pH, UA: 6 (ref 5.0–8.0)

## 2023-07-03 LAB — POCT GLYCOSYLATED HEMOGLOBIN (HGB A1C): Hemoglobin A1C: 6.5 % — AB (ref 4.0–5.6)

## 2023-07-03 MED ORDER — TRESIBA FLEXTOUCH 200 UNIT/ML ~~LOC~~ SOPN: 10.0000 [IU] | PEN_INJECTOR | Freq: Every day | SUBCUTANEOUS | 1 refills | Status: DC

## 2023-07-03 MED ORDER — DEXCOM G7 SENSOR MISC: 3 refills | Status: DC

## 2023-07-03 NOTE — Patient Instructions (Signed)
A1C 6.5%  Start the insulin once daily  Reduce sugars and starches, as discussed  Set up 2 month follow up.

## 2023-07-03 NOTE — Telephone Encounter (Signed)
Pt is calling and insulin degludec (TRESIBA FLEXTOUCH) 200 UNIT/ML FlexTouch Pen need PA and please send to [facility]

## 2023-07-03 NOTE — Patient Instructions (Addendum)
[name] , Thank you for taking time to come for your Medicare Wellness Visit. I appreciate your ongoing commitment to your health goals. Please review the following plan we discussed and let me know if I can assist you in the future.   Referrals/Orders/Follow-Ups/Clinician Recommendations:   This is a list of the screening recommended for you and due dates:  Health Maintenance  Topic Date Due   COVID-19 Vaccine (5 - 2023-24 season) 06/07/2023   Medicare Annual Wellness Visit  07/02/2024   DTaP/Tdap/Td vaccine (5 - Td or Tdap) 07/16/2027   Pneumonia Vaccine  Completed   Flu Shot  Completed   Hepatitis C Screening  Completed   Zoster (Shingles) Vaccine  Completed   HPV Vaccine  Aged Out   Colon Cancer Screening  Discontinued    Advanced directives: (Copy Requested) Please bring a copy of your health care power of attorney and living will to the office to be added to your chart at your convenience.  Next Medicare Annual Wellness Visit scheduled for next year: Yes

## 2023-07-03 NOTE — Progress Notes (Signed)
Subjective:   Jimmy Rice is a 77 y.o. male who presents for Medicare Annual/Subsequent preventive examination.  Visit Complete: In person      Objective:    Today's Vitals   07/03/23 0953  BP: 118/64  Pulse: 62  Temp: 98.7 F (37.1 C)  TempSrc: Oral  SpO2: 96%  Weight: 202 lb 9.6 oz (91.9 kg)  Height: 5\' 11"  (1.803 m)   Body mass index is 28.26 kg/m.     07/03/2023   10:26 AM 06/24/2022   10:13 AM 12/17/2021    6:04 AM 12/10/2021   11:24 AM 08/07/2021   12:00 PM 06/11/2021   10:28 AM 11/30/2020   11:31 AM  Advanced Directives  Does Patient Have a Medical Advance Directive? Yes Yes Yes Yes No Yes No  Type of Estate agent of [place];Living will Healthcare Power of [place];Living will Healthcare Power of [place];Living will Healthcare Power of [place];Living will  Living will   Does patient want to make changes to medical advance directive?   No - Patient declined      Copy of Healthcare Power of Attorney in Chart? No - copy requested No - copy requested No - copy requested        Current Medications (verified) Outpatient Encounter Medications as of 07/03/2023  Medication Sig   atorvastatin (LIPITOR) 20 MG tablet TAKE 1 TABLET BY MOUTH ONCE  DAILY   diltiazem (CARDIZEM CD) 120 MG 24 hr capsule Take 1 capsule (120 mg total) by mouth daily.   losartan-hydrochlorothiazide (HYZAAR) 100-12.5 MG tablet TAKE 1 TABLET BY MOUTH DAILY   metoprolol succinate (TOPROL-XL) 50 MG 24 hr tablet TAKE 1 TABLET BY MOUTH DAILY  WITH OR IMMEDIATELY FOLLOWING A  MEAL   No facility-administered encounter medications on file as of 07/03/2023.    Allergies (verified) Amlodipine, Flomax [tamsulosin hcl], and Tamsulosin   History: Past Medical History:  Diagnosis Date   Allergy    SEASONAL   Arthritis    "right knee" (07/22/2016)   BENIGN PROSTATIC HYPERTROPHY, HX OF 04/04/2009   Blood transfusion    Cancer (HCC)    hx of porstate cancer   Cataract     BILATERAL   Childhood asthma    pt denies on proep of 12/10/21   Chronic kidney disease (CKD), stage III (moderate) (HCC)    cysts on kidneys, 3rd stage kidney disease   Complication of anesthesia    woke up "fighting"  confusion   GERD 01/18/2009   HYPERTENSION 01/18/2009   HYPOGONADISM 01/18/2009   Past Surgical History:  Procedure Laterality Date   COLONOSCOPY     2013   INGUINAL HERNIA REPAIR Right 1997   NASAL SINUS SURGERY  1997   PARTIAL KNEE ARTHROPLASTY Right 07/22/2016   Procedure: UNICOMPARTMENTAL KNEE ARTHROPLASTY;  Surgeon: Sheral Apley, MD;  Location: [facility];  Service: Orthopedics;  Laterality: Right;   PARTIAL KNEE ARTHROPLASTY Left 12/17/2021   Procedure: UNICOMPARTMENTAL KNEE;  Surgeon: Sheral Apley, MD;  Location: [facility];  Service: Orthopedics;  Laterality: Left;   PROSTATE BIOPSY  X 5   REPLACEMENT UNICONDYLAR JOINT KNEE Right 07/22/2016   TRANSURETHRAL RESECTION OF PROSTATE  12/2014   Family History  Problem Relation Age of Onset   Breast cancer Mother    Kidney disease Father    Breast cancer Sister    Bladder Cancer Brother    Stomach cancer Paternal Aunt    Colon cancer Neg Hx    Esophageal cancer Neg Hx  Rectal cancer Neg Hx    Colon polyps Neg Hx    Crohn's disease Neg Hx    Ulcerative colitis Neg Hx    Social History   Socioeconomic History   Marital status: Married    Spouse name: Not on file   Number of children: Not on file   Years of education: Not on file   Highest education level: Bachelor's degree (e.g., BA, AB, BS)  Occupational History   Not on file  Tobacco Use   Smoking status: Former    Types: Cigarettes    Passive exposure: Never   Smokeless tobacco: Never   Tobacco comments:    "quit smoking in the late 1960's; smoked < 1 pack cigarettes TOTAL"  Vaping Use   Vaping status: Never Used  Substance and Sexual Activity   Alcohol use: Not Currently    Alcohol/week: 1.0 standard drink of alcohol    Types: 1 Cans of  beer per week    Comment: none   Drug use: No   Sexual activity: Yes  Other Topics Concern   Not on file  Social History Narrative   Retired from hazardous waste transportation   Right handed   One story home   Social Determinants of Health   Financial Resource Strain: Low Risk  (07/03/2023)   Overall Financial Resource Strain (CARDIA)    Difficulty of Paying Living Expenses: Not hard at all  Food Insecurity: No Food Insecurity (07/03/2023)   Hunger Vital Sign    Worried About Running Out of Food in the Last Year: Never true    Ran Out of Food in the Last Year: Never true  Transportation Needs: No Transportation Needs (07/03/2023)   PRAPARE - Administrator, Civil Service (Medical): No    Lack of Transportation (Non-Medical): No  Physical Activity: Sufficiently Active (01/08/2023)   Exercise Vital Sign    Days of Exercise per Week: 5 days    Minutes of Exercise per Session: 140 min  Stress: No Stress Concern Present (07/03/2023)   [employer] of Occupational Health - Occupational Stress Questionnaire    Feeling of Stress : Not at all  Social Connections: Moderately Isolated (07/03/2023)   Social Connection and Isolation Panel [NHANES]    Frequency of Communication with Friends and Family: More than three times a week    Frequency of Social Gatherings with Friends and Family: More than three times a week    Attends Religious Services: Never    Database administrator or Organizations: No    Attends Engineer, structural: Never    Marital Status: Married    Tobacco Counseling Counseling given: Not Answered Tobacco comments: "quit smoking in the late 1960's; smoked < 1 pack cigarettes TOTAL"   Clinical Intake:  Pre-visit preparation completed: Yes Activities of Daily Living    07/03/2023   10:25 AM  In your present state of health, do you have any difficulty performing the following activities:  Hearing? 0  Vision? 0  Difficulty concentrating or  making decisions? 0  Walking or climbing stairs? 0  Dressing or bathing? 0  Doing errands, shopping? 0  Preparing Food and eating ? N  Using the Toilet? N  In the past six months, have you accidently leaked urine? N  Do you have problems with loss of bowel control? N  Managing your Medications? N  Managing your Finances? N  Housekeeping or managing your Housekeeping? N    Patient Care Team: Kristian Covey,  MD as PCP - General Iran Ouch, MD as PCP - Cardiology (Cardiology) Glendale Chard, DO as Consulting Physician (Neurology)  Indicate any recent Medical Services you may have received from other than [facility] providers in the past year (date may be approximate).     Assessment:   This is a routine wellness examination for [place].  Hearing/Vision screen Hearing Screening - Comments:: Denies hearing difficulties   Vision Screening - Comments:: Wears rx glasses - up to date with routine eye exams with  [facility]   Goals Addressed               This Visit's Progress     Increase physical activity (pt-stated)         Depression Screen    07/03/2023    9:59 AM 01/12/2023    2:01 PM 09/15/2022    3:20 PM 06/24/2022   10:14 AM 12/11/2021    9:20 AM 06/11/2021   10:26 AM 06/07/2020   10:38 AM  PHQ 2/9 Scores  PHQ - 2 Score 0 0 0 0 0 0 0  PHQ- 9 Score  0     0    Fall Risk    07/03/2023   10:25 AM 01/12/2023    2:01 PM 01/08/2023    6:06 PM 09/15/2022    3:20 PM 06/24/2022   10:13 AM  Fall Risk   Falls in the past year? 0 0 0 0 0  Number falls in past yr: 0 0  0 0  Injury with Fall? 0 0  0 0  Risk for fall due to : No Fall Risks No Fall Risks  No Fall Risks Medication side effect  Follow up Falls prevention discussed Falls evaluation completed  Falls evaluation completed Falls prevention discussed;Education provided;Falls evaluation completed    MEDICARE RISK AT HOME: Medicare Risk at Home Any stairs in or around the home?: No If so, are there any without  handrails?: No Home free of loose throw rugs in walkways, pet beds, electrical cords, etc?: Yes Adequate lighting in your home to reduce risk of falls?: Yes Life alert?: No Use of a cane, walker or w/c?: No Grab bars in the bathroom?: No Shower chair or bench in shower?: Yes Elevated toilet seat or a handicapped toilet?: No  TIMED UP AND GO:  Was the test performed?  Yes  Length of time to ambulate 10 feet: 10 sec Gait steady and fast without use of assistive device    Cognitive Function:    05/19/2018    3:31 PM  MMSE - Mini Mental State Exam  Not completed: --        07/03/2023   10:26 AM 06/24/2022   10:15 AM 06/11/2021   10:35 AM 06/07/2020   10:40 AM  6CIT Screen  What Year? 0 points 0 points 0 points 0 points  What month? 0 points 0 points 0 points 0 points  What time? 0 points 0 points 0 points 0 points  Count back from 20 0 points 0 points 0 points 0 points  Months in reverse 0 points 0 points 0 points 0 points  Repeat phrase 0 points 0 points 0 points 2 points  Total Score 0 points 0 points 0 points 2 points    Immunizations Immunization History  Administered Date(s) Administered   Fluad Quad(high Dose 65+) 06/07/2019   Influenza Split 06/17/2018   Influenza, High Dose Seasonal PF 06/18/2011, 07/10/2016, 06/17/2018, 06/26/2020   Influenza, Seasonal, Injecte, Preservative  Fre 12/17/2015   Influenza,inj,Quad PF,6+ Mos 06/17/2013   Influenza-Unspecified 07/12/2001, 08/19/2001, 07/06/2005, 08/18/2006, 08/10/2007, 07/25/2008, 08/15/2009, 08/06/2010, 07/15/2012, 05/06/2013, 07/28/2014, 07/07/2015, 07/08/2016, 07/06/2017, 07/07/2019, 06/23/2020, 06/27/2021, 05/29/2022   Moderna Sars-Covid-2 Vaccination 11/27/2019, 12/28/2019, 08/20/2020   Pneumococcal Conjugate-13 08/14/2014, 05/08/2015, 07/15/2017   Pneumococcal Polysaccharide-23 02/10/2011, 07/07/2015   Pneumococcal-Unspecified 08/19/2001   Td 10/06/2005   Tdap 01/04/2010, 05/08/2015, 07/15/2017   Zoster  Recombinant(Shingrix) 05/06/2022   Zoster, Live 05/12/2012    TDAP status: Up to date  Flu Vaccine status: Up to date  Pneumococcal vaccine status: Up to date  Covid-19 vaccine status: Declined, Education has been provided regarding the importance of this vaccine but patient still declined. Advised may receive this vaccine at local pharmacy or Health Dept.or vaccine clinic. Aware to provide a copy of the vaccination record if obtained from local pharmacy or Health Dept. Verbalized acceptance and understanding.  Qualifies for Shingles Vaccine? Yes   Zostavax completed Yes   Shingrix Completed?: Yes  Screening Tests Health Maintenance  Topic Date Due   COVID-19 Vaccine (5 - 2023-24 season) 06/07/2023   Medicare Annual Wellness (AWV)  07/02/2024   DTaP/Tdap/Td (5 - Td or Tdap) 07/16/2027   Pneumonia Vaccine 45+ Years old  Completed   INFLUENZA VACCINE  Completed   Hepatitis C Screening  Completed   Zoster Vaccines- Shingrix  Completed   HPV VACCINES  Aged Out   Colonoscopy  Discontinued    Health Maintenance  Health Maintenance Due  Topic Date Due   COVID-19 Vaccine (5 - 2023-24 season) 06/07/2023        Additional Screening:  Hepatitis C Screening: does qualify; Completed 11/03/16  Vision Screening: Recommended annual ophthalmology exams for early detection of glaucoma and other disorders of the eye. Is the patient up to date with their annual eye exam?  Yes  Who is the provider or what is the name of the office in which the patient attends annual eye exams? [facility] If pt is not established with a provider, would they like to be referred to a provider to establish care? No .   Dental Screening: Recommended annual dental exams for proper oral hygiene    Community Resource Referral / Chronic Care Management:  CRR required this visit?  No   CCM required this visit?  No     Plan:     I have personally reviewed and noted the following in the patient's  chart:   Medical and social history Use of alcohol, tobacco or illicit drugs  Current medications and supplements including opioid prescriptions. Patient is not currently taking opioid prescriptions. Functional ability and status Nutritional status Physical activity Advanced directives List of other physicians Hospitalizations, surgeries, and ER visits in previous 12 months Vitals Screenings to include cognitive, depression, and falls Referrals and appointments  In addition, I have reviewed and discussed with patient certain preventive protocols, quality metrics, and best practice recommendations. A written personalized care plan for preventive services as well as general preventive health recommendations were provided to patient.     Tillie Rung, LPN   [id number]   After Visit Summary: Given  Nurse Notes: None

## 2023-07-03 NOTE — Progress Notes (Signed)
Established Patient Office Visit  Subjective   Patient ID: Jimmy Rice, male    DOB: Nov 15, 1945  Age: 77 y.o. MRN: [id number]  Chief Complaint  Patient presents with   Urinary Frequency    Patient complains of urinary frequency, x1 month   Tingling    Patient complains of tingling in feet. X1 month   Polydipsia    HPI   Jimmy Rice is seen with some urine frequency and polydipsia for the past month or so.  He lost about 6 pounds.  Concerned his blood sugar may be up.  Last A1c was 5.8% back in May 2023.  He recently had labs with nephrologist and had blood sugar of 134 which is nonfasting.  Denies any burning with urination.  He does have chronic kidney disease with recent GFR of 28.  He has prostate cancer followed currently by [facility] urology.  They proposed possible arterial embolization procedure of the prostate and he is still trying to figure out if he wants to go that direction.  Recent MRI scan of the prostate was stable.  Frequently gets up 5-6 times at night which is very disruptive for his sleep and interfering with quality of life.  Sometimes has palpitations at night especially.  Wakes up with a regular heartbeat.  No chest pain.  Hyperlipidemia treated with Lipitor 20 mg daily.  Due for follow-up labs.  He has hypertension treated with losartan HCTZ, metoprolol, diltiazem  Past Medical History:  Diagnosis Date   Allergy    SEASONAL   Arthritis    "right knee" (07/22/2016)   BENIGN PROSTATIC HYPERTROPHY, HX OF 04/04/2009   Blood transfusion    Cancer (HCC)    hx of porstate cancer   Cataract    BILATERAL   Childhood asthma    pt denies on proep of 12/10/21   Chronic kidney disease (CKD), stage III (moderate) (HCC)    cysts on kidneys, 3rd stage kidney disease   Complication of anesthesia    woke up "fighting"  confusion   GERD 01/18/2009   HYPERTENSION 01/18/2009   HYPOGONADISM 01/18/2009   Past Surgical History:  Procedure Laterality Date   COLONOSCOPY      2013   INGUINAL HERNIA REPAIR Right 1997   NASAL SINUS SURGERY  1997   PARTIAL KNEE ARTHROPLASTY Right 07/22/2016   Procedure: UNICOMPARTMENTAL KNEE ARTHROPLASTY;  Surgeon: Sheral Apley, MD;  Location: [facility] OR;  Service: Orthopedics;  Laterality: Right;   PARTIAL KNEE ARTHROPLASTY Left 12/17/2021   Procedure: UNICOMPARTMENTAL KNEE;  Surgeon: Sheral Apley, MD;  Location: [facility];  Service: Orthopedics;  Laterality: Left;   PROSTATE BIOPSY  X 5   REPLACEMENT UNICONDYLAR JOINT KNEE Right 07/22/2016   TRANSURETHRAL RESECTION OF PROSTATE  12/2014    reports that he has quit smoking. His smoking use included cigarettes. He has never been exposed to tobacco smoke. He has never used smokeless tobacco. He reports that he does not currently use alcohol after a past usage of about 1.0 standard drink of alcohol per week. He reports that he does not use drugs. family history includes Bladder Cancer in his brother; Breast cancer in his mother and sister; Kidney disease in his father; Stomach cancer in his paternal aunt. Allergies  Allergen Reactions   Amlodipine Hives   Flomax [Tamsulosin Hcl] Other (See Comments)    DIFFICULTY BREATHING NASAL STUFFINESS   Tamsulosin Other (See Comments)    DIFFICULTY BREATHING NASAL STUFFINESS       Review  of Systems  Constitutional:  Positive for weight loss. Negative for chills, fever and malaise/fatigue.  Eyes:  Negative for blurred vision.  Respiratory:  Negative for shortness of breath.   Cardiovascular:  Negative for chest pain.  Genitourinary:  Positive for frequency.  Neurological:  Negative for dizziness, weakness and headaches.      Objective:     BP (!) 144/86 (BP Location: Left Arm, Patient Position: Sitting, Cuff Size: Normal)   Pulse 65   Temp (!) 97.5 F (36.4 C) (Oral)   Ht 5\' 11"  (1.803 m)   Wt 201 lb 14.4 oz (91.6 kg)   SpO2 100%   BMI 28.16 kg/m  BP Readings from Last 3 Encounters:  07/03/23 (!) 144/86  07/03/23  118/64  04/28/23 (!) 145/78   Wt Readings from Last 3 Encounters:  07/03/23 201 lb 14.4 oz (91.6 kg)  07/03/23 202 lb 9.6 oz (91.9 kg)  01/12/23 205 lb (93 kg)      Physical Exam Vitals reviewed.  Constitutional:      Appearance: Normal appearance.  Cardiovascular:     Rate and Rhythm: Normal rate and regular rhythm.  Pulmonary:     Effort: Pulmonary effort is normal.     Breath sounds: Normal breath sounds. No wheezing or rales.  Musculoskeletal:     Right lower leg: No edema.     Left lower leg: No edema.  Neurological:     Mental Status: He is alert.      Results for orders placed or performed in visit on 07/03/23  POCT Urinalysis Dipstick (Automated)  Result Value Ref Range   Color, UA Yellow    Clarity, UA Clear    Glucose, UA Negative Negative   Bilirubin, UA Negative    Ketones, UA Negative    Spec Grav, UA 1.015 1.010 - 1.025   Blood, UA Negative    pH, UA 6.0 5.0 - 8.0   Protein, UA Positive (A) Negative   Urobilinogen, UA 0.2 0.2 or 1.0 E.U./dL   Nitrite, UA Negative    Leukocytes, UA Negative Negative  POC HgB A1c  Result Value Ref Range   Hemoglobin A1C 6.5 (A) 4.0 - 5.6 %   HbA1c POC (<> result, manual entry)     HbA1c, POC (prediabetic range)     HbA1c, POC (controlled diabetic range)      Last CBC Lab Results  Component Value Date   WBC 4.0 02/07/2022   HGB 13.0 02/07/2022   HCT 40.2 02/07/2022   MCV 93.0 02/07/2022   MCH 30.6 12/17/2021   RDW 13.4 02/07/2022   PLT 186.0 02/07/2022   Last metabolic panel Lab Results  Component Value Date   GLUCOSE 137 (H) 02/07/2022   NA 140 02/07/2022   K 4.0 02/07/2022   CL 101 02/07/2022   CO2 30 02/07/2022   BUN 22 02/07/2022   CREATININE 1.74 (H) 02/07/2022   EGFR 28.0 07/01/2023   CALCIUM 9.6 02/07/2022   PROT 7.3 02/07/2022   ALBUMIN 4.3 02/07/2022   BILITOT 1.2 02/07/2022   ALKPHOS 63 02/07/2022   AST 14 02/07/2022   ALT 15 02/07/2022   ANIONGAP 4 (L) 12/10/2021   Last  lipids Lab Results  Component Value Date   CHOL 145 02/07/2022   HDL 44.20 02/07/2022   LDLCALC 76 02/07/2022   LDLDIRECT 106.0 05/19/2018   TRIG 126.0 02/07/2022   CHOLHDL 3 02/07/2022   Last hemoglobin A1c Lab Results  Component Value Date   HGBA1C 6.5 (A)  07/03/2023      The ASCVD Risk score (Arnett DK, et al., 2019) failed to calculate for the following reasons:   The patient has a prior MI or stroke diagnosis    Assessment & Plan:   #1 type 2 diabetes but criteria A1c 6.5% today.  Patient has had some recent urine frequency and thirst with modest weight loss which certainly may be related to some extent.  He does have chronic kidney disease which limits some choice of medications for treating his diabetes.  We discussed several items as follows  -Discussed diet in some detail.  He has a tendency to eat sweets such as donuts and sometimes ice cream.  Also fairly frequently drinks orange juice.  Scaled back high glycemic foods -Would like to get Dexcom continuous glucose monitor if we can get this covered by insurance -Would avoid metformin secondary to chronic kidney disease with recent GFR of 28.  Would also avoid SGLT2 such as Jardiance with GFR less than 30.  We decided to start Tresiba 10 units subcutaneous once daily.  Set up 78-month follow-up to reassess  #2 hypertension.  On multidrug regimen including losartan HCTZ, metoprolol, and diltiazem.  Continue current regimen.  Continue low-sodium diet.  #3 prostate cancer followed by [facility] urology.  #4 hyperlipidemia treated with atorvastatin 20 mg daily.  Patient nonfasting today.  Set up 57-month follow-up and get fasting lipids then along with hepatic panel. Return in about 2 months (around 09/02/2023).    Evelena Peat, MD

## 2023-07-06 ENCOUNTER — Other Ambulatory Visit (HOSPITAL_COMMUNITY): Payer: Self-pay

## 2023-07-06 NOTE — Telephone Encounter (Signed)
Pharmacy Patient Advocate Encounter   Received notification from Pt Calls Messages that prior authorization for TRESIBA 200 UNIT/ML FlexTouch Pen  is required/requested.   Insurance verification completed.   The patient is insured through  [employer] [facility]  .   Per test claim: Refill too soon. PA is not needed at this time. Medication was filled 07/03/23. Next eligible fill date is 08/10/23.   Called pharmacy. Patient has picked up prescription.

## 2023-07-06 NOTE — Telephone Encounter (Signed)
Left a message for the patient to return my call.  

## 2023-07-07 HISTORY — PX: OTHER SURGICAL HISTORY: SHX169

## 2023-07-07 NOTE — Telephone Encounter (Signed)
I spoke with the patient and informed him no authorization needed at this time. Patient requested refills be sent to [facility] mail service for future refills.

## 2023-07-07 NOTE — Telephone Encounter (Signed)
Pt returned call

## 2023-07-20 DIAGNOSIS — N1832 Chronic kidney disease, stage 3b: Secondary | ICD-10-CM | POA: Diagnosis not present

## 2023-07-21 ENCOUNTER — Other Ambulatory Visit: Payer: Self-pay | Admitting: Family Medicine

## 2023-07-22 DIAGNOSIS — N138 Other obstructive and reflux uropathy: Secondary | ICD-10-CM | POA: Diagnosis not present

## 2023-07-27 ENCOUNTER — Ambulatory Visit (INDEPENDENT_AMBULATORY_CARE_PROVIDER_SITE_OTHER): Payer: Medicare Other | Admitting: Family Medicine

## 2023-07-27 ENCOUNTER — Encounter: Payer: Self-pay | Admitting: Family Medicine

## 2023-07-27 VITALS — BP 130/76 | HR 66 | Temp 98.0°F | Ht 71.0 in | Wt 197.4 lb

## 2023-07-27 DIAGNOSIS — R634 Abnormal weight loss: Secondary | ICD-10-CM

## 2023-07-27 DIAGNOSIS — Z794 Long term (current) use of insulin: Secondary | ICD-10-CM

## 2023-07-27 DIAGNOSIS — N184 Chronic kidney disease, stage 4 (severe): Secondary | ICD-10-CM

## 2023-07-27 DIAGNOSIS — C61 Malignant neoplasm of prostate: Secondary | ICD-10-CM

## 2023-07-27 DIAGNOSIS — E1122 Type 2 diabetes mellitus with diabetic chronic kidney disease: Secondary | ICD-10-CM | POA: Diagnosis not present

## 2023-07-27 NOTE — Progress Notes (Signed)
Established Patient Office Visit  Subjective   Patient ID: Jimmy Rice, male    DOB: 04/16/1946  Age: 77 y.o. MRN: [id number]  Chief Complaint  Patient presents with   Weight Loss    HPI   Jimmy Rice is seen to discuss the following items  Some recent unintentional weight loss.  He states he is lost about 13 pounds based on his scales.  Still has good appetite.  Based on our scales his weight is down about 8 pounds from last January.  He does relate scaling back several dessert foods such as donuts, ice cream, and cakes after recent A1c increased to 6.5%.  He now takes low-dose [place] 10 units daily.  Denies any daily headaches, abdominal pain, chronic cough, hemoptysis, dysphagia, gross hematuria.  No fevers or night sweats. He had CT abdomen and pelvis last October which showed bilateral simple appearing renal cysts with no further workup recommended.  No other significant abnormalities noted.  Elevated glucose with recent A1c 6.5%.  Now taking [place] as above.  No recent hypoglycemic symptoms.  He has prostate cancer.  Followed currently at [facility]  They recommended his prostate embolization procedure and he had some questions regarding that.  This has been proposed for October 30.  He does have some ongoing BPH type symptoms.  Usually has to get up about 5 times at night to urinate which is very disturbing and can lead to a lot of fatigue.  Feels very sleepy during the day.  Past Medical History:  Diagnosis Date   Allergy    SEASONAL   Arthritis    "right knee" (07/22/2016)   BENIGN PROSTATIC HYPERTROPHY, HX OF 04/04/2009   Blood transfusion    Cancer (HCC)    hx of porstate cancer   Cataract    BILATERAL   Childhood asthma    pt denies on proep of 12/10/21   Chronic kidney disease (CKD), stage III (moderate) (HCC)    cysts on kidneys, 3rd stage kidney disease   Complication of anesthesia    woke up "fighting"  confusion   GERD 01/18/2009   HYPERTENSION 01/18/2009    HYPOGONADISM 01/18/2009   Past Surgical History:  Procedure Laterality Date   COLONOSCOPY     2013   INGUINAL HERNIA REPAIR Right 1997   NASAL SINUS SURGERY  1997   PARTIAL KNEE ARTHROPLASTY Right 07/22/2016   Procedure: UNICOMPARTMENTAL KNEE ARTHROPLASTY;  Surgeon: Sheral Apley, MD;  Location: [facility] OR;  Service: Orthopedics;  Laterality: Right;   PARTIAL KNEE ARTHROPLASTY Left 12/17/2021   Procedure: UNICOMPARTMENTAL KNEE;  Surgeon: Sheral Apley, MD;  Location: [facility] ORS;  Service: Orthopedics;  Laterality: Left;   PROSTATE BIOPSY  X 5   REPLACEMENT UNICONDYLAR JOINT KNEE Right 07/22/2016   TRANSURETHRAL RESECTION OF PROSTATE  12/2014    reports that he has quit smoking. His smoking use included cigarettes. He has never been exposed to tobacco smoke. He has never used smokeless tobacco. He reports that he does not currently use alcohol after a past usage of about 1.0 standard drink of alcohol per week. He reports that he does not use drugs. family history includes Bladder Cancer in his brother; Breast cancer in his mother and sister; Kidney disease in his father; Stomach cancer in his paternal aunt. Allergies  Allergen Reactions   Amlodipine Hives   Flomax [Tamsulosin Hcl] Other (See Comments)    DIFFICULTY BREATHING NASAL STUFFINESS   Tamsulosin Other (See Comments)    DIFFICULTY BREATHING  NASAL STUFFINESS     Review of Systems  Constitutional:  Negative for chills and fever.  Respiratory:  Negative for shortness of breath.   Cardiovascular:  Negative for chest pain.  Gastrointestinal:  Negative for abdominal pain, blood in stool and diarrhea.  Genitourinary:  Positive for frequency. Negative for hematuria.  Neurological:  Negative for headaches.      Objective:     BP 130/76 (BP Location: Left Arm, Patient Position: Sitting, Cuff Size: Normal)   Pulse 66   Temp 98 F (36.7 C) (Oral)   Ht 5\' 11"  (1.803 m)   Wt 197 lb 6.4 oz (89.5 kg)   SpO2 99%   BMI 27.53  kg/m  BP Readings from Last 3 Encounters:  07/27/23 130/76  07/06/23 (!) 144/86  07/03/23 118/64   Wt Readings from Last 3 Encounters:  07/27/23 197 lb 6.4 oz (89.5 kg)  07/03/23 201 lb 14.4 oz (91.6 kg)  07/03/23 202 lb 9.6 oz (91.9 kg)      Physical Exam Vitals reviewed.  Constitutional:      General: He is not in acute distress.    Appearance: Normal appearance.  Cardiovascular:     Rate and Rhythm: Normal rate and regular rhythm.  Pulmonary:     Effort: Pulmonary effort is normal.     Breath sounds: Normal breath sounds.  Neurological:     Mental Status: He is alert.      No results found for any visits on 07/27/23.  Last CBC Lab Results  Component Value Date   WBC 4.0 02/07/2022   HGB 13.0 02/07/2022   HCT 40.2 02/07/2022   MCV 93.0 02/07/2022   MCH 30.6 12/17/2021   RDW 13.4 02/07/2022   PLT 186.0 02/07/2022   Last metabolic panel Lab Results  Component Value Date   GLUCOSE 137 (H) 02/07/2022   NA 140 02/07/2022   K 4.0 02/07/2022   CL 101 02/07/2022   CO2 30 02/07/2022   BUN 22 02/07/2022   CREATININE 1.74 (H) 02/07/2022   EGFR 28.0 07/01/2023   CALCIUM 9.6 02/07/2022   PROT 7.3 02/07/2022   ALBUMIN 4.3 02/07/2022   BILITOT 1.2 02/07/2022   ALKPHOS 63 02/07/2022   AST 14 02/07/2022   ALT 15 02/07/2022   ANIONGAP 4 (L) 12/10/2021   Last hemoglobin A1c Lab Results  Component Value Date   HGBA1C 6.5 (A) 07/03/2023   Last thyroid functions Lab Results  Component Value Date   TSH 4.29 02/07/2022      The ASCVD Risk score (Arnett DK, et al., 2019) failed to calculate for the following reasons:   The patient has a prior MI or stroke diagnosis    Assessment & Plan:   #1 unintentional weight loss.  This has been modest.  He states 13 pounds.  Only 8 pounds since January.  Appetite maintained.  Suspect this may be related to recent dietary changes.  He has intentionally scaled back dessert foods such as donuts and ice cream.  He does have  type 2 diabetes but controlled with very low-dose [place].  Recent labs including CBC and CMP unremarkable.  Given very modest weight loss and the fact that he has good appetite we elected not to engage in further workup at this time as we suspect his modest weight loss probably related to recent dietary changes.  Continue to monitor closely and be in touch if he continues to lose  #2 type 2 diabetes with recent A1c 6.5%.  Controlled on  Evaristo Bury.  Follow-up in 2 months and recheck A1c then  #3 history of prostate cancer.  Patient looking at upcoming embolization procedure end of this month at [facility].  Evelena Peat, MD

## 2023-07-27 NOTE — Patient Instructions (Signed)
I think the weight should be leveling off soon if secondary to diet change  Let me know if weight not leveling off soon.

## 2023-08-05 DIAGNOSIS — N138 Other obstructive and reflux uropathy: Secondary | ICD-10-CM | POA: Diagnosis not present

## 2023-08-10 ENCOUNTER — Telehealth: Payer: Self-pay | Admitting: Family Medicine

## 2023-08-10 NOTE — Telephone Encounter (Signed)
Left a message for the patient to return my call.  

## 2023-08-10 NOTE — Telephone Encounter (Signed)
Pt is returning mykal call 

## 2023-08-10 NOTE — Telephone Encounter (Signed)
Pt states he had procedure done at [facility] on 08/06/23 for Arterial embolization. He would like provider to be aware this procedure was done.

## 2023-08-10 NOTE — Telephone Encounter (Signed)
Patient informed of the message below and voiced understanding  

## 2023-08-20 ENCOUNTER — Other Ambulatory Visit: Payer: Self-pay | Admitting: Family Medicine

## 2023-08-20 DIAGNOSIS — N138 Other obstructive and reflux uropathy: Secondary | ICD-10-CM | POA: Diagnosis not present

## 2023-08-21 ENCOUNTER — Encounter: Payer: Self-pay | Admitting: Cardiology

## 2023-08-21 ENCOUNTER — Ambulatory Visit: Payer: Medicare Other | Attending: Cardiology | Admitting: Cardiology

## 2023-08-21 VITALS — BP 124/78 | HR 64 | Ht 72.0 in | Wt 197.8 lb

## 2023-08-21 DIAGNOSIS — E785 Hyperlipidemia, unspecified: Secondary | ICD-10-CM

## 2023-08-21 DIAGNOSIS — C61 Malignant neoplasm of prostate: Secondary | ICD-10-CM

## 2023-08-21 DIAGNOSIS — I10 Essential (primary) hypertension: Secondary | ICD-10-CM | POA: Diagnosis not present

## 2023-08-21 DIAGNOSIS — R0600 Dyspnea, unspecified: Secondary | ICD-10-CM | POA: Diagnosis not present

## 2023-08-21 DIAGNOSIS — I493 Ventricular premature depolarization: Secondary | ICD-10-CM

## 2023-08-21 DIAGNOSIS — N1832 Chronic kidney disease, stage 3b: Secondary | ICD-10-CM

## 2023-08-21 NOTE — Progress Notes (Signed)
Cardiology Office Note:  .   Date:  08/21/2023  ID:  Jimmy Rice, DOB 12/05/1945, MRN [id number] PCP: Kristian Covey, MD  [facility] Providers Cardiologist:  Lorine Bears, MD    History of Present Illness: .   Jimmy Rice is a 77 y.o. 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   He was originally referred to cardiology for evaluations of palpitations and syncope.  In May 2020 he had 2 episodes of palpitations with associated orthostatic dizziness.  He had 1 episode where he had brief loss of consciousness after hitting his head on the mantle.  He followed up with neurology in June 2020 with an MRI of the brain with remote right cerebellar stroke, felt to be causing symptoms.  CT of the neck and brain did not show evidence of vascular etiology.  He had a monitor that was ordered by his PCP in July 2020 with normal sinus rhythm, PVCs, and 1 7 beat run of nonsustained VT.  He had previously been followed by cardiologist in [place] with extensive workup including echo, stress test, and monitor.  He was told by his PVCs started on Toprol with improvement in symptoms.  Stress echo completed in 06/2019 with no evidence of ischemia, echo in 08/2019 revealed an LVEF of 55 to 60%, mild LVH, moderate LA enlargement, RV normal size and function, no significant valvular abnormality.   He was last seen in clinic 02/01/23 by Dr Kirke Corin.  At that time he had been doing well with no chest pain, shortness of breath or palpitations.  He was told that he had a heart murmur and needed evaluation.  Stated that his heart murmur was found when he was in his 51s.  No heart murmur was noted on exam and there was no further workup that was needed.  There were no changes to his medications and no further testing that  was ordered.  On 10/24 he underwent successful particle embolization of the left and right prostatic artery at [facility].  There were no immediate postoperative complications.  He returns to clinic today stating from the cardiac perspective. He recently underwent a procedure at [facility] for his BPH. States that it went well and he has been able to sleep without recurrent trips to the restroom. He has noted some weight loss since making dietary changes after an elevated A1c.  He denies any chest discomfort, peripheral edema, lightheadedness or dizziness.  Stated that he has some occasional PND and orthopnea that was new for him.  States that he has been compliant with his medication regimen.  Denies any emergency department visits and any other hospitalizations since his procedure.  He has continued follow-up with urology for his prostate cancer.  ROS: 10 point review of systems has been reviewed and considered negative except what is been listed in the HPI  Studies Reviewed: Marland Kitchen   EKG Interpretation Date/Time:  Friday August 21 2023 11:04:15 EST Ventricular Rate:  67 PR Interval:  202 QRS Duration:  82 QT Interval:  396 QTC Calculation: 418 R Axis:   -28  Text Interpretation: Normal sinus rhythm , with 1st degree A-V block Left ventricular hypertrophy , Left axis deviation When compared with ECG of 23-Feb-2019 19:40, PREVIOUS ECG IS PRESENT Reconfirmed by Charlsie Quest ([id number]) on 08/21/2023 11:38:50 AM  Stress Echo 06/24/2019 (outside facility) Summary:   1. The test was terminated due to general fatigue.   2. There was no clinical, electrocardiographic, or echocardiographic evidence of ischemia at a above average level of exercise.   2D echo 06/24/2019 1. LV size is normal. LV wall thickness is mildly increased. There is mild left ventricular hypertrophy. Left ventricular systolic function is normal. No segmental wall motion abnormalities present.  2. There is moderate left atrial enlargement.  Right atrial size is normal.  3. Right ventricular size is normal. The right ventricular systolic function is normal.  4. Mild aortic valve leaflet thickening. There is no aortic valve stenosis. No aortic insufficiency is present. The aortic root size is upper limits of normal to borderline dilated at the Sinuses of Valsalva. The ascending aorta is normal.  5. Mild thickening of the mitral valve leaflets. Trace / physiologic mitral regurgitation is present.  6. The tricuspid valve is structurally normal. Trace tricuspid regurgitation is present. The structure of the pulmonic valve is normal. Mild pulmonary regurgitation is present.  7. No pericardial effusion.  8. The pulmonary artery systolic pressure is estimated to be 21 mmHg plus the RA pressure.   Risk Assessment/Calculations:             Physical Exam:   VS:  BP 124/78 (BP Location: Left Arm, Patient Position: Sitting, Cuff Size: Large)   Pulse 64   Ht 6' (1.829 m)   Wt 197 lb 12.8 oz (89.7 kg)   SpO2 98%   BMI 26.83 kg/m    Wt Readings from Last 3 Encounters:  08/21/23 197 lb 12.8 oz (89.7 kg)  07/27/23 197 lb 6.4 oz (89.5 kg)  07/03/23 201 lb 14.4 oz (91.6 kg)    GEN: Well nourished, well developed in no acute distress NECK: No JVD; No carotid bruits CARDIAC: RRR, no murmurs, rubs, gallops RESPIRATORY:  Clear to auscultation without rales, wheezing or rhonchi  ABDOMEN: Soft, non-tender, non-distended EXTREMITIES:  No edema; No deformity   ASSESSMENT AND PLAN: .   Essential hypertension with blood pressure 124/78.  Blood pressure remained stable.  He is continued on diltiazem 120 mg daily and losartan/HCTZ 100/12.5 mg daily and Toprol-XL 50 mg daily.  He has been encouraged to continue to monitor his blood pressure 1 to 2 hours postmedication administration at home.  Hyperlipidemia with prior LDL of 74.  Last LDL was done 02/2022.  He was sent for lipid panel today.  Currently continued on atorvastatin 20 mg daily.  Chronic  kidney disease with a serum creatinine of 2.3.  He continues to be followed by nephrology.  Symptomatic PVCs with symptoms well-controlled on current dose of metoprolol 50 mg daily and diltiazem 120 mg daily.  EKG today reveals no PVCs chest normal sinus rhythm, LVH, first-degree AV block.  Prostate cancer where he continues to be followed by oncology at [facility].  Recent occurrence of PND and occasional orthopnea.  Different findings for him.  She has been scheduled for an echocardiogram to follow-up to make sure there is been no changes.  He had previously been scheduled for an echocardiogram in January 2024 that was not completed.  He has been advised for any abnormal findings found on his study that his return appointment will be moved up.       Dispo: Patient to return to clinic to see MD/APP in 1 year or sooner if needed for reevaluation  Signed, Jacie Tristan, NP

## 2023-08-21 NOTE — Patient Instructions (Signed)
Medication Instructions:  Your physician recommends that you continue on your current medications as directed. Please refer to the Current Medication list given to you today.  *If you need a refill on your cardiac medications before your next appointment, please call your pharmacy*  Lab Work: Your provider would like for you to have following labs drawn today lipid panel.   If you have labs (blood work) drawn today and your tests are completely normal, you will receive your results only by: MyChart Message (if you have MyChart) OR A paper copy in the mail If you have any lab test that is abnormal or we need to change your treatment, we will call you to review the results.  Testing/Procedures: Your physician has requested that you have an echocardiogram. Echocardiography is a painless test that uses sound waves to create images of your heart. It provides your doctor with information about the size and shape of your heart and how well your heart's chambers and valves are working. This procedure takes approximately one hour. There are no restrictions for this procedure. Please do NOT wear cologne, perfume, aftershave, or lotions (deodorant is allowed). Please arrive 15 minutes prior to your appointment time.  Please note: We ask at that you not bring children with you during ultrasound (echo/ vascular) testing. Due to room size and safety concerns, children are not allowed in the ultrasound rooms during exams. Our front office staff cannot provide observation of children in our lobby area while testing is being conducted. An adult accompanying a patient to their appointment will only be allowed in the ultrasound room at the discretion of the ultrasound technician under special circumstances. We apologize for any inconvenience.   Follow-Up: At [facility], you and your health needs are our priority.  As part of our continuing mission to provide you with exceptional heart care, we have  created designated Provider Care Teams.  These Care Teams include your primary Cardiologist (physician) and Advanced Practice Providers (APPs -  Physician Assistants and Nurse Practitioners) who all work together to provide you with the care you need, when you need it.  Your next appointment:   1 year(s)  Provider:   Lorine Bears, MD    Other Instructions - None

## 2023-08-22 LAB — LIPID PANEL
Chol/HDL Ratio: 3.1 ratio (ref 0.0–5.0)
Cholesterol, Total: 133 mg/dL (ref 100–199)
HDL: 43 mg/dL (ref >39)
LDL Chol Calc (NIH): 68 mg/dL (ref 0–99)
Triglycerides: 125 mg/dL (ref 0–149)
VLDL Cholesterol Cal: 22 mg/dL (ref 5–40)

## 2023-08-24 NOTE — Progress Notes (Signed)
Cholesterol remains at goal. Continue current medication regimen without changes at this time.

## 2023-08-25 ENCOUNTER — Other Ambulatory Visit: Payer: Self-pay

## 2023-08-25 MED ORDER — METOPROLOL SUCCINATE ER 50 MG PO TB24: ORAL_TABLET | ORAL | 1 refills | Status: DC

## 2023-08-25 MED ORDER — ATORVASTATIN CALCIUM 20 MG PO TABS: 20.0000 mg | ORAL_TABLET | Freq: Every day | ORAL | 1 refills | Status: DC

## 2023-08-25 MED ORDER — DILTIAZEM HCL ER COATED BEADS 120 MG PO CP24: 120.0000 mg | ORAL_CAPSULE | Freq: Every day | ORAL | 1 refills | Status: DC

## 2023-08-25 MED ORDER — LOSARTAN POTASSIUM-HCTZ 100-12.5 MG PO TABS: 1.0000 | ORAL_TABLET | Freq: Every day | ORAL | 1 refills | Status: DC

## 2023-09-10 DIAGNOSIS — N138 Other obstructive and reflux uropathy: Secondary | ICD-10-CM | POA: Diagnosis not present

## 2023-09-14 ENCOUNTER — Ambulatory Visit: Payer: Medicare Other | Attending: Cardiology

## 2023-09-14 DIAGNOSIS — R0600 Dyspnea, unspecified: Secondary | ICD-10-CM

## 2023-09-14 LAB — ECHOCARDIOGRAM COMPLETE
Area-P 1/2: 2.91 cm2
Est EF: 55
S' Lateral: 3.7 cm

## 2023-09-15 NOTE — Progress Notes (Signed)
Heart squeeze noted to 55%, no wall motion abnormalities were noted, mild leakage in the mitral valve.  Overall reassuring study.

## 2023-09-16 ENCOUNTER — Other Ambulatory Visit: Payer: Self-pay | Admitting: Family Medicine

## 2023-09-22 ENCOUNTER — Other Ambulatory Visit: Payer: Self-pay | Admitting: Family Medicine

## 2023-10-30 ENCOUNTER — Other Ambulatory Visit: Payer: Self-pay

## 2023-10-30 ENCOUNTER — Encounter (HOSPITAL_COMMUNITY): Payer: Self-pay

## 2023-10-30 ENCOUNTER — Emergency Department (HOSPITAL_COMMUNITY)
Admission: EM | Admit: 2023-10-30 | Discharge: 2023-10-30 | Disposition: A | Discharge status: Home / Self Care | Payer: Medicare PPO | Attending: Emergency Medicine | Admitting: Emergency Medicine

## 2023-10-30 ENCOUNTER — Emergency Department (HOSPITAL_COMMUNITY): Payer: Medicare PPO

## 2023-10-30 DIAGNOSIS — X500XXA Overexertion from strenuous movement or load, initial encounter: Secondary | ICD-10-CM | POA: Diagnosis not present

## 2023-10-30 DIAGNOSIS — I129 Hypertensive chronic kidney disease with stage 1 through stage 4 chronic kidney disease, or unspecified chronic kidney disease: Secondary | ICD-10-CM | POA: Diagnosis not present

## 2023-10-30 DIAGNOSIS — N183 Chronic kidney disease, stage 3 unspecified: Secondary | ICD-10-CM | POA: Insufficient documentation

## 2023-10-30 DIAGNOSIS — S99912A Unspecified injury of left ankle, initial encounter: Secondary | ICD-10-CM | POA: Insufficient documentation

## 2023-10-30 DIAGNOSIS — J45909 Unspecified asthma, uncomplicated: Secondary | ICD-10-CM | POA: Insufficient documentation

## 2023-10-30 DIAGNOSIS — Z8546 Personal history of malignant neoplasm of prostate: Secondary | ICD-10-CM | POA: Diagnosis not present

## 2023-10-30 DIAGNOSIS — M25572 Pain in left ankle and joints of left foot: Secondary | ICD-10-CM | POA: Diagnosis not present

## 2023-10-30 DIAGNOSIS — S99812A Other specified injuries of left ankle, initial encounter: Secondary | ICD-10-CM | POA: Diagnosis not present

## 2023-10-30 MED ORDER — ACETAMINOPHEN 500 MG PO TABS
1000.0000 mg | ORAL_TABLET | Freq: Once | ORAL | Status: DC
  Filled 2023-10-30: qty 2

## 2023-10-30 NOTE — ED Triage Notes (Signed)
Pt came to ED and states "he popped something and thinks its his achilles" that happened an hour ago. Pt was pushing heavy weight when incident happened. No swelling on arrival.

## 2023-10-30 NOTE — ED Provider Triage Note (Signed)
Emergency Medicine Provider Triage Evaluation Note  Jimmy Rice , a 78 y.o. male  was evaluated in triage.  Pt complains of feeling a pop and pain behind his left ankle.  States he was pushing something this very heavy when this occurred.  Still able to walk, but with some pain.  Denies any numbness or tingling in the foot.  Review of Systems  Positive: As above Negative: As above  Physical Exam  BP 135/79 (BP Location: Right Arm)   Pulse 82   Temp 98.6 F (37 C)   Resp 17   Ht 6' (1.829 m)   Wt 88 kg   SpO2 98%   BMI 26.31 kg/m  Gen:   Awake, no distress   Resp:  Normal effort  MSK:   Moves extremities without difficulty.  Able to plantarflex and dorsiflex left ankle   Medical Decision Making  Medically screening exam initiated at 1:33 PM.  Appropriate orders placed.  IMIR BRUMBACH was informed that the remainder of the evaluation will be completed by another provider, this initial triage assessment does not replace that evaluation, and the importance of remaining in the ED until their evaluation is complete.     Arabella Merles, PA-C 10/30/23 1334

## 2023-10-30 NOTE — Discharge Instructions (Addendum)
You were seen in the emergency department for a left ankle injury.  Test performed while you are here included x-ray, which showed no evidence of fracture or dislocation.  Based on your presenting symptoms and physical exam, we are concerned you may have a partial Achilles tendon tear.  We placed you in a splint.  Please make sure you keep the splint dry.  You should remain nonweightbearing.  Please contact the orthopedic surgeon Dr. Aundria Rud above to arrange close follow-up.  You can take Tylenol as needed for pain. If you experience significantly increased pain, new numbness or tingling, or any other concerns, return to the ED for reevaluation.

## 2023-10-30 NOTE — ED Provider Notes (Signed)
[place] EMERGENCY DEPARTMENT AT [facility] Provider Note  MDM   HPI/ROS:  Jimmy Rice is a 78 y.o. male with pertinent past medical history of CKD, HTN, prostate cancer who presents left ankle injury.  Patient reports he was trying to push 1000 pound object at work today when he felt a pop in his left ankle.  Since this time, he is experiencing different weakness in his left foot with mild to moderate pain.  He denies any numbness or tingling.  He has been able to ambulate though with difficulty due to weakness.  He denies any prior injuries to this foot.  Physical exam is notable for: - Tenderness palpation over the distal aspect of the posterior calf, as well as over the insertion point of the Achilles tendon with some focal swelling -- Able to plantarflex left foot though significantly decreased from right.   -- Positive Thompson test  On my initial evaluation, patient is:  -Vital signs stable. Patient afebrile, hemodynamically stable, and non-toxic appearing. -X-ray obtained of left foot and ankle with no evidence of fracture, dislocation  This patient's current presentation, including their history and physical exam, is most consistent with a partial Achilles versus gastrocnemius tear.  Patient was able to plantarflex his left ankle more easily when flexed at the hip and knee, however when lying supine minimal plantarflexion obtain and with positive Thompson's test.  Bedside ultrasound performed and concern for possible area of discontinuity in the distal gastrocnemius/Achilles region.  Patient was placed in a short leg splint held in slight plantarflexion.  He was given information for orthopedics for close follow-up and was advised on nonweightbearing status.  He was comfortable with this plan.  Patient remained stable and had no acute events while in my care in the emergency department.  Disposition:  I discussed the plan for discharge with the patient and/or their  surrogate at bedside prior to discharge and they were in agreement with the plan and verbalized understanding of the return precautions provided. All questions answered to the best of my ability. Ultimately, the patient was discharged in stable condition with stable vital signs. I am reassured that they are capable of close follow up and good social support at home.   Clinical Impression: No diagnosis found.  Rx / DC Orders ED Discharge Orders     None       The plan for this patient was discussed with Dr. Theresia Lo, who voiced agreement and who oversaw evaluation and treatment of this patient.   Clinical Complexity A medically appropriate history, review of systems, and physical exam was performed.  My independent interpretations of EKG, labs, and radiology are documented in the ED course above.   If decision rules were used in this patient's evaluation, they are listed below.   Patient's presentation is most consistent with acute presentation with potential threat to life or bodily function.  Medical Decision Making Risk OTC drugs.    HPI/ROS      See MDM section for pertinent HPI and ROS. A complete ROS was performed with pertinent positives/negatives noted above.   Past Medical History:  Diagnosis Date   Allergy    SEASONAL   Arthritis    "right knee" (07/22/2016)   BENIGN PROSTATIC HYPERTROPHY, HX OF 04/04/2009   Blood transfusion    Cancer (HCC)    hx of porstate cancer   Cataract    BILATERAL   Childhood asthma    pt denies on proep of 12/10/21  Chronic kidney disease (CKD), stage III (moderate) (HCC)    cysts on kidneys, 3rd stage kidney disease   Complication of anesthesia    woke up "fighting"  confusion   GERD 01/18/2009   HYPERTENSION 01/18/2009   HYPOGONADISM 01/18/2009    Past Surgical History:  Procedure Laterality Date   arterial embolization of prostate gland  07/2023   COLONOSCOPY     2013   INGUINAL HERNIA REPAIR Right 1997   NASAL SINUS  SURGERY  1997   PARTIAL KNEE ARTHROPLASTY Right 07/22/2016   Procedure: UNICOMPARTMENTAL KNEE ARTHROPLASTY;  Surgeon: Sheral Apley, MD;  Location: [facility];  Service: Orthopedics;  Laterality: Right;   PARTIAL KNEE ARTHROPLASTY Left 12/17/2021   Procedure: UNICOMPARTMENTAL KNEE;  Surgeon: Sheral Apley, MD;  Location: [facility];  Service: Orthopedics;  Laterality: Left;   PROSTATE BIOPSY  X 5   REPLACEMENT UNICONDYLAR JOINT KNEE Right 07/22/2016   TRANSURETHRAL RESECTION OF PROSTATE  12/2014      Physical Exam   Vitals:   10/30/23 1314 10/30/23 1324 10/30/23 1655 10/30/23 1804  BP: 135/79  123/81 (!) 153/92  Pulse: 82  72 64  Resp: 17  16 16   Temp: 98.6 F (37 C)  98.2 F (36.8 C) 98.2 F (36.8 C)  TempSrc:   Oral Oral  SpO2: 98%  100% 100%  Weight:  88 kg    Height:  6' (1.829 m)      Physical Exam Gen: NAD. Appears comfortable HENT: Conjunctiva clear, PERRL, EOMI. MMM.  [place]/AT. CV: RRR. No M/R/G Pulm: Lungs CTAB with no wheezing, rales, or rhonchi.  GI: Abdomen soft, non-tender, non-distended.  MSK/Skin: Mild swelling over the posterior aspect of the left ankle with tenderness to palpation over the lower posterior calf and posterior heel.  Palpable prominence over the posterior heel.  Minimal active plantarflexion when supine, though slightly improved when sitting upright.  Positive Thompson test.  Able to dorsiflex without difficulty.  Otherwise full strength in bilateral lower extremities.  Able to ambulate though with significant difficulty due to reported weakness.  Sensation intact to light touch throughout bilateral lower extremities.  Neuro: A&Ox3. GCS 15.     Procedures   If procedures were preformed on this patient, they are listed below:  Procedures   Mikeal Hawthorne, MD Emergency Medicine PGY-2   Please note that this documentation was produced with the assistance of voice-to-text technology and may contain errors.    Mikeal Hawthorne, MD 10/31/23  0106    Elayne Snare K, DO 10/31/23 1502

## 2023-10-30 NOTE — Progress Notes (Signed)
Orthopedic Tech Progress Note Patient Details:  Jimmy Rice [birth year] [id number]  Applied short leg splint to patients LLE, provided crutches and crutch training.   Ortho Devices Type of Ortho Device: Cotton web roll, Short leg splint, Crutches Ortho Device/Splint Location: LLE Ortho Device/Splint Interventions: Ordered, Application   Post Interventions Patient Tolerated: Well Instructions Provided: Adjustment of device, Care of device, Poper ambulation with device  Diannia Ruder 10/30/2023, 7:56 PM

## 2023-11-03 ENCOUNTER — Encounter: Payer: Medicare PPO | Admitting: Family Medicine

## 2023-11-03 DIAGNOSIS — X500XXA Overexertion from strenuous movement or load, initial encounter: Secondary | ICD-10-CM | POA: Diagnosis not present

## 2023-11-03 DIAGNOSIS — M25572 Pain in left ankle and joints of left foot: Secondary | ICD-10-CM | POA: Diagnosis not present

## 2023-11-03 DIAGNOSIS — Y990 Civilian activity done for income or pay: Secondary | ICD-10-CM | POA: Diagnosis not present

## 2023-11-03 DIAGNOSIS — S86012A Strain of left Achilles tendon, initial encounter: Secondary | ICD-10-CM | POA: Diagnosis not present

## 2023-11-04 ENCOUNTER — Ambulatory Visit (INDEPENDENT_AMBULATORY_CARE_PROVIDER_SITE_OTHER): Payer: Medicare PPO | Admitting: Family Medicine

## 2023-11-04 ENCOUNTER — Encounter: Payer: Self-pay | Admitting: Family Medicine

## 2023-11-04 VITALS — BP 132/84 | HR 70 | Temp 98.2°F | Ht 72.0 in | Wt 201.5 lb

## 2023-11-04 DIAGNOSIS — E1122 Type 2 diabetes mellitus with diabetic chronic kidney disease: Secondary | ICD-10-CM | POA: Diagnosis not present

## 2023-11-04 DIAGNOSIS — N184 Chronic kidney disease, stage 4 (severe): Secondary | ICD-10-CM | POA: Diagnosis not present

## 2023-11-04 DIAGNOSIS — E785 Hyperlipidemia, unspecified: Secondary | ICD-10-CM | POA: Diagnosis not present

## 2023-11-04 DIAGNOSIS — N1832 Chronic kidney disease, stage 3b: Secondary | ICD-10-CM

## 2023-11-04 DIAGNOSIS — Z0000 Encounter for general adult medical examination without abnormal findings: Secondary | ICD-10-CM

## 2023-11-04 DIAGNOSIS — N183 Chronic kidney disease, stage 3 unspecified: Secondary | ICD-10-CM | POA: Diagnosis not present

## 2023-11-04 DIAGNOSIS — I10 Essential (primary) hypertension: Secondary | ICD-10-CM | POA: Diagnosis not present

## 2023-11-04 DIAGNOSIS — K219 Gastro-esophageal reflux disease without esophagitis: Secondary | ICD-10-CM | POA: Diagnosis not present

## 2023-11-04 DIAGNOSIS — Z01818 Encounter for other preprocedural examination: Secondary | ICD-10-CM | POA: Diagnosis not present

## 2023-11-04 DIAGNOSIS — Z8673 Personal history of transient ischemic attack (TIA), and cerebral infarction without residual deficits: Secondary | ICD-10-CM | POA: Diagnosis not present

## 2023-11-04 LAB — POCT GLYCOSYLATED HEMOGLOBIN (HGB A1C): Hemoglobin A1C: 6.5 % — AB (ref 4.0–5.6)

## 2023-11-04 NOTE — Progress Notes (Signed)
Established Patient Office Visit  Subjective   Patient ID: Jimmy Rice, male    DOB: 22-Apr-1946  Age: 78 y.o. MRN: [id number]  Chief Complaint  Patient presents with   Annual Exam    HPI   Jimmy Rice is seen for physical exam.  Unfortunately, he had injury at work 2 weeks ago and tore his left Achilles tendon.  He is planning to get surgery at [facility] this Friday.  He was pushing a heavy object when he felt a popping sensation left Achilles region.  Had some difficulties ambulating afterwards.  Ultrasound apparently confirmed tear.  He has chronic problems including hypertension, GERD, type 2 diabetes, prostate cancer, BPH, chronic kidney disease.  He had arterial embolization procedure recently for resistant BPH symptoms and has had tremendous improvement in urine flow since then.  Had been getting up about 4 times per night and now usually once.  He is very pleased with the results.  Last A1c 6.5%.  He declined using any insulin.  Currently not taking any diabetes medications.  He has made some dietary changes and is scaled back sugar intake consistently.    Health maintenance reviewed:  -Tetanus due 2028 -Needs diabetic eye exam -Has had Shingrix.  We had date of only 1 of those but has had 2 -Pneumonia vaccines complete -Aged out of further colonoscopies -Influenza vaccine already given -No history of RSV vaccine.  Family History  Problem Relation Age of Onset   Breast cancer Mother    Kidney disease Father    Breast cancer Sister    Bladder Cancer Brother    Stomach cancer Paternal Aunt    Colon cancer Neg Hx    Esophageal cancer Neg Hx    Rectal cancer Neg Hx    Colon polyps Neg Hx    Crohn's disease Neg Hx    Ulcerative colitis Neg Hx    Social History   Socioeconomic History   Marital status: Married    Spouse name: Not on file   Number of children: Not on file   Years of education: Not on file   Highest education level: Bachelor's degree (e.g., BA, AB,  BS)  Occupational History   Not on file  Tobacco Use   Smoking status: Former    Types: Cigarettes    Passive exposure: Never   Smokeless tobacco: Never   Tobacco comments:    "quit smoking in the late 1960's; smoked < 1 pack cigarettes TOTAL"  Vaping Use   Vaping status: Never Used  Substance and Sexual Activity   Alcohol use: Not Currently    Alcohol/week: 1.0 standard drink of alcohol    Types: 1 Cans of beer per week    Comment: none   Drug use: No   Sexual activity: Yes  Other Topics Concern   Not on file  Social History Narrative   Retired from hazardous waste [employer]   Right handed   One story home   Social Drivers of Health   Financial Resource Strain: Low Risk  (11/02/2023)   Received from [facility]   Overall Financial Resource Strain (CARDIA)    Difficulty of Paying Living Expenses: Not hard at all  Food Insecurity: No Food Insecurity (11/02/2023)   Received from [facility]   Hunger Vital Sign    Worried About Running Out of Food in the Last Year: Never true    Ran Out of Food in the Last Year: Never true  Transportation Needs:  No Transportation Needs (11/02/2023)   Received from [facility] - Transportation    In the past 12 months, has lack of transportation kept you from medical appointments or from getting medications?: No    Lack of Transportation (Non-Medical): No  Physical Activity: Sufficiently Active (01/08/2023)   Exercise Vital Sign    Days of Exercise per Week: 5 days    Minutes of Exercise per Session: 140 min  Stress: No Stress Concern Present (07/03/2023)   [employer] of Occupational Health - Occupational Stress Questionnaire    Feeling of Stress : Not at all  Social Connections: Moderately Isolated (07/03/2023)   Social Connection and Isolation Panel [NHANES]    Frequency of Communication with Friends and Family: More than three times a week    Frequency of Social  Gatherings with Friends and Family: More than three times a week    Attends Religious Services: Never    Database administrator or Organizations: No    Attends Banker Meetings: Never    Marital Status: Married  Catering manager Violence: Not At Risk (07/03/2023)   Humiliation, Afraid, Rape, and Kick questionnaire    Fear of Current or Ex-Partner: No    Emotionally Abused: No    Physically Abused: No    Sexually Abused: No   Past Medical History:  Diagnosis Date   Allergy    SEASONAL   Arthritis    "right knee" (07/22/2016)   BENIGN PROSTATIC HYPERTROPHY, HX OF 04/04/2009   Blood transfusion    Cancer (HCC)    hx of porstate cancer   Cataract    BILATERAL   Childhood asthma    pt denies on proep of 12/10/21   Chronic kidney disease (CKD), stage III (moderate) (HCC)    cysts on kidneys, 3rd stage kidney disease   Complication of anesthesia    woke up "fighting"  confusion   GERD 01/18/2009   HYPERTENSION 01/18/2009   HYPOGONADISM 01/18/2009   Past Surgical History:  Procedure Laterality Date   arterial embolization of prostate gland  07/2023   COLONOSCOPY     2013   INGUINAL HERNIA REPAIR Right 1997   NASAL SINUS SURGERY  1997   PARTIAL KNEE ARTHROPLASTY Right 07/22/2016   Procedure: UNICOMPARTMENTAL KNEE ARTHROPLASTY;  Surgeon: Sheral Apley, MD;  Location: [facility];  Service: Orthopedics;  Laterality: Right;   PARTIAL KNEE ARTHROPLASTY Left 12/17/2021   Procedure: UNICOMPARTMENTAL KNEE;  Surgeon: Sheral Apley, MD;  Location: [facility];  Service: Orthopedics;  Laterality: Left;   PROSTATE BIOPSY  X 5   REPLACEMENT UNICONDYLAR JOINT KNEE Right 07/22/2016   TRANSURETHRAL RESECTION OF PROSTATE  12/2014    reports that he has quit smoking. His smoking use included cigarettes. He has never been exposed to tobacco smoke. He has never used smokeless tobacco. He reports that he does not currently use alcohol after a past usage of about 1.0 standard drink of  alcohol per week. He reports that he does not use drugs. family history includes Bladder Cancer in his brother; Breast cancer in his mother and sister; Kidney disease in his father; Stomach cancer in his paternal aunt. Allergies  Allergen Reactions   Amlodipine Hives   Flomax [Tamsulosin Hcl] Other (See Comments)    DIFFICULTY BREATHING NASAL STUFFINESS   Tamsulosin Other (See Comments)    DIFFICULTY BREATHING NASAL STUFFINESS     Review of Systems  Constitutional:  Negative for chills, fever and malaise/fatigue.  Eyes:  Negative for blurred vision.  Respiratory:  Negative for shortness of breath.   Cardiovascular:  Negative for chest pain.  Gastrointestinal:  Negative for abdominal pain.  Neurological:  Negative for dizziness, weakness and headaches.      Objective:     BP 132/84 (BP Location: Left Arm, Cuff Size: Normal)   Pulse 70   Temp 98.2 F (36.8 C) (Oral)   Ht 6' (1.829 m)   Wt 201 lb 8 oz (91.4 kg)   SpO2 99%   BMI 27.33 kg/m  BP Readings from Last 3 Encounters:  11/04/23 132/84  10/30/23 (!) 155/98  08/21/23 124/78   Wt Readings from Last 3 Encounters:  11/04/23 201 lb 8 oz (91.4 kg)  10/30/23 194 lb (88 kg)  08/21/23 197 lb 12.8 oz (89.7 kg)      Physical Exam Vitals reviewed.  Constitutional:      General: He is not in acute distress.    Appearance: He is well-developed. He is not ill-appearing.  HENT:     Right Ear: External ear normal.     Left Ear: External ear normal.  Eyes:     Pupils: Pupils are equal, round, and reactive to light.  Neck:     Thyroid: No thyromegaly.  Cardiovascular:     Rate and Rhythm: Normal rate and regular rhythm.  Pulmonary:     Effort: Pulmonary effort is normal. No respiratory distress.     Breath sounds: Normal breath sounds. No wheezing or rales.  Musculoskeletal:     Cervical back: Neck supple.     Comments: Left lower extremity is in a cam walker.  Neurological:     Mental Status: He is alert and  oriented to person, place, and time.      Results for orders placed or performed in visit on 11/04/23  POC HgB A1c  Result Value Ref Range   Hemoglobin A1C 6.5 (A) 4.0 - 5.6 %   HbA1c POC (<> result, manual entry)     HbA1c, POC (prediabetic range)     HbA1c, POC (controlled diabetic range)        The ASCVD Risk score (Arnett DK, et al., 2019) failed to calculate for the following reasons:   Risk score cannot be calculated because patient has a medical history suggesting prior/existing ASCVD    Assessment & Plan:   #1 physical exam.  78 year old male with chronic medical problems as above who is seen today for physical/well visit.  We discussed the following health maintenance items  -Immunizations up-to-date with exception of RSV vaccine.  He will consider at some point this year. -Aged out of further colonoscopies -Continue annual flu vaccine  #2 type 2 diabetes/borderline.  A1c today 6.5% which is unchanged from a few months ago.  Continue low glycemic diet.  Patient reluctant to start medications at this point.  Recommend at least every 40-month A1c  #3 hypertension.  Initial blood pressure up today but did improve some after rest.  Continue diltiazem along with losartan HCTZ and metoprolol.  #4 left Achilles tear.  Patient scheduled for surgery in 2 days.  We strongly advocate that he follow Ortho guidelines closely with regard to Xarelto for DVT prevention.  We reemphasized his enormous risk of DVT without anticoagulant medication   No follow-ups on file.    Evelena Peat, MD

## 2023-11-06 DIAGNOSIS — I129 Hypertensive chronic kidney disease with stage 1 through stage 4 chronic kidney disease, or unspecified chronic kidney disease: Secondary | ICD-10-CM | POA: Diagnosis not present

## 2023-11-06 DIAGNOSIS — X500XXA Overexertion from strenuous movement or load, initial encounter: Secondary | ICD-10-CM | POA: Diagnosis not present

## 2023-11-06 DIAGNOSIS — G8918 Other acute postprocedural pain: Secondary | ICD-10-CM | POA: Diagnosis not present

## 2023-11-06 DIAGNOSIS — Z79899 Other long term (current) drug therapy: Secondary | ICD-10-CM | POA: Diagnosis not present

## 2023-11-06 DIAGNOSIS — N189 Chronic kidney disease, unspecified: Secondary | ICD-10-CM | POA: Diagnosis not present

## 2023-11-06 DIAGNOSIS — Z8546 Personal history of malignant neoplasm of prostate: Secondary | ICD-10-CM | POA: Diagnosis not present

## 2023-11-06 DIAGNOSIS — R011 Cardiac murmur, unspecified: Secondary | ICD-10-CM | POA: Diagnosis not present

## 2023-11-06 DIAGNOSIS — S86012A Strain of left Achilles tendon, initial encounter: Secondary | ICD-10-CM | POA: Diagnosis not present

## 2023-11-10 ENCOUNTER — Telehealth: Payer: Self-pay | Admitting: Cardiovascular Disease

## 2023-11-10 DIAGNOSIS — C61 Malignant neoplasm of prostate: Secondary | ICD-10-CM | POA: Diagnosis not present

## 2023-11-10 DIAGNOSIS — N5203 Combined arterial insufficiency and corporo-venous occlusive erectile dysfunction: Secondary | ICD-10-CM | POA: Diagnosis not present

## 2023-11-10 NOTE — Telephone Encounter (Signed)
   Pre-operative Risk Assessment    Patient Name: Jimmy Rice  DOB: July 29, 1946 MRN: [id number]   Date of last office visit: 08/21/23 [facility] Lunch Date of next office visit: n/a   Request for Surgical Clearance    Procedure:   placement of inflatable penile prosthesis  Date of Surgery:  Clearance 12/02/23                                Surgeon:  Dr Jordan Foreman Surgeon's Group or Practice Name:  [facility] number:  [phone] Fax number:  [phone]   Type of Clearance Requested:   - Pharmacy:  Hold Rivaroxaban  (Xarelto ) instructions   Type of Anesthesia:  Not Indicated   Additional requests/questions:    Bonney Rosina Stamps   11/10/2023, 2:10 PM

## 2023-11-12 NOTE — Telephone Encounter (Signed)
 Xarelto prescribed for DVT prophylaxis after ankle surgery. Will defer to  Schweitzer, Bayard Limbo, FNP, who was the prescribing provider.

## 2023-11-13 NOTE — Telephone Encounter (Signed)
 Preoperative team, patient's Xarelto  was prescribed by FNP post ankle surgery.  Recommendations for holding Xarelto  will need to come from prescribing provider.  Thank you.  Please contact requesting office and patient.  Thank you.  Josefa HERO. Gregor Dershem NP-C     11/13/2023, 8:34 AM [facility] [address] Office [phone] Fax [phone]

## 2023-11-13 NOTE — Telephone Encounter (Signed)
 I will fax notes to requesting office with notes from preop APP Josefa Beauvais, FNP:   Beauvais Josefa HERO, NP  Nurse Practitioner Specialty: Cardiology   Telephone Encounter Signed   Creation Time: 11/13/2023  8:33 AM   Signed     Preoperative team, patient's Xarelto  was prescribed by FNP post ankle surgery.  Recommendations for holding Xarelto  will need to come from prescribing provider.  Thank you.  Please contact requesting office and patient.  Thank you.   Josefa HERO. Cleaver NP-C      11/13/2023, 8:34 AM [facility] [address] Office [phone] Fax [phone]

## 2023-11-19 DIAGNOSIS — Y990 Civilian activity done for income or pay: Secondary | ICD-10-CM | POA: Diagnosis not present

## 2023-11-19 DIAGNOSIS — X500XXA Overexertion from strenuous movement or load, initial encounter: Secondary | ICD-10-CM | POA: Diagnosis not present

## 2023-11-19 DIAGNOSIS — S86012A Strain of left Achilles tendon, initial encounter: Secondary | ICD-10-CM | POA: Diagnosis not present

## 2023-11-24 DIAGNOSIS — S86012D Strain of left Achilles tendon, subsequent encounter: Secondary | ICD-10-CM | POA: Diagnosis not present

## 2023-11-24 DIAGNOSIS — X500XXD Overexertion from strenuous movement or load, subsequent encounter: Secondary | ICD-10-CM | POA: Diagnosis not present

## 2023-12-08 DIAGNOSIS — M66872 Spontaneous rupture of other tendons, left ankle and foot: Secondary | ICD-10-CM | POA: Diagnosis not present

## 2023-12-08 DIAGNOSIS — S86091A Other specified injury of right Achilles tendon, initial encounter: Secondary | ICD-10-CM | POA: Diagnosis not present

## 2023-12-14 DIAGNOSIS — N1832 Chronic kidney disease, stage 3b: Secondary | ICD-10-CM | POA: Diagnosis not present

## 2023-12-15 ENCOUNTER — Encounter: Payer: Self-pay | Admitting: Family Medicine

## 2023-12-15 ENCOUNTER — Ambulatory Visit (INDEPENDENT_AMBULATORY_CARE_PROVIDER_SITE_OTHER): Admitting: Family Medicine

## 2023-12-15 VITALS — BP 120/72 | HR 83 | Temp 98.8°F | Ht 72.0 in | Wt 192.9 lb

## 2023-12-15 DIAGNOSIS — Z794 Long term (current) use of insulin: Secondary | ICD-10-CM | POA: Diagnosis not present

## 2023-12-15 DIAGNOSIS — E1122 Type 2 diabetes mellitus with diabetic chronic kidney disease: Secondary | ICD-10-CM | POA: Diagnosis not present

## 2023-12-15 DIAGNOSIS — Z7984 Long term (current) use of oral hypoglycemic drugs: Secondary | ICD-10-CM

## 2023-12-15 DIAGNOSIS — N184 Chronic kidney disease, stage 4 (severe): Secondary | ICD-10-CM | POA: Diagnosis not present

## 2023-12-15 DIAGNOSIS — N1832 Chronic kidney disease, stage 3b: Secondary | ICD-10-CM | POA: Diagnosis not present

## 2023-12-15 DIAGNOSIS — I10 Essential (primary) hypertension: Secondary | ICD-10-CM

## 2023-12-15 MED ORDER — EMPAGLIFLOZIN 10 MG PO TABS: 10.0000 mg | ORAL_TABLET | Freq: Every day | ORAL | 3 refills | Status: DC

## 2023-12-15 NOTE — Progress Notes (Signed)
 Established Patient Office Visit  Subjective   Patient ID: Jimmy Rice, male    DOB: [birth year]  Age: 78 y.o. MRN: [id number]  Chief Complaint  Patient presents with   Diabetes    HPI   Jimmy Rice is seen for medical follow-up.  He had recent left Achilles tear surgery which went well.  He is in recovery from that.  Just recently got off his boot.  He is here to discuss diabetes management.  He had recent A1c at the [place] 6.8%.  This compares to 6.5% back in late January.  He apparently took himself off [place] a few months ago.  He does not recall exactly why.  Was not having any hypoglycemic symptoms.  He would like to consider possible oral options.  He does have chronic kidney disease with most recent GFR 38.  Occasional polyuria and polydipsia.  He has hypertension which is treated with diltiazem, losartan HCTZ, and metoprolol.  Blood pressures been very stable.  He had some readings even below 110 systolic.  He has history of BPH and prostate cancer.  Had prostate artery embolization and PSA went from 11-2.  His flow is much improved and he is very pleased overall.  Past Medical History:  Diagnosis Date   Allergy    SEASONAL   Arthritis    "right knee" (07/22/2016)   BENIGN PROSTATIC HYPERTROPHY, HX OF 04/04/2009   Blood transfusion    Cancer (HCC)    hx of porstate cancer   Cataract    BILATERAL   Childhood asthma    pt denies on proep of 12/10/21   Chronic kidney disease (CKD), stage III (moderate) (HCC)    cysts on kidneys, 3rd stage kidney disease   Complication of anesthesia    woke up "fighting"  confusion   GERD 01/18/2009   HYPERTENSION 01/18/2009   HYPOGONADISM 01/18/2009   Past Surgical History:  Procedure Laterality Date   arterial embolization of prostate gland  07/2023   COLONOSCOPY     2013   INGUINAL HERNIA REPAIR Right 1997   NASAL SINUS SURGERY  1997   PARTIAL KNEE ARTHROPLASTY Right 07/22/2016   Procedure: UNICOMPARTMENTAL KNEE  ARTHROPLASTY;  Surgeon: Sheral Apley, MD;  Location: [facility] OR;  Service: Orthopedics;  Laterality: Right;   PARTIAL KNEE ARTHROPLASTY Left 12/17/2021   Procedure: UNICOMPARTMENTAL KNEE;  Surgeon: Sheral Apley, MD;  Location: [facility];  Service: Orthopedics;  Laterality: Left;   PROSTATE BIOPSY  X 5   REPLACEMENT UNICONDYLAR JOINT KNEE Right 07/22/2016   TRANSURETHRAL RESECTION OF PROSTATE  12/2014    reports that he has quit smoking. His smoking use included cigarettes. He has never been exposed to tobacco smoke. He has never used smokeless tobacco. He reports that he does not currently use alcohol after a past usage of about 1.0 standard drink of alcohol per week. He reports that he does not use drugs. family history includes Bladder Cancer in his brother; Breast cancer in his mother and sister; Kidney disease in his father; Stomach cancer in his paternal aunt. Allergies  Allergen Reactions   Amlodipine Hives   Flomax [Tamsulosin Hcl] Other (See Comments)    DIFFICULTY BREATHING NASAL STUFFINESS   Tamsulosin Other (See Comments)    DIFFICULTY BREATHING NASAL STUFFINESS     Review of Systems  Constitutional:  Negative for malaise/fatigue.  Eyes:  Negative for blurred vision.  Respiratory:  Negative for shortness of breath.   Cardiovascular:  Negative for chest pain.  Neurological:  Negative for dizziness, weakness and headaches.      Objective:     BP 120/72 (BP Location: Left Arm, Patient Position: Sitting, Cuff Size: Normal)   Pulse 83   Temp 98.8 F (37.1 C) (Oral)   Ht 6' (1.829 m)   Wt 192 lb 14.4 oz (87.5 kg)   SpO2 98%   BMI 26.16 kg/m  BP Readings from Last 3 Encounters:  12/15/23 120/72  11/04/23 132/84  10/30/23 (!) 155/98   Wt Readings from Last 3 Encounters:  12/15/23 192 lb 14.4 oz (87.5 kg)  11/04/23 201 lb 8 oz (91.4 kg)  10/30/23 194 lb (88 kg)      Physical Exam Vitals reviewed.  Constitutional:      General: He is not in acute distress.     Appearance: He is well-developed. He is not ill-appearing.  Eyes:     Pupils: Pupils are equal, round, and reactive to light.  Neck:     Thyroid: No thyromegaly.  Cardiovascular:     Rate and Rhythm: Normal rate and regular rhythm.  Pulmonary:     Effort: Pulmonary effort is normal. No respiratory distress.     Breath sounds: Normal breath sounds. No wheezing or rales.  Musculoskeletal:     Cervical back: Neck supple.     Right lower leg: No edema.     Left lower leg: No edema.  Neurological:     Mental Status: He is alert and oriented to person, place, and time.      No results found for any visits on 12/15/23.    The ASCVD Risk score (Arnett DK, et al., 2019) failed to calculate for the following reasons:   Risk score cannot be calculated because patient has a medical history suggesting prior/existing ASCVD    Assessment & Plan:   #1 type 2 diabetes.  Recent A1c 6.8%.  Has had couple months of relative inactivity from his Achilles surgery.  He hopes to pick this up in the next few months.  He has previously been on [place] and would like to look at possible oral diabetes medication options.  Would try to avoid metformin with his relatively low GFR.  He does have GFR over 30 so SGLT2 medications are an option.  We did discuss possible initiation of Jardiance 10 mg once daily and reviewed potential side effects.  Set up 62-month follow-up.  Check urine microalbumin at follow-up  #2 hypertension stable and well-controlled on regimen as above.  Continue current regimen of diltiazem CD, losartan HCTZ, and metoprolol.  Continue low-sodium diet  #3 chronic kidney disease stable with GFR around 39   Return in about 3 months (around 03/16/2024).    Jimmy Peat, MD

## 2023-12-23 DIAGNOSIS — Z8673 Personal history of transient ischemic attack (TIA), and cerebral infarction without residual deficits: Secondary | ICD-10-CM | POA: Diagnosis not present

## 2023-12-23 DIAGNOSIS — N401 Enlarged prostate with lower urinary tract symptoms: Secondary | ICD-10-CM | POA: Diagnosis not present

## 2023-12-23 DIAGNOSIS — K219 Gastro-esophageal reflux disease without esophagitis: Secondary | ICD-10-CM | POA: Diagnosis not present

## 2023-12-23 DIAGNOSIS — N183 Chronic kidney disease, stage 3 unspecified: Secondary | ICD-10-CM | POA: Diagnosis not present

## 2023-12-23 DIAGNOSIS — N138 Other obstructive and reflux uropathy: Secondary | ICD-10-CM | POA: Diagnosis not present

## 2023-12-23 DIAGNOSIS — Z01818 Encounter for other preprocedural examination: Secondary | ICD-10-CM | POA: Diagnosis not present

## 2023-12-23 DIAGNOSIS — C61 Malignant neoplasm of prostate: Secondary | ICD-10-CM | POA: Diagnosis not present

## 2023-12-23 DIAGNOSIS — N5203 Combined arterial insufficiency and corporo-venous occlusive erectile dysfunction: Secondary | ICD-10-CM | POA: Diagnosis not present

## 2023-12-23 DIAGNOSIS — I10 Essential (primary) hypertension: Secondary | ICD-10-CM | POA: Diagnosis not present

## 2023-12-23 DIAGNOSIS — E785 Hyperlipidemia, unspecified: Secondary | ICD-10-CM | POA: Diagnosis not present

## 2023-12-30 DIAGNOSIS — M6281 Muscle weakness (generalized): Secondary | ICD-10-CM | POA: Diagnosis not present

## 2023-12-30 DIAGNOSIS — M25572 Pain in left ankle and joints of left foot: Secondary | ICD-10-CM | POA: Diagnosis not present

## 2023-12-30 DIAGNOSIS — M25672 Stiffness of left ankle, not elsewhere classified: Secondary | ICD-10-CM | POA: Diagnosis not present

## 2024-01-01 DIAGNOSIS — M6281 Muscle weakness (generalized): Secondary | ICD-10-CM | POA: Diagnosis not present

## 2024-01-01 DIAGNOSIS — M25672 Stiffness of left ankle, not elsewhere classified: Secondary | ICD-10-CM | POA: Diagnosis not present

## 2024-01-01 DIAGNOSIS — M25572 Pain in left ankle and joints of left foot: Secondary | ICD-10-CM | POA: Diagnosis not present

## 2024-01-04 ENCOUNTER — Other Ambulatory Visit: Payer: Self-pay | Admitting: Family Medicine

## 2024-01-04 DIAGNOSIS — M25672 Stiffness of left ankle, not elsewhere classified: Secondary | ICD-10-CM | POA: Diagnosis not present

## 2024-01-04 DIAGNOSIS — M25572 Pain in left ankle and joints of left foot: Secondary | ICD-10-CM | POA: Diagnosis not present

## 2024-01-04 DIAGNOSIS — M6281 Muscle weakness (generalized): Secondary | ICD-10-CM | POA: Diagnosis not present

## 2024-01-04 NOTE — Telephone Encounter (Signed)
 Copied from CRM [id number]. Topic: Clinical - Medication Refill >> Jan 04, 2024  3:17 PM Cammy Copa D wrote: Most Recent Primary Care Visit:  Provider: Kristian Covey  Department: [facility]  Visit Type: OFFICE VISIT  Date: 12/15/2023  Medication: empagliflozin (JARDIANCE) 10 MG TABS tablet  Has the patient contacted their pharmacy? Yes (Agent: If no, request that the patient contact the pharmacy for the refill. If patient does not wish to contact the pharmacy document the reason why and proceed with request.) (Agent: If yes, when and what did the pharmacy advise?)  [facility] called and stated that they originally received the order but was requested by [facility] to transfer the med [facility]. PT called [facility] & asked for [facility] to fill it - [facility] hung up on [facility] and would not transfer Rx back.  Is this the correct pharmacy for this prescription? Yes If no, delete pharmacy and type the correct one.  This is the patient's preferred pharmacy:   [facility] - [place], [place] - [address] [place] [address] Phone: [phone] Fax: [phone]   Has the prescription been filled recently? No  Is the patient out of the medication? Yes  Has the patient been seen for an appointment in the last year OR does the patient have an upcoming appointment? Yes  Can we respond through MyChart? No  Agent: Please be advised that Rx refills may take up to 3 business days. We ask that you follow-up with your pharmacy.

## 2024-01-05 DIAGNOSIS — I129 Hypertensive chronic kidney disease with stage 1 through stage 4 chronic kidney disease, or unspecified chronic kidney disease: Secondary | ICD-10-CM | POA: Diagnosis not present

## 2024-01-05 DIAGNOSIS — C61 Malignant neoplasm of prostate: Secondary | ICD-10-CM | POA: Diagnosis not present

## 2024-01-05 DIAGNOSIS — N2581 Secondary hyperparathyroidism of renal origin: Secondary | ICD-10-CM | POA: Diagnosis not present

## 2024-01-05 DIAGNOSIS — N184 Chronic kidney disease, stage 4 (severe): Secondary | ICD-10-CM | POA: Diagnosis not present

## 2024-01-07 DIAGNOSIS — M25572 Pain in left ankle and joints of left foot: Secondary | ICD-10-CM | POA: Diagnosis not present

## 2024-01-07 DIAGNOSIS — M25672 Stiffness of left ankle, not elsewhere classified: Secondary | ICD-10-CM | POA: Diagnosis not present

## 2024-01-07 DIAGNOSIS — M6281 Muscle weakness (generalized): Secondary | ICD-10-CM | POA: Diagnosis not present

## 2024-01-11 DIAGNOSIS — M25572 Pain in left ankle and joints of left foot: Secondary | ICD-10-CM | POA: Diagnosis not present

## 2024-01-11 DIAGNOSIS — M6281 Muscle weakness (generalized): Secondary | ICD-10-CM | POA: Diagnosis not present

## 2024-01-11 DIAGNOSIS — M25672 Stiffness of left ankle, not elsewhere classified: Secondary | ICD-10-CM | POA: Diagnosis not present

## 2024-01-12 ENCOUNTER — Telehealth: Payer: Self-pay

## 2024-01-12 ENCOUNTER — Telehealth: Payer: Self-pay | Admitting: Family Medicine

## 2024-01-12 ENCOUNTER — Other Ambulatory Visit (HOSPITAL_COMMUNITY): Payer: Self-pay

## 2024-01-12 NOTE — Telephone Encounter (Signed)
 Pharmacy Patient Advocate Encounter   Received notification from Pt Calls Messages that prior authorization for JARDIANCE 10MG  is required/requested.   Insurance verification completed.   The patient is insured through [employer] .   Per test claim: Refill too soon. PA is not needed at this time. Medication was filled 12/16/23. Next eligible fill date is 02/29/24.

## 2024-01-12 NOTE — Telephone Encounter (Signed)
 Patient received a letter stating [facility] needs more information from the Dr. To be able to fill his prescription for Jardiance.  His letter states a new prescription can be sent in.  A copy of the letter is in Dr. Lucie Leather yellow folder in the front office.

## 2024-01-12 NOTE — Telephone Encounter (Signed)
 PER TEST CLAIM: Refill too soon. PA is not needed at this time. Medication was filled 12/16/23. Next eligible fill date is 02/29/24.

## 2024-01-14 DIAGNOSIS — Z8546 Personal history of malignant neoplasm of prostate: Secondary | ICD-10-CM | POA: Diagnosis not present

## 2024-01-14 DIAGNOSIS — I129 Hypertensive chronic kidney disease with stage 1 through stage 4 chronic kidney disease, or unspecified chronic kidney disease: Secondary | ICD-10-CM | POA: Diagnosis not present

## 2024-01-14 DIAGNOSIS — Z79899 Other long term (current) drug therapy: Secondary | ICD-10-CM | POA: Diagnosis not present

## 2024-01-14 DIAGNOSIS — Z7984 Long term (current) use of oral hypoglycemic drugs: Secondary | ICD-10-CM | POA: Diagnosis not present

## 2024-01-14 DIAGNOSIS — Z8673 Personal history of transient ischemic attack (TIA), and cerebral infarction without residual deficits: Secondary | ICD-10-CM | POA: Diagnosis not present

## 2024-01-14 DIAGNOSIS — E1122 Type 2 diabetes mellitus with diabetic chronic kidney disease: Secondary | ICD-10-CM | POA: Diagnosis not present

## 2024-01-14 DIAGNOSIS — N528 Other male erectile dysfunction: Secondary | ICD-10-CM | POA: Diagnosis not present

## 2024-01-14 DIAGNOSIS — N529 Male erectile dysfunction, unspecified: Secondary | ICD-10-CM | POA: Diagnosis not present

## 2024-01-14 DIAGNOSIS — N183 Chronic kidney disease, stage 3 unspecified: Secondary | ICD-10-CM | POA: Diagnosis not present

## 2024-01-14 DIAGNOSIS — Z886 Allergy status to analgesic agent status: Secondary | ICD-10-CM | POA: Diagnosis not present

## 2024-01-15 ENCOUNTER — Telehealth: Payer: Self-pay

## 2024-01-15 ENCOUNTER — Other Ambulatory Visit (HOSPITAL_COMMUNITY): Payer: Self-pay

## 2024-01-15 MED ORDER — DEXCOM G7 SENSOR MISC: 3 refills | Status: DC

## 2024-01-15 NOTE — Telephone Encounter (Signed)
 Copied from CRM [id number]. Topic: Clinical - Medication Question >> Jan 15, 2024  9:11 AM Fredrich Romans wrote: Reason for CRM: [employer] called in stating that patient is requesting a glucose monitor to be  issued to him.   [id number] [place], [place] - [address] AT [facility] OF [address]  Phone: [phone] Fax: [phone]

## 2024-01-15 NOTE — Telephone Encounter (Signed)
 Patient currently is prescribed Dexcom CGM

## 2024-01-15 NOTE — Telephone Encounter (Signed)
 Rx sent

## 2024-01-15 NOTE — Telephone Encounter (Signed)
 Pharmacy Patient Advocate Encounter   Received notification from Onbase that prior authorization for Dexcom G7 Sensor is required/requested.   Insurance verification completed.   The patient is insured through [employer] .   Per test claim: PA required; PA submitted to above mentioned insurance via CoverMyMeds Key/confirmation #/EOC [facility] Status is pending

## 2024-01-18 ENCOUNTER — Other Ambulatory Visit (HOSPITAL_COMMUNITY): Payer: Self-pay

## 2024-01-18 NOTE — Telephone Encounter (Signed)
 Pharmacy Patient Advocate Encounter  Received notification from [employer] that Prior Authorization for Dexcom G7 Sensor has been APPROVED from 10/10/23 to 10/05/24. Ran test claim, Copay is $29.07. This test claim was processed through [facility]- copay amounts may vary at other pharmacies due to pharmacy/plan contracts, or as the patient moves through the different stages of their insurance plan.   PA #/Case ID/Reference #: [id number]

## 2024-01-26 ENCOUNTER — Ambulatory Visit (INDEPENDENT_AMBULATORY_CARE_PROVIDER_SITE_OTHER): Admitting: Family Medicine

## 2024-01-26 ENCOUNTER — Encounter: Payer: Self-pay | Admitting: Family Medicine

## 2024-01-26 ENCOUNTER — Telehealth: Payer: Self-pay

## 2024-01-26 VITALS — BP 118/78 | HR 76 | Temp 97.8°F | Ht 72.0 in | Wt 192.1 lb

## 2024-01-26 DIAGNOSIS — G3184 Mild cognitive impairment, so stated: Secondary | ICD-10-CM | POA: Diagnosis not present

## 2024-01-26 DIAGNOSIS — N184 Chronic kidney disease, stage 4 (severe): Secondary | ICD-10-CM | POA: Diagnosis not present

## 2024-01-26 DIAGNOSIS — I10 Essential (primary) hypertension: Secondary | ICD-10-CM | POA: Diagnosis not present

## 2024-01-26 DIAGNOSIS — N1832 Chronic kidney disease, stage 3b: Secondary | ICD-10-CM

## 2024-01-26 DIAGNOSIS — E1122 Type 2 diabetes mellitus with diabetic chronic kidney disease: Secondary | ICD-10-CM | POA: Diagnosis not present

## 2024-01-26 DIAGNOSIS — Z7984 Long term (current) use of oral hypoglycemic drugs: Secondary | ICD-10-CM

## 2024-01-26 NOTE — Progress Notes (Signed)
 Established Patient Office Visit  Subjective   Patient ID: Jimmy Rice, male    DOB: Jul 11, 1946  Age: 78 y.o. MRN: [id number]  Chief Complaint  Patient presents with   Follow-up   Diabetes    Follow-up for Diabetes, patient is not checking BG, Fainting,     HPI   Jimmy Rice has history of hypertension, type 2 diabetes, osteoarthritis, prostate cancer, chronic kidney disease, BPH.  Recent left Achilles surgery and recovering from that.  He is seen today for the following concerns  Recent near syncopal episode.  This occurred about 2 and half weeks ago.  He went to a job site and was standing and states he felt somewhat "woozy ".  Did not actually lose consciousness.  He went back to his vehicle and ended up sleeping in his vehicle for couple hours.  There was no concern for seizure.  No confusion.  No focal weakness.  No speech changes.  Denies any actual syncopal episodes recently.  He does have type 2 diabetes takes Jardiance  but no drugs that should induce hypoglycemia.  He had echo 2/24 which showed no major valve issues.  Denies any recent chest pains.  He does by recollection state that morning he felt dizzy he did not finish his breakfast and did not have much of anything to drink.  He has not had any dizziness or consistent orthostatic symptoms since then.  He does have hypertension treated with multidrug regimen including losartan  HCTZ, diltiazem , and metoprolol .  Blood pressures have been well-controlled.  Other concern is he expresses concern for cognitive impairment and some recent memory issues.  He been married 17 years and recently has had difficulty even remember his wife's name at times.  He has had occasional difficulty driving places that he states he should be familiar with.  Sometimes struggles with day of week.  Denies any family history of dementia.  No recent head injury.  No headaches.  No focal weakness.  MRI brain 2020 showed no acute abnormalities but  subcentimeter remote right cerebellar infarct.  He also had comment of age-related cerebral atrophy with moderate chronic microvascular ischemic disease at that time.  Other concern is type 2 diabetes with recent prescription for Dexcom meter.  He has had some problems with getting this filled through pharmacy.  Looks like this is finally been resolved and he would like to consider follow-up with clinical pharmacist to go over mechanics of using this.  Past Medical History:  Diagnosis Date   Allergy    SEASONAL   Arthritis    "right knee" (07/22/2016)   BENIGN PROSTATIC HYPERTROPHY, HX OF 04/04/2009   Blood transfusion    Cancer (HCC)    hx of porstate cancer   Cataract    BILATERAL   Childhood asthma    pt denies on proep of 12/10/21   Chronic kidney disease (CKD), stage III (moderate) (HCC)    cysts on kidneys, 3rd stage kidney disease   Complication of anesthesia    woke up "fighting"  confusion   GERD 01/18/2009   HYPERTENSION 01/18/2009   HYPOGONADISM 01/18/2009   Past Surgical History:  Procedure Laterality Date   arterial embolization of prostate gland  07/2023   COLONOSCOPY     2013   INGUINAL HERNIA REPAIR Right 1997   NASAL SINUS SURGERY  1997   PARTIAL KNEE ARTHROPLASTY Right 07/22/2016   Procedure: UNICOMPARTMENTAL KNEE ARTHROPLASTY;  Surgeon: Saundra Curl, MD;  Location: [facility] OR;  Service: Orthopedics;  Laterality: Right;   PARTIAL KNEE ARTHROPLASTY Left 12/17/2021   Procedure: UNICOMPARTMENTAL KNEE;  Surgeon: Saundra Curl, MD;  Location: [facility];  Service: Orthopedics;  Laterality: Left;   PROSTATE BIOPSY  X 5   REPLACEMENT UNICONDYLAR JOINT KNEE Right 07/22/2016   TRANSURETHRAL RESECTION OF PROSTATE  12/2014    reports that he has quit smoking. His smoking use included cigarettes. He has never been exposed to tobacco smoke. He has never used smokeless tobacco. He reports that he does not currently use alcohol after a past usage of about 1.0 standard drink  of alcohol per week. He reports that he does not use drugs. family history includes Bladder Cancer in his brother; Breast cancer in his mother and sister; Kidney disease in his father; Stomach cancer in his paternal aunt. Allergies  Allergen Reactions   Amlodipine  Hives   Flomax [Tamsulosin Hcl] Other (See Comments)    DIFFICULTY BREATHING NASAL STUFFINESS   Tamsulosin Other (See Comments)    DIFFICULTY BREATHING NASAL STUFFINESS     Review of Systems  Constitutional:  Negative for chills, fever and weight loss.  Eyes:  Negative for blurred vision.  Respiratory:  Negative for shortness of breath.   Cardiovascular:  Negative for chest pain.  Gastrointestinal:  Negative for abdominal pain.  Genitourinary:  Negative for dysuria.  Neurological:  Negative for tremors, sensory change, speech change, focal weakness, seizures, loss of consciousness and headaches.      Objective:     BP 118/78 (BP Location: Left Arm, Patient Position: Sitting, Cuff Size: Normal)   Pulse 76   Temp 97.8 F (36.6 C) (Oral)   Ht 6' (1.829 m)   Wt 192 lb 1.6 oz (87.1 kg)   SpO2 97%   BMI 26.05 kg/m  BP Readings from Last 3 Encounters:  01/26/24 118/78  12/15/23 120/72  11/04/23 132/84   Wt Readings from Last 3 Encounters:  01/26/24 192 lb 1.6 oz (87.1 kg)  12/15/23 192 lb 14.4 oz (87.5 kg)  11/04/23 201 lb 8 oz (91.4 kg)      Physical Exam Vitals reviewed.  Constitutional:      General: He is not in acute distress.    Appearance: He is well-developed. He is not ill-appearing.  HENT:     Right Ear: External ear normal.     Left Ear: External ear normal.  Eyes:     Pupils: Pupils are equal, round, and reactive to light.  Neck:     Thyroid : No thyromegaly.  Cardiovascular:     Rate and Rhythm: Normal rate and regular rhythm.  Pulmonary:     Effort: Pulmonary effort is normal. No respiratory distress.     Breath sounds: Normal breath sounds. No wheezing or rales.  Musculoskeletal:      Cervical back: Neck supple.  Neurological:     Mental Status: He is alert and oriented to person, place, and time.     Cranial Nerves: No cranial nerve deficit.     Motor: No weakness.     Coordination: Coordination normal.     Comments: MMSE 26/30      No results found for any visits on 01/26/24.  Last CBC Lab Results  Component Value Date   WBC 4.0 02/07/2022   HGB 13.0 02/07/2022   HCT 40.2 02/07/2022   MCV 93.0 02/07/2022   MCH 30.6 12/17/2021   RDW 13.4 02/07/2022   PLT 186.0 02/07/2022   Last metabolic panel Lab Results  Component Value Date  GLUCOSE 137 (H) 02/07/2022   NA 140 02/07/2022   K 4.0 02/07/2022   CL 101 02/07/2022   CO2 30 02/07/2022   BUN 22 02/07/2022   CREATININE 1.74 (H) 02/07/2022   EGFR 28.0 07/01/2023   CALCIUM  9.6 02/07/2022   PROT 7.3 02/07/2022   ALBUMIN 4.3 02/07/2022   BILITOT 1.2 02/07/2022   ALKPHOS 63 02/07/2022   AST 14 02/07/2022   ALT 15 02/07/2022   ANIONGAP 4 (L) 12/10/2021   Last lipids Lab Results  Component Value Date   CHOL 133 08/21/2023   HDL 43 08/21/2023   LDLCALC 68 08/21/2023   LDLDIRECT 106.0 05/19/2018   TRIG 125 08/21/2023   CHOLHDL 3.1 08/21/2023   Last hemoglobin A1c Lab Results  Component Value Date   HGBA1C 6.5 (A) 11/04/2023   Last thyroid  functions Lab Results  Component Value Date   TSH 4.29 02/07/2022   Last vitamin B12 and Folate Lab Results  Component Value Date   VITAMINB12 865 06/04/2020      The ASCVD Risk score (Arnett DK, et al., 2019) failed to calculate for the following reasons:   Risk score cannot be calculated because patient has a medical history suggesting prior/existing ASCVD    Assessment & Plan:   #1 recent near syncopal episode.  Patient did not actually have syncopal episode at that time 2 and half weeks ago.  He noted symptoms with standing.  Does not take any medicines that should be inducing any hypoglycemia.  Only takes Jardiance  for diabetes.  He does  recall not having anything to drink that morning before going to worksite and suspect he may have had some hypotension.  The symptoms did eventually improve that day later.  No palpitations, chest pain, or other worrisome symptoms.  Blood pressure today 120/78 seated and 110/76 standing.  Stressed importance of good hydration and changing positions slowly.  Follow-up immediately for any recurrent symptoms.  He had echo 2/24 which showed no major valve concerns.  #2 type 2 diabetes with history of good control.  Last A1c 6.4%.  Patient on Jardiance  which was initiated recently.  Recheck A1c at follow-up in 2 months.  Patient recently acquired prescription for Dexcom 7 we will set up referral to our clinical pharmacist to further evaluate and go over mechanics of using continuous glucose monitor  #3 concern for recent cognitive impairment.  Patient scored 26/30 on MMSE.  Has had some recent forgetfulness evening forgetting his wife's name a couple times.  Set up repeat MRI brain to further assess.  Set up neuropsychologist assessment.  Consider repeat MMSE in 3 to 6 months  #4 hypertension stable and well-controlled currently on regimen above.  Return in about 2 months (around 03/27/2024).    Glean Lamy, MD

## 2024-01-26 NOTE — Telephone Encounter (Signed)
 error

## 2024-01-27 ENCOUNTER — Ambulatory Visit (INDEPENDENT_AMBULATORY_CARE_PROVIDER_SITE_OTHER)

## 2024-01-27 ENCOUNTER — Telehealth: Payer: Self-pay

## 2024-01-27 DIAGNOSIS — E1122 Type 2 diabetes mellitus with diabetic chronic kidney disease: Secondary | ICD-10-CM

## 2024-01-27 DIAGNOSIS — M25672 Stiffness of left ankle, not elsewhere classified: Secondary | ICD-10-CM | POA: Diagnosis not present

## 2024-01-27 DIAGNOSIS — N184 Chronic kidney disease, stage 4 (severe): Secondary | ICD-10-CM

## 2024-01-27 DIAGNOSIS — M25572 Pain in left ankle and joints of left foot: Secondary | ICD-10-CM | POA: Diagnosis not present

## 2024-01-27 DIAGNOSIS — M6281 Muscle weakness (generalized): Secondary | ICD-10-CM | POA: Diagnosis not present

## 2024-01-27 MED ORDER — ACCU-CHEK GUIDE ME W/DEVICE KIT: PACK | 0 refills | Status: AC

## 2024-01-27 MED ORDER — ACCU-CHEK SOFTCLIX LANCETS MISC: 3 refills | Status: AC

## 2024-01-27 MED ORDER — ACCU-CHEK GUIDE TEST VI STRP: ORAL_STRIP | 3 refills | Status: AC

## 2024-01-27 NOTE — Progress Notes (Signed)
   01/27/2024  Patient ID: Jimmy Rice, male   DOB: 12-Feb-1946, 78 y.o.   MRN: [id number]  Attempted to contact patient at request of PCP to discuss Dexcom G7 Sensors. Left HIPAA compliant message for patient to return my call at their convenience.   Carnell Christian, PharmD Clinical Pharmacist [phone]

## 2024-01-27 NOTE — Progress Notes (Signed)
   01/27/2024  Patient ID: Jimmy Rice, male   DOB: [birth year], 78 y.o.   MRN: [id number]  Patient presented in office for CGM setup and review. Did not place sensor today as patient reports he has an MRI scheduled in a few days and does not want to waste a sensor.  Patient does request a regular BG meter for pricking his finger in case sensor fails and to have a backup way to check.  Will come back into office on Monday to get first sensor placed and finish setup.  Jimmy Rice, PharmD Clinical Pharmacist [phone]

## 2024-01-29 DIAGNOSIS — M6281 Muscle weakness (generalized): Secondary | ICD-10-CM | POA: Diagnosis not present

## 2024-01-29 DIAGNOSIS — M25672 Stiffness of left ankle, not elsewhere classified: Secondary | ICD-10-CM | POA: Diagnosis not present

## 2024-01-29 DIAGNOSIS — M25572 Pain in left ankle and joints of left foot: Secondary | ICD-10-CM | POA: Diagnosis not present

## 2024-01-30 ENCOUNTER — Ambulatory Visit
Admission: RE | Admit: 2024-01-30 | Discharge: 2024-01-30 | Disposition: A | Discharge status: Home / Self Care | Source: Ambulatory Visit | Attending: Family Medicine | Admitting: Family Medicine

## 2024-01-30 DIAGNOSIS — G3184 Mild cognitive impairment, so stated: Secondary | ICD-10-CM

## 2024-02-01 ENCOUNTER — Ambulatory Visit

## 2024-02-01 DIAGNOSIS — E1122 Type 2 diabetes mellitus with diabetic chronic kidney disease: Secondary | ICD-10-CM

## 2024-02-01 NOTE — Progress Notes (Signed)
   02/01/2024  Patient ID: Jimmy Rice, male   DOB: [birth year], 78 y.o.   MRN: [id number]  Patient presented in office for review and setup of Dexcom G7 CGM sensor/reader device (declines to use app at this time on his phone.  Setup reader and applied sensor. Patient expressed understanding of use.  Patient also brought in [employer] monitor and supplies. Setup and reviewed use, patient expressed understanding.  Reviewed BG goals and how to treat a low.  Follow Up: Patient will reach out with any concerns of use or BG concerns.  Carnell Christian, PharmD Clinical Pharmacist [phone]

## 2024-02-02 DIAGNOSIS — M6281 Muscle weakness (generalized): Secondary | ICD-10-CM | POA: Diagnosis not present

## 2024-02-02 DIAGNOSIS — M25572 Pain in left ankle and joints of left foot: Secondary | ICD-10-CM | POA: Diagnosis not present

## 2024-02-02 DIAGNOSIS — M25672 Stiffness of left ankle, not elsewhere classified: Secondary | ICD-10-CM | POA: Diagnosis not present

## 2024-02-04 ENCOUNTER — Telehealth: Payer: Self-pay | Admitting: Family Medicine

## 2024-02-04 DIAGNOSIS — M6281 Muscle weakness (generalized): Secondary | ICD-10-CM | POA: Diagnosis not present

## 2024-02-04 DIAGNOSIS — M25672 Stiffness of left ankle, not elsewhere classified: Secondary | ICD-10-CM | POA: Diagnosis not present

## 2024-02-04 DIAGNOSIS — M25572 Pain in left ankle and joints of left foot: Secondary | ICD-10-CM | POA: Diagnosis not present

## 2024-02-04 DIAGNOSIS — N400 Enlarged prostate without lower urinary tract symptoms: Secondary | ICD-10-CM | POA: Diagnosis not present

## 2024-02-04 DIAGNOSIS — N429 Disorder of prostate, unspecified: Secondary | ICD-10-CM | POA: Diagnosis not present

## 2024-02-04 DIAGNOSIS — C61 Malignant neoplasm of prostate: Secondary | ICD-10-CM | POA: Diagnosis not present

## 2024-02-04 NOTE — Telephone Encounter (Signed)
 Copied from CRM [id number]. Topic: Referral - Status >> Feb 04, 2024 10:26 AM Rennis Case wrote: Reason for CRM: Jimmy Rice w/ [facility] states they received eferral for patient however they are not accepting external referrals. Referral will be declined.

## 2024-02-08 DIAGNOSIS — M6281 Muscle weakness (generalized): Secondary | ICD-10-CM | POA: Diagnosis not present

## 2024-02-08 DIAGNOSIS — M25572 Pain in left ankle and joints of left foot: Secondary | ICD-10-CM | POA: Diagnosis not present

## 2024-02-08 DIAGNOSIS — S86012D Strain of left Achilles tendon, subsequent encounter: Secondary | ICD-10-CM | POA: Diagnosis not present

## 2024-02-08 DIAGNOSIS — X500XXD Overexertion from strenuous movement or load, subsequent encounter: Secondary | ICD-10-CM | POA: Diagnosis not present

## 2024-02-08 DIAGNOSIS — M25672 Stiffness of left ankle, not elsewhere classified: Secondary | ICD-10-CM | POA: Diagnosis not present

## 2024-02-08 DIAGNOSIS — Z4889 Encounter for other specified surgical aftercare: Secondary | ICD-10-CM | POA: Diagnosis not present

## 2024-02-10 DIAGNOSIS — M25672 Stiffness of left ankle, not elsewhere classified: Secondary | ICD-10-CM | POA: Diagnosis not present

## 2024-02-10 DIAGNOSIS — M6281 Muscle weakness (generalized): Secondary | ICD-10-CM | POA: Diagnosis not present

## 2024-02-10 DIAGNOSIS — M25572 Pain in left ankle and joints of left foot: Secondary | ICD-10-CM | POA: Diagnosis not present

## 2024-02-17 DIAGNOSIS — M25672 Stiffness of left ankle, not elsewhere classified: Secondary | ICD-10-CM | POA: Diagnosis not present

## 2024-02-17 DIAGNOSIS — M25572 Pain in left ankle and joints of left foot: Secondary | ICD-10-CM | POA: Diagnosis not present

## 2024-02-17 DIAGNOSIS — M6281 Muscle weakness (generalized): Secondary | ICD-10-CM | POA: Diagnosis not present

## 2024-02-19 DIAGNOSIS — M25672 Stiffness of left ankle, not elsewhere classified: Secondary | ICD-10-CM | POA: Diagnosis not present

## 2024-02-19 DIAGNOSIS — M25572 Pain in left ankle and joints of left foot: Secondary | ICD-10-CM | POA: Diagnosis not present

## 2024-02-19 DIAGNOSIS — M6281 Muscle weakness (generalized): Secondary | ICD-10-CM | POA: Diagnosis not present

## 2024-02-29 DIAGNOSIS — M25672 Stiffness of left ankle, not elsewhere classified: Secondary | ICD-10-CM | POA: Diagnosis not present

## 2024-02-29 DIAGNOSIS — M6281 Muscle weakness (generalized): Secondary | ICD-10-CM | POA: Diagnosis not present

## 2024-02-29 DIAGNOSIS — M25572 Pain in left ankle and joints of left foot: Secondary | ICD-10-CM | POA: Diagnosis not present

## 2024-03-02 ENCOUNTER — Other Ambulatory Visit: Payer: Self-pay

## 2024-03-02 ENCOUNTER — Other Ambulatory Visit (HOSPITAL_COMMUNITY): Payer: Self-pay

## 2024-03-02 ENCOUNTER — Telehealth: Payer: Self-pay

## 2024-03-02 NOTE — Telephone Encounter (Signed)
 Patient is in need of PA for Dexcom G7

## 2024-03-02 NOTE — Telephone Encounter (Signed)
 Pharmacy Patient Advocate Encounter   Received notification from Pt Calls Messages that prior authorization for Dexcom G7 sensor is required/requested.   Insurance verification completed.   The patient is insured through [employer] .   Per test claim: The current 30 day co-pay is, $29.07.  No PA needed at this time. This test claim was processed through [facility]- copay amounts may vary at other pharmacies due to pharmacy/plan contracts, or as the patient moves through the different stages of their insurance plan.

## 2024-03-03 DIAGNOSIS — M25672 Stiffness of left ankle, not elsewhere classified: Secondary | ICD-10-CM | POA: Diagnosis not present

## 2024-03-03 DIAGNOSIS — M6281 Muscle weakness (generalized): Secondary | ICD-10-CM | POA: Diagnosis not present

## 2024-03-03 DIAGNOSIS — M25572 Pain in left ankle and joints of left foot: Secondary | ICD-10-CM | POA: Diagnosis not present

## 2024-03-04 NOTE — Telephone Encounter (Signed)
 Amir with [employer] informed of the message below

## 2024-03-07 ENCOUNTER — Other Ambulatory Visit: Payer: Self-pay | Admitting: Family Medicine

## 2024-03-07 ENCOUNTER — Telehealth: Payer: Self-pay | Admitting: Family Medicine

## 2024-03-07 ENCOUNTER — Other Ambulatory Visit (HOSPITAL_BASED_OUTPATIENT_CLINIC_OR_DEPARTMENT_OTHER): Payer: Self-pay

## 2024-03-07 DIAGNOSIS — M6281 Muscle weakness (generalized): Secondary | ICD-10-CM | POA: Diagnosis not present

## 2024-03-07 DIAGNOSIS — M25672 Stiffness of left ankle, not elsewhere classified: Secondary | ICD-10-CM | POA: Diagnosis not present

## 2024-03-07 DIAGNOSIS — M25572 Pain in left ankle and joints of left foot: Secondary | ICD-10-CM | POA: Diagnosis not present

## 2024-03-07 MED ORDER — EMPAGLIFLOZIN 10 MG PO TABS: 10.0000 mg | ORAL_TABLET | Freq: Every day | ORAL | 2 refills | Status: DC

## 2024-03-07 NOTE — Telephone Encounter (Signed)
 Rx sent.

## 2024-03-07 NOTE — Telephone Encounter (Signed)
 Last Fill: Atorvastatin : 09/22/23     Hyzaar: 08/25/23       Metoprolol : 08/25/23     Cardizem : 09/16/23  Last OV: 02/01/24 Next OV: 07/06/24 AWV  Routing to provider for review/authorization.

## 2024-03-07 NOTE — Telephone Encounter (Signed)
 Pt states he is trying to get a refill from [facility] for Jardiance , however they say they didn't receive the refill that Dr. Darren Em sent to them.  Pt is asking for the Jardiance  to be resent to [employer].

## 2024-03-07 NOTE — Telephone Encounter (Signed)
 Copied from CRM [id number]. Topic: Clinical - Medication Refill >> Mar 07, 2024  9:44 AM Turkey A wrote: Medication: atorvastatin  (LIPITOR) 20 MG tablet;losartan -hydrochlorothiazide (HYZAAR) 100-12.5 MG tablet; metoprolol  succinate (TOPROL -XL) 50 MG 24 hr tablet;diltiazem  (CARDIZEM  CD) 120 MG 24 hr capsule  Has the patient contacted their pharmacy? Yes (Agent: If no, request that the patient contact the pharmacy for the refill. If patient does not wish to contact the pharmacy document the reason why and proceed with request.) (Agent: If yes, when and what did the pharmacy advise?)  This is the patient's preferred pharmacy:    [facility] - [place], [place] - [address] [place] [address] Phone: [phone] Fax: [phone]  Is this the correct pharmacy for this prescription? Yes If no, delete pharmacy and type the correct one.   Has the prescription been filled recently? No  Is the patient out of the medication? No  Has the patient been seen for an appointment in the last year OR does the patient have an upcoming appointment? Yes  Can we respond through MyChart? Yes  Agent: Please be advised that Rx refills may take up to 3 business days. We ask that you follow-up with your pharmacy.

## 2024-03-09 DIAGNOSIS — M25572 Pain in left ankle and joints of left foot: Secondary | ICD-10-CM | POA: Diagnosis not present

## 2024-03-09 DIAGNOSIS — M25672 Stiffness of left ankle, not elsewhere classified: Secondary | ICD-10-CM | POA: Diagnosis not present

## 2024-03-09 DIAGNOSIS — M6281 Muscle weakness (generalized): Secondary | ICD-10-CM | POA: Diagnosis not present

## 2024-03-14 DIAGNOSIS — M25572 Pain in left ankle and joints of left foot: Secondary | ICD-10-CM | POA: Diagnosis not present

## 2024-03-14 DIAGNOSIS — M25672 Stiffness of left ankle, not elsewhere classified: Secondary | ICD-10-CM | POA: Diagnosis not present

## 2024-03-14 DIAGNOSIS — M6281 Muscle weakness (generalized): Secondary | ICD-10-CM | POA: Diagnosis not present

## 2024-03-15 DIAGNOSIS — M25572 Pain in left ankle and joints of left foot: Secondary | ICD-10-CM | POA: Diagnosis not present

## 2024-03-15 DIAGNOSIS — M6281 Muscle weakness (generalized): Secondary | ICD-10-CM | POA: Diagnosis not present

## 2024-03-15 DIAGNOSIS — M25672 Stiffness of left ankle, not elsewhere classified: Secondary | ICD-10-CM | POA: Diagnosis not present

## 2024-03-28 DIAGNOSIS — X500XXD Overexertion from strenuous movement or load, subsequent encounter: Secondary | ICD-10-CM | POA: Diagnosis not present

## 2024-03-28 DIAGNOSIS — S86012D Strain of left Achilles tendon, subsequent encounter: Secondary | ICD-10-CM | POA: Diagnosis not present

## 2024-03-29 ENCOUNTER — Other Ambulatory Visit: Payer: Self-pay | Admitting: Family Medicine

## 2024-04-06 ENCOUNTER — Encounter: Payer: Self-pay | Admitting: Family Medicine

## 2024-04-06 ENCOUNTER — Ambulatory Visit: Admitting: Family Medicine

## 2024-04-06 VITALS — BP 118/60 | HR 76 | Temp 98.3°F | Wt 192.2 lb

## 2024-04-06 DIAGNOSIS — S86012D Strain of left Achilles tendon, subsequent encounter: Secondary | ICD-10-CM | POA: Diagnosis not present

## 2024-04-06 DIAGNOSIS — E1122 Type 2 diabetes mellitus with diabetic chronic kidney disease: Secondary | ICD-10-CM

## 2024-04-06 DIAGNOSIS — I10 Essential (primary) hypertension: Secondary | ICD-10-CM

## 2024-04-06 DIAGNOSIS — N184 Chronic kidney disease, stage 4 (severe): Secondary | ICD-10-CM

## 2024-04-06 DIAGNOSIS — M25572 Pain in left ankle and joints of left foot: Secondary | ICD-10-CM | POA: Diagnosis not present

## 2024-04-06 DIAGNOSIS — N1832 Chronic kidney disease, stage 3b: Secondary | ICD-10-CM

## 2024-04-06 DIAGNOSIS — Z7984 Long term (current) use of oral hypoglycemic drugs: Secondary | ICD-10-CM

## 2024-04-06 LAB — POCT GLYCOSYLATED HEMOGLOBIN (HGB A1C): Hemoglobin A1C: 6.6 % — AB (ref 4.0–5.6)

## 2024-04-06 MED ORDER — OZEMPIC (0.25 OR 0.5 MG/DOSE) 2 MG/3ML ~~LOC~~ SOPN: 0.2500 mg | PEN_INJECTOR | SUBCUTANEOUS | 1 refills | Status: DC

## 2024-04-06 NOTE — Progress Notes (Signed)
 Established Patient Office Visit  Subjective   Patient ID: Jimmy Rice, male    DOB: [birth year]  Age: 78 y.o. MRN: [id number]  Chief Complaint  Patient presents with   Hyperglycemia    HPI   Jimmy Rice has history of hypertension, GERD, type 2 diabetes, chronic kidney disease, osteoarthritis, prostate cancer.  He recently started continuous glucose monitor.  He states he has been averaging glucose over 200.  He states even fasting his glucose is usually around 200 and frequently around 250 postprandial.  His last A1c was 6.4%.  Does take Jardiance  10 mg daily.  Has previously taken Tresiba  10 units once daily.  No recent major dietary changes.  He is followed by a nephrologist and has follow-up with them actually tomorrow.  Most recent GFR around 38.  Hypertension currently treated with Cardizem  CD 120 mg daily and losartan /HCTZ 100/12.5 mg 1 daily.  Also takes Toprol -XL 50 mg once daily.  Blood pressure recently well-controlled.  Does not take any regular nonsteroidals.  Past Medical History:  Diagnosis Date   Allergy    SEASONAL   Arthritis    right knee (07/22/2016)   BENIGN PROSTATIC HYPERTROPHY, HX OF 04/04/2009   Blood transfusion    Cancer (HCC)    hx of porstate cancer   Cataract    BILATERAL   Childhood asthma    pt denies on proep of 12/10/21   Chronic kidney disease (CKD), stage III (moderate) (HCC)    cysts on kidneys, 3rd stage kidney disease   Complication of anesthesia    woke up fighting  confusion   GERD 01/18/2009   HYPERTENSION 01/18/2009   HYPOGONADISM 01/18/2009   Past Surgical History:  Procedure Laterality Date   arterial embolization of prostate gland  07/2023   COLONOSCOPY     2013   INGUINAL HERNIA REPAIR Right 1997   NASAL SINUS SURGERY  1997   PARTIAL KNEE ARTHROPLASTY Right 07/22/2016   Procedure: UNICOMPARTMENTAL KNEE ARTHROPLASTY;  Surgeon: Evalene JONETTA Chancy, MD;  Location: [facility];  Service: Orthopedics;  Laterality: Right;    PARTIAL KNEE ARTHROPLASTY Left 12/17/2021   Procedure: UNICOMPARTMENTAL KNEE;  Surgeon: Chancy Evalene JONETTA, MD;  Location: [facility];  Service: Orthopedics;  Laterality: Left;   PROSTATE BIOPSY  X 5   REPLACEMENT UNICONDYLAR JOINT KNEE Right 07/22/2016   TRANSURETHRAL RESECTION OF PROSTATE  12/2014    reports that he has quit smoking. His smoking use included cigarettes. He has never been exposed to tobacco smoke. He has never used smokeless tobacco. He reports that he does not currently use alcohol after a past usage of about 1.0 standard drink of alcohol per week. He reports that he does not use drugs. family history includes Bladder Cancer in his brother; Breast cancer in his mother and sister; Kidney disease in his father; Stomach cancer in his paternal aunt. Allergies  Allergen Reactions   Amlodipine  Hives   Flomax [Tamsulosin Hcl] Other (See Comments)    DIFFICULTY BREATHING NASAL STUFFINESS   Tamsulosin Other (See Comments)    DIFFICULTY BREATHING NASAL STUFFINESS     Review of Systems  Eyes:  Negative for blurred vision.  Respiratory:  Negative for shortness of breath.   Cardiovascular:  Negative for chest pain.  Neurological:  Negative for dizziness, weakness and headaches.      Objective:     BP 118/60 (BP Location: Left Arm, Patient Position: Sitting, Cuff Size: Normal)   Pulse 76   Temp 98.3 F (36.8 C) (  Oral)   Wt 192 lb 3.2 oz (87.2 kg)   SpO2 96%   BMI 26.07 kg/m  BP Readings from Last 3 Encounters:  04/06/24 118/60  01/26/24 118/78  12/15/23 120/72   Wt Readings from Last 3 Encounters:  04/06/24 192 lb 3.2 oz (87.2 kg)  01/26/24 192 lb 1.6 oz (87.1 kg)  12/15/23 192 lb 14.4 oz (87.5 kg)      Physical Exam Vitals reviewed.  Constitutional:      General: He is not in acute distress.    Appearance: He is not ill-appearing.  Cardiovascular:     Rate and Rhythm: Normal rate and regular rhythm.  Pulmonary:     Effort: Pulmonary effort is normal.      Breath sounds: Normal breath sounds. No wheezing or rales.  Musculoskeletal:     Right lower leg: No edema.     Left lower leg: No edema.  Neurological:     Mental Status: He is alert.      Results for orders placed or performed in visit on 04/06/24  POC HgB A1c  Result Value Ref Range   Hemoglobin A1C 6.6 (A) 4.0 - 5.6 %   HbA1c POC (<> result, manual entry)     HbA1c, POC (prediabetic range)     HbA1c, POC (controlled diabetic range)      Last CBC Lab Results  Component Value Date   WBC 4.0 02/07/2022   HGB 13.0 02/07/2022   HCT 40.2 02/07/2022   MCV 93.0 02/07/2022   MCH 30.6 12/17/2021   RDW 13.4 02/07/2022   PLT 186.0 02/07/2022   Last metabolic panel Lab Results  Component Value Date   GLUCOSE 137 (H) 02/07/2022   NA 140 02/07/2022   K 4.0 02/07/2022   CL 101 02/07/2022   CO2 30 02/07/2022   BUN 22 02/07/2022   CREATININE 1.74 (H) 02/07/2022   EGFR 28.0 07/01/2023   CALCIUM  9.6 02/07/2022   PROT 7.3 02/07/2022   ALBUMIN 4.3 02/07/2022   BILITOT 1.2 02/07/2022   ALKPHOS 63 02/07/2022   AST 14 02/07/2022   ALT 15 02/07/2022   ANIONGAP 4 (L) 12/10/2021   Last lipids Lab Results  Component Value Date   CHOL 133 08/21/2023   HDL 43 08/21/2023   LDLCALC 68 08/21/2023   LDLDIRECT 106.0 05/19/2018   TRIG 125 08/21/2023   CHOLHDL 3.1 08/21/2023   Last hemoglobin A1c Lab Results  Component Value Date   HGBA1C 6.6 (A) 04/06/2024      The ASCVD Risk score (Arnett DK, et al., 2019) failed to calculate for the following reasons:   Risk score cannot be calculated because patient has a medical history suggesting prior/existing ASCVD    Assessment & Plan:   #1 type 2 diabetes.  A1c today 6.6% which is only slightly worse than previous of 6.4%.  Currently on Jardiance  10 mg daily.  Not a good candidate for metformin with his chronic kidney disease stage IIIb.  We did discuss possible addition of low-dose Ozempic with its known cardiovascular disease  reduction as well as renal protection/reduction in kidney disease progression.  He has no history of pancreatitis and no family history of thyroid  cancer.  We did discuss potential side effects such as nausea.  Will start Ozempic 0.25 mg subcutaneous once weekly and after 1 month if tolerating well increase to 0.5 mg.  Set up 52-month follow-up.  Reassess A1c at that point.  #2 hypertension stable and well-controlled on losartan  HCTZ, metoprolol , and  diltiazem  CD.  Continue current regimen.   Return in about 3 months (around 07/07/2024).    Wolm Scarlet, MD

## 2024-04-06 NOTE — Patient Instructions (Signed)
 Take Ozempic 0.25 mg [place] once weekly for one month and then if tolerating well  increase to 0.5 mg.   Set up 3 month follow up.

## 2024-04-07 DIAGNOSIS — I129 Hypertensive chronic kidney disease with stage 1 through stage 4 chronic kidney disease, or unspecified chronic kidney disease: Secondary | ICD-10-CM | POA: Diagnosis not present

## 2024-04-07 DIAGNOSIS — N184 Chronic kidney disease, stage 4 (severe): Secondary | ICD-10-CM | POA: Diagnosis not present

## 2024-04-07 DIAGNOSIS — N2581 Secondary hyperparathyroidism of renal origin: Secondary | ICD-10-CM | POA: Diagnosis not present

## 2024-04-07 DIAGNOSIS — C61 Malignant neoplasm of prostate: Secondary | ICD-10-CM | POA: Diagnosis not present

## 2024-04-11 DIAGNOSIS — S86012D Strain of left Achilles tendon, subsequent encounter: Secondary | ICD-10-CM | POA: Diagnosis not present

## 2024-04-11 DIAGNOSIS — X500XXD Overexertion from strenuous movement or load, subsequent encounter: Secondary | ICD-10-CM | POA: Diagnosis not present

## 2024-04-15 ENCOUNTER — Telehealth: Payer: Self-pay

## 2024-04-15 NOTE — Progress Notes (Signed)
   04/15/2024  Patient ID: Jimmy Rice Hint, male   DOB: 15-Nov-1945, 78 y.o.   MRN: [id number]  Received notice from front office that patient would like to schedule a follow up with me. Attempted to contact patient to schedule appointment for medication management. Left HIPAA compliant message for patient to return my call at their convenience.   [name], PharmD Clinical Pharmacist [phone]

## 2024-04-15 NOTE — Telephone Encounter (Signed)
 Copied from CRM [id number]. Topic: Clinical - Medication Question >> Apr 15, 2024  9:16 AM Franky GRADE wrote: Reason for RMF:Ejupzwu would like to speak with Clyburn, Moria regarding his Semaglutide ,0.25 or 0.5MG /DOS, (OZEMPIC , 0.25 OR 0.5 MG/DOSE,) 2 MG/3ML SOPN [[id number]] and how to use it.

## 2024-04-18 ENCOUNTER — Ambulatory Visit (INDEPENDENT_AMBULATORY_CARE_PROVIDER_SITE_OTHER)

## 2024-04-18 DIAGNOSIS — N184 Chronic kidney disease, stage 4 (severe): Secondary | ICD-10-CM | POA: Diagnosis not present

## 2024-04-18 DIAGNOSIS — Z7985 Long-term (current) use of injectable non-insulin antidiabetic drugs: Secondary | ICD-10-CM | POA: Diagnosis not present

## 2024-04-18 DIAGNOSIS — E1122 Type 2 diabetes mellitus with diabetic chronic kidney disease: Secondary | ICD-10-CM

## 2024-04-18 NOTE — Telephone Encounter (Signed)
 I spoke with the patient and he reported he will being seeing clinical pharmacist for instructions for injection.

## 2024-04-18 NOTE — Progress Notes (Signed)
   04/18/2024  Patient ID: Jimmy Rice, male   DOB: 1946/09/01, 78 y.o.   MRN: [id number]  Patient presented in office for counseling on how to use Ozempic  injections properly.  Reviewed medication with patient. Patient administered first dose today of 0.25mg  and knows how to use.  Counseled on BG goals and potential side effects of medication.  Follow Up: 4 weeks  [name], PharmD Clinical Pharmacist [phone]

## 2024-05-02 DIAGNOSIS — R29898 Other symptoms and signs involving the musculoskeletal system: Secondary | ICD-10-CM | POA: Diagnosis not present

## 2024-05-02 DIAGNOSIS — Z9889 Other specified postprocedural states: Secondary | ICD-10-CM | POA: Diagnosis not present

## 2024-05-02 DIAGNOSIS — M25372 Other instability, left ankle: Secondary | ICD-10-CM | POA: Diagnosis not present

## 2024-05-02 DIAGNOSIS — S86012A Strain of left Achilles tendon, initial encounter: Secondary | ICD-10-CM | POA: Diagnosis not present

## 2024-05-18 ENCOUNTER — Telehealth: Payer: Self-pay

## 2024-05-18 ENCOUNTER — Other Ambulatory Visit

## 2024-05-18 NOTE — Progress Notes (Signed)
   05/18/2024  Patient ID: Stevens GORMAN Hint, male   DOB: 20-Aug-1946, 78 y.o.   MRN: [id number]  Attempted to contact patient for scheduled appointment for medication management. Left HIPAA compliant message for patient to return my call at their convenience.    [name], PharmD Clinical Pharmacist [phone]

## 2024-05-20 ENCOUNTER — Other Ambulatory Visit: Payer: Self-pay | Admitting: Family Medicine

## 2024-05-25 ENCOUNTER — Ambulatory Visit (INDEPENDENT_AMBULATORY_CARE_PROVIDER_SITE_OTHER)

## 2024-05-25 DIAGNOSIS — N184 Chronic kidney disease, stage 4 (severe): Secondary | ICD-10-CM

## 2024-05-25 DIAGNOSIS — Z7984 Long term (current) use of oral hypoglycemic drugs: Secondary | ICD-10-CM

## 2024-05-25 DIAGNOSIS — Z7985 Long-term (current) use of injectable non-insulin antidiabetic drugs: Secondary | ICD-10-CM | POA: Diagnosis not present

## 2024-05-25 DIAGNOSIS — E1122 Type 2 diabetes mellitus with diabetic chronic kidney disease: Secondary | ICD-10-CM | POA: Diagnosis not present

## 2024-05-25 NOTE — Progress Notes (Signed)
   05/25/2024  Patient ID: Jimmy Rice, male   DOB: 12-03-1945, 78 y.o.   MRN: [id number]  Patient presented in office for follow up on Ozempic .  Subjective/Objective: Has given himself 6 doses of 0.25mg  and denies any issues with side effects. Injects on Mondays Has not been monitoring blood sugars but denies any symptoms of low/high BG. Had stopped using sensors just because but did bring one in office to apply today.  Assessment/Plan: INCREASE Ozempic  to 0.5mg  once weekly as recommended by PCP since tolerating 0.25mg  well after 4+ doses. Keep sensors on and use for sugar monitoring, call us  sooner than scheduled f/u if issues tolerating ozempic  or concerns aries. Counseled on proper storage of ozempic  (lost a pen and placed one in freezer)  Follow Up: 3 weeks

## 2024-06-01 DIAGNOSIS — S86012A Strain of left Achilles tendon, initial encounter: Secondary | ICD-10-CM | POA: Diagnosis not present

## 2024-06-07 ENCOUNTER — Telehealth: Payer: Self-pay

## 2024-06-07 NOTE — Telephone Encounter (Signed)
 Forms faxed and left message informing patient of this

## 2024-06-07 NOTE — Telephone Encounter (Signed)
 Placed in red folder

## 2024-06-07 NOTE — Telephone Encounter (Signed)
 Copied from CRM #[id number]. Topic: General - Other >> Jun 07, 2024  1:05 PM Dedra B wrote: Reason for CRM: Pt is calling to follow up on surgery approval that was sent by Dr. Kit.

## 2024-06-11 ENCOUNTER — Other Ambulatory Visit: Payer: Self-pay | Admitting: Family Medicine

## 2024-06-13 ENCOUNTER — Other Ambulatory Visit: Payer: Self-pay

## 2024-06-13 ENCOUNTER — Telehealth: Payer: Self-pay

## 2024-06-13 ENCOUNTER — Ambulatory Visit

## 2024-06-13 DIAGNOSIS — E1122 Type 2 diabetes mellitus with diabetic chronic kidney disease: Secondary | ICD-10-CM

## 2024-06-13 DIAGNOSIS — N184 Chronic kidney disease, stage 4 (severe): Secondary | ICD-10-CM

## 2024-06-13 DIAGNOSIS — Z7985 Long-term (current) use of injectable non-insulin antidiabetic drugs: Secondary | ICD-10-CM | POA: Diagnosis not present

## 2024-06-13 NOTE — Progress Notes (Deleted)
   06/13/2024  Patient ID: Jimmy Rice, male   DOB: 05/28/1946, 78 y.o.   MRN: [id number]  Patient presented in office for follow up on Ozempic .  Subjective/Objective: Has given himself 6 doses of 0.25mg  and denies any issues with side effects. Injects on Mondays Has not been monitoring blood sugars but denies any symptoms of low/high BG. Had stopped using sensors just because but did bring one in office to apply today.  Assessment/Plan: INCREASE Ozempic  to 0.5mg  once weekly as recommended by PCP since tolerating 0.25mg  well after 4+ doses. Keep sensors on and use for sugar monitoring, call us  sooner than scheduled f/u if issues tolerating ozempic  or concerns aries. Counseled on proper storage of ozempic  (lost a pen and placed one in freezer)  Follow Up: 3 weeks

## 2024-06-13 NOTE — Progress Notes (Signed)
   06/13/2024  Patient ID: Jimmy Rice, male   DOB: 06/15/1946, 77 y.o.   MRN: [id number]  Patient presented in office for follow up on Ozempic .  Subjective/Objective: Just went up to 0.5mg  on ozempic  last Monday (06/06/24) Reports he is tolerating well with no side effects Did not change his sensor when it expired so only has 10 days of data. Summary of data shown below.    Assessment/Plan: -Continue Ozempic  0.5mg  once weekly -Make sure to change sensors every 10 days as instructed -Reminded he needs an eye exam, AWV, and 3 month f/u with PCP next month -Flu shot scheduled. Also due for covid and shingrix, counseled he can get at pharmacy  Follow Up: 3 weeks

## 2024-06-15 ENCOUNTER — Ambulatory Visit (INDEPENDENT_AMBULATORY_CARE_PROVIDER_SITE_OTHER)

## 2024-06-15 ENCOUNTER — Other Ambulatory Visit (HOSPITAL_COMMUNITY): Payer: Self-pay

## 2024-06-15 ENCOUNTER — Telehealth: Payer: Self-pay

## 2024-06-15 DIAGNOSIS — Z23 Encounter for immunization: Secondary | ICD-10-CM | POA: Diagnosis not present

## 2024-06-15 NOTE — Telephone Encounter (Signed)
 Noted

## 2024-06-15 NOTE — Telephone Encounter (Signed)
 Pharmacy Patient Advocate Encounter   Received notification from Pt Calls Messages that prior authorization for Dexcom G7 sensors is required/requested.   Insurance verification completed.   The patient is insured through [employer] .   Per test claim: The current 45 day co-pay is, $29.07.  No PA needed at this time. This test claim was processed through [facility]- copay amounts may vary at other pharmacies due to pharmacy/plan contracts, or as the patient moves through the different stages of their insurance plan.   Current approved prior authorization expires 10/05/24

## 2024-06-21 ENCOUNTER — Encounter: Admitting: Family Medicine

## 2024-06-21 DIAGNOSIS — C61 Malignant neoplasm of prostate: Secondary | ICD-10-CM | POA: Diagnosis not present

## 2024-06-21 DIAGNOSIS — N5203 Combined arterial insufficiency and corporo-venous occlusive erectile dysfunction: Secondary | ICD-10-CM | POA: Diagnosis not present

## 2024-06-22 ENCOUNTER — Telehealth: Payer: Self-pay

## 2024-06-22 MED ORDER — DEXCOM G7 SENSOR MISC: 3 refills | Status: DC

## 2024-06-22 NOTE — Progress Notes (Signed)
   06/22/2024  Patient ID: Jimmy Rice, male   DOB: December 08, 1945, 78 y.o.   MRN: [id number]  Patient called in requesting refill on his Dexcom G7 Sensors. Requests they be sent to [facility]. Sending order.  [name], PharmD Clinical Pharmacist [phone]

## 2024-06-23 ENCOUNTER — Other Ambulatory Visit (HOSPITAL_COMMUNITY): Payer: Self-pay

## 2024-06-27 ENCOUNTER — Ambulatory Visit (INDEPENDENT_AMBULATORY_CARE_PROVIDER_SITE_OTHER)

## 2024-06-27 DIAGNOSIS — N184 Chronic kidney disease, stage 4 (severe): Secondary | ICD-10-CM

## 2024-06-27 DIAGNOSIS — E1122 Type 2 diabetes mellitus with diabetic chronic kidney disease: Secondary | ICD-10-CM

## 2024-06-27 NOTE — Progress Notes (Signed)
   06/27/2024  Patient ID: Jimmy Rice, male   DOB: 11-May-1946, 78 y.o.   MRN: [id number]  Patient presented in office for follow up on Ozempic .  Subjective/Objective: Has given himself 3 doses of 0.5mg  and denies any issues with side effects. Injects on Mondays Was wearing Dexcom sensor but has not had one on in over a week due to the last one malfunctioning. Pulled report on data available. Shows a severe low in the 40s may have occurred but right before sensor stopped working so unclear if this was accurate. Patient did not perform finger prick at that time.  Assessment/Plan: Continue Ozempic  0.5mg  Keep sensors on and use for sugar monitoring, call us  sooner than scheduled f/u if issues tolerating ozempic  or concerns arise with sensors/ Counseled on proper storage of ozempic    Follow Up: 3 weeks   [name], PharmD Clinical Pharmacist [phone]

## 2024-07-01 ENCOUNTER — Encounter (HOSPITAL_BASED_OUTPATIENT_CLINIC_OR_DEPARTMENT_OTHER): Payer: Self-pay | Admitting: Orthopedic Surgery

## 2024-07-04 ENCOUNTER — Other Ambulatory Visit: Payer: Self-pay | Admitting: Family Medicine

## 2024-07-05 DIAGNOSIS — N5203 Combined arterial insufficiency and corporo-venous occlusive erectile dysfunction: Secondary | ICD-10-CM | POA: Diagnosis not present

## 2024-07-06 ENCOUNTER — Ambulatory Visit (INDEPENDENT_AMBULATORY_CARE_PROVIDER_SITE_OTHER): Payer: Medicare Other

## 2024-07-06 ENCOUNTER — Encounter (HOSPITAL_BASED_OUTPATIENT_CLINIC_OR_DEPARTMENT_OTHER)
Admission: RE | Admit: 2024-07-06 | Discharge: 2024-07-06 | Disposition: A | Discharge status: Home / Self Care | Source: Ambulatory Visit | Attending: Orthopedic Surgery | Admitting: Orthopedic Surgery

## 2024-07-06 VITALS — Ht 72.0 in | Wt 182.0 lb

## 2024-07-06 DIAGNOSIS — Z0000 Encounter for general adult medical examination without abnormal findings: Secondary | ICD-10-CM | POA: Diagnosis not present

## 2024-07-06 DIAGNOSIS — S86012A Strain of left Achilles tendon, initial encounter: Secondary | ICD-10-CM | POA: Diagnosis present

## 2024-07-06 DIAGNOSIS — E1122 Type 2 diabetes mellitus with diabetic chronic kidney disease: Secondary | ICD-10-CM | POA: Diagnosis not present

## 2024-07-06 DIAGNOSIS — M6702 Short Achilles tendon (acquired), left ankle: Secondary | ICD-10-CM | POA: Diagnosis not present

## 2024-07-06 DIAGNOSIS — I129 Hypertensive chronic kidney disease with stage 1 through stage 4 chronic kidney disease, or unspecified chronic kidney disease: Secondary | ICD-10-CM | POA: Diagnosis not present

## 2024-07-06 DIAGNOSIS — M1712 Unilateral primary osteoarthritis, left knee: Secondary | ICD-10-CM | POA: Diagnosis not present

## 2024-07-06 DIAGNOSIS — K219 Gastro-esophageal reflux disease without esophagitis: Secondary | ICD-10-CM | POA: Diagnosis not present

## 2024-07-06 DIAGNOSIS — J45909 Unspecified asthma, uncomplicated: Secondary | ICD-10-CM | POA: Diagnosis not present

## 2024-07-06 DIAGNOSIS — N183 Chronic kidney disease, stage 3 unspecified: Secondary | ICD-10-CM | POA: Diagnosis not present

## 2024-07-06 DIAGNOSIS — M66872 Spontaneous rupture of other tendons, left ankle and foot: Secondary | ICD-10-CM | POA: Diagnosis not present

## 2024-07-06 DIAGNOSIS — Z87891 Personal history of nicotine dependence: Secondary | ICD-10-CM | POA: Diagnosis not present

## 2024-07-06 LAB — BASIC METABOLIC PANEL WITH GFR
Anion gap: 12 (ref 5–15)
BUN: 33 mg/dL — ABNORMAL HIGH (ref 8–23)
CO2: 25 mmol/L (ref 22–32)
Calcium: 9.4 mg/dL (ref 8.9–10.3)
Chloride: 100 mmol/L (ref 98–111)
Creatinine, Ser: 2.4 mg/dL — ABNORMAL HIGH (ref 0.61–1.24)
GFR, Estimated: 27 mL/min — ABNORMAL LOW (ref >60)
Glucose, Bld: 113 mg/dL — ABNORMAL HIGH (ref 70–99)
Potassium: 4.2 mmol/L (ref 3.5–5.1)
Sodium: 137 mmol/L (ref 135–145)

## 2024-07-06 NOTE — Progress Notes (Signed)
 Subjective:   Jimmy Rice is a 78 y.o. who presents for a Medicare Wellness preventive visit.  As a reminder, Annual Wellness Visits don't include a physical exam, and some assessments may be limited, especially if this visit is performed virtually. We may recommend an in-person follow-up visit with your provider if needed.  Visit Complete: Virtual I connected with  Jimmy Rice Hint on 07/06/24 by a audio enabled telemedicine application and verified that I am speaking with the correct person using two identifiers.  Patient Location: Home  Provider Location: Home Office  I discussed the limitations of evaluation and management by telemedicine. The patient expressed understanding and agreed to proceed.  Vital Signs: Because this visit was a virtual/telehealth visit, some criteria may be missing or patient reported. Any vitals not documented were not able to be obtained and vitals that have been documented are patient reported.    Persons Participating in Visit: Patient.  AWV Questionnaire: No: Patient Medicare AWV questionnaire was not completed prior to this visit.  Cardiac Risk Factors include: advanced age (>40men, >55 women);male gender;diabetes mellitus;hypertension     Objective:    Today's Vitals   07/06/24 1047  Weight: 182 lb (82.6 kg)  Height: 6' (1.829 m)   Body mass index is 24.68 kg/m.     07/06/2024   10:54 AM 07/03/2023   10:26 AM 06/24/2022   10:13 AM 12/17/2021    6:04 AM 12/10/2021   11:24 AM 08/07/2021   12:00 PM 06/11/2021   10:28 AM  Advanced Directives  Does Patient Have a Medical Advance Directive? Yes Yes Yes Yes Yes No Yes  Type of Estate agent of [place];Living will Healthcare Power of [place];Living will Healthcare Power of [place];Living will Healthcare Power of [place];Living will Healthcare Power of [place];Living will  Living will  Does patient want to make changes to medical advance directive?    No - Patient  declined     Copy of Healthcare Power of Attorney in Chart? 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 Change sensor every 10 days.   diltiazem  (CARDIZEM  CD) 120 MG 24 hr capsule TAKE 1 CAPSULE EVERY DAY   empagliflozin  (JARDIANCE ) 10 MG TABS tablet Take 1 tablet (10 mg total) by mouth daily before breakfast.   glucose blood (ACCU-CHEK GUIDE TEST) test strip Use to check blood sugar once daily Dx E11.22   losartan -hydrochlorothiazide (HYZAAR) 100-12.5 MG tablet TAKE 1 TABLET EVERY DAY   metoprolol  succinate (TOPROL -XL) 50 MG 24 hr tablet TAKE 1 TABLET DAILY WITH OR IMMEDIATELY FOLLOWING A MEAL   Semaglutide ,0.25 or 0.5MG /DOS, (OZEMPIC , 0.25 OR 0.5 MG/DOSE,) 2 MG/3ML SOPN Inject 0.25 mg into the skin once a week. (Patient taking differently: Inject 0.5 mg into the skin once a week.)   No facility-administered encounter medications on file as of 07/06/2024.    Allergies (verified) Amlodipine  and Flomax [tamsulosin hcl]   History: Past Medical History:  Diagnosis Date   Allergy  SEASONAL   Arthritis    right knee (07/22/2016)   BENIGN PROSTATIC HYPERTROPHY, HX OF 04/04/2009   Blood transfusion    Cancer (HCC)    hx of porstate cancer   Cataract    BILATERAL   Childhood asthma    pt denies on proep of 12/10/21   Chronic kidney disease (CKD), stage III (moderate) (HCC)    cysts on kidneys, 3rd stage kidney disease   Complication of anesthesia    woke up fighting  confusion   Diabetes  mellitus without complication (HCC)    GERD 01/18/2009   HYPERTENSION 01/18/2009   HYPOGONADISM 01/18/2009   Past Surgical History:  Procedure Laterality Date   arterial embolization of prostate gland  07/2023   COLONOSCOPY     2013   INGUINAL HERNIA REPAIR Right 1997   NASAL SINUS SURGERY  1997   PARTIAL KNEE ARTHROPLASTY Right 07/22/2016   Procedure: UNICOMPARTMENTAL KNEE ARTHROPLASTY;  Surgeon: Evalene JONETTA Chancy, MD;  Location: [facility] OR;  Service: Orthopedics;  Laterality: Right;   PARTIAL KNEE ARTHROPLASTY Left 12/17/2021   Procedure: UNICOMPARTMENTAL KNEE;  Surgeon: Chancy Evalene JONETTA, MD;  Location: [facility] OR[facility];  Service: Orthopedics;  Laterality: Left;   PROSTATE BIOPSY  X 5   REPLACEMENT UNICONDYLAR JOINT KNEE Right 07/22/2016   TRANSURETHRAL RESECTION OF PROSTATE  12/2014   Family History  Problem Relation Age of Onset   Breast cancer Mother    Kidney disease Father    Breast cancer Sister    Bladder Cancer Brother    Stomach cancer Paternal Aunt    Colon cancer Neg Hx    Esophageal cancer Neg Hx    Rectal cancer Neg Hx    Colon polyps Neg Hx    Crohn's disease Neg Hx    Ulcerative colitis Neg Hx    Social History   Socioeconomic History   Marital status: Married    Spouse name: Not on file   Number of children: Not on file   Years of education: Not on file   Highest education level: Bachelor's degree (e.g., BA, AB, BS)  Occupational History   Not on file  Tobacco Use   Smoking status: Former    Types: Cigarettes    Passive exposure: Never   Smokeless tobacco: Never   Tobacco comments:    quit smoking in the late 1960's; smoked < 1 pack cigarettes TOTAL  Vaping Use   Vaping status: Never Used  Substance and Sexual Activity   Alcohol use: Not Currently    Alcohol/week: 1.0 standard drink of alcohol    Types: 1 Cans of beer per week    Comment: none   Drug use: No   Sexual activity: Yes  Other Topics Concern   Not on file  Social History Narrative    Retired from hazardous waste transportation   Right handed   One story home   Social Drivers of Health   Financial Resource Strain: Low Risk  (07/06/2024)   Overall Financial Resource Strain (CARDIA)    Difficulty of Paying Living Expenses: Not hard at all  Food Insecurity: No Food Insecurity (07/06/2024)   Hunger Vital Sign    Worried About Running Out of Food in the Last Year: Never true    Ran Out of Food in the Last Year: Never true  Transportation Needs: No Transportation Needs (07/06/2024)   PRAPARE - Administrator, Civil Service (Medical): No    Lack of Transportation (Non-Medical): No  Physical Activity: Inactive (  07/06/2024)   Exercise Vital Sign    Days of Exercise per Week: 0 days    Minutes of Exercise per Session: 0 min  Stress: No Stress Concern Present (07/06/2024)   [employer] of Occupational Health - Occupational Stress Questionnaire    Feeling of Stress: Not at all  Social Connections: Moderately Isolated (07/06/2024)   Social Connection and Isolation Panel    Frequency of Communication with Friends and Family: More than three times a week    Frequency of Social Gatherings with Friends and Family: More than three times a week    Attends Religious Services: Never    Database administrator or Organizations: No    Attends Engineer, structural: Never    Marital Status: Married    Tobacco Counseling Counseling given: Not Answered Tobacco comments: quit smoking in the late 1960's; smoked < 1 pack cigarettes TOTAL    Clinical Intake:  Pre-visit preparation completed: Yes  Pain : No/denies pain     BMI - recorded: 24.68 Nutritional Status: BMI of 19-24  Normal Nutritional Risks: None Diabetes: Yes CBG done?: Yes (CBG 176 per patient) CBG resulted in Enter/ Edit results?: Yes Did pt. bring in CBG monitor from home?: No  Lab Results  Component Value Date   HGBA1C 6.6 (A) 04/06/2024   HGBA1C 6.5 (A) 11/04/2023   HGBA1C 6.5 (A)  07/03/2023     How often do you need to have someone help you when you read instructions, pamphlets, or other written materials from your doctor or pharmacy?: 1 - Never  Interpreter Needed?: No  Information entered by :: Rojelio Blush LPN   Activities of Daily Living     07/06/2024   10:53 AM  In your present state of health, do you have any difficulty performing the following activities:  Hearing? 0  Vision? 0  Difficulty concentrating or making decisions? 0  Walking or climbing stairs? 0  Dressing or bathing? 0  Doing errands, shopping? 0  Preparing Food and eating ? N  Using the Toilet? N  In the past six months, have you accidently leaked urine? N  Do you have problems with loss of bowel control? N  Managing your Medications? N  Managing your Finances? N  Housekeeping or managing your Housekeeping? N    Patient Care Team: Micheal Wolm ORN, MD as PCP - General Darron Deatrice LABOR, MD as PCP - Cardiology (Cardiology) Patel, Donika K, DO as Consulting Physician (Neurology)  I have updated your Care Teams any recent Medical Services you may have received from other providers in the past year.     Assessment:   This is a routine wellness examination for Jimmy Rice.  Hearing/Vision screen Hearing Screening - Comments:: Denies hearing difficulties   Vision Screening - Comments:: Wears rx glasses - up to date with [facility] eye exams with  [facility]   Goals Addressed               This Visit's Progress     Stay active (pt-stated)         Depression Screen     07/06/2024   10:52 AM 12/15/2023    8:32 AM 07/03/2023    9:59 AM 01/12/2023    2:01 PM 09/15/2022    3:20 PM 06/24/2022   10:14 AM 12/11/2021    9:20 AM  PHQ 2/9 Scores  PHQ - 2 Score 0 0 0 0 0 0 0  PHQ- 9 Score  0  0  Fall Risk     07/06/2024   10:53 AM 12/15/2023    8:32 AM 07/03/2023   10:25 AM 01/12/2023    2:01 PM 01/08/2023    6:06 PM  Fall Risk   Falls in the past year? 0 0 0 0 0   Number falls in past yr: 0 0 0 0   Injury with Fall? 0 0 0 0   Risk for fall due to : No Fall Risks No Fall Risks No Fall Risks No Fall Risks   Follow up Falls evaluation completed Falls evaluation completed Falls prevention discussed Falls evaluation completed     MEDICARE RISK AT HOME:  Medicare Risk at Home Any stairs in or around the home?: No If so, are there any without handrails?: No Home free of loose throw rugs in walkways, pet beds, electrical cords, etc?: Yes Adequate lighting in your home to reduce risk of falls?: Yes Life alert?: No Use of a cane, walker or w/c?: No Grab bars in the bathroom?: No Shower chair or bench in shower?: Yes Elevated toilet seat or a handicapped toilet?: No  TIMED UP AND GO:  Was the test performed?  No  Cognitive Function: 6CIT completed    05/19/2018    3:31 PM  MMSE - Mini Mental State Exam  Not completed: --        07/06/2024   10:54 AM 07/03/2023   10:26 AM 06/24/2022   10:15 AM 06/11/2021   10:35 AM 06/07/2020   10:40 AM  6CIT Screen  What Year? 0 points 0 points 0 points 0 points 0 points  What month? 0 points 0 points 0 points 0 points 0 points  What time? 0 points 0 points 0 points 0 points 0 points  Count back from 20 0 points 0 points 0 points 0 points 0 points  Months in reverse 0 points 0 points 0 points 0 points 0 points  Repeat phrase 0 points 0 points 0 points 0 points 2 points  Total Score 0 points 0 points 0 points 0 points 2 points    Immunizations Immunization History  Administered Date(s) Administered   Fluad Quad(high Dose 65+) 06/07/2019   INFLUENZA, HIGH DOSE SEASONAL PF 06/18/2011, 07/10/2016, 06/17/2018, 06/26/2020, 06/26/2023, 06/15/2024   Influenza Split 06/17/2018   Influenza, Seasonal, Injecte, Preservative Fre 12/17/2015   Influenza,inj,Quad PF,6+ Mos 06/17/2013   Influenza-Unspecified 07/12/2001, 08/19/2001, 07/06/2005, 08/18/2006, 08/10/2007, 07/25/2008, 08/15/2009, 08/06/2010, 07/15/2012,  05/06/2013, 07/28/2014, 07/07/2015, 07/08/2016, 07/06/2017, 07/07/2019, 06/23/2020, 06/27/2021, 05/29/2022, 06/06/2022, 06/09/2023   Moderna Covid-19 Fall Seasonal Vaccine 94yrs & older 07/04/2022   Moderna Sars-Covid-2 Vaccination 11/27/2019, 12/28/2019, 08/20/2020   Pneumococcal Conjugate-13 08/14/2014, 05/08/2015, 07/15/2017   Pneumococcal Polysaccharide-23 02/10/2011, 07/07/2015   Pneumococcal-Unspecified 08/19/2001   Td 10/06/2005   Tdap 01/04/2010, 05/08/2015, 07/15/2017   Tetanus 01/04/2010   Zoster Recombinant(Shingrix) 05/06/2022   Zoster, Live 05/12/2012    Screening Tests Health Maintenance  Topic Date Due   FOOT EXAM  Never done   Zoster Vaccines- Shingrix (2 of 2) 07/01/2022   OPHTHALMOLOGY EXAM  12/22/2023   COVID-19 Vaccine (5 - 2025-26 season) 06/06/2024   Diabetic kidney evaluation - eGFR measurement  06/30/2024   Diabetic kidney evaluation - Urine ACR  06/30/2024   HEMOGLOBIN A1C  10/07/2024   Medicare Annual Wellness (AWV)  07/06/2025   DTaP/Tdap/Td (6 - Td or Tdap) 07/16/2027   Pneumococcal Vaccine: 50+ Years  Completed   Influenza Vaccine  Completed   Hepatitis C Screening  Completed   HPV VACCINES  Aged Out   Meningococcal B Vaccine  Aged Out   Colonoscopy  Discontinued    Health Maintenance Items Addressed:   Additional Screening:  Vision Screening: Recommended annual ophthalmology exams for early detection of glaucoma and other disorders of the eye. Is the patient up to date with their annual eye exam?  Yes  Who is the provider or what is the name of the office in which the patient attends annual eye exams? [facility]  Dental Screening: Recommended annual dental exams for proper oral hygiene  Community Resource Referral / Chronic Care Management: CRR required this visit?  No   CCM required this visit?  No   Plan:    I have personally reviewed and noted the following in the patient's chart:   Medical and social history Use of  alcohol, tobacco or illicit drugs  Current medications and supplements including opioid prescriptions. Patient is not currently taking opioid prescriptions. Functional ability and status Nutritional status Physical activity Advanced directives List of other physicians Hospitalizations, surgeries, and ER visits in previous 12 months Vitals Screenings to include cognitive, depression, and falls Referrals and appointments  In addition, I have reviewed and discussed with patient certain preventive protocols, quality metrics, and best practice recommendations. A written personalized care plan for preventive services as well as general preventive health recommendations were provided to patient.   Rojelio LELON Blush, LPN   [id number]   After Visit Summary: (MyChart) Due to this being a telephonic visit, the after visit summary with patients personalized plan was offered to patient via MyChart   Notes: Nothing significant to report at this time.

## 2024-07-06 NOTE — Patient Instructions (Addendum)
 Mr. Jimmy Rice,  Thank you for taking the time for your Medicare Wellness Visit. I appreciate your continued commitment to your health goals. Please review the care plan we discussed, and feel free to reach out if I can assist you further.  Medicare recommends these wellness visits once per year to help you and your care team stay ahead of potential health issues. These visits are designed to focus on prevention, allowing your provider to concentrate on managing your acute and chronic conditions during your regular appointments.  Please note that Annual Wellness Visits do not include a physical exam. Some assessments may be limited, especially if the visit was conducted virtually. If needed, we may recommend a separate in-person follow-up with your provider.  Ongoing Care Seeing your primary care provider every 3 to 6 months helps us  monitor your health and provide consistent, personalized care.   Referrals If a referral was made during today's visit and you haven't received any updates within two weeks, please contact the referred provider directly to check on the status.  Recommended Screenings:  Health Maintenance  Topic Date Due   Complete foot exam   Never done   Zoster (Shingles) Vaccine (2 of 2) 07/01/2022   Eye exam for diabetics  12/22/2023   COVID-19 Vaccine (5 - 2025-26 season) 06/06/2024   Yearly kidney function blood test for diabetes  06/30/2024   Yearly kidney health urinalysis for diabetes  06/30/2024   Hemoglobin A1C  10/07/2024   Medicare Annual Wellness Visit  07/06/2025   DTaP/Tdap/Td vaccine (6 - Td or Tdap) 07/16/2027   Pneumococcal Vaccine for age over 25  Completed   Flu Shot  Completed   Hepatitis C Screening  Completed   HPV Vaccine  Aged Out   Meningitis B Vaccine  Aged Out   Colon Cancer Screening  Discontinued       07/06/2024   10:54 AM  Advanced Directives  Does Patient Have a Medical Advance Directive? Yes  Type of Estate agent  of [place];Living will  Copy of Healthcare Power of Attorney in Chart? No - copy requested   Advance Care Planning is important because it: Ensures you receive medical care that aligns with your values, goals, and preferences. Provides guidance to your family and loved ones, reducing the emotional burden of decision-making during critical moments.  Vision: Annual vision screenings are recommended for early detection of glaucoma, cataracts, and diabetic retinopathy. These exams can also reveal signs of chronic conditions such as diabetes and high blood pressure.  Dental: Annual dental screenings help detect early signs of oral cancer, gum disease, and other conditions linked to overall health, including heart disease and diabetes.  Please see the attached documents for additional preventive care recommendations.

## 2024-07-06 NOTE — Progress Notes (Signed)

## 2024-07-07 ENCOUNTER — Encounter (HOSPITAL_BASED_OUTPATIENT_CLINIC_OR_DEPARTMENT_OTHER): Admission: RE | Disposition: A | Payer: Self-pay | Source: Home / Self Care | Attending: Orthopedic Surgery

## 2024-07-07 ENCOUNTER — Encounter (HOSPITAL_BASED_OUTPATIENT_CLINIC_OR_DEPARTMENT_OTHER): Payer: Self-pay | Admitting: Orthopedic Surgery

## 2024-07-07 ENCOUNTER — Ambulatory Visit (HOSPITAL_BASED_OUTPATIENT_CLINIC_OR_DEPARTMENT_OTHER)
Admission: RE | Admit: 2024-07-07 | Discharge: 2024-07-07 | Disposition: A | Discharge status: Home / Self Care | Attending: Orthopedic Surgery | Admitting: Orthopedic Surgery

## 2024-07-07 ENCOUNTER — Ambulatory Visit (HOSPITAL_BASED_OUTPATIENT_CLINIC_OR_DEPARTMENT_OTHER): Admitting: Anesthesiology

## 2024-07-07 ENCOUNTER — Other Ambulatory Visit: Payer: Self-pay

## 2024-07-07 DIAGNOSIS — M1712 Unilateral primary osteoarthritis, left knee: Secondary | ICD-10-CM | POA: Insufficient documentation

## 2024-07-07 DIAGNOSIS — I129 Hypertensive chronic kidney disease with stage 1 through stage 4 chronic kidney disease, or unspecified chronic kidney disease: Secondary | ICD-10-CM | POA: Insufficient documentation

## 2024-07-07 DIAGNOSIS — E119 Type 2 diabetes mellitus without complications: Secondary | ICD-10-CM | POA: Diagnosis not present

## 2024-07-07 DIAGNOSIS — S86012A Strain of left Achilles tendon, initial encounter: Secondary | ICD-10-CM | POA: Diagnosis not present

## 2024-07-07 DIAGNOSIS — E1122 Type 2 diabetes mellitus with diabetic chronic kidney disease: Secondary | ICD-10-CM | POA: Insufficient documentation

## 2024-07-07 DIAGNOSIS — K219 Gastro-esophageal reflux disease without esophagitis: Secondary | ICD-10-CM | POA: Diagnosis not present

## 2024-07-07 DIAGNOSIS — Z01818 Encounter for other preprocedural examination: Secondary | ICD-10-CM

## 2024-07-07 DIAGNOSIS — Z87891 Personal history of nicotine dependence: Secondary | ICD-10-CM | POA: Diagnosis not present

## 2024-07-07 DIAGNOSIS — M6702 Short Achilles tendon (acquired), left ankle: Secondary | ICD-10-CM | POA: Diagnosis not present

## 2024-07-07 DIAGNOSIS — M66872 Spontaneous rupture of other tendons, left ankle and foot: Secondary | ICD-10-CM | POA: Diagnosis not present

## 2024-07-07 DIAGNOSIS — G8918 Other acute postprocedural pain: Secondary | ICD-10-CM | POA: Diagnosis not present

## 2024-07-07 DIAGNOSIS — J45909 Unspecified asthma, uncomplicated: Secondary | ICD-10-CM | POA: Diagnosis not present

## 2024-07-07 DIAGNOSIS — I10 Essential (primary) hypertension: Secondary | ICD-10-CM | POA: Diagnosis not present

## 2024-07-07 DIAGNOSIS — M66862 Spontaneous rupture of other tendons, left lower leg: Secondary | ICD-10-CM | POA: Diagnosis not present

## 2024-07-07 DIAGNOSIS — N183 Chronic kidney disease, stage 3 unspecified: Secondary | ICD-10-CM | POA: Diagnosis not present

## 2024-07-07 HISTORY — PX: ACHILLES TENDON SURGERY: SHX542

## 2024-07-07 HISTORY — PX: GASTROCNEMIUS RECESSION: SHX863

## 2024-07-07 HISTORY — DX: Type 2 diabetes mellitus without complications: E11.9

## 2024-07-07 HISTORY — PX: TENDON TRANSFER: SHX6109

## 2024-07-07 LAB — GLUCOSE, CAPILLARY
Glucose-Capillary: 119 mg/dL — ABNORMAL HIGH (ref 70–99)
Glucose-Capillary: 122 mg/dL — ABNORMAL HIGH (ref 70–99)

## 2024-07-07 SURGERY — RECESSION, MUSCLE, GASTROCNEMIUS
Anesthesia: General | Site: Leg Lower | Laterality: Left

## 2024-07-07 MED ORDER — FENTANYL CITRATE (PF) 100 MCG/2ML IJ SOLN
INTRAMUSCULAR | Status: AC
  Filled 2024-07-07: qty 2

## 2024-07-07 MED ORDER — SUGAMMADEX SODIUM 200 MG/2ML IV SOLN
INTRAVENOUS | Status: DC | PRN
  Administered 2024-07-07: 200 mg via INTRAVENOUS

## 2024-07-07 MED ORDER — ACETAMINOPHEN 10 MG/ML IV SOLN
INTRAVENOUS | Status: DC | PRN
  Administered 2024-07-07: 1000 mg via INTRAVENOUS

## 2024-07-07 MED ORDER — LIDOCAINE 2% (20 MG/ML) 5 ML SYRINGE
INTRAMUSCULAR | Status: AC
  Filled 2024-07-07: qty 5

## 2024-07-07 MED ORDER — SODIUM CHLORIDE 0.9 % IV SOLN: 12.5000 mg | INTRAVENOUS | Status: DC | PRN

## 2024-07-07 MED ORDER — HYDROMORPHONE HCL 1 MG/ML IJ SOLN: 0.2500 mg | INTRAMUSCULAR | Status: DC | PRN

## 2024-07-07 MED ORDER — MIDAZOLAM HCL 2 MG/2ML IJ SOLN
INTRAMUSCULAR | Status: AC
  Filled 2024-07-07: qty 2

## 2024-07-07 MED ORDER — BUPIVACAINE HCL (PF) 0.5 % IJ SOLN
INTRAMUSCULAR | Status: DC | PRN
  Administered 2024-07-07: 20 mL via PERINEURAL

## 2024-07-07 MED ORDER — CEFAZOLIN SODIUM-DEXTROSE 2-4 GM/100ML-% IV SOLN
2.0000 g | INTRAVENOUS | Status: AC
  Administered 2024-07-07: 2 g via INTRAVENOUS

## 2024-07-07 MED ORDER — PROPOFOL 10 MG/ML IV BOLUS
INTRAVENOUS | Status: DC | PRN
  Administered 2024-07-07: 110 mg via INTRAVENOUS
  Administered 2024-07-07: 120 mg via INTRAVENOUS

## 2024-07-07 MED ORDER — SODIUM CHLORIDE 0.9 % IV SOLN
INTRAVENOUS | Status: DC
Start: 2024-07-07 — End: 2024-07-07

## 2024-07-07 MED ORDER — OXYCODONE HCL 5 MG/5ML PO SOLN: 5.0000 mg | Freq: Once | ORAL | Status: DC | PRN

## 2024-07-07 MED ORDER — CEFAZOLIN SODIUM-DEXTROSE 2-4 GM/100ML-% IV SOLN
INTRAVENOUS | Status: AC
  Filled 2024-07-07: qty 100

## 2024-07-07 MED ORDER — LACTATED RINGERS IV SOLN: INTRAVENOUS | Status: DC

## 2024-07-07 MED ORDER — FENTANYL CITRATE (PF) 100 MCG/2ML IJ SOLN
100.0000 ug | Freq: Once | INTRAMUSCULAR | Status: AC
  Administered 2024-07-07: 50 ug via INTRAVENOUS

## 2024-07-07 MED ORDER — EPHEDRINE SULFATE (PRESSORS) 50 MG/ML IJ SOLN
INTRAMUSCULAR | Status: DC | PRN
  Administered 2024-07-07 (×3): 10 mg via INTRAVENOUS

## 2024-07-07 MED ORDER — GLYCOPYRROLATE PF 0.2 MG/ML IJ SOSY
PREFILLED_SYRINGE | INTRAMUSCULAR | Status: DC | PRN
  Administered 2024-07-07: .2 mg via INTRAVENOUS

## 2024-07-07 MED ORDER — EPHEDRINE 5 MG/ML INJ
INTRAVENOUS | Status: AC
  Filled 2024-07-07: qty 5

## 2024-07-07 MED ORDER — LIDOCAINE 2% (20 MG/ML) 5 ML SYRINGE
INTRAMUSCULAR | Status: DC | PRN
  Administered 2024-07-07: 40 mg via INTRAVENOUS

## 2024-07-07 MED ORDER — MIDAZOLAM HCL 2 MG/2ML IJ SOLN
2.0000 mg | Freq: Once | INTRAMUSCULAR | Status: AC
  Administered 2024-07-07: 2 mg via INTRAVENOUS

## 2024-07-07 MED ORDER — PHENYLEPHRINE HCL (PRESSORS) 10 MG/ML IV SOLN
INTRAVENOUS | Status: DC | PRN
  Administered 2024-07-07 (×2): 160 ug via INTRAVENOUS
  Administered 2024-07-07: 80 ug via INTRAVENOUS
  Administered 2024-07-07: 160 ug via INTRAVENOUS

## 2024-07-07 MED ORDER — FENTANYL CITRATE (PF) 100 MCG/2ML IJ SOLN
INTRAMUSCULAR | Status: DC | PRN
  Administered 2024-07-07: 25 ug via INTRAVENOUS

## 2024-07-07 MED ORDER — BUPIVACAINE LIPOSOME 1.3 % IJ SUSP
INTRAMUSCULAR | Status: DC | PRN
  Administered 2024-07-07: 10 mL via PERINEURAL

## 2024-07-07 MED ORDER — AMISULPRIDE (ANTIEMETIC) 5 MG/2ML IV SOLN: 10.0000 mg | Freq: Once | INTRAVENOUS | Status: DC | PRN

## 2024-07-07 MED ORDER — PHENYLEPHRINE HCL-NACL 20-0.9 MG/250ML-% IV SOLN
INTRAVENOUS | Status: DC | PRN
  Administered 2024-07-07: 40 ug/min via INTRAVENOUS

## 2024-07-07 MED ORDER — ONDANSETRON HCL 4 MG/2ML IJ SOLN
INTRAMUSCULAR | Status: AC
  Filled 2024-07-07: qty 2

## 2024-07-07 MED ORDER — OXYCODONE HCL 5 MG PO TABS: 5.0000 mg | ORAL_TABLET | Freq: Once | ORAL | Status: DC | PRN

## 2024-07-07 MED ORDER — ONDANSETRON HCL 4 MG/2ML IJ SOLN
INTRAMUSCULAR | Status: DC | PRN
  Administered 2024-07-07: 4 mg via INTRAVENOUS

## 2024-07-07 MED ORDER — GLYCOPYRROLATE PF 0.2 MG/ML IJ SOSY
PREFILLED_SYRINGE | INTRAMUSCULAR | Status: AC
  Filled 2024-07-07: qty 1

## 2024-07-07 MED ORDER — VANCOMYCIN HCL 500 MG IV SOLR
INTRAVENOUS | Status: DC | PRN
  Administered 2024-07-07: 500 mg via TOPICAL

## 2024-07-07 MED ORDER — OXYCODONE HCL 5 MG PO TABS: 5.0000 mg | ORAL_TABLET | Freq: Four times a day (QID) | ORAL | 0 refills | Status: AC | PRN

## 2024-07-07 MED ORDER — ROCURONIUM BROMIDE 100 MG/10ML IV SOLN
INTRAVENOUS | Status: DC | PRN
  Administered 2024-07-07: 30 mg via INTRAVENOUS

## 2024-07-07 MED ORDER — PHENYLEPHRINE 80 MCG/ML (10ML) SYRINGE FOR IV PUSH (FOR BLOOD PRESSURE SUPPORT)
PREFILLED_SYRINGE | INTRAVENOUS | Status: AC
Start: 2024-07-07 — End: 2024-07-07
  Filled 2024-07-07: qty 10

## 2024-07-07 MED ORDER — LACTATED RINGERS IV SOLN: INTRAVENOUS | Status: DC | PRN

## 2024-07-07 MED ORDER — 0.9 % SODIUM CHLORIDE (POUR BTL) OPTIME
TOPICAL | Status: DC | PRN
  Administered 2024-07-07: 1000 mL

## 2024-07-07 MED ORDER — RIVAROXABAN 10 MG PO TABS: 10.0000 mg | ORAL_TABLET | Freq: Every day | ORAL | 0 refills | Status: DC

## 2024-07-07 MED ORDER — VANCOMYCIN HCL 500 MG IV SOLR
INTRAVENOUS | Status: AC
  Filled 2024-07-07: qty 10

## 2024-07-07 MED ORDER — ROCURONIUM BROMIDE 10 MG/ML (PF) SYRINGE
PREFILLED_SYRINGE | INTRAVENOUS | Status: AC
  Filled 2024-07-07: qty 10

## 2024-07-07 SURGICAL SUPPLY — 74 items
BLADE ARTHRO LOK 4 BEAVER (BLADE) IMPLANT
BLADE AVERAGE 25X9 (BLADE) IMPLANT
BLADE MICRO SAGITTAL (BLADE) IMPLANT
BLADE OSC/SAG .038X5.5 CUT EDG (BLADE) IMPLANT
BLADE SURG 15 STRL LF DISP TIS (BLADE) ×9 IMPLANT
BNDG COMPR ESMARK 6X3 LF (GAUZE/BANDAGES/DRESSINGS) ×3 IMPLANT
BNDG ELASTIC 4INX 5YD STR LF (GAUZE/BANDAGES/DRESSINGS) ×2 IMPLANT
BNDG ELASTIC 6INX 5YD STR LF (GAUZE/BANDAGES/DRESSINGS) ×2 IMPLANT
CANISTER SUCT 1200ML W/VALVE (MISCELLANEOUS) IMPLANT
CHLORAPREP W/TINT 26 (MISCELLANEOUS) ×3 IMPLANT
COVER BACK TABLE 60X90IN (DRAPES) ×2 IMPLANT
CUFF TRNQT CYL 34X4.125X (TOURNIQUET CUFF) ×3 IMPLANT
DRAPE EXTREMITY T 121X128X90 (DISPOSABLE) ×2 IMPLANT
DRAPE OEC MINIVIEW 54X84 (DRAPES) IMPLANT
DRAPE U-SHAPE 47X51 STRL (DRAPES) ×2 IMPLANT
DRSG MEPITEL 4X7.2 (GAUZE/BANDAGES/DRESSINGS) ×3 IMPLANT
ELECTRODE REM PT RTRN 9FT ADLT (ELECTROSURGICAL) ×2 IMPLANT
GAUZE PAD ABD 8X10 STRL (GAUZE/BANDAGES/DRESSINGS) ×6 IMPLANT
GAUZE SPONGE 4X4 12PLY STRL (GAUZE/BANDAGES/DRESSINGS) ×2 IMPLANT
GAUZE STRETCH 2X75IN STRL (MISCELLANEOUS) IMPLANT
GLOVE BIO SURGEON STRL SZ8 (GLOVE) ×2 IMPLANT
GLOVE BIOGEL PI IND STRL 7.0 (GLOVE) IMPLANT
GLOVE BIOGEL PI IND STRL 7.5 (GLOVE) IMPLANT
GLOVE BIOGEL PI IND STRL 8 (GLOVE) ×6 IMPLANT
GLOVE ECLIPSE 8.0 STRL XLNG CF (GLOVE) ×3 IMPLANT
GLOVE SURG SS PI 7.0 STRL IVOR (GLOVE) IMPLANT
GOWN STRL REUS W/ TWL LRG LVL3 (GOWN DISPOSABLE) ×2 IMPLANT
GOWN STRL REUS W/ TWL XL LVL3 (GOWN DISPOSABLE) ×4 IMPLANT
GRAFT TISS 230-320 GRACILIS (Bone Implant) IMPLANT
IMPL TAPESTRY BIOINTEGR 50X40 (Mesh General) IMPLANT
KIT DRILL ACCESSORY 6 (BIT) IMPLANT
NDL HYPO 22X1.5 SAFETY MO (MISCELLANEOUS) IMPLANT
NDL HYPO 25X1 1.5 SAFETY (NEEDLE) IMPLANT
NDL SAFETY ECLIPSE 18X1.5 (NEEDLE) IMPLANT
NDL SUT 6 .5 CRC .975X.05 MAYO (NEEDLE) IMPLANT
NEEDLE HYPO 22X1.5 SAFETY MO (MISCELLANEOUS) IMPLANT
NEEDLE HYPO 25X1 1.5 SAFETY (NEEDLE) IMPLANT
NS IRRIG 1000ML POUR BTL (IV SOLUTION) ×2 IMPLANT
PACK BASIN DAY SURGERY FS (CUSTOM PROCEDURE TRAY) ×2 IMPLANT
PAD CAST 4YDX4 CTTN HI CHSV (CAST SUPPLIES) ×2 IMPLANT
PADDING CAST ABS COTTON 4X4 ST (CAST SUPPLIES) IMPLANT
PADDING CAST COTTON 6X4 STRL (CAST SUPPLIES) ×2 IMPLANT
PASSER SUT SWANSON 36MM LOOP (INSTRUMENTS) IMPLANT
PENCIL SMOKE EVACUATOR (MISCELLANEOUS) ×2 IMPLANT
PIN GUIDE XACTPIN GRAFT PASSNG (PIN) IMPLANT
RETRIEVER SUT HEWSON (MISCELLANEOUS) IMPLANT
SANITIZER HAND ALTRA PUMP 550 (MISCELLANEOUS) ×2 IMPLANT
SCREW QUOTTRO BOLT 7X14 (Screw) IMPLANT
SHEET MEDIUM DRAPE 40X70 STRL (DRAPES) ×3 IMPLANT
SLEEVE SCD COMPRESS KNEE MED (STOCKING) ×3 IMPLANT
SPIKE FLUID TRANSFER (MISCELLANEOUS) IMPLANT
SPLINT PLASTER CAST FAST 5X30 (CAST SUPPLIES) ×40 IMPLANT
SPONGE T-LAP 18X18 ~~LOC~~+RFID (SPONGE) ×2 IMPLANT
STAPLER SKIN PROX WIDE 3.9 (STAPLE) IMPLANT
STOCKINETTE 6 STRL (DRAPES) ×2 IMPLANT
SUCTION TUBE FRAZIER 10FR DISP (SUCTIONS) ×3 IMPLANT
SUT ETHIBOND 2 OS 4 DA (SUTURE) IMPLANT
SUT ETHIBOND 3-0 V-5 (SUTURE) IMPLANT
SUT ETHILON 3 0 PS 1 (SUTURE) ×2 IMPLANT
SUT MERSILENE 2.0 SH NDLE (SUTURE) IMPLANT
SUT MNCRL AB 3-0 PS2 18 (SUTURE) ×2 IMPLANT
SUT VIC AB 0 CT1 27XBRD ANBCTR (SUTURE) IMPLANT
SUT VIC AB 0 SH 27 (SUTURE) IMPLANT
SUT VIC AB 1 CT1 27XBRD ANBCTR (SUTURE) IMPLANT
SUT VIC AB 2-0 SH 18 (SUTURE) IMPLANT
SUT VIC AB 2-0 SH 27XBRD (SUTURE) IMPLANT
SUT VICRYL 0 SH 27 (SUTURE) ×3 IMPLANT
SUTURE FIBERWR #2 38 T-5 BLUE (SUTURE) IMPLANT
SYR BULB EAR ULCER 3OZ GRN STR (SYRINGE) ×2 IMPLANT
SYR CONTROL 10ML LL (SYRINGE) IMPLANT
TOWEL GREEN STERILE FF (TOWEL DISPOSABLE) ×6 IMPLANT
TUBE CONNECTING 20X1/4 (TUBING) ×2 IMPLANT
UNDERPAD 30X36 HEAVY ABSORB (UNDERPADS AND DIAPERS) ×3 IMPLANT
YANKAUER SUCT BULB TIP NO VENT (SUCTIONS) IMPLANT

## 2024-07-07 NOTE — Op Note (Signed)
 07/07/2024  11:10 AM  PATIENT:  Stevens GORMAN Hint  78 y.o. male  PRE-OPERATIVE DIAGNOSIS: 1.  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  Left short Achilles tendon  POST-OPERATIVE DIAGNOSIS: Same  Procedure(s): 1.  Left gastrocnemius recession (separate incision) 2.  Left flexor hallucis longus transfer to the calcaneus 3.  Left Achilles tendon reconstruction with allograft gracilis and Tapestry   SURGEON:  Norleen Armor, MD  ASSISTANT: None  ANESTHESIA:   General, regional  EBL:  minimal   TOURNIQUET: 90 minutes at 350 mmHg thigh  COMPLICATIONS:  None apparent  DISPOSITION:  Extubated, awake and stable to recovery.  INDICATION FOR PROCEDURE: 78 year old male ruptured his left Achilles tendon and underwent surgical treatment by Helene Sitter, MD at [facility].  The patient reruptured through the repair many months ago and has a chronic Achilles tendon rupture.  His gastrocnemius is contracted now creating a short Achilles.  He presents for allograft reconstruction of his left Achilles tendon with transfer of the FHL tendon to the calcaneus and gastrocnemius recession.  The risks and benefits of the alternative treatment options have been discussed in detail.  The patient wishes to proceed with surgery and specifically understands risks of bleeding, infection, nerve damage, blood clots, need for additional surgery, amputation and death.   PROCEDURE IN DETAIL: After preoperative consent was obtained and the correct operative site was identified, the patient was brought to the operating room supine on stretcher.  General anesthesia was induced.  Preoperative antibiotics were administered.  Surgical timeout was taken.  Left lower extremity was exsanguinated and a thigh tourniquet inflated to 350 mmHg.  Patient was then turned in prone position on the operating table with all bony prominences padded well.  The left lower extremity was prepped and draped in standard sterile fashion.   The previous transverse incision was identified.  It was opened again sharply and then extended from the medial and lateral corners exposing the stumps of the Achilles proximally and distally and the intervening scar tissue.  The proximal stump was mobilized.  Care was taken to protect the sural nerve laterally.  The proximal stump was tagged to allow application of traction.  The muscle seem to still be springy and not fibrotic.  Attention was turned to the cath.  An incision was made over the gastrocnemius tendon.  Dissection was carried down through the subcutaneous tissues sharply.  The superficial fascia was incised.  The gastrocnemius tendon was identified.  It was divided medially and laterally under direct vision.  This allowed for excursion of the proximal stump.  The distal stump was then mobilized and all intervening scar tissue excised.  The superior calcaneus just anterior to the Achilles insertion was cleared of all soft tissue.  The deep compartment fascia was incised and released proximally and distally.  The FHL tendon was identified and traced into the tarsal tunnel.  The neurovascular bundle was protected.  The ankle and hallux were maximally plantarflexed.  The FHL tendon was transected at the most distal point within the tarsal tunnel.  A whipstitch was then placed in the end of the tendon.  2 cm was marked with a surgical marker.  A guidepin was then inserted into the calcaneus just anterior to the Achilles.  A 6 mm hole was drilled calcaneus and plantar heel.  A Beath pin was used to pull the FHL down into the tunnel.  A 7 mm Cayenne medical ball was inserted securing the FHL within the tunnel.  Attention was returned to the Achilles tendon stumps.  A gracilis allograft was woven through the ends of the stump and then sutured back on itself using 0 Vicryl figure-of-eight sutures.  Maximal tension was placed on the tendon stump to decrease the gap is much as possible.  A [employer]  tapestry allograft was then wrapped around the gracilis and the sutured in place with 3-0 Monocryl.  The tendon repair site was then oversewn with a running 3-0 Monocryl circumferential Silfverskiold suture.  The wounds were then irrigated copiously and sprinkled with vancomycin  powder.  Subcutaneous tissues were approximated with 2-0 Vicryl.  Skin incisions were closed with running quadrant horizontal mattress sutures of 3-0 nylon.  Sterile dressings were applied followed by a well-padded short leg splint with the ankle in gravity equinus.  The tourniquet was released after application of dressings.  Patient was awakened from anesthesia and transported to the recovery room in stable condition.   FOLLOW UP PLAN: Nonweightbearing on the left lower extremity.  Follow-up in the office in 2 weeks for suture removal and conversion to a short leg cast also in gravity equinus.  Xarelto for DVT prophylaxis due to age.

## 2024-07-07 NOTE — Anesthesia Preprocedure Evaluation (Addendum)
 Anesthesia Evaluation  Patient identified by MRN, date of birth, ID band Patient awake    Reviewed: Allergy & Precautions, H&P , NPO status , Patient's Chart, lab work & pertinent test results  Airway Mallampati: II  TM Distance: >3 FB Neck ROM: Full    Dental  (+) Dental Advisory Given   Pulmonary asthma , former smoker   Pulmonary exam normal breath sounds clear to auscultation       Cardiovascular hypertension, Pt. on medications Normal cardiovascular exam Rhythm:Regular Rate:Normal     Neuro/Psych negative neurological ROS  negative psych ROS   GI/Hepatic Neg liver ROS,GERD  ,,  Endo/Other  diabetes, Type 2    Renal/GU Renal InsufficiencyRenal disease  negative genitourinary   Musculoskeletal  (+) Arthritis , Osteoarthritis,    Abdominal   Peds negative pediatric ROS (+)  Hematology negative hematology ROS (+)   Anesthesia Other Findings   Reproductive/Obstetrics negative OB ROS                              Anesthesia Physical Anesthesia Plan  ASA: 3  Anesthesia Plan: General   Post-op Pain Management: Regional block* and Minimal or no pain anticipated   Induction: Intravenous  PONV Risk Score and Plan: 2 and Ondansetron , Midazolam  and Treatment may vary due to age or medical condition  Airway Management Planned: Oral ETT  Additional Equipment:   Intra-op Plan:   Post-operative Plan: Extubation in OR  Informed Consent: I have reviewed the patients History and Physical, chart, labs and discussed the procedure including the risks, benefits and alternatives for the proposed anesthesia with the patient or authorized representative who has indicated his/her understanding and acceptance.     Dental advisory given  Plan Discussed with: CRNA  Anesthesia Plan Comments:          Anesthesia Quick Evaluation

## 2024-07-07 NOTE — Transfer of Care (Signed)
 Immediate Anesthesia Transfer of Care Note  Patient: Jimmy Rice  Procedure(s) Performed: Left gastrocnemius recession (Left: Leg Lower) Flexor hallucis longus transfer to the calcaneus (Left: Foot) Reconstruction of the achilles tendon (Left: Foot)  Patient Location: PACU  Anesthesia Type:GA combined with regional for post-op pain  Level of Consciousness: drowsy, patient cooperative, and responds to stimulation  Airway & Oxygen Therapy: Patient Spontanous Breathing and Patient connected to face mask oxygen  Post-op Assessment: Report given to RN and Post -op Vital signs reviewed and stable  Post vital signs: Reviewed and stable  Last Vitals:  Vitals Value Taken Time  BP 110/66 07/07/24 11:18  Temp 36.3 C 07/07/24 11:18  Pulse 68 07/07/24 11:23  Resp 14 07/07/24 11:23  SpO2 100 % 07/07/24 11:23  Vitals shown include unfiled device data.  Last Pain:  Vitals:   07/07/24 0736  TempSrc: Temporal  PainSc: 0-No pain      Patients Stated Pain Goal: 3 (07/07/24 0736)  Complications: No notable events documented.

## 2024-07-07 NOTE — Progress Notes (Signed)
Assisted Dr. Miller with left, popliteal, ultrasound guided block. Side rails up, monitors on throughout procedure. See vital signs in flow sheet. Tolerated Procedure well. 

## 2024-07-07 NOTE — Anesthesia Preprocedure Evaluation (Deleted)
 Anesthesia Evaluation  Patient identified by MRN, date of birth, ID band Patient awake    Reviewed: Allergy & Precautions, NPO status , Patient's Chart, lab work & pertinent test results, reviewed documented beta blocker date and time   History of Anesthesia Complications (+) history of anesthetic complications  Airway Mallampati: I  TM Distance: >3 FB Neck ROM: Full    Dental  (+) Upper Dentures, Poor Dentition, Dental Advisory Given   Pulmonary asthma , former smoker   Pulmonary exam normal breath sounds clear to auscultation       Cardiovascular hypertension, Pt. on medications Normal cardiovascular exam Rhythm:Regular Rate:Normal  EKG 09/11/21 NSR, LVH   Neuro/Psych negative neurological ROS  negative psych ROS   GI/Hepatic Neg liver ROS,GERD  Medicated,,  Endo/Other  diabetes    Renal/GU Renal InsufficiencyRenal diseaseBilateral renal cysts   BPH    Musculoskeletal  (+) Arthritis , Osteoarthritis,  OA left knee   Abdominal   Peds  Hematology negative hematology ROS (+)   Anesthesia Other Findings   Reproductive/Obstetrics                                                                Anesthesia Evaluation  Patient identified by MRN, date of birth, ID band Patient awake    Reviewed: Allergy & Precautions, NPO status   History of Anesthesia Complications (+) history of anesthetic complications  Airway Mallampati: I  TM Distance: >3 FB Neck ROM: Full    Dental  (+) Upper Dentures, Poor Dentition   Pulmonary asthma , former smoker,    Pulmonary exam normal breath sounds clear to auscultation       Cardiovascular hypertension, Pt. on medications Normal cardiovascular exam Rhythm:Regular Rate:Normal     Neuro/Psych negative psych ROS   GI/Hepatic negative GI ROS, Neg liver ROS, GERD  Medicated and Controlled,  Endo/Other  negative endocrine ROS   Renal/GU Renal InsufficiencyRenal disease   BPH    Musculoskeletal  (+) Arthritis , OA Right knee    Abdominal   Peds  Hematology negative hematology ROS (+)   Anesthesia Other Findings   Reproductive/Obstetrics                            Lab Results  Component Value Date   INR 1.10 07/11/2016   No results found for: PTT   Chemistry      Component Value Date/Time   NA 137 07/06/2024 1300   NA 137 06/04/2020 0000   K 4.2 07/06/2024 1300   CL 100 07/06/2024 1300   CO2 25 07/06/2024 1300   BUN 33 (H) 07/06/2024 1300   BUN 22 (A) 06/04/2020 0000   CREATININE 2.40 (H) 07/06/2024 1300   CREATININE 2.22 (H) 07/14/2018 1208   GLU 118 06/04/2020 0000      Component Value Date/Time   CALCIUM  9.4 07/06/2024 1300   ALKPHOS 63 02/07/2022 0759   AST 14 02/07/2022 0759   ALT 15 02/07/2022 0759   BILITOT 1.2 02/07/2022 0759     Lab Results  Component Value Date   WBC 4.0 02/07/2022   HGB 13.0 02/07/2022   HCT 40.2 02/07/2022   MCV 93.0 02/07/2022   PLT 186.0 02/07/2022   EKG: normal EKG,  normal sinus rhythm.  Anesthesia Physical Anesthesia Plan  ASA: III  Anesthesia Plan: Spinal   Post-op Pain Management:  Regional for Post-op pain   Induction:   Airway Management Planned: Nasal Cannula and Natural Airway  Additional Equipment:   Intra-op Plan:   Post-operative Plan:   Informed Consent:   Plan Discussed with:   Anesthesia Plan Comments: (Adductor canal block for post op pain management.)      Anesthesia Quick Evaluation  Anesthesia Physical Anesthesia Plan  ASA: 3  Anesthesia Plan: General   Post-op Pain Management: Regional block* and Minimal or no pain anticipated   Induction: Intravenous  PONV Risk Score and Plan: 2 and Treatment may vary due to age or medical condition, Ondansetron  and Midazolam   Airway Management Planned: Oral ETT  Additional Equipment:   Intra-op Plan:   Post-operative Plan:  Extubation in OR  Informed Consent: I have reviewed the patients History and Physical, chart, labs and discussed the procedure including the risks, benefits and alternatives for the proposed anesthesia with the patient or authorized representative who has indicated his/her understanding and acceptance.     Dental advisory given  Plan Discussed with: CRNA and Anesthesiologist  Anesthesia Plan Comments: (See PAT note 12/10/2021)         Anesthesia Quick Evaluation                                   Anesthesia Evaluation  Patient identified by MRN, date of birth, ID band Patient awake    Reviewed: Allergy & Precautions, NPO status   History of Anesthesia Complications (+) history of anesthetic complications  Airway Mallampati: I  TM Distance: >3 FB Neck ROM: Full    Dental  (+) Upper Dentures, Poor Dentition   Pulmonary asthma , former smoker,    Pulmonary exam normal breath sounds clear to auscultation       Cardiovascular hypertension, Pt. on medications Normal cardiovascular exam Rhythm:Regular Rate:Normal     Neuro/Psych negative psych ROS   GI/Hepatic negative GI ROS, Neg liver ROS, GERD  Medicated and Controlled,  Endo/Other  negative endocrine ROS  Renal/GU Renal InsufficiencyRenal disease   BPH    Musculoskeletal  (+) Arthritis , OA Right knee    Abdominal   Peds  Hematology negative hematology ROS (+)   Anesthesia Other Findings   Reproductive/Obstetrics                            Lab Results  Component Value Date   INR 1.10 07/11/2016   No results found for: PTT   Chemistry      Component Value Date/Time   NA 137 07/06/2024 1300   NA 137 06/04/2020 0000   K 4.2 07/06/2024 1300   CL 100 07/06/2024 1300   CO2 25 07/06/2024 1300   BUN 33 (H) 07/06/2024 1300   BUN 22 (A) 06/04/2020 0000   CREATININE 2.40 (H) 07/06/2024 1300   CREATININE 2.22 (H) 07/14/2018 1208   GLU 118 06/04/2020 0000       Component Value Date/Time   CALCIUM  9.4 07/06/2024 1300   ALKPHOS 63 02/07/2022 0759   AST 14 02/07/2022 0759   ALT 15 02/07/2022 0759   BILITOT 1.2 02/07/2022 0759     Lab Results  Component Value Date   WBC 4.0 02/07/2022   HGB 13.0 02/07/2022   HCT 40.2 02/07/2022  MCV 93.0 02/07/2022   PLT 186.0 02/07/2022   EKG: normal EKG, normal sinus rhythm.  Anesthesia Physical Anesthesia Plan  ASA: III  Anesthesia Plan: Spinal   Post-op Pain Management:  Regional for Post-op pain   Induction:   Airway Management Planned: Nasal Cannula and Natural Airway  Additional Equipment:   Intra-op Plan:   Post-operative Plan:   Informed Consent:   Plan Discussed with:   Anesthesia Plan Comments: (Adductor canal block for post op pain management.)      Anesthesia Quick Evaluation                                    Anesthesia Evaluation  Patient identified by MRN, date of birth, ID band Patient awake    Reviewed: Allergy & Precautions, NPO status   History of Anesthesia Complications (+) history of anesthetic complications  Airway Mallampati: I  TM Distance: >3 FB Neck ROM: Full    Dental  (+) Upper Dentures, Poor Dentition   Pulmonary asthma , former smoker,    Pulmonary exam normal breath sounds clear to auscultation       Cardiovascular hypertension, Pt. on medications Normal cardiovascular exam Rhythm:Regular Rate:Normal     Neuro/Psych negative psych ROS   GI/Hepatic negative GI ROS, Neg liver ROS, GERD  Medicated and Controlled,  Endo/Other  negative endocrine ROS  Renal/GU Renal InsufficiencyRenal disease   BPH    Musculoskeletal  (+) Arthritis , OA Right knee    Abdominal   Peds  Hematology negative hematology ROS (+)   Anesthesia Other Findings   Reproductive/Obstetrics                            Lab Results  Component Value Date   INR 1.10 07/11/2016   No results found for: PTT    Chemistry      Component Value Date/Time   NA 137 07/06/2024 1300   NA 137 06/04/2020 0000   K 4.2 07/06/2024 1300   CL 100 07/06/2024 1300   CO2 25 07/06/2024 1300   BUN 33 (H) 07/06/2024 1300   BUN 22 (A) 06/04/2020 0000   CREATININE 2.40 (H) 07/06/2024 1300   CREATININE 2.22 (H) 07/14/2018 1208   GLU 118 06/04/2020 0000      Component Value Date/Time   CALCIUM  9.4 07/06/2024 1300   ALKPHOS 63 02/07/2022 0759   AST 14 02/07/2022 0759   ALT 15 02/07/2022 0759   BILITOT 1.2 02/07/2022 0759     Lab Results  Component Value Date   WBC 4.0 02/07/2022   HGB 13.0 02/07/2022   HCT 40.2 02/07/2022   MCV 93.0 02/07/2022   PLT 186.0 02/07/2022   EKG: normal EKG, normal sinus rhythm.  Anesthesia Physical Anesthesia Plan  ASA: III  Anesthesia Plan: Spinal   Post-op Pain Management:  Regional for Post-op pain   Induction:   Airway Management Planned: Nasal Cannula and Natural Airway  Additional Equipment:   Intra-op Plan:   Post-operative Plan:   Informed Consent:   Plan Discussed with:   Anesthesia Plan Comments: (Adductor canal block for post op pain management.)      Anesthesia Quick Evaluation

## 2024-07-07 NOTE — Anesthesia Procedure Notes (Signed)
 Anesthesia Regional Block: Popliteal block   Pre-Anesthetic Checklist: , timeout performed,  Correct Patient, Correct Site, Correct Laterality,  Correct Procedure, Correct Position, site marked,  Risks and benefits discussed,  Surgical consent,  Pre-op evaluation,  At surgeon's request and post-op pain management  Laterality: Left  Prep: chloraprep       Needles:  Injection technique: Single-shot  Needle Type: Stimiplex     Needle Length: 9cm  Needle Gauge: 21     Additional Needles:   Procedures:,,,, ultrasound used (permanent image in chart),,    Narrative:  Start time: 07/07/2024 8:13 AM End time: 07/07/2024 8:18 AM Injection made incrementally with aspirations every 5 mL.  Performed by: Personally  Anesthesiologist: Cleotilde Butler Dade, MD

## 2024-07-07 NOTE — H&P (Signed)
 Jimmy Rice is an 78 y.o. male.   Chief Complaint: left ankle pain HPI: 78 y/o male with PMH of diabetes and CKD c/o L ankle pain for many months.  He has a chronic rupture of his achilles tendon and has failed non op treatment.  He presents today for surgical treatment.    Past Medical History:  Diagnosis Date   Allergy    SEASONAL   Arthritis    right knee (07/22/2016)   BENIGN PROSTATIC HYPERTROPHY, HX OF 04/04/2009   Blood transfusion    Cancer (HCC)    hx of porstate cancer   Cataract    BILATERAL   Childhood asthma    pt denies on proep of 12/10/21   Chronic kidney disease (CKD), stage III (moderate) (HCC)    cysts on kidneys, 3rd stage kidney disease   Complication of anesthesia    woke up fighting  confusion   Diabetes mellitus without complication (HCC)    GERD 01/18/2009   HYPERTENSION 01/18/2009   HYPOGONADISM 01/18/2009    Past Surgical History:  Procedure Laterality Date   arterial embolization of prostate gland  07/2023   COLONOSCOPY     2013   INGUINAL HERNIA REPAIR Right 1997   NASAL SINUS SURGERY  1997   PARTIAL KNEE ARTHROPLASTY Right 07/22/2016   Procedure: UNICOMPARTMENTAL KNEE ARTHROPLASTY;  Surgeon: Evalene JONETTA Chancy, MD;  Location: [facility] OR;  Service: Orthopedics;  Laterality: Right;   PARTIAL KNEE ARTHROPLASTY Left 12/17/2021   Procedure: UNICOMPARTMENTAL KNEE;  Surgeon: Chancy Evalene JONETTA, MD;  Location: [facility] ORS;  Service: Orthopedics;  Laterality: Left;   PROSTATE BIOPSY  X 5   REPLACEMENT UNICONDYLAR JOINT KNEE Right 07/22/2016   TRANSURETHRAL RESECTION OF PROSTATE  12/2014    Family History  Problem Relation Age of Onset   Breast cancer Mother    Kidney disease Father    Breast cancer Sister    Bladder Cancer Brother    Stomach cancer Paternal Aunt    Colon cancer Neg Hx    Esophageal cancer Neg Hx    Rectal cancer Neg Hx    Colon polyps Neg Hx    Crohn's disease Neg Hx    Ulcerative colitis Neg Hx    Social History:  reports that  he has quit smoking. His smoking use included cigarettes. He has never been exposed to tobacco smoke. He has never used smokeless tobacco. He reports that he does not currently use alcohol after a past usage of about 1.0 standard drink of alcohol per week. He reports that he does not use drugs.  Allergies:  Allergies  Allergen Reactions   Amlodipine  Hives   Flomax [Tamsulosin Hcl] Other (See Comments)    DIFFICULTY BREATHING NASAL STUFFINESS    Medications Prior to Admission  Medication Sig Dispense Refill   diltiazem  (CARDIZEM  CD) 120 MG 24 hr capsule TAKE 1 CAPSULE EVERY DAY 90 capsule 1   empagliflozin  (JARDIANCE ) 10 MG TABS tablet Take 1 tablet (10 mg total) by mouth daily before breakfast. 90 tablet 2   losartan -hydrochlorothiazide (HYZAAR) 100-12.5 MG tablet TAKE 1 TABLET EVERY DAY 90 tablet 1   metoprolol  succinate (TOPROL -XL) 50 MG 24 hr tablet TAKE 1 TABLET DAILY WITH OR IMMEDIATELY FOLLOWING A MEAL 90 tablet 1   Accu-Chek Softclix Lancets lancets Use to check blood sugar once daily Dx: E11.22 100 each 3   atorvastatin  (LIPITOR) 20 MG tablet TAKE 1 TABLET EVERY DAY 30 tablet 0   Blood Glucose Monitoring Suppl (ACCU-CHEK GUIDE  ME) w/Device KIT Use to check blood sugar once daily Dx: E11.22 1 kit 0   Continuous Glucose Sensor (DEXCOM G7 SENSOR) MISC Use as directed to check blood sugar continuously. Change sensor every 10 days. 3 each 3   glucose blood (ACCU-CHEK GUIDE TEST) test strip Use to check blood sugar once daily Dx E11.22 100 each 3   Semaglutide ,0.25 or 0.5MG /DOS, (OZEMPIC , 0.25 OR 0.5 MG/DOSE,) 2 MG/3ML SOPN Inject 0.25 mg into the skin once a week. (Patient taking differently: Inject 0.5 mg into the skin once a week.) 9 mL 1    Results for orders placed or performed during the hospital encounter of 24-Jul-2024 (from the past 48 hours)  Basic metabolic panel per protocol     Status: Abnormal   Collection Time: 07/06/24  1:00 PM  Result Value Ref Range   Sodium 137 135 -  145 mmol/L   Potassium 4.2 3.5 - 5.1 mmol/L   Chloride 100 98 - 111 mmol/L   CO2 25 22 - 32 mmol/L   Glucose, Bld 113 (H) 70 - 99 mg/dL    Comment: Glucose reference range applies only to samples taken after fasting for at least 8 hours.   BUN 33 (H) 8 - 23 mg/dL   Creatinine, Ser 7.59 (H) 0.61 - 1.24 mg/dL   Calcium  9.4 8.9 - 10.3 mg/dL   GFR, Estimated 27 (L) >60 mL/min    Comment: (NOTE) Calculated using the CKD-EPI Creatinine Equation (2021)    Anion gap 12 5 - 15    Comment: Performed at [facility], [address], [place], [place] [address]   No results found.  Review of Systems  no recent f/c/n/v/ wt loss  Height 6' (1.829 m), weight 81.6 kg. Physical Exam  Wn wd male in n ad.  A and O.  EOMI.  Resp unlabored.  L ankle with 4/5 strength in PF.  Skin healthy.  No lymphadenopathy.  Pulses palpable in the left foot.  Assessment/Plan L achilles chronic rupture and Short achilles tendon - to the OR today for gastroc recession, FHL transfer to the calcaneus and achilles tendon reconstruction.  The risks and benefits of the alternative treatment options have been discussed in detail.  The patient wishes to proceed with surgery and specifically understands risks of bleeding, infection, nerve damage, blood clots, need for additional surgery, amputation and death.   Norleen Armor, MD 24-Jul-2024, 7:15 AM

## 2024-07-07 NOTE — Discharge Instructions (Addendum)
 Norleen Armor, MD [facility]  Please read the following information regarding your care after surgery.  Medications  You only need a prescription for the narcotic pain medicine (ex. oxycodone , Percocet, Norco).  All of the other medicines listed below are available over the counter. X Aleve 2 pills twice a day for the first 3 days after surgery. X acetominophen (Tylenol ) 650 mg every 4-6 hours as you need for minor to moderate pain (No Tylenol  until after 3:45pm today) X oxycodone  as prescribed for severe pain  Narcotic pain medicine (ex. oxycodone , Percocet, Vicodin) will cause constipation.  To prevent this problem, take the following medicines while you are taking any pain medicine. X docusate sodium  (Colace) 100 mg twice a day X senna (Senokot) 2 tablets twice a day  X To help prevent blood clots, take Xarelto daily for two weeks after surgery.  Then take a baby aspirin  (81 mg) twice a day for the following month.  You should also get up every hour while you are awake to move around.    Weight Bearing X Do not bear any weight on the operated leg or foot.  Cast / Splint / Dressing X Keep your splint, cast or dressing clean and dry.  Don't put anything (coat hanger, pencil, etc) down inside of it.  If it gets damp, use a hair dryer on the cool setting to dry it.  If it gets soaked, call the office to schedule an appointment for a cast change.  After your dressing, cast or splint is removed; you may shower, but do not soak or scrub the wound.  Allow the water to run over it, and then gently pat it dry.  Swelling It is normal for you to have swelling where you had surgery.  To reduce swelling and pain, keep your toes above your nose for at least 3 days after surgery.  It may be necessary to keep your foot or leg elevated for several weeks.  If it hurts, it should be elevated.  Follow Up Call my office at [phone] when you are discharged from the hospital or surgery center to schedule  an appointment to be seen two weeks after surgery.  Call my office at [phone] if you develop a fever >101.5 F, nausea, vomiting, bleeding from the surgical site or severe pain.     Post Anesthesia Home Care Instructions  Activity: Get plenty of rest for the remainder of the day. A responsible individual must stay with you for 24 hours following the procedure.  For the next 24 hours, DO NOT: -Drive a car -Advertising copywriter -Drink alcoholic beverages -Take any medication unless instructed by your physician -Make any legal decisions or sign important papers.  Meals: Start with liquid foods such as gelatin or soup. Progress to regular foods as tolerated. Avoid greasy, spicy, heavy foods. If nausea and/or vomiting occur, drink only clear liquids until the nausea and/or vomiting subsides. Call your physician if vomiting continues.  Special Instructions/Symptoms: Your throat may feel dry or sore from the anesthesia or the breathing tube placed in your throat during surgery. If this causes discomfort, gargle with warm salt water. The discomfort should disappear within 24 hours.  If you had a scopolamine patch placed behind your ear for the management of post- operative nausea and/or vomiting:  1. The medication in the patch is effective for 72 hours, after which it should be removed.  Wrap patch in a tissue and discard in the trash. Wash hands thoroughly with soap and  water. 2. You may remove the patch earlier than 72 hours if you experience unpleasant side effects which may include dry mouth, dizziness or visual disturbances. 3. Avoid touching the patch. Wash your hands with soap and water after contact with the patch.   Regional Anesthesia Blocks  1. You may not be able to move or feel the blocked extremity after a regional anesthetic block. This may last may last from 3-48 hours after placement, but it will go away. The length of time depends on the medication injected and your  individual response to the medication. As the nerves start to wake up, you may experience tingling as the movement and feeling returns to your extremity. If the numbness and inability to move your extremity has not gone away after 48 hours, please call your surgeon.   2. The extremity that is blocked will need to be protected until the numbness is gone and the strength has returned. Because you cannot feel it, you will need to take extra care to avoid injury. Because it may be weak, you may have difficulty moving it or using it. You may not know what position it is in without looking at it while the block is in effect.  3. For blocks in the legs and feet, returning to weight bearing and walking needs to be done carefully. You will need to wait until the numbness is entirely gone and the strength has returned. You should be able to move your leg and foot normally before you try and bear weight or walk. You will need someone to be with you when you first try to ensure you do not fall and possibly risk injury.  4. Bruising and tenderness at the needle site are common side effects and will resolve in a few days.  5. Persistent numbness or new problems with movement should be communicated to the surgeon or the [facility] [phone] [facility] [phone]).Information for Discharge Teaching: EXPAREL  (bupivacaine  liposome injectable suspension)   Pain relief is important to your recovery. The goal is to control your pain so you can move easier and return to your normal activities as soon as possible after your procedure. Your physician may use several types of medicines to manage pain, swelling, and more.  Your surgeon or anesthesiologist gave you EXPAREL (bupivacaine ) to help control your pain after surgery.  EXPAREL  is a local anesthetic designed to release slowly over an extended period of time to provide pain relief by numbing the tissue around the surgical site. EXPAREL   is designed to release pain medication over time and can control pain for up to 72 hours. Depending on how you respond to EXPAREL , you may require less pain medication during your recovery. EXPAREL  can help reduce or eliminate the need for opioids during the first few days after surgery when pain relief is needed the most. EXPAREL  is not an opioid and is not addictive. It does not cause sleepiness or sedation.   Important! A teal colored band has been placed on your arm with the date, time and amount of EXPAREL  you have received. Please leave this armband in place for the full 96 hours following administration, and then you may remove the band. If you return to the hospital for any reason within 96 hours following the administration of EXPAREL , the armband provides important information that your health care providers to know, and alerts them that you have received this anesthetic.    Possible side effects of EXPAREL : Temporary loss  of sensation or ability to move in the area where medication was injected. Nausea, vomiting, constipation Rarely, numbness and tingling in your mouth or lips, lightheadedness, or anxiety may occur. Call your doctor right away if you think you may be experiencing any of these sensations, or if you have other questions regarding possible side effects.  Follow all other discharge instructions given to you by your surgeon or nurse. Eat a healthy diet and drink plenty of water or other fluids.

## 2024-07-07 NOTE — Anesthesia Postprocedure Evaluation (Signed)
 Anesthesia Post Note  Patient: Jimmy Rice  Procedure(s) Performed: Left gastrocnemius recession (Left: Leg Lower) Flexor hallucis longus transfer to the calcaneus (Left: Foot) Reconstruction of the achilles tendon (Left: Foot)     Patient location during evaluation: PACU Anesthesia Type: General Level of consciousness: awake and alert Pain management: pain level controlled Vital Signs Assessment: post-procedure vital signs reviewed and stable Respiratory status: spontaneous breathing, nonlabored ventilation and respiratory function stable Cardiovascular status: blood pressure returned to baseline and stable Postop Assessment: no apparent nausea or vomiting Anesthetic complications: no   No notable events documented.  Last Vitals:  Vitals:   07/07/24 1145 07/07/24 1200  BP: 112/72 114/78  Pulse: 71 69  Resp: (!) 7 15  Temp:    SpO2: 96% 96%    Last Pain:  Vitals:   07/07/24 1145  TempSrc:   PainSc: 0-No pain                 Butler Levander Pinal

## 2024-07-07 NOTE — Anesthesia Procedure Notes (Signed)
 Procedure Name: Intubation Date/Time: 07/07/2024 9:21 AM  Performed by: Denton Niels CROME, CRNAPre-anesthesia Checklist: Patient identified, Emergency Drugs available, Suction available and Patient being monitored Patient Re-evaluated:Patient Re-evaluated prior to induction Oxygen Delivery Method: Circle system utilized Preoxygenation: Pre-oxygenation with 100% oxygen Induction Type: IV induction Ventilation: Mask ventilation without difficulty Laryngoscope Size: Mac and 3 Grade View: Grade I Tube type: Oral Number of attempts: 1 Airway Equipment and Method: Stylet Placement Confirmation: ETT inserted through vocal cords under direct vision, positive ETCO2 and breath sounds checked- equal and bilateral Secured at: 23 cm Tube secured with: Tape Dental Injury: Teeth and Oropharynx as per pre-operative assessment

## 2024-07-08 ENCOUNTER — Encounter (HOSPITAL_BASED_OUTPATIENT_CLINIC_OR_DEPARTMENT_OTHER): Payer: Self-pay | Admitting: Orthopedic Surgery

## 2024-07-16 DIAGNOSIS — E1122 Type 2 diabetes mellitus with diabetic chronic kidney disease: Secondary | ICD-10-CM | POA: Diagnosis not present

## 2024-07-16 DIAGNOSIS — N2581 Secondary hyperparathyroidism of renal origin: Secondary | ICD-10-CM | POA: Diagnosis not present

## 2024-07-16 DIAGNOSIS — C61 Malignant neoplasm of prostate: Secondary | ICD-10-CM | POA: Diagnosis not present

## 2024-07-16 DIAGNOSIS — E1142 Type 2 diabetes mellitus with diabetic polyneuropathy: Secondary | ICD-10-CM | POA: Diagnosis not present

## 2024-07-16 DIAGNOSIS — E1136 Type 2 diabetes mellitus with diabetic cataract: Secondary | ICD-10-CM | POA: Diagnosis not present

## 2024-07-16 DIAGNOSIS — N184 Chronic kidney disease, stage 4 (severe): Secondary | ICD-10-CM | POA: Diagnosis not present

## 2024-07-16 DIAGNOSIS — K219 Gastro-esophageal reflux disease without esophagitis: Secondary | ICD-10-CM | POA: Diagnosis not present

## 2024-07-16 DIAGNOSIS — D6869 Other thrombophilia: Secondary | ICD-10-CM | POA: Diagnosis not present

## 2024-07-16 DIAGNOSIS — I4891 Unspecified atrial fibrillation: Secondary | ICD-10-CM | POA: Diagnosis not present

## 2024-07-22 DIAGNOSIS — Z4789 Encounter for other orthopedic aftercare: Secondary | ICD-10-CM | POA: Diagnosis not present

## 2024-07-22 DIAGNOSIS — S86012D Strain of left Achilles tendon, subsequent encounter: Secondary | ICD-10-CM | POA: Diagnosis not present

## 2024-07-27 ENCOUNTER — Ambulatory Visit

## 2024-07-27 ENCOUNTER — Other Ambulatory Visit: Payer: Self-pay | Admitting: Family Medicine

## 2024-07-27 DIAGNOSIS — E1122 Type 2 diabetes mellitus with diabetic chronic kidney disease: Secondary | ICD-10-CM

## 2024-07-27 NOTE — Progress Notes (Signed)
   07/27/2024  Patient ID: Stevens GORMAN Hint, male   DOB: [birth year], 78 y.o.   MRN: [id number]  Patient presented in office for follow up on Diabetes.  Subjective/Objective: Had a procedure the first week of October. Reports he has not resumed Ozempic  use as of right now. Would like to see how his sugars do without the medication.   Pulled Dexcom Report:   Only have 10 days of readings as patient has not replaced expired sensor yet. Well-controlled readings with data presented  Assessment/Plan: -Okay to continue to hold Ozempic  for now -Continue other medication therapy -Counseled to replace sensor when it is time so we do not have data gaps and to make sure lows are not recurring  -If sugars begin to elevate, please reach out sooner than scheduled f/u to discuss restarting the Ozempic   Follow Up: 3 weeks   [name], PharmD Clinical Pharmacist [phone]

## 2024-07-30 ENCOUNTER — Other Ambulatory Visit: Payer: Self-pay | Admitting: Family Medicine

## 2024-08-08 DIAGNOSIS — I129 Hypertensive chronic kidney disease with stage 1 through stage 4 chronic kidney disease, or unspecified chronic kidney disease: Secondary | ICD-10-CM | POA: Diagnosis not present

## 2024-08-08 DIAGNOSIS — N2581 Secondary hyperparathyroidism of renal origin: Secondary | ICD-10-CM | POA: Diagnosis not present

## 2024-08-08 DIAGNOSIS — C61 Malignant neoplasm of prostate: Secondary | ICD-10-CM | POA: Diagnosis not present

## 2024-08-08 DIAGNOSIS — I10 Essential (primary) hypertension: Secondary | ICD-10-CM | POA: Diagnosis not present

## 2024-08-08 DIAGNOSIS — N184 Chronic kidney disease, stage 4 (severe): Secondary | ICD-10-CM | POA: Diagnosis not present

## 2024-08-18 DIAGNOSIS — C61 Malignant neoplasm of prostate: Secondary | ICD-10-CM | POA: Diagnosis not present

## 2024-08-20 ENCOUNTER — Other Ambulatory Visit: Payer: Self-pay | Admitting: Family Medicine

## 2024-08-24 ENCOUNTER — Ambulatory Visit

## 2024-08-24 DIAGNOSIS — N184 Chronic kidney disease, stage 4 (severe): Secondary | ICD-10-CM | POA: Diagnosis not present

## 2024-08-24 DIAGNOSIS — E1122 Type 2 diabetes mellitus with diabetic chronic kidney disease: Secondary | ICD-10-CM

## 2024-08-24 NOTE — Progress Notes (Signed)
   08/24/2024  Patient ID: Jimmy Rice, male   DOB: 1945/10/08, 78 y.o.   MRN: [id number]  Patient presented in office for follow up on Diabetes.  Subjective/Objective: Restarted Ozempic  0.5mg  on 08/19/24 after noticing sugars were elevated.  Pulled Dexcom Report:   BG readings have elevated  Assessment/Plan: -Continue Ozempic  0.5mg  once weekly -Continue other medication therapy -Counseled to replace sensor when it is time so we do not have data gaps and to make sure lows are not recurring  -Due for uACR for 2025, sending order to PCP for cosign so patient can get completed when he is in office next month  Follow Up: 4 weeks  Jimmy Rice, PharmD Clinical Pharmacist [phone]

## 2024-09-05 ENCOUNTER — Other Ambulatory Visit: Payer: Self-pay | Admitting: Family Medicine

## 2024-09-12 ENCOUNTER — Ambulatory Visit

## 2024-09-12 ENCOUNTER — Other Ambulatory Visit: Payer: Self-pay | Admitting: Family Medicine

## 2024-09-26 ENCOUNTER — Ambulatory Visit

## 2024-09-27 ENCOUNTER — Encounter: Payer: Self-pay | Admitting: Cardiovascular Disease

## 2024-09-27 ENCOUNTER — Ambulatory Visit: Attending: Cardiovascular Disease | Admitting: Cardiovascular Disease

## 2024-09-27 VITALS — BP 120/70 | HR 73 | Ht 72.0 in | Wt 178.0 lb

## 2024-09-27 DIAGNOSIS — E785 Hyperlipidemia, unspecified: Secondary | ICD-10-CM

## 2024-09-27 DIAGNOSIS — I493 Ventricular premature depolarization: Secondary | ICD-10-CM

## 2024-09-27 DIAGNOSIS — I10 Essential (primary) hypertension: Secondary | ICD-10-CM

## 2024-09-27 NOTE — Patient Instructions (Signed)
 Medication Instructions:  Your physician recommends that you continue on your current medications as directed. Please refer to the Current Medication list given to you today.   *If you need a refill on your cardiac medications before your next appointment, please call your pharmacy*  Lab Work: No labs ordered today  If you have labs (blood work) drawn today and your tests are completely normal, you will receive your results only by: MyChart Message (if you have MyChart) OR A paper copy in the mail If you have any lab test that is abnormal or we need to change your treatment, we will call you to review the results.  Testing/Procedures: No test ordered today   Follow-Up: At [facility], you and your health needs are our priority.  As part of our continuing mission to provide you with exceptional heart care, our providers are all part of one team.  This team includes your primary Cardiologist (physician) and Advanced Practice Providers or APPs (Physician Assistants and Nurse Practitioners) who all work together to provide you with the care you need, when you need it.  Your next appointment:   12 month(s)  Provider:   You may see Deatrice Cage, MD or one of the following Advanced Practice Providers on your designated Care Team:   Lonni Meager, NP Lesley Maffucci, PA-C Bernardino Bring, PA-C Cadence [place], PA-C Tylene Lunch, NP Barnie Hila, NP   We recommend signing up for the patient portal called MyChart.  Sign up information is provided on this After Visit Summary.  MyChart is used to connect with patients for Virtual Visits (Telemedicine).  Patients are able to view lab/test results, encounter notes, upcoming appointments, etc.  Non-urgent messages can be sent to your provider as well.   To learn more about what you can do with MyChart, go to [email or web address].

## 2024-09-27 NOTE — Progress Notes (Signed)
 "    Cardiology Office Note   Date:  09/27/2024   ID:  Jimmy Rice, DOB 10-Jun-1946, MRN [id number]  PCP:  Micheal Wolm ORN, MD  Cardiologist:   Deatrice Cage, MD   Chief Complaint  Patient presents with   12 month follow up     Patient denies any cardiac concerns.        History of Present Illness: Jimmy Rice is a 78 y.o. male who is here today for follow-up visit regarding symptomatic PVCs.  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 Previous cardiac work-up including stress testing and echocardiogram were unremarkable.  PVCs are being treated with Toprol  with excellent control.   He had an echocardiogram done in December 2024 which showed normal LV systolic function with mild mitral regurgitation. He has been doing extremely well with no chest pain, shortness of breath or palpitations.  He takes his medications regularly.  He follows with nephrology for chronic kidney disease.   Past Medical History:  Diagnosis Date   Allergy    SEASONAL   Arthritis    right knee (07/22/2016)   BENIGN PROSTATIC HYPERTROPHY, HX OF 04/04/2009   Blood transfusion    Cancer (HCC)    hx of porstate cancer   Cataract    BILATERAL   Childhood asthma    pt denies on proep of 12/10/21   Chronic kidney disease (CKD), stage III (moderate) (HCC)    cysts on kidneys, 3rd stage kidney disease   Complication of anesthesia    woke up fighting  confusion   Diabetes mellitus without complication (HCC)    GERD 01/18/2009   HYPERTENSION 01/18/2009   HYPOGONADISM 01/18/2009    Past Surgical History:  Procedure Laterality Date   ACHILLES TENDON SURGERY Left 07/07/2024   Procedure: Reconstruction of the achilles tendon;  Surgeon: Kit Rush, MD;  Location: [facility];  Service: Orthopedics;  Laterality: Left;   arterial embolization of prostate gland   07/2023   COLONOSCOPY     2013   GASTROCNEMIUS RECESSION Left 07/07/2024   Procedure: Left gastrocnemius recession;  Surgeon: Kit Rush, MD;  Location: [facility];  Service: Orthopedics;  Laterality: Left;   INGUINAL HERNIA REPAIR Right 1997   NASAL SINUS SURGERY  1997   PARTIAL KNEE ARTHROPLASTY Right 07/22/2016   Procedure: UNICOMPARTMENTAL KNEE ARTHROPLASTY;  Surgeon: Evalene JONETTA Chancy, MD;  Location: [facility];  Service: Orthopedics;  Laterality: Right;   PARTIAL KNEE ARTHROPLASTY Left 12/17/2021   Procedure: UNICOMPARTMENTAL KNEE;  Surgeon: Chancy Evalene JONETTA, MD;  Location: [facility];  Service: Orthopedics;  Laterality: Left;   PROSTATE BIOPSY  X 5   REPLACEMENT UNICONDYLAR JOINT KNEE Right 07/22/2016   TENDON TRANSFER Left 07/07/2024   Procedure: Flexor hallucis longus transfer to the calcaneus;  Surgeon: Kit Rush, MD;  Location: [facility];  Service: Orthopedics;  Laterality: Left;   TRANSURETHRAL RESECTION OF PROSTATE  12/2014     Current Outpatient Medications  Medication Sig Dispense Refill   atorvastatin  (LIPITOR) 20 MG tablet TAKE 1 TABLET EVERY DAY 30 tablet 0   diltiazem  (CARDIZEM  CD) 120 MG 24 hr capsule TAKE 1 CAPSULE EVERY DAY 90 capsule 1   empagliflozin  (JARDIANCE ) 10 MG TABS tablet Take 1 tablet (10 mg total) by mouth daily before breakfast. 90 tablet 2   losartan -hydrochlorothiazide (HYZAAR) 100-12.5 MG tablet TAKE 1 TABLET EVERY DAY 90 tablet  1   metoprolol  succinate (TOPROL -XL) 50 MG 24 hr tablet TAKE 1 TABLET DAILY WITH OR IMMEDIATELY FOLLOWING A MEAL 90 tablet 1   Accu-Chek Softclix Lancets lancets Use to check blood sugar once daily Dx: E11.22 (Patient not taking: Reported on 09/27/2024) 100 each 3   Blood Glucose Monitoring Suppl (ACCU-CHEK GUIDE ME) w/Device KIT Use to check blood sugar once daily Dx: E11.22 (Patient not taking: Reported on 09/27/2024) 1 kit 0   Continuous Glucose Sensor (DEXCOM G7 SENSOR) MISC CHANGE SENSOR EVERY 10  DAYS AS DIRECTED (Patient not taking: Reported on 09/27/2024) 9 each 3   finasteride (PROSCAR) 5 MG tablet Take 5 mg by mouth daily.     glucose blood (ACCU-CHEK GUIDE TEST) test strip Use to check blood sugar once daily Dx E11.22 (Patient not taking: Reported on 09/27/2024) 100 each 3   Semaglutide ,0.25 or 0.5MG /DOS, (OZEMPIC , 0.25 OR 0.5 MG/DOSE,) 2 MG/3ML SOPN Inject 0.25 mg into the skin once a week. (Patient not taking: Reported on 09/27/2024) 9 mL 1   No current facility-administered medications for this visit.    Allergies:   Amlodipine  and Flomax [tamsulosin hcl]    Social History:  The patient  reports that he has quit smoking. His smoking use included cigarettes. He has never been exposed to tobacco smoke. He has never used smokeless tobacco. He reports that he does not currently use alcohol after a past usage of about 1.0 standard drink of alcohol per week. He reports that he does not use drugs.   Family History:  The patient's family history includes Bladder Cancer in his brother; Breast cancer in his mother and sister; Kidney disease in his father; Stomach cancer in his paternal aunt.    ROS:  Please see the history of present illness.   Otherwise, review of systems are positive for none.   All other systems are reviewed and negative.    PHYSICAL EXAM: VS:  BP 120/70 (BP Location: Left Arm, Patient Position: Sitting, Cuff Size: Normal)   Pulse 73   Ht 6' (1.829 m)   Wt 178 lb (80.7 kg)   SpO2 98%   BMI 24.14 kg/m  , BMI Body mass index is 24.14 kg/m. GEN: Well nourished, well developed, in no acute distress  HEENT: normal  Neck: no JVD, carotid bruits, or masses Cardiac: RRR; no murmurs, rubs, or gallops,no edema  Respiratory:  clear to auscultation bilaterally, normal work of breathing GI: soft, nontender, nondistended, + BS MS: no deformity or atrophy  Skin: warm and dry, no rash Neuro:  Strength and sensation are intact Psych: euthymic mood, full affect   EKG:   EKG is ordered today. The ekg ordered today demonstrates: Normal sinus rhythm Normal ECG When compared with ECG of 21-Aug-2023 11:04, No significant change was found    Recent Labs: 07/06/2024: BUN 33; Creatinine, Ser 2.40; Potassium 4.2; Sodium 137    Lipid Panel    Component Value Date/Time   CHOL 133 08/21/2023 1135   TRIG 125 08/21/2023 1135   HDL 43 08/21/2023 1135   CHOLHDL 3.1 08/21/2023 1135   CHOLHDL 3 02/07/2022 0759   VLDL 25.2 02/07/2022 0759   LDLCALC 68 08/21/2023 1135   LDLDIRECT 106.0 05/19/2018 1510      Wt Readings from Last 3 Encounters:  09/27/24 178 lb (80.7 kg)  07/07/24 187 lb 6.3 oz (85 kg)  07/06/24 182 lb (82.6 kg)        07/14/2019    8:29 AM  PAD Screen  Previous PAD dx? No  Previous surgical procedure? No  Pain with walking? No  Feet/toe relief with dangling? No  Painful, non-healing ulcers? No  Extremities discolored? No      ASSESSMENT AND PLAN:  1.  Symptomatic PVCs: Symptoms are  well controlled on Toprol  and diltiazem .  No evidence of PVCs by physical exam or EKG.  2.  Essential hypertension: Blood pressure is reasonably controlled on current medications.  3.  Hyperlipidemia: Currently on atorvastatin  20 mg daily.  I reviewed most recent lipid profile which showed an LDL of 68.  4.  Chronic kidney disease: This has been stable and followed by nephrology.    Disposition:   FU with me in 12 months  Signed,  Deatrice Cage, MD  09/27/2024 2:16 PM    [facility] "

## 2024-10-04 ENCOUNTER — Telehealth: Payer: Self-pay

## 2024-10-04 ENCOUNTER — Telehealth: Payer: Self-pay | Admitting: *Deleted

## 2024-10-04 MED ORDER — LOSARTAN POTASSIUM-HCTZ 100-12.5 MG PO TABS: 1.0000 | ORAL_TABLET | Freq: Every day | ORAL | 0 refills | Status: AC

## 2024-10-04 MED ORDER — LOSARTAN POTASSIUM-HCTZ 100-12.5 MG PO TABS: 1.0000 | ORAL_TABLET | Freq: Every day | ORAL | 1 refills | Status: DC

## 2024-10-04 NOTE — Telephone Encounter (Signed)
 Copied from CRM #[id number]. Topic: Clinical - Prescription Issue >> Oct 04, 2024 11:51 AM Tinnie BROCKS wrote: Reason for CRM: Pt asked for a fill of only 6 tablets of the losartan  to the pharmacy specified this morning and received rx for 90 days. The pharmacy said they would be unable to fill this. Pt says he does not care if he needs to pay out of pocket, but that he only wants 6 pills, not 90. He wants to know if the rx can be resent as only 6 so he can pick it up. Please call pt once complete [phone]

## 2024-10-04 NOTE — Telephone Encounter (Signed)
 Copied from CRM #[id number]. Topic: Clinical - Prescription Issue >> Oct 04, 2024 10:37 AM Eva FALCON wrote: Reason for CRM: Pt is currently in [place] traveling, he realized he left his Losartan  Medication at home. He says [facility] is willing go give him 6 pills to last him for his trip they just need a prescription to be sent to their pharmacy. It is the [facility] on [address] [place], in [place], [place] I have added the pharmacy on his chart. Please call [phone] when this has been done or if any other questions or concerns.

## 2024-10-04 NOTE — Telephone Encounter (Signed)
"  Rx sent  "

## 2024-10-04 NOTE — Telephone Encounter (Signed)
"  Rx  done   "

## 2024-10-13 ENCOUNTER — Other Ambulatory Visit: Payer: Self-pay | Admitting: Family Medicine

## 2024-10-17 ENCOUNTER — Ambulatory Visit

## 2024-10-17 DIAGNOSIS — E1122 Type 2 diabetes mellitus with diabetic chronic kidney disease: Secondary | ICD-10-CM

## 2024-10-17 NOTE — Progress Notes (Signed)
" ° °  10/17/2024  Patient ID: Jimmy Rice Hint, male   DOB: [birth year], 79 y.o.   MRN: [id number]  Patient presented in office for follow up on Diabetes.  Subjective/Objective: Has not given himself Ozempic  since mid to end November.  Got concerned with weight loss, states he got down to 153 lbs in November while using, Now back up to 170s since stopping  Unable to pull full dexcom report today but did bring receiver with him that showed the following:  14 day GMI: 6.8 Avg BG 148 Time In Range: 81% Did not have sensor on today, has to change when he gets home   Assessment/Plan: -Continue other medication therapy, ok to continue to hold Ozempic  due to weight loss concerns  -Counseled to replace sensor when it is time so we do not have data gaps and to make sure lows are not recurring  -Reminded of PCP f/u on 11/04/24  Follow Up: 6 weeks [name], PharmD Clinical Pharmacist [phone]  "

## 2024-10-18 ENCOUNTER — Ambulatory Visit: Admitting: Family Medicine

## 2024-11-01 ENCOUNTER — Other Ambulatory Visit: Payer: Self-pay | Admitting: Family Medicine

## 2024-11-01 MED ORDER — EMPAGLIFLOZIN 10 MG PO TABS: 10.0000 mg | ORAL_TABLET | Freq: Every day | ORAL | 3 refills | Status: AC

## 2024-11-04 ENCOUNTER — Ambulatory Visit: Admitting: Family Medicine

## 2024-11-04 ENCOUNTER — Encounter: Payer: Self-pay | Admitting: Family Medicine

## 2024-11-04 VITALS — BP 114/70 | HR 73 | Temp 98.3°F | Ht 70.87 in | Wt 189.4 lb

## 2024-11-04 DIAGNOSIS — Z7984 Long term (current) use of oral hypoglycemic drugs: Secondary | ICD-10-CM | POA: Diagnosis not present

## 2024-11-04 DIAGNOSIS — N184 Chronic kidney disease, stage 4 (severe): Secondary | ICD-10-CM | POA: Diagnosis not present

## 2024-11-04 DIAGNOSIS — R972 Elevated prostate specific antigen [PSA]: Secondary | ICD-10-CM | POA: Diagnosis not present

## 2024-11-04 DIAGNOSIS — I10 Essential (primary) hypertension: Secondary | ICD-10-CM | POA: Diagnosis not present

## 2024-11-04 DIAGNOSIS — Z0000 Encounter for general adult medical examination without abnormal findings: Secondary | ICD-10-CM | POA: Diagnosis not present

## 2024-11-04 DIAGNOSIS — E1122 Type 2 diabetes mellitus with diabetic chronic kidney disease: Secondary | ICD-10-CM | POA: Diagnosis not present

## 2024-11-04 LAB — LIPID PANEL
Cholesterol: 135 mg/dL (ref 28–200)
HDL: 45 mg/dL
LDL Cholesterol: 69 mg/dL (ref 10–99)
NonHDL: 89.78
Total CHOL/HDL Ratio: 3
Triglycerides: 105 mg/dL (ref 10.0–149.0)
VLDL: 21 mg/dL (ref 0.0–40.0)

## 2024-11-04 LAB — COMPREHENSIVE METABOLIC PANEL WITH GFR
ALT: 13 U/L (ref 3–53)
AST: 16 U/L (ref 5–37)
Albumin: 4.1 g/dL (ref 3.5–5.2)
Alkaline Phosphatase: 61 U/L (ref 39–117)
BUN: 26 mg/dL — ABNORMAL HIGH (ref 6–23)
CO2: 27 meq/L (ref 19–32)
Calcium: 9.4 mg/dL (ref 8.4–10.5)
Chloride: 104 meq/L (ref 96–112)
Creatinine, Ser: 1.95 mg/dL — ABNORMAL HIGH (ref 0.40–1.50)
GFR: 32.22 mL/min — ABNORMAL LOW
Glucose, Bld: 158 mg/dL — ABNORMAL HIGH (ref 70–99)
Potassium: 3.6 meq/L (ref 3.5–5.1)
Sodium: 141 meq/L (ref 135–145)
Total Bilirubin: 0.9 mg/dL (ref 0.2–1.2)
Total Protein: 7.2 g/dL (ref 6.0–8.3)

## 2024-11-04 LAB — CBC WITH DIFFERENTIAL/PLATELET
Basophils Absolute: 0 10*3/uL (ref 0.0–0.1)
Basophils Relative: 0.6 % (ref 0.0–3.0)
Eosinophils Absolute: 0.1 10*3/uL (ref 0.0–0.7)
Eosinophils Relative: 4.4 % (ref 0.0–5.0)
HCT: 43.6 % (ref 39.0–52.0)
Hemoglobin: 14.1 g/dL (ref 13.0–17.0)
Lymphocytes Relative: 35.8 % (ref 12.0–46.0)
Lymphs Abs: 1.2 10*3/uL (ref 0.7–4.0)
MCHC: 32.5 g/dL (ref 30.0–36.0)
MCV: 91.7 fl (ref 78.0–100.0)
Monocytes Absolute: 0.5 10*3/uL (ref 0.1–1.0)
Monocytes Relative: 14.1 % — ABNORMAL HIGH (ref 3.0–12.0)
Neutro Abs: 1.5 10*3/uL (ref 1.4–7.7)
Neutrophils Relative %: 45.1 % (ref 43.0–77.0)
Platelets: 167 10*3/uL (ref 150.0–400.0)
RBC: 4.75 Mil/uL (ref 4.22–5.81)
RDW: 13.5 % (ref 11.5–15.5)
WBC: 3.3 10*3/uL — ABNORMAL LOW (ref 4.0–10.5)

## 2024-11-04 LAB — HEMOGLOBIN A1C: Hgb A1c MFr Bld: 5.9 % (ref 4.6–6.5)

## 2024-11-04 LAB — MICROALBUMIN / CREATININE URINE RATIO
Creatinine,U: 103.4 mg/dL
Microalb Creat Ratio: 34.7 mg/g — ABNORMAL HIGH (ref 0.0–30.0)
Microalb, Ur: 3.6 mg/dL — ABNORMAL HIGH (ref 0.7–1.9)

## 2024-11-04 LAB — PSA, MEDICARE: PSA: 2.42 ng/mL (ref 0.10–4.00)

## 2024-11-07 ENCOUNTER — Ambulatory Visit: Payer: Self-pay | Admitting: Family Medicine

## 2024-11-28 ENCOUNTER — Ambulatory Visit

## 2025-07-12 ENCOUNTER — Ambulatory Visit
